# Patient Record
Sex: Male | Born: 1940 | Race: Black or African American | Hispanic: No | Marital: Married | Smoking: Former smoker
Health system: Southern US, Community
[De-identification: ages and names within clinical notes are randomized; demographics above are authoritative.]

## PROBLEM LIST (undated history)

## (undated) DIAGNOSIS — K219 Gastro-esophageal reflux disease without esophagitis: Secondary | ICD-10-CM

## (undated) DIAGNOSIS — I2510 Atherosclerotic heart disease of native coronary artery without angina pectoris: Secondary | ICD-10-CM

## (undated) DIAGNOSIS — I493 Ventricular premature depolarization: Secondary | ICD-10-CM

## (undated) DIAGNOSIS — M1990 Unspecified osteoarthritis, unspecified site: Secondary | ICD-10-CM

## (undated) DIAGNOSIS — I10 Essential (primary) hypertension: Secondary | ICD-10-CM

## (undated) DIAGNOSIS — C8300 Small cell B-cell lymphoma, unspecified site: Secondary | ICD-10-CM

## (undated) DIAGNOSIS — I779 Disorder of arteries and arterioles, unspecified: Secondary | ICD-10-CM

## (undated) DIAGNOSIS — J449 Chronic obstructive pulmonary disease, unspecified: Secondary | ICD-10-CM

## (undated) DIAGNOSIS — T7840XA Allergy, unspecified, initial encounter: Secondary | ICD-10-CM

## (undated) DIAGNOSIS — E785 Hyperlipidemia, unspecified: Secondary | ICD-10-CM

## (undated) HISTORY — DX: Chronic obstructive pulmonary disease, unspecified: J44.9

## (undated) HISTORY — DX: Essential (primary) hypertension: I10

## (undated) HISTORY — DX: Small cell B-cell lymphoma, unspecified site: C83.00

## (undated) HISTORY — DX: Allergy, unspecified, initial encounter: T78.40XA

## (undated) HISTORY — DX: Atherosclerotic heart disease of native coronary artery without angina pectoris: I25.10

## (undated) HISTORY — DX: Disorder of arteries and arterioles, unspecified: I77.9

## (undated) HISTORY — PX: CIRCUMCISION: SUR203

## (undated) HISTORY — DX: Hyperlipidemia, unspecified: E78.5

## (undated) HISTORY — DX: Unspecified osteoarthritis, unspecified site: M19.90

## (undated) HISTORY — DX: Ventricular premature depolarization: I49.3

## (undated) HISTORY — PX: HEMORRHOID SURGERY: SHX153

---

## 2000-08-27 ENCOUNTER — Ambulatory Visit (HOSPITAL_COMMUNITY): Admission: RE | Admit: 2000-08-27 | Discharge: 2000-08-27 | Payer: Self-pay | Admitting: Cardiology

## 2000-08-27 ENCOUNTER — Encounter: Payer: Self-pay | Admitting: Cardiology

## 2000-10-02 ENCOUNTER — Ambulatory Visit (HOSPITAL_COMMUNITY): Admission: RE | Admit: 2000-10-02 | Discharge: 2000-10-02 | Payer: Self-pay | Admitting: Endocrinology

## 2000-10-02 ENCOUNTER — Encounter: Payer: Self-pay | Admitting: Endocrinology

## 2003-04-21 ENCOUNTER — Ambulatory Visit (HOSPITAL_COMMUNITY): Admission: RE | Admit: 2003-04-21 | Discharge: 2003-04-21 | Payer: Self-pay | Admitting: Cardiology

## 2003-09-08 ENCOUNTER — Ambulatory Visit (HOSPITAL_COMMUNITY): Admission: RE | Admit: 2003-09-08 | Discharge: 2003-09-08 | Payer: Self-pay

## 2005-07-04 ENCOUNTER — Ambulatory Visit (HOSPITAL_COMMUNITY): Admission: RE | Admit: 2005-07-04 | Discharge: 2005-07-04 | Payer: Self-pay | Admitting: *Deleted

## 2010-04-13 ENCOUNTER — Ambulatory Visit (HOSPITAL_BASED_OUTPATIENT_CLINIC_OR_DEPARTMENT_OTHER)
Admission: RE | Admit: 2010-04-13 | Discharge: 2010-04-13 | Disposition: A | Discharge status: Home / Self Care | Payer: Medicare Other | Source: Ambulatory Visit | Attending: Orthopedic Surgery | Admitting: Orthopedic Surgery

## 2010-04-13 DIAGNOSIS — J45909 Unspecified asthma, uncomplicated: Secondary | ICD-10-CM | POA: Insufficient documentation

## 2010-04-13 DIAGNOSIS — Z01810 Encounter for preprocedural cardiovascular examination: Secondary | ICD-10-CM | POA: Insufficient documentation

## 2010-04-13 DIAGNOSIS — IMO0002 Reserved for concepts with insufficient information to code with codable children: Secondary | ICD-10-CM | POA: Insufficient documentation

## 2010-04-13 DIAGNOSIS — Z01812 Encounter for preprocedural laboratory examination: Secondary | ICD-10-CM | POA: Insufficient documentation

## 2010-04-13 DIAGNOSIS — X58XXXA Exposure to other specified factors, initial encounter: Secondary | ICD-10-CM | POA: Insufficient documentation

## 2010-04-13 LAB — POCT I-STAT 4, (NA,K, GLUC, HGB,HCT)
Glucose, Bld: 114 mg/dL — ABNORMAL HIGH (ref 70–99)
HCT: 42 % (ref 39.0–52.0)
Hemoglobin: 14.3 g/dL (ref 13.0–17.0)
Potassium: 3.9 mEq/L (ref 3.5–5.1)
Sodium: 139 mEq/L (ref 135–145)

## 2010-04-20 NOTE — Op Note (Signed)
  NAMECARNIE, BRUEMMER             ACCOUNT NO.:  [id number]  MEDICAL RECORD NO.:  [id number]          PATIENT TYPE:  LOCATION:                                 FACILITY:  PHYSICIAN:  Ollen Gross, M.D.         DATE OF BIRTH:  DATE OF PROCEDURE:  04/13/2010 DATE OF DISCHARGE:                              OPERATIVE REPORT   PREOPERATIVE DIAGNOSIS:  Left knee medial meniscal tear.  POSTOPERATIVE DIAGNOSIS:  Left knee medial meniscal tear.  PROCEDURE:  Left knee arthroscopy with meniscal debridement.  SURGEON:  Ollen Gross, MD  ASSISTANT:  None.  ANESTHESIA:  General.  ESTIMATED BLOOD LOSS:  Minimal.  DRAINS:  None.  COMPLICATIONS:  None.  CONDITION:  Stable to recovery.  BRIEF CLINICAL NOTE:  Mr. Joseph Moyer is a 70 year old male with several- month history of significant left knee pain and mechanical symptoms. Exam and history suggested medial meniscal tear, confirmed by MRI.  He presents now for arthroscopy and debridement.  PROCEDURE IN DETAIL:  After successful administration of general anesthetic, a tourniquet was placed high on the left thigh and left lower extremity was prepped and draped in usual sterile fashion. Standard superomedial inferolateral incisions were made, inflow cannula passed, superomedial camera passed inferolateral.  Arthroscopic visualization proceeds.  The undersurface of the patella and trochlea showed some grade 1 to 2 changes, but no full-thickness cartilage defects and no evidence of any unstable cartilage.  Mediolateral gutters were visualized and no loose bodies.  Flexion valgus force was applied to knee and the medial compartment __________.  There was evidence of a significant tear in the body and posterior horn of the medial meniscus. There was some low-grade chondromalacia in the medial femoral condyle and no full-thickness defects and no unstable cartilage.  Spinal needle was used to localize the inferomedial portal.  Small  incision was made and dilator placed.  The medial meniscal tear was then debrided back to stable base with baskets and 4.2 mm shaver.  It was then sealed off with the ArthroCare device.  It was found to be stable.  Intercondylar notch was then visualized.  The ACL was intact.  Lateral compartment was entered.  Minimal chondromalacia and no tears.  The arthroscopic equipment was then used to inspect the rest of the joint.  No other tears, defects or loose bodies were noted.  He comes in the room for removal of the inferior portals, which were closed with interrupted 4-0 nylon.  A 20 cc of 0.25% Marcaine with epi was injected through the inflow cannula and then that was removed and that portal closed with nylon.  Incisions were cleaned and dried and a bulky sterile dressing applied.  He was then awakened and transported to Recovery in stable condition.     Ollen Gross, M.D.     FA/MEDQ  D:  04/13/2010  T:  04/13/2010  Job:  [id number]  Electronically Signed by Ollen Gross M.D. on 04/20/2010 08:18:13 AM

## 2011-01-04 ENCOUNTER — Ambulatory Visit (INDEPENDENT_AMBULATORY_CARE_PROVIDER_SITE_OTHER): Payer: Medicare Other

## 2011-01-04 DIAGNOSIS — N529 Male erectile dysfunction, unspecified: Secondary | ICD-10-CM

## 2011-01-04 DIAGNOSIS — D1739 Benign lipomatous neoplasm of skin and subcutaneous tissue of other sites: Secondary | ICD-10-CM

## 2011-01-04 DIAGNOSIS — R5381 Other malaise: Secondary | ICD-10-CM

## 2013-01-17 ENCOUNTER — Encounter (INDEPENDENT_AMBULATORY_CARE_PROVIDER_SITE_OTHER): Payer: Medicare Other | Admitting: Ophthalmology

## 2013-01-17 DIAGNOSIS — H43819 Vitreous degeneration, unspecified eye: Secondary | ICD-10-CM

## 2013-01-17 DIAGNOSIS — H2510 Age-related nuclear cataract, unspecified eye: Secondary | ICD-10-CM

## 2013-01-17 DIAGNOSIS — H35039 Hypertensive retinopathy, unspecified eye: Secondary | ICD-10-CM

## 2013-01-17 DIAGNOSIS — I10 Essential (primary) hypertension: Secondary | ICD-10-CM

## 2013-01-17 DIAGNOSIS — H35379 Puckering of macula, unspecified eye: Secondary | ICD-10-CM

## 2013-02-28 ENCOUNTER — Encounter (INDEPENDENT_AMBULATORY_CARE_PROVIDER_SITE_OTHER): Payer: Commercial Managed Care - HMO | Admitting: Ophthalmology

## 2013-03-06 ENCOUNTER — Encounter (INDEPENDENT_AMBULATORY_CARE_PROVIDER_SITE_OTHER): Payer: Medicare HMO | Admitting: Ophthalmology

## 2013-03-06 DIAGNOSIS — H43819 Vitreous degeneration, unspecified eye: Secondary | ICD-10-CM

## 2013-03-06 DIAGNOSIS — H4310 Vitreous hemorrhage, unspecified eye: Secondary | ICD-10-CM

## 2013-03-06 DIAGNOSIS — H2510 Age-related nuclear cataract, unspecified eye: Secondary | ICD-10-CM

## 2013-03-06 DIAGNOSIS — I10 Essential (primary) hypertension: Secondary | ICD-10-CM

## 2013-03-06 DIAGNOSIS — H35039 Hypertensive retinopathy, unspecified eye: Secondary | ICD-10-CM

## 2013-03-06 DIAGNOSIS — H35379 Puckering of macula, unspecified eye: Secondary | ICD-10-CM

## 2013-04-17 ENCOUNTER — Encounter (INDEPENDENT_AMBULATORY_CARE_PROVIDER_SITE_OTHER): Payer: Medicare HMO | Admitting: Ophthalmology

## 2013-04-17 DIAGNOSIS — I10 Essential (primary) hypertension: Secondary | ICD-10-CM

## 2013-04-17 DIAGNOSIS — H2510 Age-related nuclear cataract, unspecified eye: Secondary | ICD-10-CM

## 2013-04-17 DIAGNOSIS — H43819 Vitreous degeneration, unspecified eye: Secondary | ICD-10-CM

## 2013-04-17 DIAGNOSIS — H35039 Hypertensive retinopathy, unspecified eye: Secondary | ICD-10-CM

## 2013-04-17 DIAGNOSIS — H35379 Puckering of macula, unspecified eye: Secondary | ICD-10-CM

## 2014-01-07 ENCOUNTER — Other Ambulatory Visit: Payer: Self-pay | Admitting: Nurse Practitioner

## 2014-01-07 ENCOUNTER — Ambulatory Visit
Admission: RE | Admit: 2014-01-07 | Discharge: 2014-01-07 | Disposition: A | Discharge status: Home / Self Care | Payer: Commercial Managed Care - HMO | Source: Ambulatory Visit | Attending: Nurse Practitioner | Admitting: Nurse Practitioner

## 2014-01-07 DIAGNOSIS — M79605 Pain in left leg: Secondary | ICD-10-CM

## 2014-01-07 DIAGNOSIS — M79604 Pain in right leg: Secondary | ICD-10-CM

## 2014-01-07 DIAGNOSIS — M545 Low back pain, unspecified: Secondary | ICD-10-CM

## 2014-04-22 ENCOUNTER — Ambulatory Visit (INDEPENDENT_AMBULATORY_CARE_PROVIDER_SITE_OTHER): Payer: Commercial Managed Care - HMO | Admitting: Ophthalmology

## 2014-05-08 DIAGNOSIS — E291 Testicular hypofunction: Secondary | ICD-10-CM | POA: Diagnosis not present

## 2014-05-08 DIAGNOSIS — Z1389 Encounter for screening for other disorder: Secondary | ICD-10-CM | POA: Diagnosis not present

## 2014-05-08 DIAGNOSIS — Z0000 Encounter for general adult medical examination without abnormal findings: Secondary | ICD-10-CM | POA: Diagnosis not present

## 2014-05-08 DIAGNOSIS — E780 Pure hypercholesterolemia: Secondary | ICD-10-CM | POA: Diagnosis not present

## 2014-05-08 DIAGNOSIS — I10 Essential (primary) hypertension: Secondary | ICD-10-CM | POA: Diagnosis not present

## 2014-05-08 DIAGNOSIS — Z125 Encounter for screening for malignant neoplasm of prostate: Secondary | ICD-10-CM | POA: Diagnosis not present

## 2014-06-15 ENCOUNTER — Ambulatory Visit (INDEPENDENT_AMBULATORY_CARE_PROVIDER_SITE_OTHER): Payer: Medicare HMO

## 2014-06-15 ENCOUNTER — Ambulatory Visit (INDEPENDENT_AMBULATORY_CARE_PROVIDER_SITE_OTHER): Payer: Commercial Managed Care - HMO | Admitting: Emergency Medicine

## 2014-06-15 VITALS — BP 142/88 | HR 81 | Temp 97.7°F | Resp 15 | Ht 71.25 in | Wt 207.0 lb

## 2014-06-15 DIAGNOSIS — M25561 Pain in right knee: Secondary | ICD-10-CM | POA: Diagnosis not present

## 2014-06-15 DIAGNOSIS — M25511 Pain in right shoulder: Secondary | ICD-10-CM | POA: Diagnosis not present

## 2014-06-15 DIAGNOSIS — M25512 Pain in left shoulder: Secondary | ICD-10-CM

## 2014-06-15 DIAGNOSIS — M25562 Pain in left knee: Secondary | ICD-10-CM

## 2014-06-15 MED ORDER — TRAMADOL HCL 50 MG PO TABS: 50.0000 mg | ORAL_TABLET | Freq: Three times a day (TID) | ORAL | Status: DC | PRN

## 2014-06-15 NOTE — Progress Notes (Addendum)
   Subjective:  This chart was scribed for Joseph Jordan, MD by [facility], medical scribe at [facility].The patient was seen in exam room 09 and the patient's care was started at 11:12 AM.   Patient ID: Joseph Moyer, male    DOB: 09-Nov-1940, 74 y.o.   MRN: [id number] Chief Complaint  Patient presents with  . Knee Pain    Bilateral. Pt states he has fluid in his knees and wants it drained   HPI HPI Comments: FERAS GARDELLA is a 74 y.o. male who presents to [facility] complaining of bilateral persistent knee pain. Pt states he is unable to stand for long periods of time due to the pain. He would like his knee drained since this was helpful in the past about 5-6 years ago. He has back pain that radiates down to his legs. Seeing Dr. Jimmye Norman a Chiropractor. On 3/14.2012 pt had a left knee operation for a torn meniscus. He also complains of right shoulder pain, this has been ongoing for "years".  Review of Systems  Musculoskeletal: Positive for back pain, joint swelling, arthralgias and gait problem.      Objective:  BP 142/88 mmHg  Pulse 81  Temp(Src) 97.7 F (36.5 C) (Oral)  Resp 15  Ht 5' 11.25" (1.81 m)  Wt 207 lb (93.895 kg)  BMI 28.66 kg/m2  SpO2 98% Physical Exam  Nursing note and vitals reviewed. CONSTITUTIONAL: Well developed/well nourished HEAD: Normocephalic/atraumatic EYES: EOMI/PERRL ENMT: Mucous membranes moist NECK: supple no meningeal signs SPINE/BACK:entire spine nontender CV: S1/S2 noted, no murmurs/rubs/gallops noted LUNGS: Lungs are clear to auscultation bilaterally, no apparent distress ABDOMEN: soft, nontender, no rebound or guarding, bowel sounds noted throughout abdomen GU:no cva tenderness NEURO: Pt is awake/alert/appropriate, moves all extremitiesx4.  No facial droop.   EXTREMITIES: pulses normal/equal, full ROM. Degenerative changes of both knees. Right shoulder full ROM no AC joint tenderness , weakness  to rotator cuff testing.  SKIN: warm, color normal PSYCH: no abnormalities of mood noted, alert and oriented to situation  [facility] reading (PRIMARY) by  Dr. Everlene Farrier right knee mild arthritic change left knee loss of the medial cartilage right shoulder normal left shoulder normal      Assessment & Plan:   We'll treat with glucosamine/chondroitin. This will be safer. He will continue with chiropractic care.I personally performed the services described in this documentation, which was scribed in my presence. The recorded information has been reviewed and is accurate.  Joseph Jordan, MD

## 2014-06-15 NOTE — Patient Instructions (Signed)

## 2014-07-06 DIAGNOSIS — M549 Dorsalgia, unspecified: Secondary | ICD-10-CM | POA: Diagnosis not present

## 2014-07-13 ENCOUNTER — Other Ambulatory Visit: Payer: Self-pay | Admitting: Internal Medicine

## 2014-07-13 DIAGNOSIS — M549 Dorsalgia, unspecified: Secondary | ICD-10-CM

## 2014-07-24 ENCOUNTER — Ambulatory Visit
Admission: RE | Admit: 2014-07-24 | Discharge: 2014-07-24 | Disposition: A | Discharge status: Home / Self Care | Payer: Commercial Managed Care - HMO | Source: Ambulatory Visit | Attending: Internal Medicine | Admitting: Internal Medicine

## 2014-07-24 DIAGNOSIS — M4806 Spinal stenosis, lumbar region: Secondary | ICD-10-CM | POA: Diagnosis not present

## 2014-07-24 DIAGNOSIS — M5126 Other intervertebral disc displacement, lumbar region: Secondary | ICD-10-CM | POA: Diagnosis not present

## 2014-07-24 DIAGNOSIS — M47817 Spondylosis without myelopathy or radiculopathy, lumbosacral region: Secondary | ICD-10-CM | POA: Diagnosis not present

## 2014-07-24 DIAGNOSIS — M549 Dorsalgia, unspecified: Secondary | ICD-10-CM

## 2014-07-24 DIAGNOSIS — M5137 Other intervertebral disc degeneration, lumbosacral region: Secondary | ICD-10-CM | POA: Diagnosis not present

## 2014-07-30 ENCOUNTER — Ambulatory Visit (INDEPENDENT_AMBULATORY_CARE_PROVIDER_SITE_OTHER): Payer: Commercial Managed Care - HMO

## 2014-07-30 ENCOUNTER — Inpatient Hospital Stay (HOSPITAL_COMMUNITY)
Admission: EM | Admit: 2014-07-30 | Discharge: 2014-08-03 | DRG: 392 | Disposition: A | Discharge status: Home / Self Care | Payer: Commercial Managed Care - HMO | Attending: Internal Medicine | Admitting: Internal Medicine

## 2014-07-30 ENCOUNTER — Encounter: Payer: Self-pay | Admitting: Emergency Medicine

## 2014-07-30 ENCOUNTER — Ambulatory Visit (INDEPENDENT_AMBULATORY_CARE_PROVIDER_SITE_OTHER): Payer: Commercial Managed Care - HMO | Admitting: Emergency Medicine

## 2014-07-30 ENCOUNTER — Emergency Department (HOSPITAL_COMMUNITY): Payer: Commercial Managed Care - HMO

## 2014-07-30 ENCOUNTER — Encounter (HOSPITAL_COMMUNITY): Payer: Self-pay | Admitting: Emergency Medicine

## 2014-07-30 VITALS — BP 144/88 | HR 68 | Temp 98.4°F | Resp 16 | Wt 199.8 lb

## 2014-07-30 DIAGNOSIS — Z6827 Body mass index (BMI) 27.0-27.9, adult: Secondary | ICD-10-CM | POA: Diagnosis not present

## 2014-07-30 DIAGNOSIS — IMO0001 Reserved for inherently not codable concepts without codable children: Secondary | ICD-10-CM | POA: Insufficient documentation

## 2014-07-30 DIAGNOSIS — K5720 Diverticulitis of large intestine with perforation and abscess without bleeding: Principal | ICD-10-CM | POA: Diagnosis present

## 2014-07-30 DIAGNOSIS — Z79899 Other long term (current) drug therapy: Secondary | ICD-10-CM | POA: Diagnosis not present

## 2014-07-30 DIAGNOSIS — K5900 Constipation, unspecified: Secondary | ICD-10-CM | POA: Diagnosis not present

## 2014-07-30 DIAGNOSIS — K5780 Diverticulitis of intestine, part unspecified, with perforation and abscess without bleeding: Secondary | ICD-10-CM | POA: Diagnosis not present

## 2014-07-30 DIAGNOSIS — R140 Abdominal distension (gaseous): Secondary | ICD-10-CM | POA: Diagnosis not present

## 2014-07-30 DIAGNOSIS — R030 Elevated blood-pressure reading, without diagnosis of hypertension: Secondary | ICD-10-CM

## 2014-07-30 DIAGNOSIS — E663 Overweight: Secondary | ICD-10-CM | POA: Diagnosis present

## 2014-07-30 DIAGNOSIS — G479 Sleep disorder, unspecified: Secondary | ICD-10-CM | POA: Diagnosis present

## 2014-07-30 DIAGNOSIS — E860 Dehydration: Secondary | ICD-10-CM | POA: Diagnosis present

## 2014-07-30 DIAGNOSIS — Z833 Family history of diabetes mellitus: Secondary | ICD-10-CM

## 2014-07-30 DIAGNOSIS — K921 Melena: Secondary | ICD-10-CM | POA: Diagnosis not present

## 2014-07-30 DIAGNOSIS — E871 Hypo-osmolality and hyponatremia: Secondary | ICD-10-CM | POA: Diagnosis not present

## 2014-07-30 DIAGNOSIS — R1012 Left upper quadrant pain: Secondary | ICD-10-CM

## 2014-07-30 DIAGNOSIS — E785 Hyperlipidemia, unspecified: Secondary | ICD-10-CM | POA: Diagnosis not present

## 2014-07-30 DIAGNOSIS — K5721 Diverticulitis of large intestine with perforation and abscess with bleeding: Secondary | ICD-10-CM | POA: Diagnosis not present

## 2014-07-30 DIAGNOSIS — Z7982 Long term (current) use of aspirin: Secondary | ICD-10-CM | POA: Diagnosis not present

## 2014-07-30 DIAGNOSIS — I10 Essential (primary) hypertension: Secondary | ICD-10-CM | POA: Diagnosis present

## 2014-07-30 DIAGNOSIS — K5792 Diverticulitis of intestine, part unspecified, without perforation or abscess without bleeding: Secondary | ICD-10-CM | POA: Diagnosis present

## 2014-07-30 DIAGNOSIS — K8020 Calculus of gallbladder without cholecystitis without obstruction: Secondary | ICD-10-CM | POA: Diagnosis not present

## 2014-07-30 DIAGNOSIS — R109 Unspecified abdominal pain: Secondary | ICD-10-CM | POA: Diagnosis present

## 2014-07-30 LAB — BASIC METABOLIC PANEL
Anion gap: 12 (ref 5–15)
BUN: 19 mg/dL (ref 6–20)
CO2: 27 mmol/L (ref 22–32)
CREATININE: 0.88 mg/dL (ref 0.61–1.24)
Calcium: 8.9 mg/dL (ref 8.9–10.3)
Chloride: 95 mmol/L — ABNORMAL LOW (ref 101–111)
GFR calc Af Amer: >60 mL/min (ref >60)
GFR calc non Af Amer: >60 mL/min (ref >60)
Glucose, Bld: 94 mg/dL (ref 65–99)
POTASSIUM: 3.9 mmol/L (ref 3.5–5.1)
Sodium: 134 mmol/L — ABNORMAL LOW (ref 135–145)

## 2014-07-30 LAB — CBC
HCT: 34.5 % — ABNORMAL LOW (ref 39.0–52.0)
Hemoglobin: 11.4 g/dL — ABNORMAL LOW (ref 13.0–17.0)
MCH: 28.1 pg (ref 26.0–34.0)
MCHC: 33 g/dL (ref 30.0–36.0)
MCV: 85.2 fL (ref 78.0–100.0)
PLATELETS: 196 10*3/uL (ref 150–400)
RBC: 4.05 MIL/uL — AB (ref 4.22–5.81)
RDW: 15.6 % — ABNORMAL HIGH (ref 11.5–15.5)
WBC: 8 10*3/uL (ref 4.0–10.5)

## 2014-07-30 LAB — HEPATIC FUNCTION PANEL
ALBUMIN: 3.6 g/dL (ref 3.5–5.0)
ALT: 95 U/L — AB (ref 17–63)
AST: 46 U/L — ABNORMAL HIGH (ref 15–41)
Alkaline Phosphatase: 128 U/L — ABNORMAL HIGH (ref 38–126)
BILIRUBIN INDIRECT: 0.5 mg/dL (ref 0.3–0.9)
Bilirubin, Direct: 0.1 mg/dL (ref 0.1–0.5)
Total Bilirubin: 0.6 mg/dL (ref 0.3–1.2)
Total Protein: 8.9 g/dL — ABNORMAL HIGH (ref 6.5–8.1)

## 2014-07-30 LAB — URINALYSIS, ROUTINE W REFLEX MICROSCOPIC
Glucose, UA: NEGATIVE mg/dL
Hgb urine dipstick: NEGATIVE
Ketones, ur: NEGATIVE mg/dL
Leukocytes, UA: NEGATIVE
NITRITE: NEGATIVE
Protein, ur: 30 mg/dL — AB
Specific Gravity, Urine: 1.028 (ref 1.005–1.030)
Urobilinogen, UA: 1 mg/dL (ref 0.0–1.0)
pH: 6 (ref 5.0–8.0)

## 2014-07-30 LAB — LIPASE, BLOOD: LIPASE: 15 U/L — AB (ref 22–51)

## 2014-07-30 LAB — POCT CBC
Granulocyte percent: 75.6 %G (ref 37–80)
HCT, POC: 38.9 % — AB (ref 43.5–53.7)
Hemoglobin: 12.5 g/dL — AB (ref 14.1–18.1)
LYMPH, POC: 2 (ref 0.6–3.4)
MCH: 27.3 pg (ref 27–31.2)
MCHC: 32.1 g/dL (ref 31.8–35.4)
MCV: 85.2 fL (ref 80–97)
MID (cbc): 0.3 (ref 0–0.9)
MPV: 7.3 fL (ref 0–99.8)
PLATELET COUNT, POC: 242 10*3/uL (ref 142–424)
POC Granulocyte: 7.1 — AB (ref 2–6.9)
POC LYMPH %: 21.5 % (ref 10–50)
POC MID %: 2.9 %M (ref 0–12)
RBC: 4.56 M/uL — AB (ref 4.69–6.13)
RDW, POC: 16.5 %
WBC: 9.4 10*3/uL (ref 4.6–10.2)

## 2014-07-30 LAB — CREATININE, SERUM
CREATININE: 0.83 mg/dL (ref 0.61–1.24)
GFR calc Af Amer: >60 mL/min (ref >60)
GFR calc non Af Amer: >60 mL/min (ref >60)

## 2014-07-30 LAB — POC HEMOCCULT BLD/STL (OFFICE/1-CARD/DIAGNOSTIC)
FECAL OCCULT BLD: NEGATIVE
OCCULT BLOOD DATE: 6302016

## 2014-07-30 LAB — URINE MICROSCOPIC-ADD ON

## 2014-07-30 MED ORDER — MORPHINE SULFATE 2 MG/ML IJ SOLN
1.0000 mg | INTRAMUSCULAR | Status: DC | PRN
  Administered 2014-08-01 – 2014-08-02 (×2): 1 mg via INTRAVENOUS
  Filled 2014-07-30 (×2): qty 1

## 2014-07-30 MED ORDER — ACETAMINOPHEN 325 MG PO TABS
650.0000 mg | ORAL_TABLET | Freq: Four times a day (QID) | ORAL | Status: DC | PRN
Start: 2014-07-30 — End: 2014-08-03

## 2014-07-30 MED ORDER — ACETAMINOPHEN 650 MG RE SUPP
650.0000 mg | Freq: Four times a day (QID) | RECTAL | Status: DC | PRN
Start: 2014-07-30 — End: 2014-08-03

## 2014-07-30 MED ORDER — SIMVASTATIN 20 MG PO TABS
20.0000 mg | ORAL_TABLET | Freq: Every day | ORAL | Status: DC
  Administered 2014-07-31 – 2014-08-03 (×4): 20 mg via ORAL
  Filled 2014-07-30 (×4): qty 1

## 2014-07-30 MED ORDER — ONDANSETRON HCL 4 MG/2ML IJ SOLN: 4.0000 mg | Freq: Four times a day (QID) | INTRAMUSCULAR | Status: DC | PRN

## 2014-07-30 MED ORDER — MORPHINE SULFATE 4 MG/ML IJ SOLN
4.0000 mg | Freq: Once | INTRAMUSCULAR | Status: AC
  Administered 2014-07-30: 4 mg via INTRAVENOUS
  Filled 2014-07-30: qty 1

## 2014-07-30 MED ORDER — ONDANSETRON HCL 4 MG PO TABS: 4.0000 mg | ORAL_TABLET | Freq: Four times a day (QID) | ORAL | Status: DC | PRN

## 2014-07-30 MED ORDER — IOHEXOL 300 MG/ML  SOLN
25.0000 mL | Freq: Once | INTRAMUSCULAR | Status: AC | PRN
  Administered 2014-07-30: 25 mL via ORAL

## 2014-07-30 MED ORDER — ONDANSETRON HCL 4 MG/2ML IJ SOLN
4.0000 mg | Freq: Once | INTRAMUSCULAR | Status: AC
  Administered 2014-07-30: 4 mg via INTRAVENOUS
  Filled 2014-07-30: qty 2

## 2014-07-30 MED ORDER — ASPIRIN EC 81 MG PO TBEC
81.0000 mg | DELAYED_RELEASE_TABLET | Freq: Every day | ORAL | Status: DC
  Administered 2014-07-31 – 2014-08-03 (×4): 81 mg via ORAL
  Filled 2014-07-30 (×4): qty 1

## 2014-07-30 MED ORDER — CIPROFLOXACIN IN D5W 400 MG/200ML IV SOLN
400.0000 mg | Freq: Two times a day (BID) | INTRAVENOUS | Status: DC
  Administered 2014-07-31 – 2014-08-01 (×4): 400 mg via INTRAVENOUS
  Filled 2014-07-30 (×4): qty 200

## 2014-07-30 MED ORDER — SODIUM CHLORIDE 0.9 % IV SOLN: INTRAVENOUS | Status: DC

## 2014-07-30 MED ORDER — LOSARTAN POTASSIUM 25 MG PO TABS
25.0000 mg | ORAL_TABLET | Freq: Every day | ORAL | Status: DC
  Administered 2014-07-31 – 2014-08-03 (×4): 25 mg via ORAL
  Filled 2014-07-30 (×4): qty 1

## 2014-07-30 MED ORDER — IOHEXOL 300 MG/ML  SOLN
100.0000 mL | Freq: Once | INTRAMUSCULAR | Status: AC | PRN
  Administered 2014-07-30: 100 mL via INTRAVENOUS

## 2014-07-30 MED ORDER — DEXTROSE 5 % IV SOLN
1.0000 g | Freq: Once | INTRAVENOUS | Status: AC
  Administered 2014-07-30: 1 g via INTRAVENOUS
  Filled 2014-07-30: qty 10

## 2014-07-30 MED ORDER — METRONIDAZOLE IN NACL 5-0.79 MG/ML-% IV SOLN
500.0000 mg | Freq: Once | INTRAVENOUS | Status: DC
  Administered 2014-07-30: 500 mg via INTRAVENOUS
  Filled 2014-07-30: qty 100

## 2014-07-30 MED ORDER — METRONIDAZOLE IN NACL 5-0.79 MG/ML-% IV SOLN
500.0000 mg | Freq: Three times a day (TID) | INTRAVENOUS | Status: DC
Start: 2014-07-31 — End: 2014-08-01
  Administered 2014-07-31 – 2014-08-01 (×4): 500 mg via INTRAVENOUS
  Filled 2014-07-30 (×5): qty 100

## 2014-07-30 MED ORDER — HEPARIN SODIUM (PORCINE) 5000 UNIT/ML IJ SOLN
5000.0000 [IU] | Freq: Three times a day (TID) | INTRAMUSCULAR | Status: DC
  Administered 2014-07-31 – 2014-08-03 (×10): 5000 [IU] via SUBCUTANEOUS
  Filled 2014-07-30 (×13): qty 1

## 2014-07-30 NOTE — ED Provider Notes (Signed)
CSN: [id number]     Arrival date & time 07/30/14  1444 History   First MD Initiated Contact with Patient 07/30/14 1610     Chief Complaint  Patient presents with  . Abdominal Pain     Patient is a 74 y.o. male presenting with abdominal pain. The history is provided by the patient.  Abdominal Pain Pain location:  LLQ Pain quality: aching   Pain severity:  Moderate Onset quality:  Gradual Duration:  5 days Timing:  Intermittent Progression:  Worsening Chronicity:  New Relieved by:  Nothing Worsened by:  Palpation Associated symptoms: no dysuria, no fever and no vomiting   Risk factors: has not had multiple surgeries   pt sent by PCP for evaluation and possible CT imaging   Past Medical History  Diagnosis Date  . Allergy   . Arthritis    History reviewed. No pertinent past surgical history. Family History  Problem Relation Age of Onset  . Diabetes Mother   . Diabetes Sister   . Diabetes Brother    History  Substance Use Topics  . Smoking status: Never Smoker   . Smokeless tobacco: Not on file  . Alcohol Use: Not on file    Review of Systems  Constitutional: Negative for fever.  Gastrointestinal: Positive for abdominal pain. Negative for vomiting.  Genitourinary: Negative for dysuria.  All other systems reviewed and are negative.     Allergies  Review of patient's allergies indicates no known allergies.  Home Medications   Prior to Admission medications   Medication Sig Start Date End Date Taking? Authorizing Provider  aspirin 81 MG tablet Take 81 mg by mouth daily.   Yes Historical Provider, MD  losartan (COZAAR) 25 MG tablet Take 25 mg by mouth daily. 06/18/14  Yes Historical Provider, MD  simvastatin (ZOCOR) 20 MG tablet Take 20 mg by mouth daily.   Yes Historical Provider, MD  traMADol (ULTRAM) 50 MG tablet Take 1 tablet (50 mg total) by mouth every 8 (eight) hours as needed. Patient not taking: Reported on 07/30/2014 06/15/14   Darlyne Russian, MD   BP  164/88 mmHg  Pulse 103  Temp(Src) 97.9 F (36.6 C) (Oral)  Resp 19  SpO2 99% Physical Exam CONSTITUTIONAL: Well developed/well nourished HEAD: Normocephalic/atraumatic EYES: EOMI/PERRL ENMT: Mucous membranes moist NECK: supple no meningeal signs SPINE/BACK:entire spine nontender CV: S1/S2 noted, no murmurs/rubs/gallops noted LUNGS: Lungs are clear to auscultation bilaterally, no apparent distress ABDOMEN: soft, mild LLQ tenderness, no rebound or guarding, bowel sounds noted throughout abdomen, no RUQ tenderness GU:no cva tenderness NEURO: Pt is awake/alert/appropriate, moves all extremitiesx4.  No facial droop.   EXTREMITIES: pulses normal/equal, full ROM SKIN: warm, color normal PSYCH: no abnormalities of mood noted, alert and oriented to situation  ED Course  Procedures  4:41 PM Pt sent by PCP For evaluation of abd pain Xray today showed possible gallstone/kidney stone on right.  However pain on left side He already had negative stool hemoccult and CBC Will add on urine/labs 8:31 PM Discussed CT findings with dr Lucia Gaskins Admit to medicine Rocephin/flagyl ordered per previous surgery recs D/w dr Humphrey Rolls will admit to medicine BP 149/78 mmHg  Pulse 97  Temp(Src) 97.9 F (36.6 C) (Oral)  Resp 18  SpO2 97%   Labs Review Labs Reviewed  BASIC METABOLIC PANEL - Abnormal; Notable for the following:    Sodium 134 (*)    Chloride 95 (*)    All other components within normal limits  URINALYSIS, ROUTINE W REFLEX  MICROSCOPIC (NOT AT [facility]) - Abnormal; Notable for the following:    Color, Urine AMBER (*)    Bilirubin Urine SMALL (*)    Protein, ur 30 (*)    All other components within normal limits  HEPATIC FUNCTION PANEL - Abnormal; Notable for the following:    Total Protein 8.9 (*)    AST 46 (*)    ALT 95 (*)    Alkaline Phosphatase 128 (*)    All other components within normal limits  LIPASE, BLOOD - Abnormal; Notable for the following:    Lipase 15 (*)    All other  components within normal limits  URINE MICROSCOPIC-ADD ON    Imaging Review Ct Abdomen Pelvis W Contrast  07/30/2014   CLINICAL DATA:  Abdominal pain for 5 days with bloating  EXAM: CT ABDOMEN AND PELVIS WITH CONTRAST  TECHNIQUE: Multidetector CT imaging of the abdomen and pelvis was performed using the standard protocol following bolus administration of intravenous contrast.  CONTRAST:  1mL OMNIPAQUE IOHEXOL 300 MG/ML SOLN, 173mL OMNIPAQUE IOHEXOL 300 MG/ML SOLN  COMPARISON:  07/30/14  FINDINGS: Lung bases are well aerated with evidence of mild right basilar infiltrate. No sizable effusion is seen.  The liver, spleen, adrenal glands and pancreas are within normal limits. The gallbladder is well distended and demonstrates a single dependent gallstone without complicating factors. Kidneys are well visualized bilaterally without renal calculi or obstructive changes. Ureters are within normal limits to the urinary bladder which is partially distended.  There are changes of diverticulosis throughout the colon. In the mid descending colon there is evidence of diverticulitis with inflammation of adjacent small bowel loops. There are at least 2 air-fluid collections identified in the left lateral abdomen. One is best seen on image number 55 of series 2 measuring 2.6 x 3.3 cm consistent with a focal abscess. On coronal images it has a somewhat bilobed appearance draped over the adjacent colon. The craniocaudad measurement is approximately 8 cm. A second is noted along the mesenteric border of the: Measuring 3.1 by 2.6 cm. This is best seen on image number 64 of series  The appendix is within normal limits. No other focal abnormality is seen. 2. No significant free air is seen. No other definitive abscess is noted.  IMPRESSION: Diverticulitis with 2 pericolonic abscesses as described. Although not mentioned in the body of the report there are some areas of fluid attenuation within the wall of the colon which may  represent some intramural abscess formation.  Cholelithiasis without complicating factors.  Right basilar infiltrate.   Electronically Signed   By: Inez Catalina M.D.   On: 07/30/2014 19:46   Dg Abd Acute W/chest  07/30/2014   CLINICAL DATA:  Abdominal pain  EXAM: DG ABDOMEN ACUTE W/ 1V CHEST  COMPARISON:  Lumbar spine radiographs 01/07/2014 at [place] at [place]: There is no evidence of dilated bowel loops or free intraperitoneal air. No radiopaque calculi or other significant radiographic abnormality is seen. Heart size and mediastinal contours are within normal limits. Both lungs are clear. Probable gallstone reidentified over the right upper quadrant. Renal calculus could appear similar.  IMPRESSION: Negative abdominal radiographs.  No acute cardiopulmonary disease.   Electronically Signed   By: Conchita Paris M.D.   On: 07/30/2014 14:11     EKG Interpretation None     Medications  cefTRIAXone (ROCEPHIN) 1 g in dextrose 5 % 50 mL IVPB (not administered)  metroNIDAZOLE (FLAGYL) IVPB 500 mg (not administered)  morphine 4 MG/ML injection 4 mg (4 mg Intravenous Given 07/30/14 1703)  ondansetron (ZOFRAN) injection 4 mg (4 mg Intravenous Given 07/30/14 1703)  iohexol (OMNIPAQUE) 300 MG/ML solution 25 mL (25 mLs Oral Contrast Given 07/30/14 1721)  iohexol (OMNIPAQUE) 300 MG/ML solution 100 mL (100 mLs Intravenous Contrast Given 07/30/14 1925)    MDM   Final diagnoses:  Acute diverticulitis    Nursing notes including past medical history and social history reviewed and considered in documentation Labs/vital reviewed myself and considered during evaluation     Ripley Fraise, MD 07/30/14 2032

## 2014-07-30 NOTE — ED Notes (Signed)
Pt referred here for CT scan or ultrasound of abdomen for LLQ pain. Has had decreased appetite and constipation since Friday. Some alleviation of constipation Saturday with Magnesium Citrate. Referring MD thinks there could be gallstones. Did X-ray in office and saw what could be possible stones. Denies N/V/D.

## 2014-07-30 NOTE — H&P (Signed)
[facility] History and Physical  ROCHELL MABIE HAL:[id number] DOB: 06/13/1940 DOA: 07/30/2014  Referring physician: Ripley Fraise, MD PCP: Kandice Hams, MD   Chief Complaint: Abdominal Pain  HPI: URIYAH Moyer is a 74 y.o. male with history of HTN Hyperlipidemia presents with abdominal pain. Patient states that the pain has been going on for about 5-6 days. Patient has been noted to be constipated. He has no nausea and no vomiting. He denies diarrhea. Patient states that he has no blood in his stools. Patient denies fevers or chills at this time. Patient states that he has no chest pain. The abdominal pain is in the lower quadrants and does not radiate any where else. He has noted the pain to be intermittent.   Review of Systems:  Systems reviewed and unremarkable other than noted in HPI   Past Medical History  Diagnosis Date  . Allergy   . Arthritis    History reviewed. No pertinent past surgical history. Social History:  reports that he has never smoked. He does not have any smokeless tobacco history on file. His alcohol and drug histories are not on file.  No Known Allergies  Family History  Problem Relation Age of Onset  . Diabetes Mother   . Diabetes Sister   . Diabetes Brother      Prior to Admission medications   Medication Sig Start Date End Date Taking? Authorizing Provider  aspirin 81 MG tablet Take 81 mg by mouth daily.   Yes Historical Provider, MD  losartan (COZAAR) 25 MG tablet Take 25 mg by mouth daily. 06/18/14  Yes Historical Provider, MD  simvastatin (ZOCOR) 20 MG tablet Take 20 mg by mouth daily.   Yes Historical Provider, MD  traMADol (ULTRAM) 50 MG tablet Take 1 tablet (50 mg total) by mouth every 8 (eight) hours as needed. Patient not taking: Reported on 07/30/2014 06/15/14   Darlyne Russian, MD   Physical Exam: Filed Vitals:   07/30/14 1458 07/30/14 1844  BP: 164/88 149/78  Pulse: 103 97  Temp: 97.9 F (36.6 C)   TempSrc: Oral     Resp: 19 18  SpO2: 99% 97%    Wt Readings from Last 3 Encounters:  07/30/14 90.629 kg (199 lb 12.8 oz)  06/15/14 93.895 kg (207 lb)    General:  Appears calm and comfortable Eyes: PERRL, normal lids, irises & conjunctiva ENT: grossly normal hearing, lips & tongue Neck: no LAD, masses or thyromegaly Cardiovascular: RRR, no m/r/g. No LE edema. Respiratory: CTA bilaterally, no w/r/r. Normal respiratory effort. Abdomen: soft, +tenderness in the lower quadrants. No rebound noted Skin: no rash or induration seen on limited exam Musculoskeletal: grossly normal tone BUE/BLE Psychiatric: grossly normal mood and affect Neurologic: grossly non-focal.          Labs on Admission:  Basic Metabolic Panel:  Recent Labs Lab 07/30/14 1640  NA 134*  K 3.9  CL 95*  CO2 27  GLUCOSE 94  BUN 19  CREATININE 0.88  CALCIUM 8.9   Liver Function Tests:  Recent Labs Lab 07/30/14 1640  AST 46*  ALT 95*  ALKPHOS 128*  BILITOT 0.6  PROT 8.9*  ALBUMIN 3.6    Recent Labs Lab 07/30/14 1640  LIPASE 15*   No results for input(s): AMMONIA in the last 168 hours. CBC:  Recent Labs Lab 07/30/14 1403  WBC 9.4  HGB 12.5*  HCT 38.9*  MCV 85.2   Cardiac Enzymes: No results for input(s): CKTOTAL, CKMB, CKMBINDEX, TROPONINI in  the last 168 hours.  BNP (last 3 results) No results for input(s): BNP in the last 8760 hours.  ProBNP (last 3 results) No results for input(s): PROBNP in the last 8760 hours.  CBG: No results for input(s): GLUCAP in the last 168 hours.  Radiological Exams on Admission: Ct Abdomen Pelvis W Contrast  07/30/2014   CLINICAL DATA:  Abdominal pain for 5 days with bloating  EXAM: CT ABDOMEN AND PELVIS WITH CONTRAST  TECHNIQUE: Multidetector CT imaging of the abdomen and pelvis was performed using the standard protocol following bolus administration of intravenous contrast.  CONTRAST:  28mL OMNIPAQUE IOHEXOL 300 MG/ML SOLN, 160mL OMNIPAQUE IOHEXOL 300 MG/ML SOLN   COMPARISON:  07/30/14  FINDINGS: Lung bases are well aerated with evidence of mild right basilar infiltrate. No sizable effusion is seen.  The liver, spleen, adrenal glands and pancreas are within normal limits. The gallbladder is well distended and demonstrates a single dependent gallstone without complicating factors. Kidneys are well visualized bilaterally without renal calculi or obstructive changes. Ureters are within normal limits to the urinary bladder which is partially distended.  There are changes of diverticulosis throughout the colon. In the mid descending colon there is evidence of diverticulitis with inflammation of adjacent small bowel loops. There are at least 2 air-fluid collections identified in the left lateral abdomen. One is best seen on image number 55 of series 2 measuring 2.6 x 3.3 cm consistent with a focal abscess. On coronal images it has a somewhat bilobed appearance draped over the adjacent colon. The craniocaudad measurement is approximately 8 cm. A second is noted along the mesenteric border of the: Measuring 3.1 by 2.6 cm. This is best seen on image number 64 of series  The appendix is within normal limits. No other focal abnormality is seen. 2. No significant free air is seen. No other definitive abscess is noted.  IMPRESSION: Diverticulitis with 2 pericolonic abscesses as described. Although not mentioned in the body of the report there are some areas of fluid attenuation within the wall of the colon which may represent some intramural abscess formation.  Cholelithiasis without complicating factors.  Right basilar infiltrate.   Electronically Signed   By: Inez Catalina M.D.   On: 07/30/2014 19:46   Dg Abd Acute W/chest  07/30/2014   CLINICAL DATA:  Abdominal pain  EXAM: DG ABDOMEN ACUTE W/ 1V CHEST  COMPARISON:  Lumbar spine radiographs 01/07/2014 at [place] at [place]: There is no evidence of dilated bowel loops or free intraperitoneal air. No  radiopaque calculi or other significant radiographic abnormality is seen. Heart size and mediastinal contours are within normal limits. Both lungs are clear. Probable gallstone reidentified over the right upper quadrant. Renal calculus could appear similar.  IMPRESSION: Negative abdominal radiographs.  No acute cardiopulmonary disease.   Electronically Signed   By: Conchita Paris M.D.   On: 07/30/2014 14:11      Assessment/Plan Active Problems:   Acute diverticulitis   Hyponatremia   Hyperlipidemia   HTN (hypertension)   1. Acute diverticulitis -will be admitted for observation -will start on IV cipro and Flagyl -surgery will see patient in am  2. Cholelithiasis -will monitor follow up as outpatient  3. Hyperlipidemia -on statins  4. Hypertension -will continue with losartan  5. Hyponatremia -repeat labs in am -started on NS in ER    Code Status: Full Code (must indicate code status--if unknown or must be presumed, indicate so) DVT Prophylaxis:Heparin Family Communication: None (indicate  person spoken with, if applicable, with phone number if by telephone) Disposition Plan: Home (indicate anticipated LOS)  Time spent: 56min  KHAN,SAADAT A [facility] Pager [phone]

## 2014-07-30 NOTE — ED Notes (Signed)
Pt being sent over by Dr Osvaldo Angst. C/o abd pain x5 days, bloating, afebrile. Xray shows calcification in RUQ

## 2014-07-30 NOTE — Progress Notes (Addendum)
Subjective:  This chart was scribed for Arlyss Queen, MD by Leandra Kern, Medical Scribe. This patient was seen in Room 21 and the patient's care was started at 1:17 PM.   Patient ID: Joseph Moyer, male    DOB: February 08, 1940, 74 y.o.   MRN: [id number]  HPI HPI Comments: Joseph Moyer is a 74 y.o. male who presents to [facility] complaining of abdominal pain, gradual onset 6 days ago.  He notes that he had a very big meal to eat, in which he was presented with abdominal pain right after. Pt has associated symptoms of constipation, and decrease in appetite as a result of the pain. He states that he took pepto bismol, which he did not find relief from. He then tried another over the counter medication magcitrate, and after he took this medication he had a bowel movement the next day, his stool very dark in color. Pt denies vomiting. Pt has not had any abdominal surgeries previously. Today, he notes that he had a small bowel movement.    Pt's next colonoscopy appointment is in about a year.    Review of Systems  Constitutional: Positive for appetite change (decrease in appetite).  Gastrointestinal: Positive for abdominal pain and constipation. Negative for vomiting.       Objective:   Physical Exam  Constitutional: He is oriented to person, place, and time. He appears well-developed and well-nourished. No distress.  HENT:  Head: Normocephalic and atraumatic.  Eyes: EOM are normal. Pupils are equal, round, and reactive to light.  Neck: Neck supple.  Cardiovascular: Normal rate.   Pulmonary/Chest: Effort normal.  Abdominal: He exhibits distension.  Decreased bowel sounds. Tenderness in the left upper and left mid abdomen.  Rectal exam was normal Brown stool from acids.   Neurological: He is alert and oriented to person, place, and time. No cranial nerve deficit.  Skin: Skin is warm and dry.  Psychiatric: He has a normal mood and affect. His behavior is  normal.  Nursing note and vitals reviewed. [facility] reading (PRIMARY) by  Dr. Everlene Farrier there is a 1 cm calcification in the right upper abdomen and air-fluid level in the right lower abdomen there is no free air lung fields appear clear with possible increased markings right lower lobe Results for orders placed or performed in visit on 07/30/14  POCT CBC  Result Value Ref Range   WBC 9.4 4.6 - 10.2 K/uL   Lymph, poc 2.0 0.6 - 3.4   POC LYMPH PERCENT 21.5 10 - 50 %L   MID (cbc) 0.3 0 - 0.9   POC MID % 2.9 0 - 12 %M   POC Granulocyte 7.1 (A) 2 - 6.9   Granulocyte percent 75.6 37 - 80 %G   RBC 4.56 (A) 4.69 - 6.13 M/uL   Hemoglobin 12.5 (A) 14.1 - 18.1 g/dL   HCT, POC 38.9 (A) 43.5 - 53.7 %   MCV 85.2 80 - 97 fL   MCH, POC 27.3 27 - 31.2 pg   MCHC 32.1 31.8 - 35.4 g/dL   RDW, POC 16.5 %   Platelet Count, POC 242 142 - 424 K/uL   MPV 7.3 0 - 99.8 fL       Assessment & Plan:  Patient presents with upper abdominal discomfort bloating nausea inability to eat. He does have a calcification in the right upper abdomen which could be either kidney or gallbladder related. He is sent to the emergency room for evaluation  for further imaging studies prefers her evaluation of his abdominal pain. White count was normal he was anemic with a hemoglobin of 12.5. He was unable to get a urine specimen in our office.I personally performed the services described in this documentation, which was scribed in my presence. The recorded information has been reviewed and is accurate.  Nena Jordan, MD

## 2014-07-30 NOTE — Consult Note (Signed)
Re:   Joseph Moyer DOB:   06-14-1940 MRN:   [id number]  ASSESSMENT AND PLAN: 1.  Diverticulitis with 2 pericolonic abscesses in distal left colon.  These are proximal abscess, more in the distal left colon than sigmoid colon.  Plan:  IVF, antibiotics, NPO and follow up exams and labs.  Hopefully this will resolve without urgent surgery.  This is his first attack of documented diverticulitis.  2.  Cholelithiasis, asymptomatic 3.  Right basilar infiltrate - by CT 4.  HTN x 10 years.  Chief Complaint  Patient presents with  . Abdominal Pain   REFERRING PHYSICIAN: Kandice Hams, MD  HISTORY OF PRESENT ILLNESS: Joseph Moyer is a 74 y.o. (DOB: 03-Sep-1940)  AA  male whose primary care physician is [name],[name], MD/     He came to the [facility] with abdominal pain.  He originally went to [facility] and saw Dr. Chauncey Cruel. Everlene Farrier.  He described pain that occurred over 6 days.  The pain started Friday, 6/24.  He took some Pepto Bismol.  He took some Gas X for bloating.  On 6/26, he took Mg Citrate for his bowels and had a BM on Monday.  He had a black BM, possibly due to the [place].  Because of his worsening pain, he saw Dr. Everlene Farrier today at [facility].  He has not GI history.  He has had no abdominal surgery.  He thinks that Dr. Lajoyce Corners did a colonoscopy on him in 2007.  His next colonoscopy is 2017.   Abdominal CT scan - 07/30/2014 - Diverticulitis with 2 pericolonic abscesses. Although not mentioned in the body of the report there are some areas of fluid attenuation within the wall of the colon which may represent some intramural abscess formation.  Cholelithiasis without complicating factors.  Right basilar infiltrate.   Past Medical History  Diagnosis Date  . Allergy   . Arthritis      History reviewed. No pertinent past surgical history.    Current Facility-Administered Medications  Medication Dose Route Frequency Provider Last Rate Last Dose  . 0.9 %  sodium  chloride infusion   Intravenous Continuous Allyne Gee, MD      . acetaminophen (TYLENOL) tablet 650 mg  650 mg Oral Q6H PRN Allyne Gee, MD       Or  . acetaminophen (TYLENOL) suppository 650 mg  650 mg Rectal Q6H PRN Allyne Gee, MD      . Derrill Memo ON 07/31/2014] aspirin EC tablet 81 mg  81 mg Oral Daily Allyne Gee, MD      . ciprofloxacin (CIPRO) IVPB 400 mg  400 mg Intravenous Q12H Dorrene German, [facility]      . [START ON 07/31/2014] heparin injection 5,000 Units  5,000 Units Subcutaneous 3 times per day Allyne Gee, MD      . Derrill Memo ON 07/31/2014] losartan (COZAAR) tablet 25 mg  25 mg Oral Daily Allyne Gee, MD      . Derrill Memo ON 07/31/2014] metroNIDAZOLE (FLAGYL) IVPB 500 mg  500 mg Intravenous 3 times per day Allyne Gee, MD      . morphine 2 MG/ML injection 1 mg  1 mg Intravenous Q3H PRN Allyne Gee, MD      . ondansetron (ZOFRAN) tablet 4 mg  4 mg Oral Q6H PRN Allyne Gee, MD       Or  . ondansetron (ZOFRAN) injection 4 mg  4 mg Intravenous Q6H  PRN Allyne Gee, MD      . Derrill Memo ON 07/31/2014] simvastatin (ZOCOR) tablet 20 mg  20 mg Oral Daily Allyne Gee, MD         No Known Allergies  REVIEW OF SYSTEMS: Skin:  No history of rash.  No history of abnormal moles. Infection:  No history of hepatitis or HIV.  No history of MRSA. Neurologic:  No history of stroke.  No history of seizure.  No history of headaches. Cardiac:  HTN x 10 years. Pulmonary:  Does not smoke cigarettes.  No asthma or bronchitis.  No OSA/CPAP.  Endocrine:  No diabetes. No thyroid disease. Gastrointestinal:  No history of stomach disease.  No history of liver disease.  No history of gall bladder disease.  No history of pancreas disease.  No history of colon disease. Urologic:  No history of kidney stones.  No history of bladder infections. Musculoskeletal:  Left Knee arthroscopy by Dr. Wynelle Link in 04/13/2010.  He sees Dr. Jimmye Norman, a chiropractor, for knee pain. Hematologic:  No bleeding disorder.  No  history of anemia.  Not anticoagulated. Psycho-social:  The patient is oriented.   The patient has no obvious psychologic or social impairment to understanding our conversation and plan.  SOCIAL and FAMILY HISTORY: Married. Has 2 daughters - ages 37 and 33.  One lives in [place] and one in [place], [place]. He works part time at [employer] and making [employer].  PHYSICAL EXAM: BP 158/79 mmHg  Pulse 96  Temp(Src) 98.3 F (36.8 C) (Oral)  Resp 20  Ht 5\' 11"  (1.803 m)  Wt 90.946 kg (200 lb 8 oz)  BMI 27.98 kg/m2  SpO2 97%  General: AA M who is alert and generally healthy appearing.  HEENT: Normal. Pupils equal. Neck: Supple. No mass.  No thyroid mass. Lymph Nodes:  No supraclavicular or cervical nodes. Lungs: Clear to auscultation and symmetric breath sounds. Heart:  RRR. No murmur or rub.  Abdomen:  Distended. Normal bowel sounds.  No abdominal scars.  Has localized tenderness left flank/abdomen.  No peritoneal sxes. Rectal: Not done. Extremities:  Good strength and ROM  in upper and lower extremities. Neurologic:  Grossly intact to motor and sensory function. Psychiatric: Has normal mood and affect. Behavior is normal.   DATA REVIEWED: Epic notes.  Alphonsa Overall, MD,  [facility], [address]  [place], [place] Phone:  [phone] FAX:  [phone]

## 2014-07-30 NOTE — Progress Notes (Signed)
ANTIBIOTIC CONSULT NOTE - INITIAL  Pharmacy Consult for Cipro Indication: Diverticulitis  No Known Allergies  Patient Measurements: Height: 5\' 11"  (180.3 cm) Weight: 200 lb 8 oz (90.946 kg) IBW/kg (Calculated) : 75.3   Vital Signs: Temp: 98.3 F (36.8 C) (06/30 2150) Temp Source: Oral (06/30 2150) BP: 158/79 mmHg (06/30 2150) Pulse Rate: 96 (06/30 2150) Intake/Output from previous day:   Intake/Output from this shift:    Labs:  Recent Labs  07/30/14 1403 07/30/14 1640  WBC 9.4  --   HGB 12.5*  --   CREATININE  --  0.88   Estimated Creatinine Clearance: 88.8 mL/min (by C-G formula based on Cr of 0.88). No results for input(s): VANCOTROUGH, VANCOPEAK, VANCORANDOM, GENTTROUGH, GENTPEAK, GENTRANDOM, TOBRATROUGH, TOBRAPEAK, TOBRARND, AMIKACINPEAK, AMIKACINTROU, AMIKACIN in the last 72 hours.   Microbiology: No results found for this or any previous visit (from the past 720 hour(s)).  Medical History: Past Medical History  Diagnosis Date  . Allergy   . Arthritis     Medications:  Prescriptions prior to admission  Medication Sig Dispense Refill Last Dose  . aspirin 81 MG tablet Take 81 mg by mouth daily.   07/29/2014 at Unknown time  . losartan (COZAAR) 25 MG tablet Take 25 mg by mouth daily.  3 07/29/2014 at Unknown time  . simvastatin (ZOCOR) 20 MG tablet Take 20 mg by mouth daily.   07/29/2014 at Unknown time  . traMADol (ULTRAM) 50 MG tablet Take 1 tablet (50 mg total) by mouth every 8 (eight) hours as needed. (Patient not taking: Reported on 07/30/2014) 30 tablet 0 Not Taking at Unknown time   Scheduled:  . [START ON 07/31/2014] aspirin EC  81 mg Oral Daily  . [START ON 07/31/2014] heparin  5,000 Units Subcutaneous 3 times per day  . [START ON 07/31/2014] losartan  25 mg Oral Daily  . [START ON 07/31/2014] metronidazole  500 mg Intravenous 3 times per day  . [START ON 07/31/2014] simvastatin  20 mg Oral Daily   Infusions:  . sodium chloride     Assessment: 17 yoM c/o  of abdominal pain.   Cipro per Rx and Flagyl per MD for diverticultis.  6/30>>rocephin >> 6/30 6/30>> flagyl >> 7/1 >> cipro >>   Goal of Therapy:  Treat infection  Plan:   Cipro 400mg  IV q12h  F/u scr/cultures as needed  Lawana Pai R 07/30/2014,10:15 PM

## 2014-07-30 NOTE — Progress Notes (Deleted)
   Subjective:    Patient ID: Joseph Moyer, male    DOB: February 23, 1940, 74 y.o.   MRN: [id number]  HPI    Review of Systems     Objective:   Physical Exam        Assessment & Plan:

## 2014-07-31 DIAGNOSIS — E663 Overweight: Secondary | ICD-10-CM | POA: Diagnosis present

## 2014-07-31 DIAGNOSIS — Z79899 Other long term (current) drug therapy: Secondary | ICD-10-CM | POA: Diagnosis not present

## 2014-07-31 DIAGNOSIS — E785 Hyperlipidemia, unspecified: Secondary | ICD-10-CM | POA: Diagnosis present

## 2014-07-31 DIAGNOSIS — I10 Essential (primary) hypertension: Secondary | ICD-10-CM | POA: Diagnosis present

## 2014-07-31 DIAGNOSIS — E871 Hypo-osmolality and hyponatremia: Secondary | ICD-10-CM | POA: Diagnosis present

## 2014-07-31 DIAGNOSIS — Z833 Family history of diabetes mellitus: Secondary | ICD-10-CM | POA: Diagnosis not present

## 2014-07-31 DIAGNOSIS — K5900 Constipation, unspecified: Secondary | ICD-10-CM | POA: Diagnosis present

## 2014-07-31 DIAGNOSIS — G479 Sleep disorder, unspecified: Secondary | ICD-10-CM | POA: Diagnosis present

## 2014-07-31 DIAGNOSIS — E860 Dehydration: Secondary | ICD-10-CM | POA: Diagnosis present

## 2014-07-31 DIAGNOSIS — R109 Unspecified abdominal pain: Secondary | ICD-10-CM | POA: Diagnosis present

## 2014-07-31 DIAGNOSIS — Z6827 Body mass index (BMI) 27.0-27.9, adult: Secondary | ICD-10-CM | POA: Diagnosis not present

## 2014-07-31 DIAGNOSIS — K5792 Diverticulitis of intestine, part unspecified, without perforation or abscess without bleeding: Secondary | ICD-10-CM

## 2014-07-31 DIAGNOSIS — Z7982 Long term (current) use of aspirin: Secondary | ICD-10-CM | POA: Diagnosis not present

## 2014-07-31 DIAGNOSIS — K5720 Diverticulitis of large intestine with perforation and abscess without bleeding: Secondary | ICD-10-CM | POA: Diagnosis present

## 2014-07-31 LAB — COMPREHENSIVE METABOLIC PANEL
ALT: 69 U/L — ABNORMAL HIGH (ref 17–63)
ANION GAP: 9 (ref 5–15)
AST: 28 U/L (ref 15–41)
Albumin: 2.9 g/dL — ABNORMAL LOW (ref 3.5–5.0)
Alkaline Phosphatase: 97 U/L (ref 38–126)
BUN: 13 mg/dL (ref 6–20)
CALCIUM: 8.2 mg/dL — AB (ref 8.9–10.3)
CHLORIDE: 97 mmol/L — AB (ref 101–111)
CO2: 25 mmol/L (ref 22–32)
Creatinine, Ser: 0.84 mg/dL (ref 0.61–1.24)
Glucose, Bld: 112 mg/dL — ABNORMAL HIGH (ref 65–99)
Potassium: 3.7 mmol/L (ref 3.5–5.1)
Sodium: 131 mmol/L — ABNORMAL LOW (ref 135–145)
Total Bilirubin: 0.7 mg/dL (ref 0.3–1.2)
Total Protein: 7 g/dL (ref 6.5–8.1)

## 2014-07-31 LAB — CBC
HCT: 32.7 % — ABNORMAL LOW (ref 39.0–52.0)
Hemoglobin: 10.9 g/dL — ABNORMAL LOW (ref 13.0–17.0)
MCH: 28.4 pg (ref 26.0–34.0)
MCHC: 33.3 g/dL (ref 30.0–36.0)
MCV: 85.2 fL (ref 78.0–100.0)
Platelets: 198 10*3/uL (ref 150–400)
RBC: 3.84 MIL/uL — ABNORMAL LOW (ref 4.22–5.81)
RDW: 15.6 % — ABNORMAL HIGH (ref 11.5–15.5)
WBC: 7.3 10*3/uL (ref 4.0–10.5)

## 2014-07-31 LAB — GLUCOSE, CAPILLARY: GLUCOSE-CAPILLARY: 92 mg/dL (ref 65–99)

## 2014-07-31 MED ORDER — KCL IN DEXTROSE-NACL 20-5-0.45 MEQ/L-%-% IV SOLN
INTRAVENOUS | Status: DC
  Administered 2014-07-31: 125 mL/h via INTRAVENOUS
  Administered 2014-07-31 – 2014-08-02 (×4): via INTRAVENOUS
  Filled 2014-07-31 (×10): qty 1000

## 2014-07-31 MED ORDER — SALINE SPRAY 0.65 % NA SOLN
1.0000 | NASAL | Status: DC | PRN
  Administered 2014-07-31: 1 via NASAL
  Filled 2014-07-31: qty 44

## 2014-07-31 NOTE — Progress Notes (Signed)
PROGRESS NOTE  Joseph Moyer XTG:[id number] DOB: 1940-05-28 DOA: 07/30/2014 PCP: Kandice Hams, MD  HPI/Recap of past 21 hours: 74 year old male with past history of hypertension admitted on 6/30 for left lower quadrant pain and found to have new onset diverticulitis with several contained abdominal abscesses. Made nothing by mouth and started on IV Cipro and Flagyl. Seen by general surgery who at this time recommending conservative management.  Assessment/Plan: Principal Problem:   Acute diverticulitis:   stable. Belly pain continues to improve. If exam benign tomorrow and patient remains afebrile, we'll start to advance diet.  Active Problems:   Hyponatremia: Stable, secondary dehydration    Hyperlipidemia: Stable    HTN (hypertension): Blood pressure stable   Overweight (BMI 25.0-29.9): Patient meets criteria with BMI greater than 25   Code Status: full code  Family Communication: wife at bedside  Disposition Plan: home this weekend once tolerating solid by mouth   Consultants:  Gen. surgery  Procedures:  None  Antibiotics:  IV Cipro 6/30-present  IV Flagyl 6/30-present   Objective: BP 142/71 mmHg  Pulse 95  Temp(Src) 98.3 F (36.8 C) (Oral)  Resp 18  Ht 5\' 11"  (1.803 m)  Wt 90.946 kg (200 lb 8 oz)  BMI 27.98 kg/m2  SpO2 99%  Intake/Output Summary (Last 24 hours) at 07/31/14 1530 Last data filed at 07/31/14 0446  Gross per 24 hour  Intake 439.58 ml  Output      0 ml  Net 439.58 ml   Filed Weights   07/30/14 2150  Weight: 90.946 kg (200 lb 8 oz)    Exam:   General:  Alert and oriented 3, no acute distress  Cardiovascular: regular rate and rhythm, S1-S2  Respiratory: clear to auscultation bilaterally  Abdomen: soft, distended, some mild tenderness in left lower quadrant, normoactive bowel sounds  Musculoskeletal: no clubbing or cyanosis or edema   Data Reviewed: Basic Metabolic Panel:  Recent Labs Lab 07/30/14 1640  07/30/14 2246 07/31/14 0511  NA 134*  --  131*  K 3.9  --  3.7  CL 95*  --  97*  CO2 27  --  25  GLUCOSE 94  --  112*  BUN 19  --  13  CREATININE 0.88 0.83 0.84  CALCIUM 8.9  --  8.2*   Liver Function Tests:  Recent Labs Lab 07/30/14 1640 07/31/14 0511  AST 46* 28  ALT 95* 69*  ALKPHOS 128* 97  BILITOT 0.6 0.7  PROT 8.9* 7.0  ALBUMIN 3.6 2.9*    Recent Labs Lab 07/30/14 1640  LIPASE 15*   No results for input(s): AMMONIA in the last 168 hours. CBC:  Recent Labs Lab 07/30/14 1403 07/30/14 2246 07/31/14 0511  WBC 9.4 8.0 7.3  HGB 12.5* 11.4* 10.9*  HCT 38.9* 34.5* 32.7*  MCV 85.2 85.2 85.2  PLT  --  196 198   Cardiac Enzymes:   No results for input(s): CKTOTAL, CKMB, CKMBINDEX, TROPONINI in the last 168 hours. BNP (last 3 results) No results for input(s): BNP in the last 8760 hours.  ProBNP (last 3 results) No results for input(s): PROBNP in the last 8760 hours.  CBG:  Recent Labs Lab 07/31/14 0751  GLUCAP 92    No results found for this or any previous visit (from the past 240 hour(s)).   Studies: Ct Abdomen Pelvis W Contrast  07/30/2014   CLINICAL DATA:  Abdominal pain for 5 days with bloating  EXAM: CT ABDOMEN AND PELVIS WITH CONTRAST  TECHNIQUE: Multidetector  CT imaging of the abdomen and pelvis was performed using the standard protocol following bolus administration of intravenous contrast.  CONTRAST:  11mL OMNIPAQUE IOHEXOL 300 MG/ML SOLN, 157mL OMNIPAQUE IOHEXOL 300 MG/ML SOLN  COMPARISON:  07/30/14  FINDINGS: Lung bases are well aerated with evidence of mild right basilar infiltrate. No sizable effusion is seen.  The liver, spleen, adrenal glands and pancreas are within normal limits. The gallbladder is well distended and demonstrates a single dependent gallstone without complicating factors. Kidneys are well visualized bilaterally without renal calculi or obstructive changes. Ureters are within normal limits to the urinary bladder which is  partially distended.  There are changes of diverticulosis throughout the colon. In the mid descending colon there is evidence of diverticulitis with inflammation of adjacent small bowel loops. There are at least 2 air-fluid collections identified in the left lateral abdomen. One is best seen on image number 55 of series 2 measuring 2.6 x 3.3 cm consistent with a focal abscess. On coronal images it has a somewhat bilobed appearance draped over the adjacent colon. The craniocaudad measurement is approximately 8 cm. A second is noted along the mesenteric border of the: Measuring 3.1 by 2.6 cm. This is best seen on image number 64 of series  The appendix is within normal limits. No other focal abnormality is seen. 2. No significant free air is seen. No other definitive abscess is noted.  IMPRESSION: Diverticulitis with 2 pericolonic abscesses as described. Although not mentioned in the body of the report there are some areas of fluid attenuation within the wall of the colon which may represent some intramural abscess formation.  Cholelithiasis without complicating factors.  Right basilar infiltrate.   Electronically Signed   By: Inez Catalina M.D.   On: 07/30/2014 19:46    Scheduled Meds: . aspirin EC  81 mg Oral Daily  . ciprofloxacin  400 mg Intravenous Q12H  . heparin  5,000 Units Subcutaneous 3 times per day  . losartan  25 mg Oral Daily  . metronidazole  500 mg Intravenous 3 times per day  . simvastatin  20 mg Oral Daily    Continuous Infusions: . dextrose 5 % and 0.45 % NaCl with KCl 20 mEq/L 125 mL/hr (07/31/14 0115)     Time spent: 15 minutes  [facility] Pager [phone]. If 7PM-7AM, please contact night-coverage at [email or web address], password [facility] 07/31/2014, 3:30 PM  LOS: 0 days

## 2014-07-31 NOTE — Progress Notes (Signed)
Patient ID: Joseph Moyer, male   DOB: [birth year], 74 y.o.   MRN: [id number]    Subjective: Pt feels a little better today.  Still with some pain.  Objective: Vital signs in last 24 hours: Temp:  [97.9 F (36.6 C)-98.9 F (37.2 C)] 98.9 F (37.2 C) (07/01 0440) Pulse Rate:  [68-103] 93 (07/01 0440) Resp:  [16-20] 20 (07/01 0440) BP: (132-164)/(68-88) 132/73 mmHg (07/01 0440) SpO2:  [96 %-99 %] 98 % (07/01 0440) Weight:  [90.629 kg (199 lb 12.8 oz)-90.946 kg (200 lb 8 oz)] 90.946 kg (200 lb 8 oz) (06/30 2150) Last BM Date: 07/30/14  Intake/Output from previous day: 06/30 0701 - 07/01 0700 In: 439.6 [I.V.:439.6] Out: -  Intake/Output this shift:    PE: Abd: soft, minimal distention, pain in left midquadrant, +BS Heart: regular Lungs: CTAB  Lab Results:   Recent Labs  07/30/14 2246 07/31/14 0511  WBC 8.0 7.3  HGB 11.4* 10.9*  HCT 34.5* 32.7*  PLT 196 198   BMET  Recent Labs  07/30/14 1640 07/30/14 2246 07/31/14 0511  NA 134*  --  131*  K 3.9  --  3.7  CL 95*  --  97*  CO2 27  --  25  GLUCOSE 94  --  112*  BUN 19  --  13  CREATININE 0.88 0.83 0.84  CALCIUM 8.9  --  8.2*   PT/INR No results for input(s): LABPROT, INR in the last 72 hours. CMP     Component Value Date/Time   NA 131* 07/31/2014 0511   K 3.7 07/31/2014 0511   CL 97* 07/31/2014 0511   CO2 25 07/31/2014 0511   GLUCOSE 112* 07/31/2014 0511   BUN 13 07/31/2014 0511   CREATININE 0.84 07/31/2014 0511   CALCIUM 8.2* 07/31/2014 0511   PROT 7.0 07/31/2014 0511   ALBUMIN 2.9* 07/31/2014 0511   AST 28 07/31/2014 0511   ALT 69* 07/31/2014 0511   ALKPHOS 97 07/31/2014 0511   BILITOT 0.7 07/31/2014 0511   GFRNONAA >60 07/31/2014 0511   GFRAA >60 07/31/2014 0511   Lipase     Component Value Date/Time   LIPASE 15* 07/30/2014 1640       Studies/Results: Ct Abdomen Pelvis W Contrast  07/30/2014   CLINICAL DATA:  Abdominal pain for 5 days with bloating  EXAM: CT ABDOMEN AND PELVIS  WITH CONTRAST  TECHNIQUE: Multidetector CT imaging of the abdomen and pelvis was performed using the standard protocol following bolus administration of intravenous contrast.  CONTRAST:  65mL OMNIPAQUE IOHEXOL 300 MG/ML SOLN, 13mL OMNIPAQUE IOHEXOL 300 MG/ML SOLN  COMPARISON:  07/30/14  FINDINGS: Lung bases are well aerated with evidence of mild right basilar infiltrate. No sizable effusion is seen.  The liver, spleen, adrenal glands and pancreas are within normal limits. The gallbladder is well distended and demonstrates a single dependent gallstone without complicating factors. Kidneys are well visualized bilaterally without renal calculi or obstructive changes. Ureters are within normal limits to the urinary bladder which is partially distended.  There are changes of diverticulosis throughout the colon. In the mid descending colon there is evidence of diverticulitis with inflammation of adjacent small bowel loops. There are at least 2 air-fluid collections identified in the left lateral abdomen. One is best seen on image number 55 of series 2 measuring 2.6 x 3.3 cm consistent with a focal abscess. On coronal images it has a somewhat bilobed appearance draped over the adjacent colon. The craniocaudad measurement is approximately 8 cm. A second  is noted along the mesenteric border of the: Measuring 3.1 by 2.6 cm. This is best seen on image number 64 of series  The appendix is within normal limits. No other focal abnormality is seen. 2. No significant free air is seen. No other definitive abscess is noted.  IMPRESSION: Diverticulitis with 2 pericolonic abscesses as described. Although not mentioned in the body of the report there are some areas of fluid attenuation within the wall of the colon which may represent some intramural abscess formation.  Cholelithiasis without complicating factors.  Right basilar infiltrate.   Electronically Signed   By: Inez Catalina M.D.   On: 07/30/2014 19:46   Dg Abd Acute  W/chest  07/30/2014   CLINICAL DATA:  Abdominal pain  EXAM: DG ABDOMEN ACUTE W/ 1V CHEST  COMPARISON:  Lumbar spine radiographs 01/07/2014 at [place] at [place]: There is no evidence of dilated bowel loops or free intraperitoneal air. No radiopaque calculi or other significant radiographic abnormality is seen. Heart size and mediastinal contours are within normal limits. Both lungs are clear. Probable gallstone reidentified over the right upper quadrant. Renal calculus could appear similar.  IMPRESSION: Negative abdominal radiographs.  No acute cardiopulmonary disease.   Electronically Signed   By: Conchita Paris M.D.   On: 07/30/2014 14:11    Anti-infectives: Anti-infectives    Start     Dose/Rate Route Frequency Ordered Stop   07/31/14 0600  metroNIDAZOLE (FLAGYL) IVPB 500 mg     500 mg 100 mL/hr over 60 Minutes Intravenous 3 times per day 07/30/14 2205     07/30/14 2359  ciprofloxacin (CIPRO) IVPB 400 mg     400 mg 200 mL/hr over 60 Minutes Intravenous Every 12 hours 07/30/14 2231     07/30/14 2015  cefTRIAXone (ROCEPHIN) 1 g in dextrose 5 % 50 mL IVPB     1 g 100 mL/hr over 30 Minutes Intravenous  Once 07/30/14 2000 07/30/14 2123   07/30/14 2015  metroNIDAZOLE (FLAGYL) IVPB 500 mg  Status:  Discontinued     500 mg 100 mL/hr over 60 Minutes Intravenous  Once 07/30/14 2000 07/30/14 2205       Assessment/Plan  1. Diverticulitis with 2 pericolonic abscesses and possible intra-mural abscess -patient needs to be NPO.  Will back diet from fulls to NPO -cont Cipro/Flagyl -cont conservative management.  If patient fails to improve, he may require repeat CT scan, perc drains, or even an operation if all other measures fail.  Will continue to follow.  Discussed all of this with the patient and his wife      Rochele Lueck E 07/31/2014, 10:15 AM Pager: [phone]

## 2014-08-01 LAB — HEMOGLOBIN A1C
Hgb A1c MFr Bld: 6.8 % — ABNORMAL HIGH (ref 4.8–5.6)
MEAN PLASMA GLUCOSE: 148 mg/dL

## 2014-08-01 LAB — GLUCOSE, CAPILLARY: Glucose-Capillary: 126 mg/dL — ABNORMAL HIGH (ref 65–99)

## 2014-08-01 MED ORDER — METRONIDAZOLE 500 MG PO TABS
500.0000 mg | ORAL_TABLET | Freq: Three times a day (TID) | ORAL | Status: DC
  Administered 2014-08-01 – 2014-08-03 (×6): 500 mg via ORAL
  Filled 2014-08-01 (×9): qty 1

## 2014-08-01 MED ORDER — CIPROFLOXACIN HCL 500 MG PO TABS
500.0000 mg | ORAL_TABLET | Freq: Two times a day (BID) | ORAL | Status: DC
  Administered 2014-08-01 – 2014-08-03 (×4): 500 mg via ORAL
  Filled 2014-08-01 (×6): qty 1

## 2014-08-01 NOTE — Progress Notes (Signed)
PROGRESS NOTE  Joseph Moyer:[id number] DOB: 11-13-1940 DOA: 07/30/2014 PCP: Kandice Hams, MD  HPI/Recap of past 47 hours: 74 year old male with past history of hypertension admitted on 6/30 for left lower quadrant pain and found to have new onset diverticulitis with several contained abdominal abscesses. Made nothing by mouth and started on IV Cipro and Flagyl. Seen by general surgery who at this time recommending conservative management.  By following day, patient doing better. Pain is 3/10 without any pain medication. Started on clear liquids and tolerating with only some mild left lower quadrant soreness.  Assessment/Plan: Principal Problem:   Acute diverticulitis:   stable. Belly pain continues to improve. Advance to full liquids for dinner. If tolerating, solid food on 7/3 and potential discharge home. Will change antibiotics to by mouth.  Active Problems:   Hyponatremia: Stable, secondary dehydration    Hyperlipidemia: Stable    HTN (hypertension): Blood pressure stable   Overweight (BMI 25.0-29.9): Patient meets criteria with BMI greater than 25   Code Status: full code  Family Communication: Spoke with wife yesterday  Disposition Plan: If continues to progress, likely home tomorrow   Consultants:  Gen. surgery  Procedures:  None  Antibiotics:  Cipro 6/30-present  Flagyl 6/30-present   Objective: BP 130/61 mmHg  Pulse 74  Temp(Src) 98.5 F (36.9 C) (Oral)  Resp 18  Ht 5\' 11"  (1.803 m)  Wt 90.946 kg (200 lb 8 oz)  BMI 27.98 kg/m2  SpO2 99%  Intake/Output Summary (Last 24 hours) at 08/01/14 1258 Last data filed at 08/01/14 1017  Gross per 24 hour  Intake   2595 ml  Output      0 ml  Net   2595 ml   Filed Weights   07/30/14 2150  Weight: 90.946 kg (200 lb 8 oz)    Exam:   General:  Alert and oriented 3, no acute distress  Cardiovascular: regular rate and rhythm, S1-S2  Respiratory: clear to auscultation  bilaterally  Abdomen: soft, distended, nontender in left lower quadrant, normoactive bowel sounds  Musculoskeletal: no clubbing or cyanosis or edema   Data Reviewed: Basic Metabolic Panel:  Recent Labs Lab 07/30/14 1640 07/30/14 2246 07/31/14 0511  NA 134*  --  131*  K 3.9  --  3.7  CL 95*  --  97*  CO2 27  --  25  GLUCOSE 94  --  112*  BUN 19  --  13  CREATININE 0.88 0.83 0.84  CALCIUM 8.9  --  8.2*   Liver Function Tests:  Recent Labs Lab 07/30/14 1640 07/31/14 0511  AST 46* 28  ALT 95* 69*  ALKPHOS 128* 97  BILITOT 0.6 0.7  PROT 8.9* 7.0  ALBUMIN 3.6 2.9*    Recent Labs Lab 07/30/14 1640  LIPASE 15*   No results for input(s): AMMONIA in the last 168 hours. CBC:  Recent Labs Lab 07/30/14 1403 07/30/14 2246 07/31/14 0511  WBC 9.4 8.0 7.3  HGB 12.5* 11.4* 10.9*  HCT 38.9* 34.5* 32.7*  MCV 85.2 85.2 85.2  PLT  --  196 198   Cardiac Enzymes:   No results for input(s): CKTOTAL, CKMB, CKMBINDEX, TROPONINI in the last 168 hours. BNP (last 3 results) No results for input(s): BNP in the last 8760 hours.  ProBNP (last 3 results) No results for input(s): PROBNP in the last 8760 hours.  CBG:  Recent Labs Lab 07/31/14 0751 08/01/14 0756  GLUCAP 92 126*    No results found for this or any  previous visit (from the past 240 hour(s)).   Studies: No results found.  Scheduled Meds: . aspirin EC  81 mg Oral Daily  . ciprofloxacin  500 mg Oral BID  . heparin  5,000 Units Subcutaneous 3 times per day  . losartan  25 mg Oral Daily  . metroNIDAZOLE  500 mg Oral 3 times per day  . simvastatin  20 mg Oral Daily    Continuous Infusions: . dextrose 5 % and 0.45 % NaCl with KCl 20 mEq/L 125 mL/hr at 08/01/14 1110     Time spent: 15 minutes  [facility] Pager [phone]. If 7PM-7AM, please contact night-coverage at [email or web address], password [facility] 08/01/2014, 12:58 PM  LOS: 1 day

## 2014-08-01 NOTE — Progress Notes (Signed)
Patient ID: Joseph Moyer, male   DOB: [birth year], 74 y.o.   MRN: [id number] [facility] Surgery Progress Note:   * No surgery found *  Subjective: Mental status is clear.  Feeling better.  Pain much improved.   Objective: Vital signs in last 24 hours: Temp:  [98.3 F (36.8 C)-98.5 F (36.9 C)] 98.5 F (36.9 C) (07/01 2120) Pulse Rate:  [74-95] 74 (07/01 2120) Resp:  [18] 18 (07/01 2120) BP: (130-142)/(61-71) 130/61 mmHg (07/01 2120) SpO2:  [99 %] 99 % (07/01 2120)  Intake/Output from previous day: 07/01 0701 - 07/02 0700 In: 1975 [P.O.:1500; I.V.:375; IV Piggyback:100] Out: -  Intake/Output this shift:    Physical Exam: Work of breathing is not labored.  Pain resolving  Lab Results:  Results for orders placed or performed during the hospital encounter of 07/30/14 (from the past 48 hour(s))  Urinalysis, Routine w reflex microscopic (not at [facility])     Status: Abnormal   Collection Time: 07/30/14  2:44 PM  Result Value Ref Range   Color, Urine AMBER (A) YELLOW    Comment: BIOCHEMICALS MAY BE AFFECTED BY COLOR   APPearance CLEAR CLEAR   Specific Gravity, Urine 1.028 1.005 - 1.030   pH 6.0 5.0 - 8.0   Glucose, UA NEGATIVE NEGATIVE mg/dL   Hgb urine dipstick NEGATIVE NEGATIVE   Bilirubin Urine SMALL (A) NEGATIVE   Ketones, ur NEGATIVE NEGATIVE mg/dL   Protein, ur 30 (A) NEGATIVE mg/dL   Urobilinogen, UA 1.0 0.0 - 1.0 mg/dL   Nitrite NEGATIVE NEGATIVE   Leukocytes, UA NEGATIVE NEGATIVE  Urine microscopic-add on     Status: None   Collection Time: 07/30/14  2:44 PM  Result Value Ref Range   Urine-Other MUCOUS PRESENT   Basic metabolic panel     Status: Abnormal   Collection Time: 07/30/14  4:40 PM  Result Value Ref Range   Sodium 134 (L) 135 - 145 mmol/L   Potassium 3.9 3.5 - 5.1 mmol/L   Chloride 95 (L) 101 - 111 mmol/L   CO2 27 22 - 32 mmol/L   Glucose, Bld 94 65 - 99 mg/dL   BUN 19 6 - 20 mg/dL   Creatinine, Ser 0.88 0.61 - 1.24 mg/dL   Calcium 8.9 8.9 -  10.3 mg/dL   GFR calc non Af Amer >60 >60 mL/min   GFR calc Af Amer >60 >60 mL/min    Comment: (NOTE) The eGFR has been calculated using the CKD EPI equation. This calculation has not been validated in all clinical situations. eGFR's persistently <60 mL/min signify possible Chronic Kidney Disease.    Anion gap 12 5 - 15  Hepatic function panel     Status: Abnormal   Collection Time: 07/30/14  4:40 PM  Result Value Ref Range   Total Protein 8.9 (H) 6.5 - 8.1 g/dL   Albumin 3.6 3.5 - 5.0 g/dL   AST 46 (H) 15 - 41 U/L   ALT 95 (H) 17 - 63 U/L   Alkaline Phosphatase 128 (H) 38 - 126 U/L   Total Bilirubin 0.6 0.3 - 1.2 mg/dL   Bilirubin, Direct 0.1 0.1 - 0.5 mg/dL   Indirect Bilirubin 0.5 0.3 - 0.9 mg/dL  Lipase, blood     Status: Abnormal   Collection Time: 07/30/14  4:40 PM  Result Value Ref Range   Lipase 15 (L) 22 - 51 U/L  CBC     Status: Abnormal   Collection Time: 07/30/14 10:46 PM  Result Value Ref  Range   WBC 8.0 4.0 - 10.5 K/uL   RBC 4.05 (L) 4.22 - 5.81 MIL/uL   Hemoglobin 11.4 (L) 13.0 - 17.0 g/dL   HCT 34.5 (L) 39.0 - 52.0 %   MCV 85.2 78.0 - 100.0 fL   MCH 28.1 26.0 - 34.0 pg   MCHC 33.0 30.0 - 36.0 g/dL   RDW 15.6 (H) 11.5 - 15.5 %   Platelets 196 150 - 400 K/uL  Creatinine, serum     Status: None   Collection Time: 07/30/14 10:46 PM  Result Value Ref Range   Creatinine, Ser 0.83 0.61 - 1.24 mg/dL   GFR calc non Af Amer >60 >60 mL/min   GFR calc Af Amer >60 >60 mL/min    Comment: (NOTE) The eGFR has been calculated using the CKD EPI equation. This calculation has not been validated in all clinical situations. eGFR's persistently <60 mL/min signify possible Chronic Kidney Disease.   Hemoglobin A1c     Status: Abnormal   Collection Time: 07/30/14 10:46 PM  Result Value Ref Range   Hgb A1c MFr Bld 6.8 (H) 4.8 - 5.6 %    Comment: (NOTE)         Pre-diabetes: 5.7 - 6.4         Diabetes: >6.4         Glycemic control for adults with diabetes: <7.0    Mean  Plasma Glucose 148 mg/dL    Comment: (NOTE) Performed At: [facility] [place], [place] [id number] Lindon Romp MD QA:[id number]   CBC     Status: Abnormal   Collection Time: 07/31/14  5:11 AM  Result Value Ref Range   WBC 7.3 4.0 - 10.5 K/uL   RBC 3.84 (L) 4.22 - 5.81 MIL/uL   Hemoglobin 10.9 (L) 13.0 - 17.0 g/dL   HCT 32.7 (L) 39.0 - 52.0 %   MCV 85.2 78.0 - 100.0 fL   MCH 28.4 26.0 - 34.0 pg   MCHC 33.3 30.0 - 36.0 g/dL   RDW 15.6 (H) 11.5 - 15.5 %   Platelets 198 150 - 400 K/uL  Comprehensive metabolic panel     Status: Abnormal   Collection Time: 07/31/14  5:11 AM  Result Value Ref Range   Sodium 131 (L) 135 - 145 mmol/L   Potassium 3.7 3.5 - 5.1 mmol/L   Chloride 97 (L) 101 - 111 mmol/L   CO2 25 22 - 32 mmol/L   Glucose, Bld 112 (H) 65 - 99 mg/dL   BUN 13 6 - 20 mg/dL   Creatinine, Ser 0.84 0.61 - 1.24 mg/dL   Calcium 8.2 (L) 8.9 - 10.3 mg/dL   Total Protein 7.0 6.5 - 8.1 g/dL   Albumin 2.9 (L) 3.5 - 5.0 g/dL   AST 28 15 - 41 U/L   ALT 69 (H) 17 - 63 U/L   Alkaline Phosphatase 97 38 - 126 U/L   Total Bilirubin 0.7 0.3 - 1.2 mg/dL   GFR calc non Af Amer >60 >60 mL/min   GFR calc Af Amer >60 >60 mL/min    Comment: (NOTE) The eGFR has been calculated using the CKD EPI equation. This calculation has not been validated in all clinical situations. eGFR's persistently <60 mL/min signify possible Chronic Kidney Disease.    Anion gap 9 5 - 15  Glucose, capillary     Status: None   Collection Time: 07/31/14  7:51 AM  Result Value Ref Range   Glucose-Capillary 92 65 -  99 mg/dL  Glucose, capillary     Status: Abnormal   Collection Time: 08/01/14  7:56 AM  Result Value Ref Range   Glucose-Capillary 126 (H) 65 - 99 mg/dL   Comment 1 Notify RN     Radiology/Results: Ct Abdomen Pelvis W Contrast  07/30/2014   CLINICAL DATA:  Abdominal pain for 5 days with bloating  EXAM: CT ABDOMEN AND PELVIS WITH CONTRAST  TECHNIQUE: Multidetector CT imaging  of the abdomen and pelvis was performed using the standard protocol following bolus administration of intravenous contrast.  CONTRAST:  24m OMNIPAQUE IOHEXOL 300 MG/ML SOLN, 1072mOMNIPAQUE IOHEXOL 300 MG/ML SOLN  COMPARISON:  07/30/14  FINDINGS: Lung bases are well aerated with evidence of mild right basilar infiltrate. No sizable effusion is seen.  The liver, spleen, adrenal glands and pancreas are within normal limits. The gallbladder is well distended and demonstrates a single dependent gallstone without complicating factors. Kidneys are well visualized bilaterally without renal calculi or obstructive changes. Ureters are within normal limits to the urinary bladder which is partially distended.  There are changes of diverticulosis throughout the colon. In the mid descending colon there is evidence of diverticulitis with inflammation of adjacent small bowel loops. There are at least 2 air-fluid collections identified in the left lateral abdomen. One is best seen on image number 55 of series 2 measuring 2.6 x 3.3 cm consistent with a focal abscess. On coronal images it has a somewhat bilobed appearance draped over the adjacent colon. The craniocaudad measurement is approximately 8 cm. A second is noted along the mesenteric border of the: Measuring 3.1 by 2.6 cm. This is best seen on image number 64 of series  The appendix is within normal limits. No other focal abnormality is seen. 2. No significant free air is seen. No other definitive abscess is noted.  IMPRESSION: Diverticulitis with 2 pericolonic abscesses as described. Although not mentioned in the body of the report there are some areas of fluid attenuation within the wall of the colon which may represent some intramural abscess formation.  Cholelithiasis without complicating factors.  Right basilar infiltrate.   Electronically Signed   By: MaInez Catalina.D.   On: 07/30/2014 19:46   Dg Abd Acute W/chest  07/30/2014   CLINICAL DATA:  Abdominal pain  EXAM:  DG ABDOMEN ACUTE W/ 1V CHEST  COMPARISON:  [facility] no evidence of dilated bowel loops or free intraperitoneal air. No radiopaque calculi or other significant radiographic abnormality is seen. Heart size and mediastinal contours are within normal limits. Both lungs are clear. Probable gallstone reidentified over the right upper quadrant. Renal calculus could appear similar.  IMPRESSION: Negative abdominal radiographs.  No acute cardiopulmonary disease.   Electronically Signed   By: GrConchita Paris.D.   On: 07/30/2014 14:11    Anti-infectives: Anti-infectives    Start     Dose/Rate Route Frequency Ordered Stop   07/31/14 0600  metroNIDAZOLE (FLAGYL) IVPB 500 mg     500 mg 100 mL/hr over 60 Minutes Intravenous 3 times per day 07/30/14 2205     07/30/14 2359  ciprofloxacin (CIPRO) IVPB 400 mg     400 mg 200 mL/hr over 60 Minutes Intravenous Every 12 hours 07/30/14 2231     07/30/14 2015  cefTRIAXone (ROCEPHIN) 1 g in dextrose 5 % 50 mL IVPB     1 g 100 mL/hr over 30 Minutes Intravenous  Once 07/30/14 2000  07/30/14 2123   07/30/14 2015  metroNIDAZOLE (FLAGYL) IVPB 500 mg  Status:  Discontinued     500 mg 100 mL/hr over 60 Minutes Intravenous  Once 07/30/14 2000 07/30/14 2205      Assessment/Plan: Problem List: Patient Active Problem List   Diagnosis Date Noted  . Overweight (BMI 25.0-29.9) 07/31/2014  . Acute diverticulitis 07/30/2014  . Elevated BP 07/30/2014  . Hyponatremia 07/30/2014  . Hyperlipidemia 07/30/2014  . HTN (hypertension) 07/30/2014    WBC is normal.  Would be ok to start clear liquids and switch to PO Cipro and Flagyl.   * No surgery found *    LOS: 1 day   Matt B. Hassell Done, MD, [facility] [phone] beeper [phone]  08/01/2014 8:46 AM

## 2014-08-02 LAB — GLUCOSE, CAPILLARY: Glucose-Capillary: 125 mg/dL — ABNORMAL HIGH (ref 65–99)

## 2014-08-02 MED ORDER — ZOLPIDEM TARTRATE 5 MG PO TABS
5.0000 mg | ORAL_TABLET | Freq: Every day | ORAL | Status: DC
  Administered 2014-08-02: 5 mg via ORAL
  Filled 2014-08-02: qty 1

## 2014-08-02 NOTE — Progress Notes (Signed)
PROGRESS NOTE  Joseph Moyer OQH:[id number] DOB: 01/01/1941 DOA: 07/30/2014 PCP: Kandice Hams, MD  HPI/Recap of past 38 hours: 74 year old male with past history of hypertension admitted on 6/30 for left lower quadrant pain and found to have new onset diverticulitis with several contained abdominal abscesses. Made nothing by mouth and started on IV Cipro and Flagyl. Seen by general surgery who at this time recommending conservative management.  Diet advance to clear liquids on 7/2 which patient tolerated. Now tolerating full liquids. Pain about 4/10 without pain medication. No nausea.  Assessment/Plan: Principal Problem:   Acute diverticulitis:   stable. Belly pain minimal. We'll advanced to solid food and monitor overnight. Likely discharge tomorrow. Antibiotics changed to by mouth  Active Problems:   Hyponatremia: Stable, secondary dehydration    Hyperlipidemia: Stable    HTN (hypertension): Blood pressure stable   Overweight (BMI 25.0-29.9): Patient meets criteria with BMI greater than 25   Code Status: full code  Family Communication: Left message with wife  Disposition Plan: If tolerate solid food, likely home tomorrow   Consultants:  Gen. surgery  Procedures:  None  Antibiotics:  Cipro 6/30-present  Flagyl 6/30-present   Objective: BP 125/78 mmHg  Pulse 85  Temp(Src) 98.7 F (37.1 C) (Oral)  Resp 18  Ht 5\' 11"  (1.803 m)  Wt 90.946 kg (200 lb 8 oz)  BMI 27.98 kg/m2  SpO2 97%  Intake/Output Summary (Last 24 hours) at 08/02/14 1300 Last data filed at 08/01/14 1909  Gross per 24 hour  Intake    120 ml  Output      0 ml  Net    120 ml   Filed Weights   07/30/14 2150  Weight: 90.946 kg (200 lb 8 oz)    Exam: Unchanged from previous day  General:  Alert and oriented 3, no acute distress  Cardiovascular: regular rate and rhythm, S1-S2  Respiratory: clear to auscultation bilaterally  Abdomen: soft, distended, nontender in left lower  quadrant, normoactive bowel sounds  Musculoskeletal: no clubbing or cyanosis or edema   Data Reviewed: Basic Metabolic Panel:  Recent Labs Lab 07/30/14 1640 07/30/14 2246 07/31/14 0511  NA 134*  --  131*  K 3.9  --  3.7  CL 95*  --  97*  CO2 27  --  25  GLUCOSE 94  --  112*  BUN 19  --  13  CREATININE 0.88 0.83 0.84  CALCIUM 8.9  --  8.2*   Liver Function Tests:  Recent Labs Lab 07/30/14 1640 07/31/14 0511  AST 46* 28  ALT 95* 69*  ALKPHOS 128* 97  BILITOT 0.6 0.7  PROT 8.9* 7.0  ALBUMIN 3.6 2.9*    Recent Labs Lab 07/30/14 1640  LIPASE 15*   No results for input(s): AMMONIA in the last 168 hours. CBC:  Recent Labs Lab 07/30/14 1403 07/30/14 2246 07/31/14 0511  WBC 9.4 8.0 7.3  HGB 12.5* 11.4* 10.9*  HCT 38.9* 34.5* 32.7*  MCV 85.2 85.2 85.2  PLT  --  196 198   Cardiac Enzymes:   No results for input(s): CKTOTAL, CKMB, CKMBINDEX, TROPONINI in the last 168 hours. BNP (last 3 results) No results for input(s): BNP in the last 8760 hours.  ProBNP (last 3 results) No results for input(s): PROBNP in the last 8760 hours.  CBG:  Recent Labs Lab 07/31/14 0751 08/01/14 0756 08/02/14 0730  GLUCAP 92 126* 125*    No results found for this or any previous visit (from the past  240 hour(s)).   Studies: No results found.  Scheduled Meds: . aspirin EC  81 mg Oral Daily  . ciprofloxacin  500 mg Oral BID  . heparin  5,000 Units Subcutaneous 3 times per day  . losartan  25 mg Oral Daily  . metroNIDAZOLE  500 mg Oral 3 times per day  . simvastatin  20 mg Oral Daily  . zolpidem  5 mg Oral QHS    Continuous Infusions:     Time spent: 15 minutes  [facility] Pager [phone]. If 7PM-7AM, please contact night-coverage at [email or web address], password [facility] 08/02/2014, 1:00 PM  LOS: 2 days

## 2014-08-02 NOTE — Progress Notes (Signed)
  Subjective: Not much pain. No n/v. +flatus/bm  Objective: Vital signs in last 24 hours: Temp:  [98.3 F (36.8 C)-98.7 F (37.1 C)] 98.7 F (37.1 C) (07/03 0543) Pulse Rate:  [85-90] 85 (07/03 0543) Resp:  [18] 18 (07/03 0543) BP: (125-143)/(75-78) 125/78 mmHg (07/03 0543) SpO2:  [97 %-99 %] 97 % (07/03 0543) Last BM Date: 08/02/14 (per pt)  Intake/Output from previous day: 07/02 0701 - 07/03 0700 In: 740 [P.O.:740] Out: -  Intake/Output this shift:    Asleep, awakens easily Soft, nd, very mild lateral LLQ TTP  Lab Results:   Recent Labs  07/30/14 2246 07/31/14 0511  WBC 8.0 7.3  HGB 11.4* 10.9*  HCT 34.5* 32.7*  PLT 196 198   BMET  Recent Labs  07/30/14 1640 07/30/14 2246 07/31/14 0511  NA 134*  --  131*  K 3.9  --  3.7  CL 95*  --  97*  CO2 27  --  25  GLUCOSE 94  --  112*  BUN 19  --  13  CREATININE 0.88 0.83 0.84  CALCIUM 8.9  --  8.2*   PT/INR No results for input(s): LABPROT, INR in the last 72 hours. ABG No results for input(s): PHART, HCO3 in the last 72 hours.  Invalid input(s): PCO2, PO2  Studies/Results: No results found.  Anti-infectives: Anti-infectives    Start     Dose/Rate Route Frequency Ordered Stop   08/01/14 2200  ciprofloxacin (CIPRO) tablet 500 mg     500 mg Oral 2 times daily 08/01/14 1258     08/01/14 1400  metroNIDAZOLE (FLAGYL) tablet 500 mg     500 mg Oral 3 times per day 08/01/14 1258     07/31/14 0600  metroNIDAZOLE (FLAGYL) IVPB 500 mg  Status:  Discontinued     500 mg 100 mL/hr over 60 Minutes Intravenous 3 times per day 07/30/14 2205 08/01/14 1258   07/30/14 2359  ciprofloxacin (CIPRO) IVPB 400 mg  Status:  Discontinued     400 mg 200 mL/hr over 60 Minutes Intravenous Every 12 hours 07/30/14 2231 08/01/14 1258   07/30/14 2015  cefTRIAXone (ROCEPHIN) 1 g in dextrose 5 % 50 mL IVPB     1 g 100 mL/hr over 30 Minutes Intravenous  Once 07/30/14 2000 07/30/14 2123   07/30/14 2015  metroNIDAZOLE (FLAGYL) IVPB  500 mg  Status:  Discontinued     500 mg 100 mL/hr over 60 Minutes Intravenous  Once 07/30/14 2000 07/30/14 2205      Assessment/Plan: Principal Problem:   Acute diverticulitis Active Problems:   Hyponatremia   Hyperlipidemia   HTN (hypertension)   Overweight (BMI 25.0-29.9)  Ok with diet advancement Cont oral abx Will need oupt colonoscopy in 6-8 weeks & outpt f/u with Dr Lucia Gaskins in several weeks Nutrition consult for low fiber diet teaching (low fiber diet for several weeks then transition to high fiber)  Joseph Ruff. Redmond Pulling, MD, FACS General, Bariatric, & Minimally Invasive Surgery [facility], [place]    LOS: 2 days    Joseph Moyer 08/02/2014

## 2014-08-02 NOTE — Progress Notes (Signed)
Protocol: Cipro per pharmacy  Indication: Diverticulitis  Assessment:  Pt was on Cipro 400mg  IV Q12h and MD changed to Cipro 500mg  po Q12h yesterday. Clcr=93 ml/min. Will sign off.  Thanks, Garnet Sierras, PharmD. 08/02/2014

## 2014-08-03 LAB — CBC
HCT: 36.2 % — ABNORMAL LOW (ref 39.0–52.0)
Hemoglobin: 11.8 g/dL — ABNORMAL LOW (ref 13.0–17.0)
MCH: 28 pg (ref 26.0–34.0)
MCHC: 32.6 g/dL (ref 30.0–36.0)
MCV: 85.8 fL (ref 78.0–100.0)
PLATELETS: 338 10*3/uL (ref 150–400)
RBC: 4.22 MIL/uL (ref 4.22–5.81)
RDW: 15.3 % (ref 11.5–15.5)
WBC: 7.2 10*3/uL (ref 4.0–10.5)

## 2014-08-03 LAB — BASIC METABOLIC PANEL
Anion gap: 9 (ref 5–15)
BUN: 10 mg/dL (ref 6–20)
CALCIUM: 9 mg/dL (ref 8.9–10.3)
CO2: 26 mmol/L (ref 22–32)
Chloride: 97 mmol/L — ABNORMAL LOW (ref 101–111)
Creatinine, Ser: 0.91 mg/dL (ref 0.61–1.24)
GFR calc Af Amer: >60 mL/min (ref >60)
Glucose, Bld: 108 mg/dL — ABNORMAL HIGH (ref 65–99)
Potassium: 4.6 mmol/L (ref 3.5–5.1)
SODIUM: 132 mmol/L — AB (ref 135–145)

## 2014-08-03 LAB — GLUCOSE, CAPILLARY: GLUCOSE-CAPILLARY: 103 mg/dL — AB (ref 65–99)

## 2014-08-03 MED ORDER — CIPROFLOXACIN HCL 500 MG PO TABS: 500.0000 mg | ORAL_TABLET | Freq: Two times a day (BID) | ORAL | Status: DC

## 2014-08-03 MED ORDER — METRONIDAZOLE 500 MG PO TABS: 500.0000 mg | ORAL_TABLET | Freq: Three times a day (TID) | ORAL | Status: DC

## 2014-08-03 MED ORDER — ZOLPIDEM TARTRATE 5 MG PO TABS: 5.0000 mg | ORAL_TABLET | Freq: Every day | ORAL | Status: DC

## 2014-08-03 NOTE — Discharge Instructions (Addendum)
Diverticulitis Diverticulitis is inflammation or infection of small pouches in your colon that form when you have a condition called diverticulosis. The pouches in your colon are called diverticula. Your colon, or large intestine, is where water is absorbed and stool is formed. Complications of diverticulitis can include:  Bleeding.  Severe infection.  Severe pain.  Perforation of your colon.  Obstruction of your colon. CAUSES  Diverticulitis is caused by bacteria. Diverticulitis happens when stool becomes trapped in diverticula. This allows bacteria to grow in the diverticula, which can lead to inflammation and infection. RISK FACTORS People with diverticulosis are at risk for diverticulitis. Eating a diet that does not include enough fiber from fruits and vegetables may make diverticulitis more likely to develop. SYMPTOMS  Symptoms of diverticulitis may include:  Abdominal pain and tenderness. The pain is normally located on the left side of the abdomen, but may occur in other areas.  Fever and chills.  Bloating.  Cramping.  Nausea.  Vomiting.  Constipation.  Diarrhea.  Blood in your stool. DIAGNOSIS  Your health care provider will ask you about your medical history and do a physical exam. You may need to have tests done because many medical conditions can cause the same symptoms as diverticulitis. Tests may include:  Blood tests.  Urine tests.  Imaging tests of the abdomen, including X-rays and CT scans. When your condition is under control, your health care provider may recommend that you have a colonoscopy. A colonoscopy can show how severe your diverticula are and whether something else is causing your symptoms. TREATMENT  Most cases of diverticulitis are mild and can be treated at home. Treatment may include:  Taking over-the-counter pain medicines.  Following a clear liquid diet.  Taking antibiotic medicines by mouth for 7-10 days. More severe cases may  be treated at a hospital. Treatment may include:  Not eating or drinking.  Taking prescription pain medicine.  Receiving antibiotic medicines through an IV tube.  Receiving fluids and nutrition through an IV tube.  Surgery. HOME CARE INSTRUCTIONS   Follow your health care provider's instructions carefully.  Follow a full liquid diet or other diet as directed by your health care provider. After your symptoms improve, your health care provider may tell you to change your diet. He or she may recommend you eat a high-fiber diet. Fruits and vegetables are good sources of fiber. Fiber makes it easier to pass stool.  Take fiber supplements or probiotics as directed by your health care provider.  Only take medicines as directed by your health care provider.  Keep all your follow-up appointments. SEEK MEDICAL CARE IF:   Your pain does not improve.  You have a hard time eating food.  Your bowel movements do not return to normal. SEEK IMMEDIATE MEDICAL CARE IF:   Your pain becomes worse.  Your symptoms do not get better.  Your symptoms suddenly get worse.  You have a fever.  You have repeated vomiting.  You have bloody or black, tarry stools. MAKE SURE YOU:   Understand these instructions.  Will watch your condition.  Will get help right away if you are not doing well or get worse. Document Released: 10/26/2004 Document Revised: 01/21/2013 Document Reviewed: 12/11/2012 [facility] Patient Information 2015 [place], [place]. This information is not intended to replace advice given to you by your health care provider. Make sure you discuss any questions you have with your health care provider.   Low-Fiber Diet Fiber is found in fruits, vegetables, and whole grains. A  low-fiber diet restricts fibrous foods that are not digested in the small intestine. A diet containing about 10-15 grams of fiber per day is considered low fiber. Low-fiber diets may be used to:  Promote healing and  rest the bowel during intestinal flare-ups.  Prevent blockage of a partially obstructed or narrowed gastrointestinal tract.  Reduce fecal weight and volume.  Slow the movement of feces. You may be on a low-fiber diet as a transitional diet following surgery, after an injury (trauma), or because of a short (acute) or lifelong (chronic) illness. Your health care provider will determine the length of time you need to stay on this diet.  WHAT DO I NEED TO KNOW ABOUT A LOW-FIBER DIET? Always check the fiber content on the packaging's Nutrition Facts label, especially on foods from the grains list. Ask your dietitian if you have questions about specific foods that are related to your condition, especially if the food is not listed below. In general, a low-fiber food will have less than 2 g of fiber. WHAT FOODS CAN I EAT? Grains All breads and crackers made with white flour. Sweet rolls, doughnuts, waffles, pancakes, [place] toast, bagels. Pretzels, Melba toast, zwieback. Well-cooked cereals, such as cornmeal, farina, or cream cereals. Dry cereals that do not contain whole grains, fruit, or nuts, such as refined corn, wheat, rice, and oat cereals. Potatoes prepared any way without skins, plain pastas and noodles, refined white rice. Use white flour for baking and making sauces. Use allowed list of grains for casseroles, dumplings, and puddings.  Vegetables Strained tomato and vegetable juices. Fresh lettuce, cucumber, spinach. Well-cooked (no skin or pulp) or canned vegetables, such as asparagus, bean sprouts, beets, carrots, green beans, mushrooms, potatoes, pumpkin, spinach, yellow squash, tomato sauce/puree, turnips, yams, and zucchini. Keep servings limited to  cup.  Fruits All fruit juices except prune juice. Cooked or canned fruits without skin and seeds, such as applesauce, apricots, cherries, fruit cocktail, grapefruit, grapes, mandarin oranges, melons, peaches, pears, pineapple, and plums. Fresh  fruits without skin, such as apricots, avocados, bananas, melons, pineapple, nectarines, and peaches. Keep servings limited to  cup or 1 piece.  Meat and Other Protein Sources Ground or well-cooked tender beef, ham, veal, lamb, pork, or poultry. Eggs, plain cheese. Fish, oysters, shrimp, lobster, and other seafood. Liver, organ meats. Smooth nut butters. Dairy All milk products and alternative dairy substitutes, such as soy, rice, almond, and coconut, not containing added whole nuts, seeds, or added fruit. Beverages Decaf coffee, fruit, and vegetable juices or smoothies (small amounts, with no pulp or skins, and with fruits from allowed list), sports drinks, herbal tea. Condiments Ketchup, mustard, vinegar, cream sauce, cheese sauce, cocoa powder. Spices in moderation, such as allspice, basil, bay leaves, celery powder or leaves, cinnamon, cumin powder, curry powder, ginger, mace, marjoram, onion or garlic powder, oregano, paprika, parsley flakes, ground pepper, rosemary, sage, savory, tarragon, thyme, and turmeric. Sweets and Desserts Plain cakes and cookies, pie made with allowed fruit, pudding, custard, cream pie. Gelatin, fruit, ice, sherbet, frozen ice pops. Ice cream, ice milk without nuts. Plain hard candy, honey, jelly, molasses, syrup, sugar, chocolate syrup, gumdrops, marshmallows. Limit overall sugar intake.  Fats and Oil Margarine, butter, cream, mayonnaise, salad oils, plain salad dressings made from allowed foods. Choose healthy fats such as olive oil, canola oil, and omega-3 fatty acids (such as found in salmon or tuna) when possible.  Other Bouillon, broth, or cream soups made from allowed foods. Any strained soup. Casseroles or mixed dishes made  with allowed foods. The items listed above may not be a complete list of recommended foods or beverages. Contact your dietitian for more options.  WHAT FOODS ARE NOT RECOMMENDED? Grains All whole wheat and whole grain breads and crackers.  Multigrains, rye, bran seeds, nuts, or coconut. Cereals containing whole grains, multigrains, bran, coconut, nuts, raisins. Cooked or dry oatmeal, steel-cut oats. Coarse wheat cereals, granola. Cereals advertised as high fiber. Potato skins. Whole grain pasta, wild or brown rice. Popcorn. Coconut flour. Bran, buckwheat, corn bread, multigrains, rye, wheat germ.  Vegetables Fresh, cooked or canned vegetables, such as artichokes, asparagus, beet greens, broccoli, Brussels sprouts, cabbage, celery, cauliflower, corn, eggplant, kale, legumes or beans, okra, peas, and tomatoes. Avoid large servings of any vegetables, especially raw vegetables.  Fruits Fresh fruits, such as apples with or without skin, berries, cherries, figs, grapes, grapefruit, guavas, kiwis, mangoes, oranges, papayas, pears, persimmons, pineapple, and pomegranate. Prune juice and juices with pulp, stewed or dried prunes. Dried fruits, dates, raisins. Fruit seeds or skins. Avoid large servings of all fresh fruits. Meats and Other Protein Sources Tough, fibrous meats with gristle. Chunky nut butter. Cheese made with seeds, nuts, or other foods not recommended. Nuts, seeds, legumes (beans, including baked beans), dried peas, beans, lentils.  Dairy Yogurt or cheese that contains nuts, seeds, or added fruit.  Beverages Fruit juices with high pulp, prune juice. Caffeinated coffee and teas.  Condiments Coconut, maple syrup, pickles, olives. Sweets and Desserts Desserts, cookies, or candies that contain nuts or coconut, chunky peanut butter, dried fruits. Jams, preserves with seeds, marmalade. Large amounts of sugar and sweets. Any other dessert made with fruits from the not recommended list.  Other Soups made from vegetables that are not recommended or that contain other foods not recommended.  The items listed above may not be a complete list of foods and beverages to avoid. Contact your dietitian for more information. Document Released:  07/08/2001 Document Revised: 01/21/2013 Document Reviewed: 12/09/2012 [facility] Patient Information 2015 [place], [place]. This information is not intended to replace advice given to you by your health care provider. Make sure you discuss any questions you have with your health care provider.

## 2014-08-03 NOTE — Progress Notes (Signed)
  Subjective: Feels better. No n/v. +bm. Had a little pain after breakfast this am but resolve.d   Objective: Vital signs in last 24 hours: Temp:  [98.4 F (36.9 C)-98.6 F (37 C)] 98.6 F (37 C) (07/04 0537) Pulse Rate:  [85-94] 85 (07/04 0537) Resp:  [18] 18 (07/04 0537) BP: (128-140)/(58-81) 128/70 mmHg (07/04 0537) SpO2:  [98 %-100 %] 98 % (07/04 0537) Last BM Date: 08/02/14  Intake/Output from previous day: 07/03 0701 - 07/04 0700 In: 480 [P.O.:480] Out: -  Intake/Output this shift: Total I/O In: 120 [P.O.:120] Out: -   Alert, nad, nontoxic Soft, nd, nt  Lab Results:   Recent Labs  08/03/14 0520  WBC 7.2  HGB 11.8*  HCT 36.2*  PLT 338   BMET  Recent Labs  08/03/14 0520  NA 132*  K 4.6  CL 97*  CO2 26  GLUCOSE 108*  BUN 10  CREATININE 0.91  CALCIUM 9.0   PT/INR No results for input(s): LABPROT, INR in the last 72 hours. ABG No results for input(s): PHART, HCO3 in the last 72 hours.  Invalid input(s): PCO2, PO2  Studies/Results: No results found.  Anti-infectives: Anti-infectives    Start     Dose/Rate Route Frequency Ordered Stop   08/03/14 0000  ciprofloxacin (CIPRO) 500 MG tablet     500 mg Oral 2 times daily 08/03/14 0940     08/03/14 0000  metroNIDAZOLE (FLAGYL) 500 MG tablet     500 mg Oral Every 8 hours 08/03/14 0940     08/01/14 2200  ciprofloxacin (CIPRO) tablet 500 mg     500 mg Oral 2 times daily 08/01/14 1258     08/01/14 1400  metroNIDAZOLE (FLAGYL) tablet 500 mg     500 mg Oral 3 times per day 08/01/14 1258     07/31/14 0600  metroNIDAZOLE (FLAGYL) IVPB 500 mg  Status:  Discontinued     500 mg 100 mL/hr over 60 Minutes Intravenous 3 times per day 07/30/14 2205 08/01/14 1258   07/30/14 2359  ciprofloxacin (CIPRO) IVPB 400 mg  Status:  Discontinued     400 mg 200 mL/hr over 60 Minutes Intravenous Every 12 hours 07/30/14 2231 08/01/14 1258   07/30/14 2015  cefTRIAXone (ROCEPHIN) 1 g in dextrose 5 % 50 mL IVPB     1 g 100  mL/hr over 30 Minutes Intravenous  Once 07/30/14 2000 07/30/14 2123   07/30/14 2015  metroNIDAZOLE (FLAGYL) IVPB 500 mg  Status:  Discontinued     500 mg 100 mL/hr over 60 Minutes Intravenous  Once 07/30/14 2000 07/30/14 2205      Assessment/Plan: Sigmoid diverticulitis with 2 small abscesses  Tolerated solids. No fever. No tachycardia. No wbc. Min pain.  Ok with discharge Discussed importance of proper food choices Will need at least 1 week of oral abx F/u with Dr Marcello Moores or myself at [place] Will need outpt colonoscopy in 6-8 weeks  Leighton Ruff. Redmond Pulling, MD, FACS General, Bariatric, & Minimally Invasive Surgery [facility], [place]    LOS: 3 days    Gayland Curry 08/03/2014

## 2014-08-03 NOTE — Discharge Summary (Signed)
Discharge Summary  Joseph Moyer:[id number] DOB: 08-Nov-1940  PCP: Joseph Hams, MD  Admit date: 07/30/2014 Discharge date: 08/03/2014  Time spent: 25 minutes  Recommendations for Outpatient Follow-up:  1. New medication: Ambien 5 mg by mouth daily at bedtime 2. New medication: Cipro 500 mg by mouth twice a day 4 days 3. New medication: Flagyl 500 mg every 8 hours 4 days  Discharge Diagnoses:  Active Hospital Problems   Diagnosis Date Noted  . Acute diverticulitis 07/30/2014  . Overweight (BMI 25.0-29.9) 07/31/2014  . Hyponatremia 07/30/2014  . Hyperlipidemia 07/30/2014  . HTN (hypertension) 07/30/2014    Resolved Hospital Problems   Diagnosis Date Noted Date Resolved  No resolved problems to display.    Discharge Condition:  Improved, being discharged home Diet recommendation: low sodium low residue diet  Filed Weights   07/30/14 2150  Weight: 90.946 kg (200 lb 8 oz)    History of present illness:  74 year old male with past history of hypertension admitted on 6/30 for left lower quadrant pain and found to have new onset diverticulitis with several contained abdominal abscesses. Made nothing by mouth and started on IV Cipro and Flagyl.   Hospital Course:  Principal Problem:   Acute diverticulitis: Asian seen by general surgery who recommended conservative management. Patient was able to advance diet to clear liquids on 7/2 over the next 2 days, advanced to solid food without incident. Plan will be to discharge patient on by mouth Cipro and Flagyl for total of 7 days therapy. He will follow-up with his PCP in the next month and at that time, an outpatient follow-up colonoscopy can be scheduled. Patient has a routine one scheduled for next February although it would be preferred if he have one sooner. Information given to patient for low residue diet plus diverticulitis  Active Problems:   Hyponatremia: Secondary dehydration, resolved with IV fluids   Hyperlipidemia    HTN (hypertension): Initially meds held secondary dehydration. Resumed on discharge   Overweight (BMI 25.0-29.9): Patient is criteria BMI greater than 25  Sleep disturbance: Patient has complained of not being on the Cipro for a long time. Tried Ambien on night of 7/3 with good success. Requested prescription which has been given.   Procedures:  None  Consultations:  General surgery  Discharge Exam: BP 128/70 mmHg  Pulse 85  Temp(Src) 98.6 F (37 C) (Oral)  Resp 18  Ht 5\' 11"  (1.803 m)  Wt 90.946 kg (200 lb 8 oz)  BMI 27.98 kg/m2  SpO2 98%  General: Alert and oriented 3, no acute distress Cardiovascular: Regular rate and rhythm, S1-S2 Respiratory: Clear to auscultation bilaterally Abdomen: Soft, nontender on left side, nondistended, normoactive bowel sounds  Discharge Instructions You were cared for by a hospitalist during your hospital stay. If you have any questions about your discharge medications or the care you received while you were in the hospital after you are discharged, you can call the unit and asked to speak with the hospitalist on call if the hospitalist that took care of you is not available. Once you are discharged, your primary care physician will handle any further medical issues. Please note that NO REFILLS for any discharge medications will be authorized once you are discharged, as it is imperative that you return to your primary care physician (or establish a relationship with a primary care physician if you do not have one) for your aftercare needs so that they can reassess your need for medications and monitor your lab values.  Discharge Instructions    Diet - low sodium heart healthy    Complete by:  As directed      Increase activity slowly    Complete by:  As directed             Medication List    STOP taking these medications        traMADol 50 MG tablet  Commonly known as:  ULTRAM      TAKE these medications         aspirin 81 MG tablet  Take 81 mg by mouth daily.     ciprofloxacin 500 MG tablet  Commonly known as:  CIPRO  Take 1 tablet (500 mg total) by mouth 2 (two) times daily.     losartan 25 MG tablet  Commonly known as:  COZAAR  Take 25 mg by mouth daily.     metroNIDAZOLE 500 MG tablet  Commonly known as:  FLAGYL  Take 1 tablet (500 mg total) by mouth every 8 (eight) hours.     simvastatin 20 MG tablet  Commonly known as:  ZOCOR  Take 20 mg by mouth daily.     zolpidem 5 MG tablet  Commonly known as:  AMBIEN  Take 1 tablet (5 mg total) by mouth at bedtime.       No Known Allergies     Follow-up Information    Follow up with Joseph D, MD In 10 days.   Specialty:  Internal Medicine   Why:  As scheduled on 7/15   Contact information:   [address] [place] [address] [phone]        The results of significant diagnostics from this hospitalization (including imaging, microbiology, ancillary and laboratory) are listed below for reference.    Significant Diagnostic Studies: [name]  07/24/2014   CLINICAL DATA:  Low back pain  EXAM: MRI LUMBAR SPINE WITHOUT CONTRAST  TECHNIQUE: Multiplanar, multisequence MR imaging of the lumbar spine was performed. No intravenous contrast was administered.  COMPARISON:  Lumbar radiographs 01/07/2014. No prior MRI for comparison.  FINDINGS: Normal lumbar alignment. Negative for fracture or mass. Conus medullaris normal and terminates at L1-2.  L1-2: Mild facet hypertrophy bilaterally with mild narrowing of the canal.  L2-3: Mild to moderate central disc protrusion with upgoing disc material. Moderate facet and ligamentum flavum hypertrophy with moderate spinal stenosis. Subarticular and foraminal stenosis bilaterally  L3-4: Moderate disc degeneration and spondylosis with diffuse endplate osteophyte formation. Moderate facet hypertrophy bilaterally. Mild to moderate spinal stenosis. Mild foraminal  stenosis bilaterally  L4-5: Disc degeneration with fatty changes in the endplates. Prominent posterior osteophyte formation left greater than right. Moderate foraminal narrowing left greater than right due to spurring. Mild spinal stenosis. Subarticular stenosis on the left could cause impingement of the left L5 nerve root.  L5-S1: Disc degeneration with moderate spondylosis causing moderate foraminal encroachment bilaterally.  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 Subarticular stenosis on the left could cause impingement of the left L5 nerve root  Spondylosis L5-S1 causing moderate foraminal encroachment bilaterally.   Electronically Signed   By: Franchot Gallo M.Moyer.   On: 07/24/2014 14:18   Ct Abdomen Pelvis W Contrast  07/30/2014   CLINICAL DATA:  Abdominal pain for 5 days with bloating  EXAM:  CT ABDOMEN AND PELVIS WITH CONTRAST  TECHNIQUE: Multidetector CT imaging of the abdomen and pelvis was performed using the standard protocol following bolus administration of intravenous contrast.  CONTRAST:  32mL OMNIPAQUE IOHEXOL 300 MG/ML SOLN, 166mL OMNIPAQUE IOHEXOL 300 MG/ML SOLN  COMPARISON:  07/30/14  FINDINGS: Lung bases are well aerated with evidence of mild right basilar infiltrate. No sizable effusion is seen.  The liver, spleen, adrenal glands and pancreas are within normal limits. The gallbladder is well distended and demonstrates a single dependent gallstone without complicating factors. Kidneys are well visualized bilaterally without renal calculi or obstructive changes. Ureters are within normal limits to the urinary bladder which is partially distended.  There are changes of diverticulosis throughout the colon. In the mid descending colon there is evidence of  diverticulitis with inflammation of adjacent small bowel loops. There are at least 2 air-fluid collections identified in the left lateral abdomen. One is best seen on image number 55 of series 2 measuring 2.6 x 3.3 cm consistent with a focal abscess. On coronal images it has a somewhat bilobed appearance draped over the adjacent colon. The craniocaudad measurement is approximately 8 cm. A second is noted along the mesenteric border of the: Measuring 3.1 by 2.6 cm. This is best seen on image number 64 of series  The appendix is within normal limits. No other focal abnormality is seen. 2. No significant free air is seen. No other definitive abscess is noted.  IMPRESSION: Diverticulitis with 2 pericolonic abscesses as described. Although not mentioned in the body of the report there are some areas of fluid attenuation within the wall of the colon which may represent some intramural abscess formation.  Cholelithiasis without complicating factors.  Right basilar infiltrate.   Electronically Signed   By: Inez Catalina M.Moyer.   On: 07/30/2014 19:46   Dg Abd Acute W/chest  07/30/2014   CLINICAL DATA:  Abdominal pain  EXAM: DG ABDOMEN ACUTE W/ 1V CHEST  COMPARISON:  Lumbar spine radiographs 01/07/2014 at [place] at [place]: There is no evidence of dilated bowel loops or free intraperitoneal air. No radiopaque calculi or other significant radiographic abnormality is seen. Heart size and mediastinal contours are within normal limits. Both lungs are clear. Probable gallstone reidentified over the right upper quadrant. Renal calculus could appear similar.  IMPRESSION: Negative abdominal radiographs.  No acute cardiopulmonary disease.   Electronically Signed   By: Conchita Paris M.Moyer.   On: 07/30/2014 14:11    Microbiology: No results found for this or any previous visit (from the past 240 hour(s)).   Labs: Basic Metabolic Panel:  Recent Labs Lab 07/30/14 1640 07/30/14 2246  07/31/14 0511 08/03/14 0520  NA 134*  --  131* 132*  K 3.9  --  3.7 4.6  CL 95*  --  97* 97*  CO2 27  --  25 26  GLUCOSE 94  --  112* 108*  BUN 19  --  13 10  CREATININE 0.88 0.83 0.84 0.91  CALCIUM 8.9  --  8.2* 9.0   Liver Function Tests:  Recent Labs Lab 07/30/14 1640 07/31/14 0511  AST 46* 28  ALT 95* 69*  ALKPHOS 128* 97  BILITOT 0.6 0.7  PROT 8.9* 7.0  ALBUMIN 3.6 2.9*    Recent Labs Lab 07/30/14 1640  LIPASE 15*   No results for input(s): AMMONIA in the last 168 hours. CBC:  Recent Labs Lab 07/30/14 1403 07/30/14 2246 07/31/14 0511 08/03/14 0520  WBC 9.4  8.0 7.3 7.2  HGB 12.5* 11.4* 10.9* 11.8*  HCT 38.9* 34.5* 32.7* 36.2*  MCV 85.2 85.2 85.2 85.8  PLT  --  196 198 338   Cardiac Enzymes: No results for input(s): CKTOTAL, CKMB, CKMBINDEX, TROPONINI in the last 168 hours. BNP: BNP (last 3 results) No results for input(s): BNP in the last 8760 hours.  ProBNP (last 3 results) No results for input(s): PROBNP in the last 8760 hours.  CBG:  Recent Labs Lab 07/31/14 0751 08/01/14 0756 08/02/14 0730 08/03/14 0737  GLUCAP 92 126* 125* 103*       Signed:  Neely Cecena K  [facility] 08/03/2014, 1:34 PM

## 2014-08-03 NOTE — Progress Notes (Signed)
Nutrition Education Note  RD consulted for nutrition education. Pt with hx of HTN; talked with him about Low-Sodium diet  RD provided "Low Sodium Nutrition Therapy" and "Sodium-Free Seasoning Ideas" handouts from the Academy of [employer] and Dietetics. Reviewed patient's dietary recall. Provided examples on ways to decrease sodium intake in diet. Discouraged intake of processed foods and use of salt shaker. Encouraged fresh fruits and vegetables as well as whole grain sources of carbohydrates to maximize fiber intake.   RD discussed why it is important for patient to adhere to diet recommendations, and emphasized the role of fluids, foods to avoid, and importance of weighing self daily. Teach back method used.  Pt reports he knew he was eating high sodium-containing foods PTA and has been wanting to make a change. He has not had education on this topic in the past. Pt is appreciative of handouts and information. He asks how he will know how much sodium he is consuming/day and RD talked with him about using food labels and restaurant web sites.  Expect good to fair compliance.  Body mass index is 27.98 kg/(m^2). Pt meets criteria for overweight status based on current BMI.  Current diet order is 2 gm Na, patient is consuming approximately 50-100% of meals at this time. Labs and medications reviewed. No further nutrition interventions warranted at this time. RD contact information provided. If additional nutrition issues arise, please re-consult RD.     Jarome Matin, RD, LDN Inpatient Clinical Dietitian Pager # [phone] After hours/weekend pager # [phone]

## 2014-08-14 DIAGNOSIS — K5792 Diverticulitis of intestine, part unspecified, without perforation or abscess without bleeding: Secondary | ICD-10-CM | POA: Diagnosis not present

## 2014-08-31 ENCOUNTER — Other Ambulatory Visit: Payer: Self-pay | Admitting: Internal Medicine

## 2014-08-31 DIAGNOSIS — K5780 Diverticulitis of intestine, part unspecified, with perforation and abscess without bleeding: Secondary | ICD-10-CM | POA: Diagnosis not present

## 2014-09-02 ENCOUNTER — Other Ambulatory Visit: Payer: Commercial Managed Care - HMO

## 2014-09-02 ENCOUNTER — Ambulatory Visit
Admission: RE | Admit: 2014-09-02 | Discharge: 2014-09-02 | Disposition: A | Discharge status: Home / Self Care | Payer: Commercial Managed Care - HMO | Source: Ambulatory Visit | Attending: Internal Medicine | Admitting: Internal Medicine

## 2014-09-02 DIAGNOSIS — N400 Enlarged prostate without lower urinary tract symptoms: Secondary | ICD-10-CM | POA: Diagnosis not present

## 2014-09-02 DIAGNOSIS — K5780 Diverticulitis of intestine, part unspecified, with perforation and abscess without bleeding: Secondary | ICD-10-CM

## 2014-09-02 DIAGNOSIS — K8020 Calculus of gallbladder without cholecystitis without obstruction: Secondary | ICD-10-CM | POA: Diagnosis not present

## 2014-09-02 MED ORDER — IOPAMIDOL (ISOVUE-300) INJECTION 61%
125.0000 mL | Freq: Once | INTRAVENOUS | Status: AC | PRN
  Administered 2014-09-02: 125 mL via INTRAVENOUS

## 2014-11-06 DIAGNOSIS — E7800 Pure hypercholesterolemia, unspecified: Secondary | ICD-10-CM | POA: Diagnosis not present

## 2014-11-06 DIAGNOSIS — G4700 Insomnia, unspecified: Secondary | ICD-10-CM | POA: Diagnosis not present

## 2014-11-06 DIAGNOSIS — I10 Essential (primary) hypertension: Secondary | ICD-10-CM | POA: Diagnosis not present

## 2014-11-06 DIAGNOSIS — Z23 Encounter for immunization: Secondary | ICD-10-CM | POA: Diagnosis not present

## 2014-12-29 ENCOUNTER — Ambulatory Visit (INDEPENDENT_AMBULATORY_CARE_PROVIDER_SITE_OTHER): Payer: Commercial Managed Care - HMO | Admitting: Family Medicine

## 2014-12-29 VITALS — BP 138/84 | HR 93 | Temp 98.4°F | Resp 16 | Ht 71.25 in | Wt 210.6 lb

## 2014-12-29 DIAGNOSIS — L03221 Cellulitis of neck: Secondary | ICD-10-CM | POA: Diagnosis not present

## 2014-12-29 DIAGNOSIS — R21 Rash and other nonspecific skin eruption: Secondary | ICD-10-CM

## 2014-12-29 MED ORDER — CEPHALEXIN 500 MG PO CAPS: 500.0000 mg | ORAL_CAPSULE | Freq: Three times a day (TID) | ORAL | Status: DC

## 2014-12-29 MED ORDER — VALACYCLOVIR HCL 1 G PO TABS: 1000.0000 mg | ORAL_TABLET | Freq: Three times a day (TID) | ORAL | Status: DC

## 2014-12-29 NOTE — Patient Instructions (Signed)
Cellulitis Cellulitis is an infection of the skin and the tissue beneath it. The infected area is usually red and tender. Cellulitis occurs most often in the arms and lower legs.  CAUSES  Cellulitis is caused by bacteria that enter the skin through cracks or cuts in the skin. The most common types of bacteria that cause cellulitis are staphylococci and streptococci. SIGNS AND SYMPTOMS   Redness and warmth.  Swelling.  Tenderness or pain.  Fever. DIAGNOSIS  Your health care provider can usually determine what is wrong based on a physical exam. Blood tests may also be done. TREATMENT  Treatment usually involves taking an antibiotic medicine. HOME CARE INSTRUCTIONS   Take your antibiotic medicine as directed by your health care provider. Finish the antibiotic even if you start to feel better.  Keep the infected arm or leg elevated to reduce swelling.  Apply a warm cloth to the affected area up to 4 times per day to relieve pain.  Take medicines only as directed by your health care provider.  Keep all follow-up visits as directed by your health care provider. SEEK MEDICAL CARE IF:   You notice red streaks coming from the infected area.  Your red area gets larger or turns dark in color.  Your bone or joint underneath the infected area becomes painful after the skin has healed.  Your infection returns in the same area or another area.  You notice a swollen bump in the infected area.  You develop new symptoms.  You have a fever. SEEK IMMEDIATE MEDICAL CARE IF:   You feel very sleepy.  You develop vomiting or diarrhea.  You have a general ill feeling (malaise) with muscle aches and pains.   This information is not intended to replace advice given to you by your health care provider. Make sure you discuss any questions you have with your health care provider.   Document Released: 10/26/2004 Document Revised: 10/07/2014 Document Reviewed: 04/03/2011 Elsevier Interactive  Patient Education 2016 [place] is an infection that causes a painful skin rash and fluid-filled blisters. Shingles is caused by the same virus that causes chickenpox. Shingles only develops in people who:  Have had chickenpox.  Have gotten the chickenpox vaccine. (This is rare.) The first symptoms of shingles may be itching, tingling, or pain in an area on your skin. A rash will follow in a few days or weeks. The rash is usually on one side of the body in a bandlike or beltlike pattern. Over time, the rash turns into fluid-filled blisters that break open, scab over, and dry up. Medicines may:  Help you manage pain.  Help you recover more quickly.  Help to prevent long-term problems. HOME CARE Medicines  Take medicines only as told by your doctor.  Apply an anti-itch or numbing cream to the affected area as told by your doctor. Blister and Rash Care  Take a cool bath or put cool compresses on the area of the rash or blisters as told by your doctor. This may help with pain and itching.  Keep your rash covered with a loose bandage (dressing). Wear loose-fitting clothing.  Keep your rash and blisters clean with mild soap and cool water or as told by your doctor.  Check your rash every day for signs of infection. These include redness, swelling, and pain that lasts or gets worse.  Do not pick your blisters.  Do not scratch your rash. General Instructions  Rest as told by your doctor.  Keep all follow-up visits as told by your doctor. This is important.  Until your blisters scab over, your infection can cause chickenpox in people who have never had it or been vaccinated against it. To prevent this from happening, avoid touching other people or being around other people, especially:  Babies.  Pregnant women.  Children who have eczema.  Elderly people who have transplants.  People who have chronic illnesses, such as leukemia or AIDS. GET HELP  IF:  Your pain does not get better with medicine.  Your pain does not get better after the rash heals.  Your rash looks infected. Signs of infection include:  Redness.  Swelling.  Pain that lasts or gets worse. GET HELP RIGHT AWAY IF:  The rash is on your face or nose.  You have pain in your face, pain around your eye area, or loss of feeling on one side of your face.  You have ear pain or you have ringing in your ear.  You have loss of taste.  Your condition gets worse.   This information is not intended to replace advice given to you by your health care provider. Make sure you discuss any questions you have with your health care provider.   Document Released: 07/05/2007 Document Revised: 02/06/2014 Document Reviewed: 10/28/2013 Elsevier Interactive Patient Education [employer].

## 2014-12-29 NOTE — Progress Notes (Signed)
Chief Complaint:  Chief Complaint  Patient presents with  . Rash    neck he took robaxin yesterday stopped  . Arm Pain    L ARM X2 WEEKS    HPI: Joseph Moyer is a 74 y.o. male who reports to [facility] today complaining of 2-3 day hx of neck rash and has some tingling in back of neck radiating down arm for 2 weeks.  It is near his hairline but he has no haur, he has been scratching it but he denies any pimples or cuts that may have triggered this. He has no fevers or chills. He has no immune compromise, no DM. No HA vision changes, slurred speech , rash anywhere else, no CVA sxs He has had shingles vaccine before  NKI to arm He has a hx of arthritis, THN and hyperlipidemia, on statinbu tno prior msk aches with statin He has full ROM, he denies weakness  Past Medical History  Diagnosis Date  . Allergy   . Arthritis   . Hypertension   . Hyperlipidemia    History reviewed. No pertinent past surgical history. Social History   Social History  . Marital Status: Married    Spouse Name: N/A  . Number of Children: N/A  . Years of Education: N/A   Social History Main Topics  . Smoking status: Never Smoker   . Smokeless tobacco: None  . Alcohol Use: None  . Drug Use: None  . Sexual Activity: Not Asked   Other Topics Concern  . None   Social History Narrative   Family History  Problem Relation Age of Onset  . Diabetes Mother   . Diabetes Sister   . Diabetes Brother    No Known Allergies Prior to Admission medications   Medication Sig Start Date End Date Taking? Authorizing Provider  aspirin 81 MG tablet Take 81 mg by mouth daily.   Yes Historical Provider, MD  losartan (COZAAR) 25 MG tablet Take 25 mg by mouth daily. 06/18/14  Yes Historical Provider, MD  methocarbamol (ROBAXIN) 500 MG tablet Take 500 mg by mouth 4 (four) times daily.   Yes Historical Provider, MD  simvastatin (ZOCOR) 20 MG tablet Take 20 mg by mouth daily.   Yes Historical Provider, MD  zolpidem  (AMBIEN) 5 MG tablet Take 1 tablet (5 mg total) by mouth at bedtime. 08/03/14  Yes Annita Brod, MD     ROS: The patient denies fevers, chills, night sweats, unintentional weight loss, chest pain, palpitations, wheezing, dyspnea on exertion, nausea, vomiting, abdominal pain, dysuria, hematuria, melena,  weakness.   All other systems have been reviewed and were otherwise negative with the exception of those mentioned in the HPI and as above.    PHYSICAL EXAM: Filed Vitals:   12/29/14 1711  BP: 138/84  Pulse: 93  Temp: 98.4 F (36.9 C)  Resp: 16   Body mass index is 29.16 kg/(m^2).   General: Alert, no acute distress HEENT:  Normocephalic, atraumatic, oropharynx patent. EOMI, PERRLA Fundo exam normal, no lesions on nose or face Cardiovascular:  Regular rate and rhythm, no rubs murmurs or gallops.  No Carotid bruits, radial pulse intact. No pedal edema.  Respiratory: Clear to auscultation bilaterally.  No wheezes, rales, or rhonchi.  No cyanosis, no use of accessory musculature Abdominal: No organomegaly, abdomen is soft and non-tender, positive bowel sounds. No masses. Skin: + erythematous vesicles, minimal harness, midline c spine. Full ROM of neck.  Neurologic: Facial musculature symmetric. Psychiatric:  Patient acts appropriately throughout our interaction. Lymphatic: No cervical or submandibular lymphadenopathy Musculoskeletal: Gait intact. No edema, tenderness Neck exam normal-neg spurling Shoulder No deformity, no hypertrophy/atrophy, no erythema, no fluid, no wounds Full ROM Nontender at [facility] j[facility] Neg Empty Can test, neg Lift off test, neg Speeds, Neg Hawkins/Neers 5/5 strength, 2/2 triceps and biceps DTRs  LABS:  EKG/XRAY:   Primary read interpreted by Dr. Marin Comment at [facility].   ASSESSMENT/PLAN: Encounter Diagnoses  Name Primary?  . Rash and nonspecific skin eruption Yes  . Cellulitis of neck    I am not sure if this is an early shingles rash or cellulitis. Will treat  for both Rx valtrex and Keflex The rash is mdiline on his neck, he has pain and burning and tingling pain going down his arm. The area has vesicile but there is some erythema and hardness without fluctuance which may be early abscess formation and inflammationon c spine Will cont to monitor, fu prn      Gross sideeffects, risk and benefits, and alternatives of medications d/w patient. Patient is aware that all medications have potential sideeffects and we are unable to predict every sideeffect or drug-drug interaction that may occur.  [name] DO  01/03/2015 4:56 AM

## 2015-01-03 ENCOUNTER — Encounter: Payer: Self-pay | Admitting: Family Medicine

## 2015-02-08 ENCOUNTER — Other Ambulatory Visit: Payer: Self-pay | Admitting: Family Medicine

## 2015-02-08 NOTE — Telephone Encounter (Signed)
Pt called to check the status of Rx refills for valACYclovir (VALTREX) 1000 MG tablet [id number] and cephALEXin (KEFLEX) 500 MG capsule [id number] please contact when ready for pick up//   Pharmacy:  [id number] - [place], [place] [phone]

## 2015-02-09 ENCOUNTER — Telehealth: Payer: Self-pay | Admitting: *Deleted

## 2015-02-09 NOTE — Telephone Encounter (Signed)
How are symptoms? A message will need to be sent to doctor to see if we can refill or he will need to RTC

## 2015-02-10 ENCOUNTER — Telehealth: Payer: Self-pay | Admitting: *Deleted

## 2015-02-10 NOTE — Telephone Encounter (Signed)
Patient is calling looking for refills on Keflex and Valtrex?

## 2015-02-10 NOTE — Telephone Encounter (Signed)
Valtrex and keflex were prescribed for an acute issue. If he feels he is not better, he needs to return to clinic for further evaluation.

## 2015-02-11 NOTE — Telephone Encounter (Signed)
Pt advised.

## 2015-03-10 ENCOUNTER — Ambulatory Visit (INDEPENDENT_AMBULATORY_CARE_PROVIDER_SITE_OTHER): Payer: Commercial Managed Care - HMO | Admitting: Emergency Medicine

## 2015-03-10 ENCOUNTER — Ambulatory Visit (INDEPENDENT_AMBULATORY_CARE_PROVIDER_SITE_OTHER): Payer: Commercial Managed Care - HMO

## 2015-03-10 VITALS — BP 124/78 | HR 88 | Temp 98.4°F | Resp 17 | Ht 70.5 in | Wt 210.0 lb

## 2015-03-10 DIAGNOSIS — L919 Hypertrophic disorder of the skin, unspecified: Secondary | ICD-10-CM | POA: Diagnosis not present

## 2015-03-10 DIAGNOSIS — R21 Rash and other nonspecific skin eruption: Secondary | ICD-10-CM

## 2015-03-10 DIAGNOSIS — L089 Local infection of the skin and subcutaneous tissue, unspecified: Secondary | ICD-10-CM

## 2015-03-10 DIAGNOSIS — L918 Other hypertrophic disorders of the skin: Secondary | ICD-10-CM | POA: Diagnosis not present

## 2015-03-10 DIAGNOSIS — M50322 Other cervical disc degeneration at C5-C6 level: Secondary | ICD-10-CM | POA: Diagnosis not present

## 2015-03-10 DIAGNOSIS — M25512 Pain in left shoulder: Secondary | ICD-10-CM

## 2015-03-10 DIAGNOSIS — M79602 Pain in left arm: Secondary | ICD-10-CM

## 2015-03-10 DIAGNOSIS — M79632 Pain in left forearm: Secondary | ICD-10-CM | POA: Diagnosis not present

## 2015-03-10 MED ORDER — BETAMETHASONE DIPROPIONATE AUG 0.05 % EX CREA: TOPICAL_CREAM | Freq: Two times a day (BID) | CUTANEOUS | Status: DC

## 2015-03-10 MED ORDER — MELOXICAM 7.5 MG PO TABS: 7.5000 mg | ORAL_TABLET | Freq: Every day | ORAL | Status: DC

## 2015-03-10 MED ORDER — GABAPENTIN 100 MG PO CAPS: ORAL_CAPSULE | ORAL | Status: DC

## 2015-03-10 NOTE — Patient Instructions (Addendum)
Because you received an x-ray today, you will receive an invoice from [facility]. Please contact [facility] at [phone] with questions or concerns regarding your invoice. Our billing staff will not be able to assist you with those questions.  Please keep the area where you had your skin tag removed clean with soap and water. Apply a sterile Band-Aid every day.    Cervical Radiculopathy Cervical radiculopathy happens when a nerve in the neck (cervical nerve) is pinched or bruised. This condition can develop because of an injury or as part of the normal aging process. Pressure on the cervical nerves can cause pain or numbness that runs from the neck all the way down into the arm and fingers. Usually, this condition gets better with rest. Treatment may be needed if the condition does not improve.  CAUSES This condition may be caused by:  Injury.  Slipped (herniated) disk.  Muscle tightness in the neck because of overuse.  Arthritis.  Breakdown or degeneration in the bones and joints of the spine (spondylosis) due to aging.  Bone spurs that may develop near the cervical nerves. SYMPTOMS Symptoms of this condition include:  Pain that runs from the neck to the arm and hand. The pain can be severe or irritating. It may be worse when the neck is moved.  Numbness or weakness in the affected arm and hand. DIAGNOSIS This condition may be diagnosed based on symptoms, medical history, and a physical exam. You may also have tests, including:  X-rays.  CT scan.  MRI.  Electromyogram (EMG).  Nerve conduction tests. TREATMENT In many cases, treatment is not needed for this condition. With rest, the condition usually gets better over time. If treatment is needed, options may include:  Wearing a soft neck collar for short periods of time.  Physical therapy to strengthen your neck muscles.  Medicines, such as NSAIDs, oral corticosteroids, or spinal  injections.  Surgery. This may be needed if other treatments do not help. Various types of surgery may be done depending on the cause of your problems. HOME CARE INSTRUCTIONS Managing Pain  Take over-the-counter and prescription medicines only as told by your health care provider.  If directed, apply ice to the affected area.  Put ice in a plastic bag.  Place a towel between your skin and the bag.  Leave the ice on for 20 minutes, 2-3 times per day.  If ice does not help, you can try using heat. Take a warm shower or warm bath, or use a heat pack as told by your health care provider.  Try a gentle neck and shoulder massage to help relieve symptoms. Activity  Rest as needed. Follow instructions from your health care provider about any restrictions on activities.  Do stretching and strengthening exercises as told by your health care provider or physical therapist. General Instructions  If you were given a soft collar, wear it as told by your health care provider.  Use a flat pillow when you sleep.  Keep all follow-up visits as told by your health care provider. This is important. SEEK MEDICAL CARE IF:  Your condition does not improve with treatment. SEEK IMMEDIATE MEDICAL CARE IF:  Your pain gets much worse and cannot be controlled with medicines.  You have weakness or numbness in your hand, arm, face, or leg.  You have a high fever.  You have a stiff, rigid neck.  You lose control of your bowels or your bladder (have incontinence).  You have trouble with walking,  balance, or speaking.   This information is not intended to replace advice given to you by your health care provider. Make sure you discuss any questions you have with your health care provider.   Document Released: 10/11/2000 Document Revised: 10/07/2014 Document Reviewed: 03/12/2014 Elsevier Interactive Patient Education [employer].

## 2015-03-10 NOTE — Progress Notes (Addendum)
Patient ID: Joseph Moyer, male   DOB: 1940-08-21, 75 y.o.   MRN: [id number]    By signing my name below, I, Essence Howell, attest that this documentation has been prepared under the direction and in the presence of Darlyne Russian, MD Electronically Signed: Ladene Artist, ED Scribe 03/10/2015 at 10:09 AM.  Chief Complaint:  Chief Complaint  Patient presents with  . Arm Pain    left arm    HPI: Joseph Moyer is a 75 y.o. male, with a h/o arthritis, who reports to [facility] today complaining of persistent left arm pain for several months. Pt reports intermittent "deep", aching pain in his left upper arm into his lower arm. Pain is temporarily improved with squeezing his upper arm. He was seen in the office in May 2016 for similar pain in his right arm which has resolved.   Skin Tags Pt also presents with multiple skin tags to his back. He has tried tying a string around the area without significant relief.   Rash Pt also presents with recurrent pruritic rash to bilateral arms. He was seen in the office for the same on 12/29/14 and started on Keflex. Pt states that rash resolved but occasionally returns.   Past Medical History  Diagnosis Date  . Allergy   . Arthritis   . Hypertension   . Hyperlipidemia    No past surgical history on file. Social History   Social History  . Marital Status: Married    Spouse Name: N/A  . Number of Children: N/A  . Years of Education: N/A   Social History Main Topics  . Smoking status: Never Smoker   . Smokeless tobacco: None  . Alcohol Use: None  . Drug Use: None  . Sexual Activity: Not Asked   Other Topics Concern  . None   Social History Narrative   Family History  Problem Relation Age of Onset  . Diabetes Mother   . Diabetes Sister   . Diabetes Brother    No Known Allergies Prior to Admission medications   Medication Sig Start Date End Date Taking? Authorizing Provider  aspirin 81 MG tablet Take 81 mg by mouth daily.   Yes  Historical Provider, MD  losartan (COZAAR) 25 MG tablet Take 25 mg by mouth daily. 06/18/14  Yes Historical Provider, MD  simvastatin (ZOCOR) 20 MG tablet Take 20 mg by mouth daily.   Yes Historical Provider, MD   ROS: The patient denies fevers, chills, night sweats, unintentional weight loss, chest pain, palpitations, wheezing, dyspnea on exertion, nausea, vomiting, abdominal pain, dysuria, hematuria, melena, numbness, weakness, or tingling. +myalgias, +rash  All other systems have been reviewed and were otherwise negative with the exception of those mentioned in the HPI and as above.    PHYSICAL EXAM: Filed Vitals:   03/10/15 0940  BP: 124/78  Pulse: 88  Temp: 98.4 F (36.9 C)  Resp: 17   Body mass index is 29.7 kg/(m^2).  General: Alert, no acute distress HEENT:  Normocephalic, atraumatic, oropharynx patent. Eye: EOMI, [facility] Neck: Full ROM of neck. Cardiovascular:  Regular rate and rhythm, no rubs murmurs or gallops. No Carotid bruits, radial pulse intact. No pedal edema.  Respiratory: Clear to auscultation bilaterally. No wheezes, rales, or rhonchi. No cyanosis, no use of accessory musculature Abdominal: No organomegaly, abdomen is soft and non-tender, positive bowel sounds. No masses. Musculoskeletal: Gait intact. No edema. L arm strength 5/5.  Skin: No rashes. 1 cm skin tag R flank area with a  string around it.  Neurologic: Facial musculature symmetric. 2+ biceps reflexes, absent triceps and brachioradialis.  Psychiatric: Patient acts appropriately throughout our interaction. Lymphatic: No cervical or submandibular lymphadenopathy   PROCEDURE NOTE   The skin tag was prepped with Betadine 2. The area was swabbed with alcohol swab. The area beneath the skin tag was numb with 1 mL of 2% plain. 15 blade was used and the lesion was removed and placed in formalin to be sent for pathology. Base cauterized with silver nitrate. Patient tolerated procedure well.  LABS:  EKG/XRAY:     Primary read interpreted by Dr. Everlene Farrier at [facility]. 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 I suspect he has a cervical radiculopathy down the left arm. Will treat with Mobic one a day and low-dose Neurontin recheck 2 weeks. He may need an MRI.Dg Cervical Spine Complete  03/10/2015  CLINICAL DATA:  Neck pain, no injury EXAM: CERVICAL SPINE - COMPLETE 4+ VIEW COMPARISON:  None. FINDINGS: The cervical vertebrae are straightened in alignment. There is degenerative disc disease at C3-4 and C5-6 levels, where there is loss of disc space and sclerosis with spurring. No prevertebral soft tissue swelling is seen. No fracture is noted. On oblique views, which are suboptimal, no significant foraminal narrowing is evident. The odontoid process is intact. The lung apices are clear. IMPRESSION: Straightened alignment with degenerative disc disease at C3-4 and C5-6. The oblique views are suboptimal. Electronically Signed   By: Ivar Drape M.D.   On: 03/10/2015 11:09   Dg Forearm Left  03/10/2015  CLINICAL DATA:  75 year old male with left forearm pain for several months, intermittent deep, aching pain throughout the left upper extremity. Initial encounter. EXAM: LEFT FOREARM - 2 VIEW COMPARISON:  Left shoulder series today reported separately. FINDINGS: Bone mineralization is within normal limits for age. Mildly prominent anterior fat pad at the elbow raising the possibility of joint effusion. Joint spaces and alignment at the left elbow appear normal for age. Mild chronic appearing changes at the medial epicondyles. No forearm fracture identified. Grossly normal alignment at the left wrist. IMPRESSION: No acute osseous abnormality identified. Possible left elbow joint effusion. Electronically Signed   By: Genevie Ann M.D.   On: 03/10/2015 11:07   Dg Shoulder Left  03/10/2015  CLINICAL DATA:  Pain.  No known injury. EXAM: LEFT SHOULDER - 2+ VIEW COMPARISON:  None. FINDINGS: Frontal, Y  scapular, and axillary images were obtained. There is no demonstrable fracture or dislocation. Joint spaces appear intact. No erosive change or intra-articular calcification. IMPRESSION: No fracture or dislocation.  No apparent arthropathy. Electronically Signed   By: Lowella Grip III M.D.   On: 03/10/2015 11:12   ASSESSMENT/PLAN: Patient has radiculopathy down the left arm. Will treat with Modic and Neurontin the lesion removed will be sent for pathology. We'll recheck in 2 weeks regarding his radiculopathy left arm.I personally performed the services described in this documentation, which was scribed in my presence. The recorded information has been reviewed and is accurate.Johney Maine sideeffects, risk and benefits, and alternatives of medications d/w patient. Patient is aware that all medications have potential sideeffects and we are unable to predict every sideeffect or drug-drug interaction that may occur.  Arlyss Queen MD 03/10/2015 9:58 AM

## 2015-03-16 ENCOUNTER — Telehealth: Payer: Self-pay | Admitting: Physician Assistant

## 2015-03-16 NOTE — Telephone Encounter (Signed)
Patient made aware via phone.  Philis Fendt, MS, PA-C 4:31 PM, 03/16/2015

## 2015-03-16 NOTE — Telephone Encounter (Signed)
-----   Message from Darlyne Russian, MD sent at 03/12/2015  5:46 PM EST ----- Call lesion benign

## 2015-03-24 ENCOUNTER — Other Ambulatory Visit: Payer: Self-pay | Admitting: Gastroenterology

## 2015-03-26 ENCOUNTER — Ambulatory Visit (INDEPENDENT_AMBULATORY_CARE_PROVIDER_SITE_OTHER): Payer: Commercial Managed Care - HMO | Admitting: Emergency Medicine

## 2015-03-26 VITALS — BP 124/82 | HR 90 | Temp 98.5°F | Resp 18 | Wt 219.0 lb

## 2015-03-26 DIAGNOSIS — M79602 Pain in left arm: Secondary | ICD-10-CM | POA: Diagnosis not present

## 2015-03-26 DIAGNOSIS — M5010 Cervical disc disorder with radiculopathy, unspecified cervical region: Secondary | ICD-10-CM | POA: Diagnosis not present

## 2015-03-26 MED ORDER — GABAPENTIN 100 MG PO CAPS: ORAL_CAPSULE | ORAL | Status: DC

## 2015-03-26 MED ORDER — MELOXICAM 7.5 MG PO TABS: ORAL_TABLET | ORAL | Status: DC

## 2015-03-26 NOTE — Progress Notes (Signed)
By signing my name below, I, Moises Blood, attest that this documentation has been prepared under the direction and in the presence of Arlyss Queen, MD. Electronically Signed: Moises Blood, [place]. 03/26/2015 , 4:16 PM .  Patient was seen in room 10 .  Chief Complaint:  Chief Complaint  Patient presents with  . Follow-up    for pain in left arm    HPI: Joseph Moyer is a 75 y.o. male who reports to [facility] today for follow up of left arm pain. He was initially here about 2 weeks ago (03/10/15) for arm pain. He was prescribed mobic, neurontin and diprolene cream. He states that the pain has improved 50% today. He denies any complications with the medications.   Past Medical History  Diagnosis Date  . Allergy   . Arthritis   . Hypertension   . Hyperlipidemia    History reviewed. No pertinent past surgical history. Social History   Social History  . Marital Status: Married    Spouse Name: N/A  . Number of Children: N/A  . Years of Education: N/A   Social History Main Topics  . Smoking status: Never Smoker   . Smokeless tobacco: None  . Alcohol Use: None  . Drug Use: None  . Sexual Activity: Not Asked   Other Topics Concern  . None   Social History Narrative   Family History  Problem Relation Age of Onset  . Diabetes Mother   . Diabetes Sister   . Diabetes Brother    No Known Allergies Prior to Admission medications   Medication Sig Start Date End Date Taking? Authorizing Provider  aspirin 81 MG tablet Take 81 mg by mouth daily.   Yes Historical Provider, MD  augmented betamethasone dipropionate (DIPROLENE AF) 0.05 % cream Apply topically 2 (two) times daily. 03/10/15  Yes Darlyne Russian, MD  gabapentin (NEURONTIN) 100 MG capsule Take 1-2 capsules at night. If tolerated you can take 1-2 capsules in the morning and 1-2 capsules in the early afternoon. Please caution for drowsiness. 03/10/15  Yes Darlyne Russian, MD  losartan (COZAAR) 25 MG tablet Take 25 mg by mouth  daily. 06/18/14  Yes Historical Provider, MD  meloxicam (MOBIC) 7.5 MG tablet Take 1 tablet (7.5 mg total) by mouth daily. 03/10/15  Yes Darlyne Russian, MD  simvastatin (ZOCOR) 20 MG tablet Take 20 mg by mouth daily.   Yes Historical Provider, MD     ROS:  Constitutional: negative for fever, chills, night sweats, weight changes, or fatigue  HEENT: negative for vision changes, hearing loss, congestion, rhinorrhea, ST, epistaxis, or sinus pressure Cardiovascular: negative for chest pain or palpitations Respiratory: negative for hemoptysis, wheezing, shortness of breath, or cough Abdominal: negative for abdominal pain, nausea, vomiting, diarrhea, or constipation Dermatological: negative for rash Musc: positive for myalgia (left arm) Neurologic: negative for headache, dizziness, or syncope All other systems reviewed and are otherwise negative with the exception to those above and in the HPI.  PHYSICAL EXAM: Filed Vitals:   03/26/15 1556  BP: 124/82  Pulse: 90  Temp: 98.5 F (36.9 C)  Resp: 18   Body mass index is 30.97 kg/(m^2).   General: Alert, no acute distress HEENT:  Normocephalic, atraumatic, oropharynx patent. Eye: [facility] Cardiovascular:  Regular rate and rhythm, no rubs murmurs or gallops.  No Carotid bruits, radial pulse intact. No pedal edema.  Respiratory: Clear to auscultation bilaterally.  No wheezes, rales, or rhonchi.  No cyanosis, no use of accessory  musculature Abdominal: No organomegaly, abdomen is soft and non-tender, positive bowel sounds. No masses. Musculoskeletal: Discomfort over proximal left forearm, no weakness, reflexes 1+ Skin: No rashes. Neurologic: Facial musculature symmetric. Psychiatric: Patient acts appropriately throughout our interaction.  Lymphatic: No cervical or submandibular lymphadenopathy Genitourinary/Anorectal: No acute findings   LABS:   EKG/XRAY:   Primary read interpreted by Dr. Everlene Farrier at [facility].   ASSESSMENT/PLAN:   patient  is 50% bette. I increased his Neurontin 100 mg capsules 2 to-3 capsules 3 times a day. He can take his Mobitz 7.5 one to 2 a day. He will call if symptoms persist after 10 days and will proceed with MRI of cervical spine.I personally performed the services described in this documentation, which was scribed in my presence. The recorded information has been reviewed and is accurate.  Gross sideeffects, risk and benefits, and alternatives of medications d/w patient. Patient is aware that all medications have potential sideeffects and we are unable to predict every sideeffect or drug-drug interaction that may occur.  Arlyss Queen MD 03/26/2015 4:16 PM

## 2015-03-26 NOTE — Patient Instructions (Signed)
You can increase your Neurontin to 2-3 capsules during the day and 2-3 capsules at night. If necessary you can take an extra mobic for a total daily dose of 15 mg.  Do not take any Advil or Aleve while you are on these medications.

## 2015-04-26 ENCOUNTER — Telehealth: Payer: Self-pay

## 2015-04-26 DIAGNOSIS — M5010 Cervical disc disorder with radiculopathy, unspecified cervical region: Secondary | ICD-10-CM

## 2015-04-26 NOTE — Telephone Encounter (Signed)
Pt states Dr Everlene Farrier told him if no better to call him back and he didn't have to come in and pt is still having the same problems as before. Please call [phone]     [facility] [address]

## 2015-04-27 ENCOUNTER — Encounter (HOSPITAL_COMMUNITY): Payer: Self-pay | Admitting: *Deleted

## 2015-04-27 NOTE — Telephone Encounter (Signed)
ASSESSMENT/PLAN:  patient is 50% bette. I increased his Neurontin 100 mg capsules 2 to-3 capsules 3 times a day. He can take his Mobitz 7.5 one to 2 a day. He will call if symptoms persist after 10 days and will proceed with MRI of cervical spine.I personally performed the services described in this documentation, which was scribed in my presence. The recorded information has been reviewed and is accurate.  Gross sideeffects, risk and benefits, and alternatives of medications d/w patient. Patient is aware that all medications have potential sideeffects and we are unable to predict every sideeffect or drug-drug interaction that may occur.     MRI pended.

## 2015-04-27 NOTE — Telephone Encounter (Signed)
Thank you Tamara!

## 2015-04-28 NOTE — Telephone Encounter (Signed)
Pt.notified

## 2015-05-10 ENCOUNTER — Ambulatory Visit (HOSPITAL_COMMUNITY): Payer: Commercial Managed Care - HMO | Admitting: Certified Registered Nurse Anesthetist

## 2015-05-10 ENCOUNTER — Encounter (HOSPITAL_COMMUNITY): Payer: Self-pay | Admitting: *Deleted

## 2015-05-10 ENCOUNTER — Ambulatory Visit (HOSPITAL_COMMUNITY)
Admission: RE | Admit: 2015-05-10 | Discharge: 2015-05-10 | Disposition: A | Discharge status: Home / Self Care | Payer: Commercial Managed Care - HMO | Source: Ambulatory Visit | Attending: Gastroenterology | Admitting: Gastroenterology

## 2015-05-10 ENCOUNTER — Encounter (HOSPITAL_COMMUNITY)
Admission: RE | Disposition: A | Discharge status: Home / Self Care | Payer: Self-pay | Source: Ambulatory Visit | Attending: Gastroenterology

## 2015-05-10 DIAGNOSIS — Z79899 Other long term (current) drug therapy: Secondary | ICD-10-CM | POA: Diagnosis not present

## 2015-05-10 DIAGNOSIS — Z1211 Encounter for screening for malignant neoplasm of colon: Secondary | ICD-10-CM | POA: Insufficient documentation

## 2015-05-10 DIAGNOSIS — I10 Essential (primary) hypertension: Secondary | ICD-10-CM | POA: Diagnosis not present

## 2015-05-10 DIAGNOSIS — D123 Benign neoplasm of transverse colon: Secondary | ICD-10-CM | POA: Insufficient documentation

## 2015-05-10 DIAGNOSIS — K635 Polyp of colon: Secondary | ICD-10-CM | POA: Diagnosis not present

## 2015-05-10 DIAGNOSIS — K5790 Diverticulosis of intestine, part unspecified, without perforation or abscess without bleeding: Secondary | ICD-10-CM | POA: Diagnosis not present

## 2015-05-10 HISTORY — PX: COLONOSCOPY WITH PROPOFOL: SHX5780

## 2015-05-10 SURGERY — COLONOSCOPY WITH PROPOFOL
Anesthesia: Monitor Anesthesia Care

## 2015-05-10 MED ORDER — ONDANSETRON HCL 4 MG/2ML IJ SOLN
INTRAMUSCULAR | Status: DC | PRN
  Administered 2015-05-10: 4 mg via INTRAVENOUS

## 2015-05-10 MED ORDER — ONDANSETRON HCL 4 MG/2ML IJ SOLN
INTRAMUSCULAR | Status: AC
Start: 2015-05-10 — End: 2015-05-10
  Filled 2015-05-10: qty 2

## 2015-05-10 MED ORDER — PROPOFOL 500 MG/50ML IV EMUL
INTRAVENOUS | Status: DC | PRN
  Administered 2015-05-10: 75 ug/kg/min via INTRAVENOUS

## 2015-05-10 MED ORDER — SODIUM CHLORIDE 0.9 % IV SOLN: INTRAVENOUS | Status: DC

## 2015-05-10 MED ORDER — LIDOCAINE HCL (CARDIAC) 20 MG/ML IV SOLN
INTRAVENOUS | Status: AC
  Filled 2015-05-10: qty 5

## 2015-05-10 MED ORDER — LIDOCAINE HCL (CARDIAC) 20 MG/ML IV SOLN
INTRAVENOUS | Status: DC | PRN
  Administered 2015-05-10: 100 mg via INTRAVENOUS

## 2015-05-10 MED ORDER — PROPOFOL 10 MG/ML IV BOLUS
INTRAVENOUS | Status: AC
  Filled 2015-05-10: qty 60

## 2015-05-10 MED ORDER — PROPOFOL 10 MG/ML IV BOLUS
INTRAVENOUS | Status: DC | PRN
  Administered 2015-05-10: 30 mg via INTRAVENOUS
  Administered 2015-05-10: 20 mg via INTRAVENOUS
  Administered 2015-05-10: 10 mg via INTRAVENOUS
  Administered 2015-05-10: 20 mg via INTRAVENOUS

## 2015-05-10 MED ORDER — LACTATED RINGERS IV SOLN
INTRAVENOUS | Status: DC
  Administered 2015-05-10: 1000 mL via INTRAVENOUS

## 2015-05-10 SURGICAL SUPPLY — 21 items

## 2015-05-10 NOTE — H&P (Signed)
  Procedure: Screening colonoscopy. 09/02/2014 CT scan of the abdomen and pelvis showed resolving left colonic diverticulitis. 03/15/2010 normal screening colonoscopy was performed. 09/08/2003 normal screening colonoscopy was performed.  History: The patient is a 75 year old male born August 12, 1940. He is scheduled to undergo a repeat screening colonoscopy today. He received treatment for left colonic diverticulitis in August 2016. He reports no recurrent abdominal pain.  Past medical history: Hemorrhoidectomy. Hypercholesterolemia. Hypertension. Cataracts. Spinal stenosis.  Medication allergies: Ramipril  Exam: The patient is alert and lying comfortably on the endoscopy stretcher. Abdomen is soft and nontender to palpation. Lungs are clear to auscultation. Cardiac exam reveals a regular rhythm.  Plan: Proceed with screening colonoscopy

## 2015-05-10 NOTE — Transfer of Care (Signed)
Immediate Anesthesia Transfer of Care Note  Patient: Joseph Moyer  Procedure(s) Performed: Procedure(s): COLONOSCOPY WITH PROPOFOL (N/A)  Patient Location: ENDO  Anesthesia Type:MAC  Level of Consciousness:  sedated, patient cooperative and responds to stimulation  Airway & Oxygen Therapy:Patient Spontanous Breathing and Patient connected to face mask oxgen  Post-op Assessment:  Report given to ENDO RN and Post -op Vital signs reviewed and stable  Post vital signs:  Reviewed and stable  Last Vitals:  Filed Vitals:   05/10/15 0735  BP: 163/94  Pulse: 79  Temp: 36.7 C  Resp: 19    Complications: No apparent anesthesia complications

## 2015-05-10 NOTE — Op Note (Signed)
[facility] Patient Name: Joseph Moyer Procedure Date: 05/10/2015 MRN: [id number] Attending MD: Garlan Fair , MD Date of Birth: 19-Dec-1940 CSN:  Age: 75 Admit Type: Outpatient Procedure:                Colonoscopy Indications:              Screening for colorectal malignant neoplasm Providers:                Garlan Fair, MD, Laverta Baltimore, RN, Corliss Parish, Technician, Adair Laundry, CRNA Referring MD:              Medicines:                Propofol per Anesthesia Complications:            No immediate complications. Estimated Blood Loss:     Estimated blood loss: none. Procedure:                Pre-Anesthesia Assessment:                           - Prior to the procedure, a History and Physical                            was performed, and patient medications and                            allergies were reviewed. The patient's tolerance of                            previous anesthesia was also reviewed. The risks                            and benefits of the procedure and the sedation                            options and risks were discussed with the patient.                            All questions were answered, and informed consent                            was obtained. Prior Anticoagulants: The patient has                            taken aspirin, last dose was day of procedure. ASA                            Grade Assessment: III - A patient with severe                            systemic disease. After reviewing the risks and  benefits, the patient was deemed in satisfactory                            condition to undergo the procedure.                           After obtaining informed consent, the colonoscope                            was passed under direct vision. Throughout the                            procedure, the patient's blood pressure, pulse, and                             oxygen saturations were monitored continuously. The                            [id number]) scope was introduced through                            the anus and advanced to the the cecum, identified                            by appendiceal orifice and ileocecal valve. The                            colonoscopy was performed without difficulty. The                            patient tolerated the procedure well. The quality                            of the bowel preparation was good. The ileocecal                            valve, the appendiceal orifice and the rectum were                            photographed. Scope In: 9:05:50 AM Scope Out: 9:22:51 AM Scope Withdrawal Time: 0 hours 10 minutes 0 seconds  Total Procedure Duration: 0 hours 17 minutes 1 second  Findings:      The perianal and digital rectal examinations were normal.      A 3 mm polyp was found in the hepatic flexure. The polyp was sessile.       The polyp was removed with a cold biopsy forceps. Resection and       retrieval were complete.      The entire examined colon otherwise appeared normal. Diverticula were       present in the sigmoid colon, descending colon, ascending colon, cecum. Impression:               - One 3 mm polyp at the hepatic flexure, removed  with a cold biopsy forceps. Resected and retrieved.                           - The entire examined colon is normal. Moderate Sedation:      N/A- Per Anesthesia Care Recommendation:           - Patient has a contact number available for                            emergencies. The signs and symptoms of potential                            delayed complications were discussed with the                            patient. Return to normal activities tomorrow.                            Written discharge instructions were provided to the                            patient.                           - Await pathology  results.                           - Repeat colonoscopy date to be determined after                            pending pathology results are reviewed for                            surveillance based on pathology results.                           - Resume previous diet.                           - Continue present medications. Procedure Code(s):        --- Professional ---                           [phone], Colonoscopy, flexible; with biopsy, single                            or multiple Diagnosis Code(s):        --- Professional ---                           Z12.11, Encounter for screening for malignant                            neoplasm of colon                           D12.3, Benign neoplasm of  transverse colon (hepatic                            flexure or splenic flexure) CPT copyright 2016 American Medical Association. All rights reserved. The codes documented in this report are preliminary and upon coder review may  be revised to meet current compliance requirements. Earle Gell, MD Garlan Fair, MD 05/10/2015 9:30:41 AM This report has been signed electronically. Number of Addenda: 0

## 2015-05-10 NOTE — Anesthesia Postprocedure Evaluation (Signed)
Anesthesia Post Note  Patient: Joseph Moyer  Procedure(s) Performed: Procedure(s) (LRB): COLONOSCOPY WITH PROPOFOL (N/A)  Patient location during evaluation: Endoscopy Anesthesia Type: MAC Level of consciousness: awake and alert Pain management: pain level controlled Vital Signs Assessment: post-procedure vital signs reviewed and stable Respiratory status: spontaneous breathing, nonlabored ventilation, respiratory function stable and patient connected to nasal cannula oxygen Cardiovascular status: stable and blood pressure returned to baseline Anesthetic complications: no    Last Vitals:  Filed Vitals:   05/10/15 0940 05/10/15 0950  BP: 161/107 157/108  Pulse: 86 73  Temp:    Resp: 23 18    Last Pain: There were no vitals filed for this visit.               Montez Hageman

## 2015-05-10 NOTE — Discharge Instructions (Signed)

## 2015-05-10 NOTE — Anesthesia Preprocedure Evaluation (Signed)
Anesthesia Evaluation  Patient identified by MRN, date of birth, ID band Patient awake    Reviewed: Allergy & Precautions, NPO status , Patient's Chart, lab work & pertinent test results  Airway Mallampati: II  TM Distance: >3 FB Neck ROM: Full    Dental no notable dental hx.    Pulmonary neg pulmonary ROS,    Pulmonary exam normal breath sounds clear to auscultation       Cardiovascular hypertension, Pt. on medications Normal cardiovascular exam Rhythm:Regular Rate:Normal     Neuro/Psych negative neurological ROS  negative psych ROS   GI/Hepatic negative GI ROS, Neg liver ROS,   Endo/Other  negative endocrine ROS  Renal/GU negative Renal ROS  negative genitourinary   Musculoskeletal negative musculoskeletal ROS (+)   Abdominal   Peds negative pediatric ROS (+)  Hematology negative hematology ROS (+)   Anesthesia Other Findings   Reproductive/Obstetrics negative OB ROS                             Anesthesia Physical Anesthesia Plan  ASA: II  Anesthesia Plan: MAC   Post-op Pain Management:    Induction: Intravenous  Airway Management Planned: Simple Face Mask  Additional Equipment:   Intra-op Plan:   Post-operative Plan:   Informed Consent: I have reviewed the patients History and Physical, chart, labs and discussed the procedure including the risks, benefits and alternatives for the proposed anesthesia with the patient or authorized representative who has indicated his/her understanding and acceptance.   Dental advisory given  Plan Discussed with: CRNA  Anesthesia Plan Comments:         Anesthesia Quick Evaluation  

## 2015-05-11 DIAGNOSIS — E7800 Pure hypercholesterolemia, unspecified: Secondary | ICD-10-CM | POA: Diagnosis not present

## 2015-05-11 DIAGNOSIS — Z0000 Encounter for general adult medical examination without abnormal findings: Secondary | ICD-10-CM | POA: Diagnosis not present

## 2015-05-11 DIAGNOSIS — I10 Essential (primary) hypertension: Secondary | ICD-10-CM | POA: Diagnosis not present

## 2015-05-11 DIAGNOSIS — Z125 Encounter for screening for malignant neoplasm of prostate: Secondary | ICD-10-CM | POA: Diagnosis not present

## 2015-05-12 ENCOUNTER — Encounter (HOSPITAL_COMMUNITY): Payer: Self-pay | Admitting: Gastroenterology

## 2015-05-19 ENCOUNTER — Ambulatory Visit
Admission: RE | Admit: 2015-05-19 | Discharge: 2015-05-19 | Disposition: A | Discharge status: Home / Self Care | Payer: Commercial Managed Care - HMO | Source: Ambulatory Visit | Attending: Emergency Medicine | Admitting: Emergency Medicine

## 2015-05-19 DIAGNOSIS — M50221 Other cervical disc displacement at C4-C5 level: Secondary | ICD-10-CM | POA: Diagnosis not present

## 2015-05-19 DIAGNOSIS — M5010 Cervical disc disorder with radiculopathy, unspecified cervical region: Secondary | ICD-10-CM

## 2015-05-19 DIAGNOSIS — M50222 Other cervical disc displacement at C5-C6 level: Secondary | ICD-10-CM | POA: Diagnosis not present

## 2015-05-19 DIAGNOSIS — M50223 Other cervical disc displacement at C6-C7 level: Secondary | ICD-10-CM | POA: Diagnosis not present

## 2015-05-20 ENCOUNTER — Telehealth: Payer: Self-pay

## 2015-05-20 ENCOUNTER — Other Ambulatory Visit: Payer: Self-pay

## 2015-05-20 DIAGNOSIS — M5010 Cervical disc disorder with radiculopathy, unspecified cervical region: Secondary | ICD-10-CM

## 2015-05-20 NOTE — Telephone Encounter (Signed)
Spoke with patient @ cell [phone]. Gave message per Dr. Everlene Farrier re: results of MRI.   Pt requested results be sent to his PCP Dr. Delfina Redwood.   Pt also agreed to have neurosurgeon referral to Dr. Newman Pies or Dr. Leeroy Cha.  Advised pt both will be done and he should hear from the neurosurgeons within the next few days. ------

## 2015-05-20 NOTE — Telephone Encounter (Signed)
Patient spoke to [facility] and is calling back because he didn't understand everything she said. Please call! [phone]

## 2015-05-24 NOTE — Telephone Encounter (Signed)
Called pt and advised message from Dr. Everlene Farrier. He will await neurosurgery appt.

## 2015-06-24 DIAGNOSIS — M542 Cervicalgia: Secondary | ICD-10-CM | POA: Diagnosis not present

## 2015-06-24 DIAGNOSIS — M4802 Spinal stenosis, cervical region: Secondary | ICD-10-CM | POA: Diagnosis not present

## 2015-06-24 DIAGNOSIS — Z6830 Body mass index (BMI) 30.0-30.9, adult: Secondary | ICD-10-CM | POA: Diagnosis not present

## 2015-08-19 DIAGNOSIS — M65352 Trigger finger, left little finger: Secondary | ICD-10-CM | POA: Diagnosis not present

## 2015-08-19 DIAGNOSIS — M65331 Trigger finger, right middle finger: Secondary | ICD-10-CM | POA: Diagnosis not present

## 2015-09-21 DIAGNOSIS — M65331 Trigger finger, right middle finger: Secondary | ICD-10-CM | POA: Diagnosis not present

## 2015-09-21 DIAGNOSIS — M65352 Trigger finger, left little finger: Secondary | ICD-10-CM | POA: Diagnosis not present

## 2015-11-11 DIAGNOSIS — E7800 Pure hypercholesterolemia, unspecified: Secondary | ICD-10-CM | POA: Diagnosis not present

## 2015-11-11 DIAGNOSIS — I10 Essential (primary) hypertension: Secondary | ICD-10-CM | POA: Diagnosis not present

## 2015-11-11 DIAGNOSIS — Z23 Encounter for immunization: Secondary | ICD-10-CM | POA: Diagnosis not present

## 2015-11-11 DIAGNOSIS — M25561 Pain in right knee: Secondary | ICD-10-CM | POA: Diagnosis not present

## 2016-04-14 ENCOUNTER — Other Ambulatory Visit: Payer: Self-pay | Admitting: Emergency Medicine

## 2016-04-19 DIAGNOSIS — H5213 Myopia, bilateral: Secondary | ICD-10-CM | POA: Diagnosis not present

## 2016-04-19 DIAGNOSIS — H52209 Unspecified astigmatism, unspecified eye: Secondary | ICD-10-CM | POA: Diagnosis not present

## 2016-04-19 DIAGNOSIS — H524 Presbyopia: Secondary | ICD-10-CM | POA: Diagnosis not present

## 2016-05-12 DIAGNOSIS — I10 Essential (primary) hypertension: Secondary | ICD-10-CM | POA: Diagnosis not present

## 2016-05-12 DIAGNOSIS — Z0000 Encounter for general adult medical examination without abnormal findings: Secondary | ICD-10-CM | POA: Diagnosis not present

## 2016-05-12 DIAGNOSIS — Z1389 Encounter for screening for other disorder: Secondary | ICD-10-CM | POA: Diagnosis not present

## 2016-05-12 DIAGNOSIS — Z125 Encounter for screening for malignant neoplasm of prostate: Secondary | ICD-10-CM | POA: Diagnosis not present

## 2016-05-12 DIAGNOSIS — E291 Testicular hypofunction: Secondary | ICD-10-CM | POA: Diagnosis not present

## 2016-05-12 DIAGNOSIS — E7800 Pure hypercholesterolemia, unspecified: Secondary | ICD-10-CM | POA: Diagnosis not present

## 2016-10-27 ENCOUNTER — Encounter: Payer: Self-pay | Admitting: Urgent Care

## 2016-10-27 ENCOUNTER — Ambulatory Visit (INDEPENDENT_AMBULATORY_CARE_PROVIDER_SITE_OTHER): Payer: Commercial Managed Care - HMO

## 2016-10-27 ENCOUNTER — Ambulatory Visit (INDEPENDENT_AMBULATORY_CARE_PROVIDER_SITE_OTHER): Payer: Commercial Managed Care - HMO | Admitting: Urgent Care

## 2016-10-27 VITALS — BP 130/80 | HR 88 | Temp 98.0°F | Resp 16 | Ht 70.0 in | Wt 212.6 lb

## 2016-10-27 DIAGNOSIS — M5136 Other intervertebral disc degeneration, lumbar region: Secondary | ICD-10-CM

## 2016-10-27 DIAGNOSIS — M25562 Pain in left knee: Secondary | ICD-10-CM

## 2016-10-27 DIAGNOSIS — M25551 Pain in right hip: Secondary | ICD-10-CM

## 2016-10-27 DIAGNOSIS — M25552 Pain in left hip: Secondary | ICD-10-CM | POA: Diagnosis not present

## 2016-10-27 DIAGNOSIS — M47816 Spondylosis without myelopathy or radiculopathy, lumbar region: Secondary | ICD-10-CM | POA: Diagnosis not present

## 2016-10-27 MED ORDER — PREDNISONE 20 MG PO TABS: ORAL_TABLET | ORAL | 0 refills | Status: DC

## 2016-10-27 NOTE — Patient Instructions (Addendum)
Degenerative Disk Disease Degenerative disk disease is a condition caused by the changes that occur in spinal disks as you grow older. Spinal disks are soft and compressible disks located between the bones of your spine (vertebrae). These disks act like shock absorbers. Degenerative disk disease can affect the whole spine. However, the neck and lower back are most commonly affected. Many changes can occur in the spinal disks with aging, such as:  The spinal disks may dry and shrink.  Small tears may occur in the tough, outer covering of the disk (annulus).  The disk space may become smaller due to loss of water.  Abnormal growths in the bone (spurs) may occur. This can put pressure on the nerve roots exiting the spinal canal, causing pain.  The spinal canal may become narrowed.  What increases the risk?  Being overweight.  Having a family history of degenerative disk disease.  Smoking.  There is increased risk if you are doing heavy lifting or have a sudden injury. What are the signs or symptoms? Symptoms vary from person to person and may include:  Pain that varies in intensity. Some people have no pain, while others have severe pain. The location of the pain depends on the part of your backbone that is affected. ? You will have neck or arm pain if a disk in the neck area is affected. ? You will have pain in your back, buttocks, or legs if a disk in the lower back is affected.  Pain that becomes worse while bending, reaching up, or with twisting movements.  Pain that may start gradually and then get worse as time passes. It may also start after a major or minor injury.  Numbness or tingling in the arms or legs.  How is this diagnosed? Your health care provider will ask you about your symptoms and about activities or habits that may cause the pain. He or she may also ask about any injuries, diseases, or treatments you have had. Your health care provider will examine you to check  for the range of movement that is possible in the affected area, to check for strength in your extremities, and to check for sensation in the areas of the arms and legs supplied by different nerve roots. You may also have:  An X-ray of the spine.  Other imaging tests, such as MRI.  How is this treated? Your health care provider will advise you on the best plan for treatment. Treatment may include:  Medicines.  Rehabilitation exercises.  Follow these instructions at home:  Follow proper lifting and walking techniques as advised by your health care provider.  Maintain good posture.  Exercise regularly as advised by your health care provider.  Perform relaxation exercises.  Change your sitting, standing, and sleeping habits as advised by your health care provider.  Change positions frequently.  Lose weight or maintain a healthy weight as advised by your health care provider.  Do not use any tobacco products, including cigarettes, chewing tobacco, or electronic cigarettes. If you need help quitting, ask your health care provider.  Wear supportive footwear.  Take medicines only as directed by your health care provider. Contact a health care provider if:  Your pain does not go away within 1-4 weeks.  You have significant appetite or weight loss. Get help right away if:  Your pain is severe.  You notice weakness in your arms, hands, or legs.  You begin to lose control of your bladder or bowel movements.  You have   fevers or night sweats. This information is not intended to replace advice given to you by your health care provider. Make sure you discuss any questions you have with your health care provider. Document Released: 11/13/2006 Document Revised: 06/24/2015 Document Reviewed: 05/20/2013 Elsevier Interactive Patient Education  2018 [employer].     IF you received an x-ray today, you will receive an invoice from [facility]. Please contact [facility]  Radiology at [phone] with questions or concerns regarding your invoice.   IF you received labwork today, you will receive an invoice from [facility]. Please contact [facility] at [phone] with questions or concerns regarding your invoice.   Our billing staff will not be able to assist you with questions regarding bills from these companies.  You will be contacted with the lab results as soon as they are available. The fastest way to get your results is to activate your My Chart account. Instructions are located on the last page of this paperwork. If you have not heard from [place] regarding the results in 2 weeks, please contact this office.

## 2016-10-27 NOTE — Progress Notes (Signed)
  MRN: [id number] DOB: 05-14-1940  Subjective:   Joseph Moyer is a 76 y.o. male presenting for chief complaint of Hip Pain (off/on for a long time; mostly left but in right as well) and Knee Pain (left knee)  Reports >1 year history of intermittent bilateral hip pain. This current episode has lasted 2 weeks, left worse than right, had sharp, constant pain last night radiating into his left thigh and knee. Denies falls, trauma, redness, swelling, low back pain, weakness, warmth. He has tried meloxicam 15mg  with minimal relief. Has a history of lumbar spondylosis and DDD.  Joseph Moyer has a current medication list which includes the following prescription(s): aspirin, losartan, and simvastatin. Also has No Known Allergies.  Joseph Moyer  has a past medical history of Allergy; Arthritis; Hyperlipidemia; and Hypertension. Also  has a past surgical history that includes Circumcision; Hemorrhoid surgery; and Colonoscopy with propofol (N/A, 05/10/2015).  Objective:   Vitals: BP 130/80   Pulse 88   Temp 98 F (36.7 C) (Oral)   Resp 16   Ht 5\' 10"  (1.778 m)   Wt 212 lb 9.6 oz (96.4 kg)   SpO2 99%   BMI 30.50 kg/m   Physical Exam  Constitutional: He is oriented to person, place, and time. He appears well-developed and well-nourished.  Cardiovascular: Normal rate.   Pulmonary/Chest: Effort normal.  Musculoskeletal:       Right hip: He exhibits tenderness. He exhibits normal range of motion, normal strength, no bony tenderness, no swelling, no crepitus, no deformity and no laceration.       Left hip: He exhibits tenderness. He exhibits normal range of motion, normal strength, no bony tenderness, no swelling, no crepitus, no deformity and no laceration.       Left knee: He exhibits normal range of motion, no swelling, no effusion, no ecchymosis, no deformity, no erythema, normal alignment, normal patellar mobility and no bony tenderness. No tenderness found.       Legs: Neurological: He is alert and  oriented to person, place, and time.   Dg Hips Bilat W Or W/o Pelvis 2v  Result Date: 10/27/2016 CLINICAL DATA:  Bilateral hip pain left greater than right EXAM: DG HIP (WITH OR WITHOUT PELVIS) 2V BILAT COMPARISON:  CT 09/02/2014 FINDINGS: SI joints are patent bilaterally. Pubic symphysis appears intact. No fracture or malalignment. Joint spaces are preserved. Calcified pelvic phleboliths. IMPRESSION: Negative. Electronically Signed   By: Donavan Foil M.D.   On: 10/27/2016 15:31   Assessment and Plan :   1. Bilateral hip pain 2. Lumbar spondylosis 3. Lumbar degenerative disc disease 4. Acute pain of left knee - I suspect his hip pain is more radicular in nature especially to the left given that it goes down his thigh to his knee. He may also be compensating with his left leg more. He has failed meloxicam 15mg , will start short steroid course. Counseled patient on potential for adverse effects with medications prescribed today, patient verbalized understanding. Patient is to set up f/u with his ortho physician at [facility]. Otherwise, f/u in 2 weeks if symptoms persist.  Jaynee Eagles, PA-C [facility] [place] [phone] 10/27/2016  3:12 PM

## 2016-11-14 DIAGNOSIS — I10 Essential (primary) hypertension: Secondary | ICD-10-CM | POA: Diagnosis not present

## 2016-11-14 DIAGNOSIS — I358 Other nonrheumatic aortic valve disorders: Secondary | ICD-10-CM | POA: Diagnosis not present

## 2016-11-14 DIAGNOSIS — M48061 Spinal stenosis, lumbar region without neurogenic claudication: Secondary | ICD-10-CM | POA: Diagnosis not present

## 2016-11-14 DIAGNOSIS — E7800 Pure hypercholesterolemia, unspecified: Secondary | ICD-10-CM | POA: Diagnosis not present

## 2016-11-15 ENCOUNTER — Other Ambulatory Visit: Payer: Self-pay | Admitting: Internal Medicine

## 2016-11-15 DIAGNOSIS — I358 Other nonrheumatic aortic valve disorders: Secondary | ICD-10-CM

## 2016-11-20 ENCOUNTER — Other Ambulatory Visit: Payer: Self-pay

## 2016-11-20 ENCOUNTER — Ambulatory Visit (HOSPITAL_COMMUNITY): Payer: Medicare HMO | Attending: Cardiology

## 2016-11-20 DIAGNOSIS — E291 Testicular hypofunction: Secondary | ICD-10-CM | POA: Diagnosis not present

## 2016-11-20 DIAGNOSIS — R011 Cardiac murmur, unspecified: Secondary | ICD-10-CM | POA: Diagnosis present

## 2016-11-20 DIAGNOSIS — K219 Gastro-esophageal reflux disease without esophagitis: Secondary | ICD-10-CM | POA: Diagnosis not present

## 2016-11-20 DIAGNOSIS — E7800 Pure hypercholesterolemia, unspecified: Secondary | ICD-10-CM | POA: Diagnosis not present

## 2016-11-20 DIAGNOSIS — Z7982 Long term (current) use of aspirin: Secondary | ICD-10-CM | POA: Diagnosis not present

## 2016-11-20 DIAGNOSIS — I071 Rheumatic tricuspid insufficiency: Secondary | ICD-10-CM | POA: Insufficient documentation

## 2016-11-20 DIAGNOSIS — Z888 Allergy status to other drugs, medicaments and biological substances status: Secondary | ICD-10-CM | POA: Insufficient documentation

## 2016-11-20 DIAGNOSIS — Z79899 Other long term (current) drug therapy: Secondary | ICD-10-CM | POA: Insufficient documentation

## 2016-11-20 DIAGNOSIS — M48061 Spinal stenosis, lumbar region without neurogenic claudication: Secondary | ICD-10-CM | POA: Insufficient documentation

## 2016-11-20 DIAGNOSIS — I10 Essential (primary) hypertension: Secondary | ICD-10-CM | POA: Insufficient documentation

## 2016-11-20 DIAGNOSIS — Z87891 Personal history of nicotine dependence: Secondary | ICD-10-CM | POA: Insufficient documentation

## 2016-11-20 DIAGNOSIS — I358 Other nonrheumatic aortic valve disorders: Secondary | ICD-10-CM

## 2016-11-20 DIAGNOSIS — M5136 Other intervertebral disc degeneration, lumbar region: Secondary | ICD-10-CM | POA: Insufficient documentation

## 2017-05-15 DIAGNOSIS — Z0000 Encounter for general adult medical examination without abnormal findings: Secondary | ICD-10-CM | POA: Diagnosis not present

## 2017-05-15 DIAGNOSIS — R350 Frequency of micturition: Secondary | ICD-10-CM | POA: Diagnosis not present

## 2017-05-15 DIAGNOSIS — E7800 Pure hypercholesterolemia, unspecified: Secondary | ICD-10-CM | POA: Diagnosis not present

## 2017-05-15 DIAGNOSIS — Z23 Encounter for immunization: Secondary | ICD-10-CM | POA: Diagnosis not present

## 2017-05-15 DIAGNOSIS — I10 Essential (primary) hypertension: Secondary | ICD-10-CM | POA: Diagnosis not present

## 2017-05-15 DIAGNOSIS — Z1389 Encounter for screening for other disorder: Secondary | ICD-10-CM | POA: Diagnosis not present

## 2017-05-15 DIAGNOSIS — Z125 Encounter for screening for malignant neoplasm of prostate: Secondary | ICD-10-CM | POA: Diagnosis not present

## 2017-08-20 DIAGNOSIS — Z791 Long term (current) use of non-steroidal anti-inflammatories (NSAID): Secondary | ICD-10-CM | POA: Diagnosis not present

## 2017-08-20 DIAGNOSIS — Z7722 Contact with and (suspected) exposure to environmental tobacco smoke (acute) (chronic): Secondary | ICD-10-CM | POA: Diagnosis not present

## 2017-08-20 DIAGNOSIS — G8929 Other chronic pain: Secondary | ICD-10-CM | POA: Diagnosis not present

## 2017-08-20 DIAGNOSIS — N400 Enlarged prostate without lower urinary tract symptoms: Secondary | ICD-10-CM | POA: Diagnosis not present

## 2017-08-20 DIAGNOSIS — Z823 Family history of stroke: Secondary | ICD-10-CM | POA: Diagnosis not present

## 2017-08-20 DIAGNOSIS — I10 Essential (primary) hypertension: Secondary | ICD-10-CM | POA: Diagnosis not present

## 2017-08-20 DIAGNOSIS — Z820 Family history of epilepsy and other diseases of the nervous system: Secondary | ICD-10-CM | POA: Diagnosis not present

## 2017-08-20 DIAGNOSIS — N529 Male erectile dysfunction, unspecified: Secondary | ICD-10-CM | POA: Diagnosis not present

## 2017-08-20 DIAGNOSIS — E785 Hyperlipidemia, unspecified: Secondary | ICD-10-CM | POA: Diagnosis not present

## 2017-08-20 DIAGNOSIS — Z7982 Long term (current) use of aspirin: Secondary | ICD-10-CM | POA: Diagnosis not present

## 2017-11-13 DIAGNOSIS — E7800 Pure hypercholesterolemia, unspecified: Secondary | ICD-10-CM | POA: Diagnosis not present

## 2017-11-13 DIAGNOSIS — I10 Essential (primary) hypertension: Secondary | ICD-10-CM | POA: Diagnosis not present

## 2017-11-13 DIAGNOSIS — Z23 Encounter for immunization: Secondary | ICD-10-CM | POA: Diagnosis not present

## 2017-11-13 DIAGNOSIS — L84 Corns and callosities: Secondary | ICD-10-CM | POA: Diagnosis not present

## 2018-06-05 DIAGNOSIS — M47817 Spondylosis without myelopathy or radiculopathy, lumbosacral region: Secondary | ICD-10-CM | POA: Diagnosis not present

## 2018-06-05 DIAGNOSIS — M545 Low back pain: Secondary | ICD-10-CM | POA: Diagnosis not present

## 2018-06-10 DIAGNOSIS — M47817 Spondylosis without myelopathy or radiculopathy, lumbosacral region: Secondary | ICD-10-CM | POA: Diagnosis not present

## 2018-06-10 DIAGNOSIS — M545 Low back pain: Secondary | ICD-10-CM | POA: Diagnosis not present

## 2018-06-11 DIAGNOSIS — M5116 Intervertebral disc disorders with radiculopathy, lumbar region: Secondary | ICD-10-CM | POA: Diagnosis not present

## 2018-06-11 DIAGNOSIS — M48061 Spinal stenosis, lumbar region without neurogenic claudication: Secondary | ICD-10-CM | POA: Diagnosis not present

## 2018-06-11 DIAGNOSIS — M47817 Spondylosis without myelopathy or radiculopathy, lumbosacral region: Secondary | ICD-10-CM | POA: Diagnosis not present

## 2018-06-13 DIAGNOSIS — G4700 Insomnia, unspecified: Secondary | ICD-10-CM | POA: Diagnosis not present

## 2018-06-13 DIAGNOSIS — I10 Essential (primary) hypertension: Secondary | ICD-10-CM | POA: Diagnosis not present

## 2018-06-13 DIAGNOSIS — Z0001 Encounter for general adult medical examination with abnormal findings: Secondary | ICD-10-CM | POA: Diagnosis not present

## 2018-06-13 DIAGNOSIS — M5127 Other intervertebral disc displacement, lumbosacral region: Secondary | ICD-10-CM | POA: Diagnosis not present

## 2018-06-13 DIAGNOSIS — E7800 Pure hypercholesterolemia, unspecified: Secondary | ICD-10-CM | POA: Diagnosis not present

## 2018-06-14 ENCOUNTER — Inpatient Hospital Stay (HOSPITAL_COMMUNITY): Payer: Medicare HMO

## 2018-06-14 ENCOUNTER — Encounter (HOSPITAL_COMMUNITY): Payer: Self-pay

## 2018-06-14 ENCOUNTER — Inpatient Hospital Stay (HOSPITAL_COMMUNITY)
Admission: AD | Admit: 2018-06-14 | Discharge: 2018-06-17 | DRG: 460 | Disposition: A | Discharge status: Rehabilitation Facility | Payer: Medicare HMO | Attending: Orthopedic Surgery | Admitting: Orthopedic Surgery

## 2018-06-14 DIAGNOSIS — M5126 Other intervertebral disc displacement, lumbar region: Secondary | ICD-10-CM | POA: Diagnosis present

## 2018-06-14 DIAGNOSIS — M5116 Intervertebral disc disorders with radiculopathy, lumbar region: Secondary | ICD-10-CM | POA: Diagnosis not present

## 2018-06-14 DIAGNOSIS — M4326 Fusion of spine, lumbar region: Secondary | ICD-10-CM | POA: Diagnosis not present

## 2018-06-14 DIAGNOSIS — Z1159 Encounter for screening for other viral diseases: Secondary | ICD-10-CM | POA: Diagnosis not present

## 2018-06-14 DIAGNOSIS — Z833 Family history of diabetes mellitus: Secondary | ICD-10-CM | POA: Diagnosis not present

## 2018-06-14 DIAGNOSIS — Z01818 Encounter for other preprocedural examination: Secondary | ICD-10-CM

## 2018-06-14 DIAGNOSIS — Z993 Dependence on wheelchair: Secondary | ICD-10-CM

## 2018-06-14 DIAGNOSIS — N400 Enlarged prostate without lower urinary tract symptoms: Secondary | ICD-10-CM | POA: Diagnosis not present

## 2018-06-14 DIAGNOSIS — M545 Low back pain: Secondary | ICD-10-CM | POA: Diagnosis not present

## 2018-06-14 DIAGNOSIS — E785 Hyperlipidemia, unspecified: Secondary | ICD-10-CM | POA: Diagnosis present

## 2018-06-14 DIAGNOSIS — Z743 Need for continuous supervision: Secondary | ICD-10-CM | POA: Diagnosis not present

## 2018-06-14 DIAGNOSIS — Z79891 Long term (current) use of opiate analgesic: Secondary | ICD-10-CM | POA: Diagnosis not present

## 2018-06-14 DIAGNOSIS — M48061 Spinal stenosis, lumbar region without neurogenic claudication: Secondary | ICD-10-CM | POA: Diagnosis not present

## 2018-06-14 DIAGNOSIS — I10 Essential (primary) hypertension: Secondary | ICD-10-CM | POA: Diagnosis not present

## 2018-06-14 DIAGNOSIS — M5489 Other dorsalgia: Secondary | ICD-10-CM | POA: Diagnosis not present

## 2018-06-14 DIAGNOSIS — K5900 Constipation, unspecified: Secondary | ICD-10-CM | POA: Diagnosis not present

## 2018-06-14 DIAGNOSIS — Z981 Arthrodesis status: Secondary | ICD-10-CM

## 2018-06-14 DIAGNOSIS — R279 Unspecified lack of coordination: Secondary | ICD-10-CM | POA: Diagnosis not present

## 2018-06-14 DIAGNOSIS — M2578 Osteophyte, vertebrae: Secondary | ICD-10-CM | POA: Diagnosis not present

## 2018-06-14 DIAGNOSIS — D62 Acute posthemorrhagic anemia: Secondary | ICD-10-CM | POA: Diagnosis not present

## 2018-06-14 DIAGNOSIS — Z87891 Personal history of nicotine dependence: Secondary | ICD-10-CM

## 2018-06-14 DIAGNOSIS — R52 Pain, unspecified: Secondary | ICD-10-CM | POA: Diagnosis not present

## 2018-06-14 DIAGNOSIS — M5136 Other intervertebral disc degeneration, lumbar region: Secondary | ICD-10-CM | POA: Diagnosis not present

## 2018-06-14 DIAGNOSIS — Z79899 Other long term (current) drug therapy: Secondary | ICD-10-CM

## 2018-06-14 DIAGNOSIS — E119 Type 2 diabetes mellitus without complications: Secondary | ICD-10-CM | POA: Diagnosis present

## 2018-06-14 DIAGNOSIS — M5137 Other intervertebral disc degeneration, lumbosacral region: Secondary | ICD-10-CM

## 2018-06-14 LAB — BASIC METABOLIC PANEL
Anion gap: 10 (ref 5–15)
BUN: 14 mg/dL (ref 8–23)
CO2: 23 mmol/L (ref 22–32)
Calcium: 9.3 mg/dL (ref 8.9–10.3)
Chloride: 103 mmol/L (ref 98–111)
Creatinine, Ser: 0.86 mg/dL (ref 0.61–1.24)
GFR calc Af Amer: >60 mL/min (ref >60)
GFR calc non Af Amer: >60 mL/min (ref >60)
Glucose, Bld: 113 mg/dL — ABNORMAL HIGH (ref 70–99)
Potassium: 4.5 mmol/L (ref 3.5–5.1)
Sodium: 136 mmol/L (ref 135–145)

## 2018-06-14 LAB — CBC
HCT: 42.2 % (ref 39.0–52.0)
Hemoglobin: 14.1 g/dL (ref 13.0–17.0)
MCH: 28.8 pg (ref 26.0–34.0)
MCHC: 33.4 g/dL (ref 30.0–36.0)
MCV: 86.1 fL (ref 80.0–100.0)
Platelets: 255 10*3/uL (ref 150–400)
RBC: 4.9 MIL/uL (ref 4.22–5.81)
RDW: 14.1 % (ref 11.5–15.5)
WBC: 5.7 10*3/uL (ref 4.0–10.5)
nRBC: 0 % (ref 0.0–0.2)

## 2018-06-14 LAB — SARS CORONAVIRUS 2 BY RT PCR (HOSPITAL ORDER, PERFORMED IN ~~LOC~~ HOSPITAL LAB): SARS Coronavirus 2: NEGATIVE

## 2018-06-14 LAB — URINALYSIS, ROUTINE W REFLEX MICROSCOPIC
Bilirubin Urine: NEGATIVE
Glucose, UA: NEGATIVE mg/dL
Hgb urine dipstick: NEGATIVE
Ketones, ur: NEGATIVE mg/dL
Leukocytes,Ua: NEGATIVE
Nitrite: NEGATIVE
Protein, ur: NEGATIVE mg/dL
Specific Gravity, Urine: 1.013 (ref 1.005–1.030)
pH: 7 (ref 5.0–8.0)

## 2018-06-14 LAB — SURGICAL PCR SCREEN
MRSA, PCR: NEGATIVE
Staphylococcus aureus: NEGATIVE

## 2018-06-14 LAB — APTT: aPTT: 30 seconds (ref 24–36)

## 2018-06-14 LAB — PROTIME-INR
INR: 1.1 (ref 0.8–1.2)
Prothrombin Time: 14.1 seconds (ref 11.4–15.2)

## 2018-06-14 MED ORDER — ACETAMINOPHEN 325 MG PO TABS: 650.0000 mg | ORAL_TABLET | Freq: Four times a day (QID) | ORAL | Status: DC | PRN

## 2018-06-14 MED ORDER — ACETAMINOPHEN 650 MG RE SUPP: 650.0000 mg | Freq: Four times a day (QID) | RECTAL | Status: DC | PRN

## 2018-06-14 MED ORDER — MUPIROCIN 2 % EX OINT
1.0000 "application " | TOPICAL_OINTMENT | Freq: Two times a day (BID) | CUTANEOUS | Status: DC
  Administered 2018-06-14: 1 via NASAL
  Filled 2018-06-14: qty 22

## 2018-06-14 MED ORDER — MAGNESIUM HYDROXIDE 400 MG/5ML PO SUSP: 30.0000 mL | Freq: Every day | ORAL | Status: DC | PRN

## 2018-06-14 MED ORDER — HYDROCODONE-ACETAMINOPHEN 5-325 MG PO TABS
1.0000 | ORAL_TABLET | ORAL | Status: DC | PRN
  Administered 2018-06-14 – 2018-06-15 (×4): 2 via ORAL
  Filled 2018-06-14 (×4): qty 2

## 2018-06-14 MED ORDER — GADOBUTROL 1 MMOL/ML IV SOLN
9.0000 mL | Freq: Once | INTRAVENOUS | Status: AC | PRN
  Administered 2018-06-14: 9 mL via INTRAVENOUS

## 2018-06-14 NOTE — Consult Note (Signed)
[facility] Medical Consultation  Joseph Moyer KPT:[id number] DOB: May 27, 1940 DOA: 06/14/2018 PCP: [name], MD   Requesting physician: Dr. Rolena Infante Date of consultation: 06/14/2018 Reason for consultation: pre-op evaluation, medical problems  HPI:  This is a pleasant 78 year old male with history of hypertension, hyperlipidemia, otherwise healthy presents to the hospital as an elective admission on the orthopedic surgery service.  Patient started having severe back pain several weeks ago while lifting something heavy, this is progressed and developed right leg numbness and weakness.  He was eventually seen by Dr. Rolena Infante as an outpatient and initially patient did not want surgery, but his symptoms progressed and eventually patient now agreeing to surgery.  He currently is feeling well other than back pain and lower extremity weakness.  He denies any chest pains, denies any shortness of breath.  Prior to this happening he was relatively active walking 1 to 2 miles several times a week without any exertional dyspnea or chest discomfort.  He has a history of tobacco use but quit more than 10 years ago.  He rarely drinks alcohol.  He denies any recent fever or chills, no abdominal pain, nausea vomiting or diarrhea.  With a coronavirus he has been staying mostly indoors and has wore a mask at all times when he goes out.  He has family history of diabetes mellitus in his relatives but no known family history of heart disease.  Patient was never diagnosed with any heart issues himself.  His blood pressure medications were recently changed from losartan to what looks like amlodipine.  He denies any prior history of lower extremity swelling or fluid overload.  Review of Systems:  As per HPI, otherwise 10 point review of systems negative   Impression/Recommendations Active Problems:   Herniated lumbar intervertebral disc    1. Herniated lumbar intervertebral disc-patient's Lyndel Safe  perioperative risk is low at 0.2%.  Prior to his back issues he was fairly active without any dyspnea or symptoms concerning for angina.  Okay to proceed with surgery without any additional testing required at this point.  EKG shows sinus rhythm without significant findings. 2. Hypertension -hold blood pressure medications perioperative, resume postop as indicated 3. ?  Diabetes mellitus-patient has family history of diabetes mellitus and he had an A1c of 6.8 back in 2016 which actually puts him into the diabetes range.  Patient unaware.  We will repeat an A1c 4. He will also get coronavirus screening as per protocol for hospitalized patients   We will followup again tomorrow. Please contact me if I can be of assistance in the meanwhile. Thank you for this consultation.  Scheduled Meds:  mupirocin ointment  1 application Nasal BID   Continuous Infusions: PRN Meds:.acetaminophen **OR** acetaminophen, HYDROcodone-acetaminophen, magnesium hydroxide   Past Medical History:  Diagnosis Date   Allergy    Arthritis    Hyperlipidemia    Hypertension    Past Surgical History:  Procedure Laterality Date   CIRCUMCISION     COLONOSCOPY WITH PROPOFOL N/A 05/10/2015   Procedure: COLONOSCOPY WITH PROPOFOL;  Surgeon: Garlan Fair, MD;  Location: [facility];  Service: Endoscopy;  Laterality: N/A;   HEMORRHOID SURGERY     Social History:  reports that he quit smoking about 26 years ago. He has never used smokeless tobacco. He reports current alcohol use. He reports that he does not use drugs.  No Known Allergies Family History  Problem Relation Age of Onset   Diabetes Mother    Diabetes Sister  Diabetes Brother     Prior to Admission medications   Medication Sig Start Date End Date Taking? Authorizing Provider  aspirin 81 MG tablet Take 81 mg by mouth every morning.    Yes [provider]  predniSONE (DELTASONE) 20 MG tablet Take 2 tablets daily with breakfast.  10/27/16  Yes Jaynee Eagles, PA-C  simvastatin (ZOCOR) 20 MG tablet Take 20 mg by mouth every evening.    Yes [provider]  losartan (COZAAR) 25 MG tablet Take 25 mg by mouth every morning.  06/18/14   [provider]   Physical Exam: Blood pressure (!) 158/90, pulse 95, temperature 98.1 F (36.7 C), temperature source Oral, resp. rate 18, SpO2 100 %. Vitals:   06/14/18 1300  BP: (!) 158/90  Pulse: 95  Resp: 18  Temp: 98.1 F (36.7 C)  SpO2: 100%     General: No distress, laying in bed  Eyes: No scleral icterus, lids and conjunctive are normal  ENT: NCAT  Neck: Supple, no JVD, no masses  Cardiovascular: Regular rate and rhythm, no murmurs appreciated.  No peripheral edema.  Respiratory: Lungs are clear to auscultation without crackles or wheezing.  Moves air well.  Normal respiratory effort.  Abdomen: Soft, nontender, nondistended, bowel sounds positive  Skin: No rashes seen  Musculoskeletal: Normal muscle mass  Psychiatric: Normal mood and affect  Neurologic: Cranial nerves II through XII grossly intact.  Strength equal 5/5 in upper extremities, antalgic exam due to severe pain on the lower extremity but appears to be slightly weaker on the right.  There is decreased sensation in the right lower extremity as well.  Labs on Admission:  Basic Metabolic Panel: Recent Labs  Lab 06/14/18 1326  NA 136  K 4.5  CL 103  CO2 23  GLUCOSE 113*  BUN 14  CREATININE 0.86  CALCIUM 9.3   Liver Function Tests: No results for input(s): AST, ALT, ALKPHOS, BILITOT, PROT, ALBUMIN in the last 168 hours. No results for input(s): LIPASE, AMYLASE in the last 168 hours. No results for input(s): AMMONIA in the last 168 hours. CBC: Recent Labs  Lab 06/14/18 1326  WBC 5.7  HGB 14.1  HCT 42.2  MCV 86.1  PLT 255   Cardiac Enzymes: No results for input(s): CKTOTAL, CKMB, CKMBINDEX, TROPONINI in the last 168 hours. BNP: Invalid input(s): POCBNP CBG: No results  for input(s): GLUCAP in the last 168 hours.  Radiological Exams on Admission: X-ray Chest Pa And Lateral  Result Date: 06/14/2018 CLINICAL DATA:  Preoperative examination. EXAM: CHEST - 2 VIEW COMPARISON:  Chest x-ray dated July 30, 2014. FINDINGS: The heart size and mediastinal contours are within normal limits. Both lungs are clear. The visualized skeletal structures are unremarkable. IMPRESSION: No active cardiopulmonary disease. Electronically Signed   By: Titus Dubin M.D.   On: 06/14/2018 14:41   Mr Lumbar Spine W Wo Contrast  Result Date: 06/14/2018 CLINICAL DATA:  Acute low back pain with bilateral leg pain and weakness since lifting a heavy bag in the yard 2 weeks ago. EXAM: MRI LUMBAR SPINE WITHOUT AND WITH CONTRAST TECHNIQUE: Multiplanar and multiecho pulse sequences of the lumbar spine were obtained without and with intravenous contrast. CONTRAST:  9 mL Gadavist intravenous contrast. COMPARISON:  MR lumbar spine dated July 24, 2014. FINDINGS: Segmentation:  Standard. Alignment:  Unchanged trace retrolisthesis at L5-S1. Vertebrae: No fracture, evidence of discitis, or bone lesion. Unchanged chronic degenerative fatty marrow endplate changes at U2-P5 and L5-S1. Conus medullaris and cauda equina: Conus  extends to the L1-L2 level. No intradural enhancement. Paraspinal and other soft tissues: Negative. Disc levels: T11-T12: Negative. T12-L1: Negative disc. Mild bilateral facet arthropathy. No stenosis. L1-L2: New large central and left-sided disc extrusion migrating both superiorly and inferiorly, measuring 2.6 cm in craniocaudal dimension. This results in new severe spinal canal stenosis with posterior displacement of the conus. A small amount of disc material extends into the left neural foramen (series 6, image 9). Unchanged mild bilateral facet arthropathy. L2-L3: Central and left paracentral disc protrusion. Moderate to severe bilateral facet arthropathy. Progressive moderate to severe  central spinal canal and lateral recess stenosis. Unchanged mild bilateral neuroforaminal stenosis. L3-L4: Unchanged small circumferential disc osteophyte complex. Unchanged moderate bilateral facet arthropathy. Unchanged borderline mild spinal canal stenosis. Unchanged mild bilateral neuroforaminal stenosis. L4-L5: Unchanged small circumferential disc osteophyte complex and mild bilateral facet arthropathy. Unchanged mild left lateral recess stenosis. Unchanged moderate bilateral neuroforaminal stenosis. No spinal canal stenosis. L5-S1: Unchanged circumferential disc osteophyte complex and mild bilateral facet arthropathy. Unchanged moderate to severe left and moderate right neuroforaminal stenosis. No spinal canal stenosis. IMPRESSION: 1. 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 2. Progressive moderate to severe spinal canal and lateral recess stenosis at L2-L3. 3. Remaining multilevel lumbar spondylosis as described above is similar to prior study, including moderate to severe left neuroforaminal stenosis at L5-S1. Electronically Signed   By: Titus Dubin M.D.   On: 06/14/2018 16:54    EKG: Independently reviewed. Sinus rhythm   Time spent: 50 minutes  [facility] Pager [phone]  If 7PM-7AM, please contact night-coverage [email or web address] Password [email or web address] 06/14/2018, 6:01 PM

## 2018-06-14 NOTE — Progress Notes (Signed)
Pt arrived to 3W18. Alert and oriented x4. Complains of 10/10 pain in his lower back which he states started 2 weeks ago when he was working outside in his yard and lifted a heavy bag of fertilizer. Pt states he had breakfast around 8am, but has not had anything to eat or drink since.

## 2018-06-14 NOTE — H&P (Signed)
History: Joseph Moyer is a very pleasant 78 year old gentleman who started having severe back pain several weeks ago after lifting fertilizer.  The patient then developed severe radicular right leg pain.  He was ultimately referred to me by my partner because of progressive neurological deficits.  At the time of his evaluation earlier this week the patient was nonambulatory x1 week because of severe pain as well as global weakness in the right lower extremity.  He was able to move the left lower extremity but he could not stand and support his own weight.  At the time of his evaluation his imaging studies demonstrated a very large disc herniation at L1-2 causing severe canal stenosis.  At that time I had recommended surgery but the patient did not want to proceed.  The patient contacted me earlier this morning educating that his symptoms of gotten progressively worse and wanted to move forward with surgery.  As a result I admitted the patient to the hospital and we will move forward with surgical intervention tomorrow morning.  Allergies NKDA  Medications acyclovir 400 mg tablet amLODIPine 5 mg tablet gabapentin 300 mg capsule HYDROcodone 5 mg-acetaminophen 325 mg tablet meloxicam 15 mg tablet methocarbamoL 500 mg tablet simvastatin 20 mg tablet tamsulosin 0.4 mg capsule           Problems Spinal stenosis of lumbar region - Onset: 06/11/2018 Lumbar disc prolapse with radiculopathy - Onset: 06/11/2018 Low back pain - Onset: 06/05/2018 Lumbosacral spondylosis without myelopathy - Onset: 06/05/2018  Reviewed Surgical History None  Past Medical History Joint Pain: Y  Physical Exam Clinical exam: Joseph Moyer is a pleasant individual, who appears younger than their stated age. He Is alert and orientated 3. No shortness of breath, chest pain. Abdomen is soft and non-tender, negative loss of bowel and bladder control, no rebound tenderness. Negative: skin lesions abrasions contusions  Lungs: Clear to  auscultation bilaterally  Cardiac: Regular rate and rhythm no rubs gallops or murmurs.  Peripheral pulses: 1+ dorsalis pedis/posterior tibialis pulses bilaterally. Compartment soft and nontender.  Gait pattern: Patient is unable to ambulate due to significant pain and global weakness in the right lower extremity  Assistive devices: Currently in a wheelchair  Neuro: 1 out of 5 right flexor, quad, hamstring, EHL, tibialis anterior, gastrocnemius strength. 4+ out of 5 left lower extremity strength. Positive loss in sensation to light touch diffusely in the right lower extremity. Patient denies any saddle dysesthesias, or loss in bowel and bladder control. Negative Babinski test. Absent reflexes at the knee and Achilles bilaterally. No clonus.  Musculoskeletal: Significant back pain with attempted range of motion. No significant hip, knee, ankle pain with passive range of motion.  X-rays of the lumbar spine demonstrate loss of lumbar lordosis. Significant degenerative disc disease L4-5 and L5-S1.  MRI of the lumbar spine dated 06/10/18: Large anterior epidural mass consistent with disc herniation extending from the midportion of the L1 vertebral body down to the midportion of the L2 vertebral body. Significant loss in central canal volume. Questionable intrathecal involvement of the disc herniation. L2-3: Severe left lateral recess stenosis and moderate to severe by foraminal stenosis. Moderate to severe by foraminal stenosis L3-4 but there is no significant displacement or compression of the nerve. Significant degenerative disc disease L4-5 and L5-S1 with mild to moderate stenosis.  MRI of the lumbar spine dated 06/14/2018: Similar large anterior epidural mass consistent with disc herniation at L1-2.  Producing severe canal stenosis.  Patient also has significant left lateral recess gnosis at L2-3  with compression of the traversing L3 nerve root.  Moderate to significant degenerative changes L3-S1  including significant degenerative disc disease, and moderate degrees of lateral recess and foraminal stenosis.   Assessment & Plan Diagnosis: Joseph Moyer is a very pleasant 78 year old gentleman who first noted significant back pain several weeks ago and then last week noted significant weakness in his right lower extremity and is essentially been wheelchair bound unable to ambulate because of severe right leg weakness. Imaging studies demonstrate a very large disc herniation at L1 to causing severe canal stenosis. Fortunately, he does not demonstrate signs of a cauda equina syndrome or conus injury. He is having progressive neurological deficits.  At this point time I had a long conversation with the patient and his wife about surgical decompression. Given the location more than likely I would need to do a wide central decompression in order to completely decompress the thecal sac and adequately remove this very large disc fragment. Given the extent of the bony decompression that would be required I would recommend a  posterior pedicle screw fusion to prevent iatrogenic instability.  At the time of his initial evaluation earlier this week the patient indicated he did not want to proceed with surgery.  He was quite concerned and preferred to proceed with an injection.  At that time  I informed both the patient and his wife that his neurological deficits could be permanent or even progress without surgery. He stated that he still wanted to at least try an injection before moving forward with surgery.  The patient contacted my office early this morning indicating that the pain and loss in function was too severe and he would like to proceed with surgery.  According to the patient and his wife he also felt as though his left leg was now becoming increasingly weak.  Although was difficult to know whether or not the pain caused him to lose function in the left side or if he was having progressive neurological  deficits.  As a result of the increased pain and loss in function I elected to admit the patient and move forward with surgical intervention.  Because of the described progressive symptoms I did want to get an updated MRI.  The new MRI done this evening continues to demonstrate multilevel degenerative pathology in the lower lumbar spine as well as the large significant L1-2 disc herniation with severe canal stenosis.  He also has significant left lateral recess stenosis at L2-3.  These findings are similar to his previous MRI which I reviewed earlier this week.  At this point while he does have degenerative issues in the lower L4-5 and L5-S1 levels I do not think this is his primary problem.  He has global lower extremity weakness right side worse than the left.  Although it is difficult to determine whether or not the left leg weakness that he now demonstrates represents true neurological deficit versus pain inhibition I am concerned of progression.  At this point we will plan on moving forward with an L1/2 to decompression and excision of the disc herniation.  Again given the large amount of disc material I believe moving forward with a wide decompression versus Gill decompression at that level is warranted.  This will keep her create adequate room to safely excise the large disc fragment with minimal additional mobilization of the thecal sac.  In addition since he is having progressive left-sided symptoms I would also do a left L2 hemilaminotomy to address the lateral recess stenosis  at that level.  Because I would be sacrificing both facet joints I would then supplement this with a posterior lateral arthrodesis and instrumented fusion.  I have gone over the surgical procedure with the patient and I have spoken with his wife on the phone earlier this morning.  The risks of surgery include but are not limited to: Infection, bleeding, death, stroke, paralysis, ongoing or worse pain, ongoing or worse  neurological deficits, permanent loss in the ability to ambulate, permanent or temporary loss in bowel and bladder control (incontinence), nonunion, hardware failure, nonunion, need for additional surgery, CSF leak.  The patient is expressed an understanding of both the risks as well as the procedure.  He is also aware of the goal which is to reduce his severe radicular leg pain, improve his motor strength, and hopefully allow him to restore his ability to walk.  At this point we will plan on moving forward with surgery tomorrow morning.  Patient will remain n.p.o. after midnight tonight.  I have spoken with Dr. Delfina Redwood his primary care physician who indicated that the patient was medically cleared for surgery.  I will request a formal hospitalist evaluation just to confirm that there are no medical indications for delaying his surgery.

## 2018-06-15 ENCOUNTER — Inpatient Hospital Stay (HOSPITAL_COMMUNITY): Payer: Medicare HMO | Admitting: Certified Registered Nurse Anesthetist

## 2018-06-15 ENCOUNTER — Inpatient Hospital Stay (HOSPITAL_COMMUNITY)
Admission: AD | Disposition: A | Discharge status: Rehabilitation Facility | Payer: Self-pay | Source: Home / Self Care | Attending: Orthopedic Surgery

## 2018-06-15 ENCOUNTER — Inpatient Hospital Stay (HOSPITAL_COMMUNITY): Payer: Medicare HMO

## 2018-06-15 DIAGNOSIS — Z981 Arthrodesis status: Secondary | ICD-10-CM

## 2018-06-15 HISTORY — PX: DECOMPRESSIVE LUMBAR LAMINECTOMY LEVEL 1: SHX5791

## 2018-06-15 LAB — TYPE AND SCREEN
ABO/RH(D): A NEG
Antibody Screen: NEGATIVE

## 2018-06-15 LAB — ABO/RH: ABO/RH(D): A NEG

## 2018-06-15 LAB — HEMOGLOBIN A1C
Hgb A1c MFr Bld: 6.5 % — ABNORMAL HIGH (ref 4.8–5.6)
Mean Plasma Glucose: 139.85 mg/dL

## 2018-06-15 SURGERY — POSTERIOR LUMBAR FUSION 1 LEVEL
Anesthesia: General | Site: Back

## 2018-06-15 MED ORDER — FENTANYL CITRATE (PF) 250 MCG/5ML IJ SOLN
INTRAMUSCULAR | Status: AC
  Filled 2018-06-15: qty 5

## 2018-06-15 MED ORDER — ALBUMIN HUMAN 5 % IV SOLN
INTRAVENOUS | Status: DC | PRN
  Administered 2018-06-15: 10:00:00 via INTRAVENOUS

## 2018-06-15 MED ORDER — METHOCARBAMOL 500 MG PO TABS
ORAL_TABLET | ORAL | Status: AC
  Filled 2018-06-15: qty 1

## 2018-06-15 MED ORDER — PROPOFOL 10 MG/ML IV BOLUS
INTRAVENOUS | Status: DC | PRN
  Administered 2018-06-15: 200 mg via INTRAVENOUS

## 2018-06-15 MED ORDER — TRANEXAMIC ACID-NACL 1000-0.7 MG/100ML-% IV SOLN
INTRAVENOUS | Status: AC
  Filled 2018-06-15: qty 100

## 2018-06-15 MED ORDER — BUPIVACAINE-EPINEPHRINE 0.25% -1:200000 IJ SOLN
INTRAMUSCULAR | Status: DC | PRN
  Administered 2018-06-15: 10 mL

## 2018-06-15 MED ORDER — ROCURONIUM BROMIDE 10 MG/ML (PF) SYRINGE
PREFILLED_SYRINGE | INTRAVENOUS | Status: AC
  Filled 2018-06-15: qty 10

## 2018-06-15 MED ORDER — PHENOL 1.4 % MT LIQD: 1.0000 | OROMUCOSAL | Status: DC | PRN

## 2018-06-15 MED ORDER — LACTATED RINGERS IV SOLN
INTRAVENOUS | Status: DC | PRN
  Administered 2018-06-15 (×2): via INTRAVENOUS

## 2018-06-15 MED ORDER — FLEET ENEMA 7-19 GM/118ML RE ENEM: 1.0000 | ENEMA | Freq: Once | RECTAL | Status: DC | PRN

## 2018-06-15 MED ORDER — CEFAZOLIN SODIUM 1 G IJ SOLR
INTRAMUSCULAR | Status: AC
  Filled 2018-06-15: qty 20

## 2018-06-15 MED ORDER — PHENYLEPHRINE HCL (PRESSORS) 10 MG/ML IV SOLN
INTRAVENOUS | Status: DC | PRN
  Administered 2018-06-15: 80 ug via INTRAVENOUS

## 2018-06-15 MED ORDER — MORPHINE SULFATE (PF) 4 MG/ML IV SOLN
INTRAVENOUS | Status: AC
  Filled 2018-06-15: qty 2

## 2018-06-15 MED ORDER — OXYCODONE HCL 5 MG PO TABS
ORAL_TABLET | ORAL | Status: AC
  Filled 2018-06-15: qty 1

## 2018-06-15 MED ORDER — ONDANSETRON HCL 4 MG/2ML IJ SOLN: 4.0000 mg | Freq: Once | INTRAMUSCULAR | Status: DC | PRN

## 2018-06-15 MED ORDER — DEXAMETHASONE SODIUM PHOSPHATE 10 MG/ML IJ SOLN
INTRAMUSCULAR | Status: AC
  Filled 2018-06-15: qty 1

## 2018-06-15 MED ORDER — SUCCINYLCHOLINE CHLORIDE 200 MG/10ML IV SOSY
PREFILLED_SYRINGE | INTRAVENOUS | Status: AC
  Filled 2018-06-15: qty 10

## 2018-06-15 MED ORDER — LABETALOL HCL 5 MG/ML IV SOLN
10.0000 mg | Freq: Once | INTRAVENOUS | Status: AC
  Administered 2018-06-15: 10 mg via INTRAVENOUS

## 2018-06-15 MED ORDER — ONDANSETRON HCL 4 MG PO TABS: 4.0000 mg | ORAL_TABLET | Freq: Four times a day (QID) | ORAL | Status: DC | PRN

## 2018-06-15 MED ORDER — ONDANSETRON HCL 4 MG/2ML IJ SOLN
INTRAMUSCULAR | Status: AC
  Filled 2018-06-15: qty 2

## 2018-06-15 MED ORDER — SODIUM CHLORIDE 0.9 % IV SOLN: 250.0000 mL | INTRAVENOUS | Status: DC

## 2018-06-15 MED ORDER — THROMBIN (RECOMBINANT) 20000 UNITS EX SOLR
CUTANEOUS | Status: AC
  Filled 2018-06-15: qty 20000

## 2018-06-15 MED ORDER — DEXAMETHASONE SODIUM PHOSPHATE 10 MG/ML IJ SOLN
INTRAMUSCULAR | Status: DC | PRN
  Administered 2018-06-15: 5 mg via INTRAVENOUS

## 2018-06-15 MED ORDER — GABAPENTIN 300 MG PO CAPS
300.0000 mg | ORAL_CAPSULE | Freq: Three times a day (TID) | ORAL | Status: DC
  Administered 2018-06-15 – 2018-06-17 (×7): 300 mg via ORAL
  Filled 2018-06-15 (×7): qty 1

## 2018-06-15 MED ORDER — SODIUM CHLORIDE 0.9% FLUSH
3.0000 mL | INTRAVENOUS | Status: DC | PRN
  Administered 2018-06-15: 3 mL via INTRAVENOUS
  Filled 2018-06-15: qty 3

## 2018-06-15 MED ORDER — SODIUM CHLORIDE (PF) 0.9 % IJ SOLN
INTRAMUSCULAR | Status: AC
  Filled 2018-06-15: qty 10

## 2018-06-15 MED ORDER — LIDOCAINE 2% (20 MG/ML) 5 ML SYRINGE
INTRAMUSCULAR | Status: DC | PRN
  Administered 2018-06-15: 80 mg via INTRAVENOUS

## 2018-06-15 MED ORDER — MENTHOL 3 MG MT LOZG: 1.0000 | LOZENGE | OROMUCOSAL | Status: DC | PRN

## 2018-06-15 MED ORDER — ACETAMINOPHEN 10 MG/ML IV SOLN
INTRAVENOUS | Status: DC | PRN
  Administered 2018-06-15: 1000 mg via INTRAVENOUS

## 2018-06-15 MED ORDER — SURGIFOAM 100 EX MISC
CUTANEOUS | Status: DC | PRN
  Administered 2018-06-15: 1 via TOPICAL

## 2018-06-15 MED ORDER — LABETALOL HCL 5 MG/ML IV SOLN
INTRAVENOUS | Status: AC
  Filled 2018-06-15: qty 4

## 2018-06-15 MED ORDER — LACTATED RINGERS IV SOLN
INTRAVENOUS | Status: DC
  Administered 2018-06-15 (×2): via INTRAVENOUS

## 2018-06-15 MED ORDER — MORPHINE SULFATE 10 MG/ML IJ SOLN
INTRAMUSCULAR | Status: DC | PRN
  Administered 2018-06-15 (×2): 4 mg via INTRAVENOUS

## 2018-06-15 MED ORDER — ACETAMINOPHEN 650 MG RE SUPP: 650.0000 mg | RECTAL | Status: DC | PRN

## 2018-06-15 MED ORDER — LIDOCAINE 2% (20 MG/ML) 5 ML SYRINGE
INTRAMUSCULAR | Status: AC
  Filled 2018-06-15: qty 5

## 2018-06-15 MED ORDER — SUGAMMADEX SODIUM 200 MG/2ML IV SOLN
INTRAVENOUS | Status: DC | PRN
  Administered 2018-06-15: 200 mg via INTRAVENOUS

## 2018-06-15 MED ORDER — ONDANSETRON HCL 4 MG/2ML IJ SOLN: 4.0000 mg | Freq: Four times a day (QID) | INTRAMUSCULAR | Status: DC | PRN

## 2018-06-15 MED ORDER — SODIUM CHLORIDE 0.9 % IV SOLN
INTRAVENOUS | Status: DC | PRN
  Administered 2018-06-15: 08:00:00 50 ug/min via INTRAVENOUS

## 2018-06-15 MED ORDER — ROCURONIUM BROMIDE 10 MG/ML (PF) SYRINGE
PREFILLED_SYRINGE | INTRAVENOUS | Status: DC | PRN
  Administered 2018-06-15: 30 mg via INTRAVENOUS
  Administered 2018-06-15: 50 mg via INTRAVENOUS
  Administered 2018-06-15 (×3): 20 mg via INTRAVENOUS
  Administered 2018-06-15: 50 mg via INTRAVENOUS

## 2018-06-15 MED ORDER — ONDANSETRON HCL 4 MG/2ML IJ SOLN
INTRAMUSCULAR | Status: DC | PRN
  Administered 2018-06-15: 4 mg via INTRAVENOUS

## 2018-06-15 MED ORDER — METHOCARBAMOL 500 MG PO TABS
500.0000 mg | ORAL_TABLET | Freq: Four times a day (QID) | ORAL | Status: DC | PRN
  Administered 2018-06-15 – 2018-06-17 (×4): 500 mg via ORAL
  Filled 2018-06-15 (×5): qty 1

## 2018-06-15 MED ORDER — CEFAZOLIN SODIUM-DEXTROSE 1-4 GM/50ML-% IV SOLN
1.0000 g | Freq: Three times a day (TID) | INTRAVENOUS | Status: DC
  Filled 2018-06-15 (×2): qty 50

## 2018-06-15 MED ORDER — OXYCODONE HCL 5 MG PO TABS
5.0000 mg | ORAL_TABLET | Freq: Once | ORAL | Status: AC | PRN
  Administered 2018-06-15: 5 mg via ORAL

## 2018-06-15 MED ORDER — HEMOSTATIC AGENTS (NO CHARGE) OPTIME
TOPICAL | Status: DC | PRN
  Administered 2018-06-15: 1 via TOPICAL
  Administered 2018-06-15: 5 via TOPICAL

## 2018-06-15 MED ORDER — SODIUM CHLORIDE 0.9 % IV SOLN
0.1000 ug/kg/min | INTRAVENOUS | Status: DC
  Filled 2018-06-15 (×6): qty 5000

## 2018-06-15 MED ORDER — ARTIFICIAL TEARS OPHTHALMIC OINT
TOPICAL_OINTMENT | OPHTHALMIC | Status: AC
  Filled 2018-06-15: qty 3.5

## 2018-06-15 MED ORDER — GLYCOPYRROLATE PF 0.2 MG/ML IJ SOSY
PREFILLED_SYRINGE | INTRAMUSCULAR | Status: AC
  Filled 2018-06-15: qty 1

## 2018-06-15 MED ORDER — CEFAZOLIN SODIUM-DEXTROSE 2-3 GM-%(50ML) IV SOLR
INTRAVENOUS | Status: DC | PRN
  Administered 2018-06-15 (×2): 2 g via INTRAVENOUS

## 2018-06-15 MED ORDER — CEFAZOLIN SODIUM-DEXTROSE 1-4 GM/50ML-% IV SOLN
1.0000 g | Freq: Three times a day (TID) | INTRAVENOUS | Status: AC
  Administered 2018-06-15 – 2018-06-16 (×2): 1 g via INTRAVENOUS
  Filled 2018-06-15 (×2): qty 50

## 2018-06-15 MED ORDER — ACETAMINOPHEN 325 MG PO TABS: 650.0000 mg | ORAL_TABLET | ORAL | Status: DC | PRN

## 2018-06-15 MED ORDER — PROPOFOL 10 MG/ML IV BOLUS
INTRAVENOUS | Status: AC
  Filled 2018-06-15: qty 20

## 2018-06-15 MED ORDER — OXYCODONE HCL 5 MG PO TABS
10.0000 mg | ORAL_TABLET | ORAL | Status: DC | PRN
  Administered 2018-06-15 – 2018-06-17 (×11): 10 mg via ORAL
  Filled 2018-06-15 (×11): qty 2

## 2018-06-15 MED ORDER — METHOCARBAMOL 1000 MG/10ML IJ SOLN
500.0000 mg | Freq: Four times a day (QID) | INTRAVENOUS | Status: DC | PRN
  Filled 2018-06-15: qty 5

## 2018-06-15 MED ORDER — OXYCODONE HCL 5 MG PO TABS: 5.0000 mg | ORAL_TABLET | ORAL | Status: DC | PRN

## 2018-06-15 MED ORDER — GLYCOPYRROLATE PF 0.2 MG/ML IJ SOSY
PREFILLED_SYRINGE | INTRAMUSCULAR | Status: DC | PRN
  Administered 2018-06-15: .2 mg via INTRAVENOUS

## 2018-06-15 MED ORDER — FENTANYL CITRATE (PF) 100 MCG/2ML IJ SOLN
INTRAMUSCULAR | Status: DC | PRN
  Administered 2018-06-15 (×2): 100 ug via INTRAVENOUS
  Administered 2018-06-15: 75 ug via INTRAVENOUS
  Administered 2018-06-15 (×2): 50 ug via INTRAVENOUS
  Administered 2018-06-15: 100 ug via INTRAVENOUS
  Administered 2018-06-15: 25 ug via INTRAVENOUS

## 2018-06-15 MED ORDER — THROMBIN 20000 UNITS EX SOLR
CUTANEOUS | Status: DC | PRN
  Administered 2018-06-15: 09:00:00 via TOPICAL

## 2018-06-15 MED ORDER — FENTANYL CITRATE (PF) 100 MCG/2ML IJ SOLN
INTRAMUSCULAR | Status: AC
  Filled 2018-06-15: qty 2

## 2018-06-15 MED ORDER — OXYCODONE HCL 5 MG/5ML PO SOLN: 5.0000 mg | Freq: Once | ORAL | Status: AC | PRN

## 2018-06-15 MED ORDER — TRANEXAMIC ACID-NACL 1000-0.7 MG/100ML-% IV SOLN
INTRAVENOUS | Status: DC | PRN
  Administered 2018-06-15: 1000 mg via INTRAVENOUS

## 2018-06-15 MED ORDER — SODIUM CHLORIDE 0.9% FLUSH
3.0000 mL | Freq: Two times a day (BID) | INTRAVENOUS | Status: DC
  Administered 2018-06-15 – 2018-06-17 (×4): 3 mL via INTRAVENOUS

## 2018-06-15 MED ORDER — 0.9 % SODIUM CHLORIDE (POUR BTL) OPTIME
TOPICAL | Status: DC | PRN
  Administered 2018-06-15 (×2): 1000 mL

## 2018-06-15 MED ORDER — LACTATED RINGERS IV SOLN
INTRAVENOUS | Status: DC | PRN
  Administered 2018-06-15: 08:00:00 via INTRAVENOUS

## 2018-06-15 MED ORDER — MAGNESIUM HYDROXIDE 400 MG/5ML PO SUSP: 30.0000 mL | Freq: Every day | ORAL | Status: DC | PRN

## 2018-06-15 MED ORDER — FENTANYL CITRATE (PF) 100 MCG/2ML IJ SOLN
25.0000 ug | INTRAMUSCULAR | Status: DC | PRN
  Administered 2018-06-15: 50 ug via INTRAVENOUS

## 2018-06-15 MED ORDER — ACETAMINOPHEN 10 MG/ML IV SOLN
INTRAVENOUS | Status: AC
  Filled 2018-06-15: qty 100

## 2018-06-15 MED ORDER — CEFAZOLIN SODIUM-DEXTROSE 2-4 GM/100ML-% IV SOLN
INTRAVENOUS | Status: AC
  Filled 2018-06-15: qty 100

## 2018-06-15 MED ORDER — BUPIVACAINE-EPINEPHRINE (PF) 0.25% -1:200000 IJ SOLN
INTRAMUSCULAR | Status: AC
  Filled 2018-06-15: qty 30

## 2018-06-15 SURGICAL SUPPLY — 68 items
BLADE 10 SAFETY STRL DISP (BLADE) ×2 IMPLANT
BLADE CLIPPER SURG (BLADE) IMPLANT
BUR EGG ELITE 4.0 (BURR) IMPLANT
CABLE BIPOLOR RESECTION CORD (MISCELLANEOUS) ×2 IMPLANT
CANISTER SUCT 3000ML PPV (MISCELLANEOUS) ×2 IMPLANT
COVER MAYO STAND STRL (DRAPES) ×2 IMPLANT
COVER SURGICAL LIGHT HANDLE (MISCELLANEOUS) ×2 IMPLANT
COVER WAND RF STERILE (DRAPES) ×2 IMPLANT
DRAPE C-ARM 42X72 X-RAY (DRAPES) ×2 IMPLANT
DRAPE C-ARMOR (DRAPES) ×2 IMPLANT
DRAPE POUCH INSTRU U-SHP 10X18 (DRAPES) ×2 IMPLANT
DRAPE SURG 17X23 STRL (DRAPES) ×2 IMPLANT
DRAPE U-SHAPE 47X51 STRL (DRAPES) ×2 IMPLANT
DRSG OPSITE POSTOP 4X8 (GAUZE/BANDAGES/DRESSINGS) ×2 IMPLANT
DURAPREP 26ML APPLICATOR (WOUND CARE) ×2 IMPLANT
ELECT BLADE 4.0 EZ CLEAN MEGAD (MISCELLANEOUS) ×2
ELECT BLADE 6.5 EXT (BLADE) IMPLANT
ELECT PENCIL ROCKER SW 15FT (MISCELLANEOUS) ×2 IMPLANT
ELECT REM PT RETURN 9FT ADLT (ELECTROSURGICAL) ×2
ELECTRODE BLDE 4.0 EZ CLN MEGD (MISCELLANEOUS) ×1 IMPLANT
ELECTRODE REM PT RTRN 9FT ADLT (ELECTROSURGICAL) ×1 IMPLANT
GLOVE BIOGEL PI IND STRL 7.0 (GLOVE) ×4 IMPLANT
GLOVE BIOGEL PI IND STRL 8.5 (GLOVE) ×4 IMPLANT
GLOVE BIOGEL PI INDICATOR 7.0 (GLOVE) ×4
GLOVE BIOGEL PI INDICATOR 8.5 (GLOVE) ×4
GLOVE ECLIPSE 8.0 STRL XLNG CF (GLOVE) ×2 IMPLANT
GLOVE SKINSENSE NS SZ7.0 (GLOVE) ×2
GLOVE SKINSENSE STRL SZ7.0 (GLOVE) ×2 IMPLANT
GLOVE SS BIOGEL STRL SZ 8.5 (GLOVE) ×3 IMPLANT
GLOVE SUPERSENSE BIOGEL SZ 8.5 (GLOVE) ×3
GOWN STRL REUS W/ TWL LRG LVL3 (GOWN DISPOSABLE) ×3 IMPLANT
GOWN STRL REUS W/TWL 2XL LVL3 (GOWN DISPOSABLE) ×4 IMPLANT
GOWN STRL REUS W/TWL LRG LVL3 (GOWN DISPOSABLE) ×6
KIT BASIN OR (CUSTOM PROCEDURE TRAY) ×2 IMPLANT
KIT POSITION SURG JACKSON T1 (MISCELLANEOUS) ×2 IMPLANT
KIT TURNOVER KIT B (KITS) ×2 IMPLANT
NEEDLE 22X1 1/2 (OR ONLY) (NEEDLE) ×2 IMPLANT
NEEDLE SPNL 18GX3.5 QUINCKE PK (NEEDLE) ×4 IMPLANT
NS IRRIG 1000ML POUR BTL (IV SOLUTION) ×2 IMPLANT
PACK LAMINECTOMY ORTHO (CUSTOM PROCEDURE TRAY) ×2 IMPLANT
PACK UNIVERSAL I (CUSTOM PROCEDURE TRAY) ×2 IMPLANT
PAD ARMBOARD 7.5X6 YLW CONV (MISCELLANEOUS) ×8 IMPLANT
PATTIES SURGICAL .5 X.5 (GAUZE/BANDAGES/DRESSINGS) ×4 IMPLANT
PATTIES SURGICAL .5 X1 (DISPOSABLE) ×2 IMPLANT
POSITIONER HEAD PRONE TRACH (MISCELLANEOUS) ×2 IMPLANT
ROD RELINE MAS LORD 5.5X50 (Rod) ×4 IMPLANT
SCREW LOCK RELINE 5.5 TULIP (Screw) ×8 IMPLANT
SCREW SHANK RELINE 6.5X45MM 2C (Screw) ×8 IMPLANT
SLEEVE SURGEON STRL (DRAPES) ×2 IMPLANT
SPINE TULIP RELINE MOD (Neuro Prosthesis/Implant) ×8 IMPLANT
SPONGE LAP 4X18 RFD (DISPOSABLE) ×12 IMPLANT
SPONGE SURGIFOAM ABS GEL 100 (HEMOSTASIS) ×2 IMPLANT
STRIP CLOSURE SKIN 1/2X4 (GAUZE/BANDAGES/DRESSINGS) ×4 IMPLANT
SURGIFLO W/THROMBIN 8M KIT (HEMOSTASIS) ×12 IMPLANT
SUT BONE WAX W31G (SUTURE) ×2 IMPLANT
SUT MON AB 3-0 SH 27 (SUTURE) ×2
SUT MON AB 3-0 SH27 (SUTURE) ×1 IMPLANT
SUT VIC AB 1 CT1 18XCR BRD 8 (SUTURE) ×4 IMPLANT
SUT VIC AB 1 CT1 8-18 (SUTURE) ×8
SUT VIC AB 2-0 CT1 18 (SUTURE) ×2 IMPLANT
SYR BULB IRRIGATION 50ML (SYRINGE) ×2 IMPLANT
SYR CONTROL 10ML LL (SYRINGE) ×2 IMPLANT
TOWEL GREEN STERILE (TOWEL DISPOSABLE) ×2 IMPLANT
TOWEL GREEN STERILE FF (TOWEL DISPOSABLE) ×2 IMPLANT
TRAY FOLEY MTR SLVR 16FR STAT (SET/KITS/TRAYS/PACK) ×2 IMPLANT
TULIP SPINE RELINE MOD (Neuro Prosthesis/Implant) ×4 IMPLANT
WATER STERILE IRR 1000ML POUR (IV SOLUTION) ×2 IMPLANT
YANKAUER SUCT BULB TIP NO VENT (SUCTIONS) ×2 IMPLANT

## 2018-06-15 NOTE — Transfer of Care (Signed)
Immediate Anesthesia Transfer of Care Note  Patient: OCIEL RETHERFORD  Procedure(s) Performed: POSTERIOR LUMBAR FUSION 1 LEVEL LUMBAR 1 - LUMBAR 2 (N/A Back) Gill decompression L1-L2/ complete inferior facetectomyof L1. Posterior lateral arthrodesiswith autograft bone L1-L2.  Patient Location: PACU  Anesthesia Type:General  Level of Consciousness: drowsy  Airway & Oxygen Therapy: Patient Spontanous Breathing and Patient connected to face mask oxygen  Post-op Assessment: Report given to RN and Post -op Vital signs reviewed and stable  Post vital signs: Reviewed and stable  Last Vitals:  Vitals Value Taken Time  BP 164/101 06/15/2018  1:13 PM  Temp    Pulse 132 06/15/2018  1:17 PM  Resp 18 06/15/2018  1:17 PM  SpO2 100 % 06/15/2018  1:17 PM  Vitals shown include unvalidated device data.  Last Pain:  Vitals:   06/15/18 0444  TempSrc: Oral  PainSc:       Patients Stated Pain Goal: 0 (62/03/55 [id number])  Complications: No apparent anesthesia complications

## 2018-06-15 NOTE — Progress Notes (Signed)
Belonging brought from 3W: pants, shirt, underwear, cell phone and charger, glasses, hat, and wallet.

## 2018-06-15 NOTE — Brief Op Note (Signed)
06/15/2018  1:29 PM  PATIENT:  [name]  78 y.o. male  PRE-OPERATIVE DIAGNOSIS:  Degenerative Disc Disease  POST-OPERATIVE DIAGNOSIS:  Degenerative Disc Disease  PROCEDURE:  Procedure(s): POSTERIOR LUMBAR FUSION 1 LEVEL LUMBAR 1 - LUMBAR 2 (N/A) Gill decompression L1-L2/ complete inferior facetectomyof L1. Posterior lateral arthrodesiswith autograft bone L1-L2. (N/A)  SURGEON:  Surgeon(s) and Role:    Melina Schools, MD - Primary    * Susa Day, MD - Assisting  PHYSICIAN ASSISTANT:   ASSISTANTS: Dr Erasmo Score   ANESTHESIA:   general  EBL:  1100 mL   BLOOD ADMINISTERED:none  DRAINS: none   LOCAL MEDICATIONS USED:  MARCAINE     SPECIMEN:  No Specimen  DISPOSITION OF SPECIMEN:  N/A  COUNTS:  YES  TOURNIQUET:  * No tourniquets in log *  DICTATION: .Dragon Dictation  PLAN OF CARE: Admit to inpatient   PATIENT DISPOSITION:  PACU - hemodynamically stable.

## 2018-06-15 NOTE — Anesthesia Preprocedure Evaluation (Addendum)
Anesthesia Evaluation  Patient identified by MRN, date of birth, ID band Patient awake    Reviewed: Allergy & Precautions, NPO status , Patient's Chart, lab work & pertinent test results  Airway Mallampati: II  TM Distance: >3 FB Neck ROM: Full    Dental  (+) Teeth Intact, Dental Advisory Given   Pulmonary former smoker,    breath sounds clear to auscultation       Cardiovascular hypertension,  Rhythm:Regular Rate:Normal     Neuro/Psych    GI/Hepatic   Endo/Other    Renal/GU      Musculoskeletal   Abdominal   Peds  Hematology   Anesthesia Other Findings   Reproductive/Obstetrics                             Anesthesia Physical Anesthesia Plan  ASA: II  Anesthesia Plan: General   Post-op Pain Management:    Induction: Intravenous  PONV Risk Score and Plan: Ondansetron and Dexamethasone  Airway Management Planned: Oral ETT  Additional Equipment:   Intra-op Plan:   Post-operative Plan: Extubation in OR  Informed Consent: I have reviewed the patients History and Physical, chart, labs and discussed the procedure including the risks, benefits and alternatives for the proposed anesthesia with the patient or authorized representative who has indicated his/her understanding and acceptance.     Dental advisory given  Plan Discussed with: CRNA and Anesthesiologist  Anesthesia Plan Comments:         Anesthesia Quick Evaluation  

## 2018-06-15 NOTE — Op Note (Signed)
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  Left lateral recess stenosis L2-3  Postoperative diagnosis: Same  Operative procedure:  1.  Gill decompression L1/2.  Complete inferior facetectomy of L1.    2.   Bilateral L1/2 discectomy     3.  Left hemilaminotomy and lateral recess decompression L2-3     4.  Posterior lateral arthrodesis with autograft bone L1-2    5.  Instrumented fusion L1-2 utilizing pedicle screw rod construct.  First assistant: Erasmo Score, MD  Complications: None  Implants: Nuvasive pedicle screws: 6.5 x 45.  50 mm rod.  Indications: Joseph Moyer is a very pleasant 78 year old gentleman who presented to my office earlier this week with loss of the ability to ambulate x1 week, severe radicular right leg pain and neurological deficits.  At that time he elected to proceed with injection therapy.  He contacted my office yesterday because of severe unrelenting pain.  He also noted increasing symptoms now on the left side.  Although the patient did not lose control of his bowel and bladder, he states that he is becoming progressively weak now on the left side.  He is still unable to ambulate.  Repeat imaging study confirmed a large central to the left disc herniation at L1-2.  This disc started at the disc space and proceeded in a cranial and caudal fashion to the midportion of the L1 vertebral body down to the midportion of the L2 vertebral body.  In addition there is been progressive lateral recess stenosis on the left side at L2-3.  As a result of the progressive deficits and the severe pain we elected to move forward with surgery today.  Operative report  Patient is brought the operating room placed upon the operating room table.  After successful induction of general anesthesia and endotracheal intubation teds SCDs and a Foley were inserted.  Patient was turned prone onto the Wilson frame and all bony prominences  were well-padded.  The back was prepped and draped in a standard fashion and timeout was taken to confirm patient procedure and all other important data.  2 needles were placed in the back to mark out the incision site.  The incision site was marked and infiltrated with quarter percent Marcaine with epinephrine.  Midline incision was made starting at the inferior aspect of the T12 spinous process and proceeding to the inferior aspect of the L3 spinous process.  Sharp dissection was carried out down to the deep fascia.  The deep fascia was then sharply incised and using a Cobb and Bovie I stripped the paraspinal muscles to expose the spinous process and lamina of L1 and L2 and the portion of that of L3.  I then continued out laterally over the facet complex to expose the L1 and L2 transverse processes.  At this point with the posterior approach complete we proceeded with the instrumentation.  Fluoroscopic guidance was used to not only confirm the level but also assist in pedicle screw placement.  Pedicle probe was placed on the lateral aspect of the facet complex and advanced into the pedicle.  Pedicle probe was advanced until it near the posterior wall of the vertebral body.  Once at this location I switched the fluoroscopy view to the AP view to confirm that I was still within the pedicle boundaries.  Once this was confirmed I advanced the probe into the vertebral body.  I removed the probe and sounded the canal with a ball-tipped  feeler.  Confirming solid bony canal I then obtained the 6.5 x 45 mm length pedicle screw in place and into the pedicle.  I had excellent purchase and the screw was well fixed.  Using this exact same technique I placed my pedicle screw at L1 and on the contralateral side at L1 and L2.  Once all 4 pedicle screws were placed I confirmed satisfactory position in both the AP and lateral planes.  At this point with the instrumentation complete we move forward with the decompression.  I  remove the posterior spinous process of L1 and used an osteotome to resect the inferior facet.  At this point I was able to use my 3 mm and 2 mm Kerrison Roger to perform a generous laminotomy of L1 which allowed me to remove the entire inferior L1 facet on the left side.  I was also able to take down the pars.  Once this was done I used a Penfield 4 to dissect through the central aspect of the ligamentum flavum and then I gently dissected along the undersurface of the ligamentum flavum creating my plane between the thecal sac and the ligamentum flavum.  I then remove the ligamentum flavum with Kerrison rongeurs.  Having remove the facet I was able to work in the lateral recess and I did not need to retract the thecal sac.  Identified the medial border of the L2 pedicle and I removed the medial aspect of the superior L2 facet so I had an adequate lateral recess decompression.  At this point I could now visualize the very large disc herniation in the central and left lateral recess.  This was consistent with his preoperative MRI.  Using a Penfield 4 I was able to gently dissect through the remaining annulus and begin to mobilize disc herniation.  Multiple large fragments of disc material gently removed.  The disc fragment spanned from the midportion of L1 down to the midportion of L2.  Using my fine nerve hook and Woodson elevator I was able to sweep circumferentially all the way over to the contralateral side.  I had removed multiple fragments of disc material and ultimately the amount removed was consistent with what I saw on the preoperative imaging.  At this point with the left lateral recess decompression complete and the bulk of the disc herniation removed I was able to gently start my dissection over on the right-hand side.  Based on the imaging studies the thecal sac and can significantly compressed up against the right lamina and facet.  Using my 2 mm Kerrison Roger and the Hamilton 4 I gently dissected and  removed bone.  I again remove the inferior L1 facet complex as well as the medial aspect of the superior L2 facet complex.  Upon my dissection I was able to visualize a very adherent cystic-like structure.  I gently began dissecting in both Joseph Moyer and myself ultimately felt as though it was consistent with a facet cyst.  We were able to dissect lateral to the cyst.  At this point I gently began to debulk the cyst.  Using my Penfield 4 I attempted to develop a plane between the thecal sac and the cyst but it was quite difficult.  Ultimately I felt as though it was significantly adherent to the thecal sac and that further dissection would increase the potential risk of a traumatic durotomy or possible nerve injury.  Since I completely decompressed the area the remaining portion of that cyst was essentially no  longer being compressed.  At this point I could freely pass a [facility] out the L2 and L1 neural foramen bilaterally I could sweep my [facility] circumferentially starting from the left side going over to the right at the disc space level.  I removed all the disc fragments from the mid body L1 down to the mid body of L2.  At this point I felt as though even though the cystlike structure was left behind and was adequately decompressed.  At this point identify the L2 lamina and using a 3 mm Kerrison Roger formed a hemilaminotomy on the left side at L2.  I then dissected through the ligamentum flavum my Penfield 4 and removed it with my Surveyor, quantity.  I then dissected into the lateral recess removing the overhanging osteophyte.  At this point I was able to pass a [facility] elevator superiorly and inferiorly underneath the remaining portion of the L2 lamina and there was no further lateral recess stenosis.  Was also able to pass the [facility] elevator out the L3 foramen confirming an adequate decompression.  At this point with the decompression portion complete I irrigated the wound copiously  normal saline.  High-speed bur was used to decorticate the transverse processes of L1 and L2 and the bone graft from the decompression was placed in the posterior lateral gutter at L1-2.  The polyaxial heads were attached to the screws and the rods were inserted and secured with top locking nuts.  The locking nuts were torqued off according manufacturer standards.  This point final x-rays were taken demonstrating satisfactory position of the pedicle screw rod construct as well as the decompression.  I copiously irrigated the wound with normal saline and using bipolar cautery as well as FloSeal I obtained hemostasis.  Thrombin-soaked Gelfoam patty was placed over the L2-3 left laminotomy site and over the central decompression at L1-2.  I then closed the deep fascia in a layered fashion with interrupted #1 Vicryl suture, 2-0 Vicryl suture, and 3-0 Monocryl for the skin.  Steri-Strips and a dry dressing were applied and the patient was ultimately extubated and transferred the PACU without incident.  At the end of the case all needle sponge counts were correct.  There were no significant adverse intraoperative events.

## 2018-06-15 NOTE — Anesthesia Procedure Notes (Signed)
Procedure Name: Intubation Date/Time: 06/15/2018 8:02 AM Performed by: Cleda Daub, CRNA Pre-anesthesia Checklist: Patient identified, Emergency Drugs available, Suction available and Patient being monitored Patient Re-evaluated:Patient Re-evaluated prior to induction Oxygen Delivery Method: Circle system utilized Preoxygenation: Pre-oxygenation with 100% oxygen Induction Type: IV induction Ventilation: Mask ventilation without difficulty, Mask ventilation throughout procedure and Oral airway inserted - appropriate to patient size Laryngoscope Size: Sabra Heck and 2 Grade View: Grade I Tube type: Oral Tube size: 8.0 mm Number of attempts: 1 Airway Equipment and Method: Stylet Placement Confirmation: ETT inserted through vocal cords under direct vision,  positive ETCO2 and breath sounds checked- equal and bilateral Secured at: 23 cm Tube secured with: Tape Dental Injury: Teeth and Oropharynx as per pre-operative assessment

## 2018-06-15 NOTE — Progress Notes (Signed)
PROGRESS NOTE  Joseph Moyer TFT:[id number] DOB: [birth year] DOA: 06/14/2018 PCP: [name], MD   LOS: 1 day   Brief Narrative / Interim history: 78 year old male with history of hypertension, hyperlipidemia, otherwise healthy presents to the hospital as an elective admission on the orthopedic surgery service. He is s/p  1.  Gill decompression L1/2.  Complete inferior facetectomy of L1.                                     2.   Bilateral L1/2 discectomy                                      3.  Left hemilaminotomy and lateral recess decompression L2-3                                      4.  Posterior lateral arthrodesis with autograft bone L1-2                                     5.  Instrumented fusion L1-2 utilizing pedicle screw rod construct.  Subjective: Seen post op, very drowsy. No complaints  Assessment & Plan: Active Problems:   Herniated lumbar intervertebral disc   S/P lumbar fusion   Principal Problem Herniated lumbar intervertebral disc- OR today  Active Problems HTN -resume home meds post op as indicated. Probably in am   DM -+ family history, A1C 6.8 4 years ago and 6.5 now. This is indicative of DM, will follow tomorrow and discuss with patient in detail. His diabetes seems diet controlled.  Scheduled Meds: . fentaNYL      . gabapentin  300 mg Oral TID  . labetalol      . methocarbamol      . morphine      . oxyCODONE      . sodium chloride flush  3 mL Intravenous Q12H   Continuous Infusions: . sodium chloride    . ceFAZolin    .  ceFAZolin (ANCEF) IV    . lactated ringers 85 mL/hr at 06/15/18 1453  . methocarbamol (ROBAXIN) IV     PRN Meds:.acetaminophen **OR** acetaminophen, magnesium hydroxide, menthol-cetylpyridinium **OR** phenol, methocarbamol **OR** methocarbamol (ROBAXIN) IV, ondansetron **OR** ondansetron (ZOFRAN) IV, oxyCODONE, oxyCODONE, sodium chloride flush, sodium phosphate  Antimicrobials:  None    Objective: Vitals:   06/15/18 1343 06/15/18 1346 06/15/18 1400 06/15/18 1435  BP: 130/76 (!) 125/97 (!) 141/86 (!) 145/88  Pulse: (!) 108 97 99 84  Resp: 13 11 11    Temp:    98.8 F (37.1 C)  TempSrc:    Oral  SpO2: 98% 99% 99% 99%  Weight:        Intake/Output Summary (Last 24 hours) at 06/15/2018 1507 Last data filed at 06/15/2018 1317 Gross per 24 hour  Intake 2900 ml  Output 2000 ml  Net 900 ml   Filed Weights   06/15/18 0704  Weight: 100 kg    Examination:  Constitutional: NAD Eyes: PERRL, lids and conjunctivae normal ENMT: Mucous membranes are moist.   Data Reviewed: I have independently reviewed following labs and imaging studies   CBC: Recent Labs  Lab 06/14/18  1326  WBC 5.7  HGB 14.1  HCT 42.2  MCV 86.1  PLT 914   Basic Metabolic Panel: Recent Labs  Lab 06/14/18 1326  NA 136  K 4.5  CL 103  CO2 23  GLUCOSE 113*  BUN 14  CREATININE 0.86  CALCIUM 9.3   GFR: CrCl cannot be calculated (Unknown ideal weight.). Liver Function Tests: No results for input(s): AST, ALT, ALKPHOS, BILITOT, PROT, ALBUMIN in the last 168 hours. No results for input(s): LIPASE, AMYLASE in the last 168 hours. No results for input(s): AMMONIA in the last 168 hours. Coagulation Profile: Recent Labs  Lab 06/14/18 1326  INR 1.1   Cardiac Enzymes: No results for input(s): CKTOTAL, CKMB, CKMBINDEX, TROPONINI in the last 168 hours. BNP (last 3 results) No results for input(s): PROBNP in the last 8760 hours. HbA1C: Recent Labs    06/15/18 0424  HGBA1C 6.5*   CBG: No results for input(s): GLUCAP in the last 168 hours. Lipid Profile: No results for input(s): CHOL, HDL, LDLCALC, TRIG, CHOLHDL, LDLDIRECT in the last 72 hours. Thyroid Function Tests: No results for input(s): TSH, T4TOTAL, FREET4, T3FREE, THYROIDAB in the last 72 hours. Anemia Panel: No results for input(s): VITAMINB12, FOLATE, FERRITIN, TIBC, IRON, RETICCTPCT in the last 72 hours. Urine analysis:    Component Value  Date/Time   COLORURINE YELLOW 06/14/2018 1329   APPEARANCEUR HAZY (A) 06/14/2018 1329   LABSPEC 1.013 06/14/2018 1329   PHURINE 7.0 06/14/2018 1329   GLUCOSEU NEGATIVE 06/14/2018 1329   HGBUR NEGATIVE 06/14/2018 1329   BILIRUBINUR NEGATIVE 06/14/2018 1329   KETONESUR NEGATIVE 06/14/2018 1329   PROTEINUR NEGATIVE 06/14/2018 1329   UROBILINOGEN 1.0 07/30/2014 1444   NITRITE NEGATIVE 06/14/2018 1329   LEUKOCYTESUR NEGATIVE 06/14/2018 1329   Sepsis Labs: Invalid input(s): PROCALCITONIN, LACTICIDVEN  Recent Results (from the past 240 hour(s))  SARS Coronavirus 2 (CEPHEID - Performed in [place] hospital lab), Hosp Order     Status: None   Collection Time: 06/14/18  6:13 PM  Result Value Ref Range Status   SARS Coronavirus 2 NEGATIVE NEGATIVE Final    Comment: (NOTE) If result is NEGATIVE SARS-CoV-2 target nucleic acids are NOT DETECTED. The SARS-CoV-2 RNA is generally detectable in upper and lower  respiratory specimens during the acute phase of infection. The lowest  concentration of SARS-CoV-2 viral copies this assay can detect is 250  copies / mL. A negative result does not preclude SARS-CoV-2 infection  and should not be used as the sole basis for treatment or other  patient management decisions.  A negative result may occur with  improper specimen collection / handling, submission of specimen other  than nasopharyngeal swab, presence of viral mutation(s) within the  areas targeted by this assay, and inadequate number of viral copies  (<250 copies / mL). A negative result must be combined with clinical  observations, patient history, and epidemiological information. If result is POSITIVE SARS-CoV-2 target nucleic acids are DETECTED. The SARS-CoV-2 RNA is generally detectable in upper and lower  respiratory specimens dur ing the acute phase of infection.  Positive  results are indicative of active infection with SARS-CoV-2.  Clinical  correlation with patient history and  other diagnostic information is  necessary to determine patient infection status.  Positive results do  not rule out bacterial infection or co-infection with other viruses. If result is PRESUMPTIVE POSTIVE SARS-CoV-2 nucleic acids MAY BE PRESENT.   A presumptive positive result was obtained on the submitted specimen  and confirmed on repeat testing.  While 2019 novel coronavirus  (SARS-CoV-2) nucleic acids may be present in the submitted sample  additional confirmatory testing may be necessary for epidemiological  and / or clinical management purposes  to differentiate between  SARS-CoV-2 and other Sarbecovirus currently known to infect humans.  If clinically indicated additional testing with an alternate test  methodology [phone]) is advised. The SARS-CoV-2 RNA is generally  detectable in upper and lower respiratory sp ecimens during the acute  phase of infection. The expected result is Negative. Fact Sheet for Patients:  StrictlyIdeas.no Fact Sheet for Healthcare Providers: BankingDealers.co.za This test is not yet approved or cleared by the [place] FDA and has been authorized for detection and/or diagnosis of SARS-CoV-2 by FDA under an Emergency Use Authorization (EUA).  This EUA will remain in effect (meaning this test can be used) for the duration of the COVID-19 declaration under Section 564(b)(1) of the Act, 21 U.S.C. section 360bbb-3(b)(1), unless the authorization is terminated or revoked sooner. Performed at [facility], [place] [address] [place], [place] [address]   Surgical PCR screen     Status: None   Collection Time: 06/14/18  6:13 PM  Result Value Ref Range Status   MRSA, PCR NEGATIVE NEGATIVE Final   Staphylococcus aureus NEGATIVE NEGATIVE Final    Comment: (NOTE) The Xpert SA Assay (FDA approved for NASAL specimens in patients 89 years of age and older), is one component of a comprehensive surveillance  program. It is not intended to diagnose infection nor to guide or monitor treatment. Performed at [facility], [place] [address] [place], [place] [address]       Radiology Studies: X-ray Chest Pa And Lateral  Result Date: 06/14/2018 CLINICAL DATA:  Preoperative examination. EXAM: CHEST - 2 VIEW COMPARISON:  Chest x-ray dated July 30, 2014. FINDINGS: The heart size and mediastinal contours are within normal limits. Both lungs are clear. The visualized skeletal structures are unremarkable. IMPRESSION: No active cardiopulmonary disease. Electronically Signed   By: Titus Dubin M.D.   On: 06/14/2018 14:41   Dg Lumbar Spine 2-3 Views  Result Date: 06/15/2018 CLINICAL DATA:  L1-L2 posterior decompression EXAM: DG C-ARM 61-120 MIN; LUMBAR SPINE - 2-3 VIEW FLUOROSCOPY TIME:  2 minutes, 13 seconds COMPARISON:  Lumbar spine MRI-06/14/2018 FINDINGS: Four spot cone fluoroscopic images of the lumbar spine are provided for review including dedicated lateral projection of the lumbosacral junction. Provided images demonstrate sequela of L1-L2 paraspinal fusion. Alignment appears anatomic. Expected subcutaneous emphysema about the operative site. No radiopaque foreign body. IMPRESSION: Post L1-L2 paraspinal fusion without evidence of complication. Electronically Signed   By: Sandi Mariscal M.D.   On: 06/15/2018 14:54   Mr Lumbar Spine W Wo Contrast  Result Date: 06/14/2018 CLINICAL DATA:  Acute low back pain with bilateral leg pain and weakness since lifting a heavy bag in the yard 2 weeks ago. EXAM: MRI LUMBAR SPINE WITHOUT AND WITH CONTRAST TECHNIQUE: Multiplanar and multiecho pulse sequences of the lumbar spine were obtained without and with intravenous contrast. CONTRAST:  9 mL Gadavist intravenous contrast. COMPARISON:  MR lumbar spine dated July 24, 2014. FINDINGS: Segmentation:  Standard. Alignment:  Unchanged trace retrolisthesis at L5-S1. Vertebrae: No fracture, evidence of discitis, or bone lesion.  Unchanged chronic degenerative fatty marrow endplate changes at L9-F7 and L5-S1. Conus medullaris and cauda equina: Conus extends to the L1-L2 level. No intradural enhancement. Paraspinal and other soft tissues: Negative. Disc levels: T11-T12: Negative. T12-L1: Negative disc. Mild bilateral facet arthropathy. No stenosis. L1-L2:  New large central and left-sided disc extrusion migrating both superiorly and inferiorly, measuring 2.6 cm in craniocaudal dimension. This results in new severe spinal canal stenosis with posterior displacement of the conus. A small amount of disc material extends into the left neural foramen (series 6, image 9). Unchanged mild bilateral facet arthropathy. L2-L3: Central and left paracentral disc protrusion. Moderate to severe bilateral facet arthropathy. Progressive moderate to severe central spinal canal and lateral recess stenosis. Unchanged mild bilateral neuroforaminal stenosis. L3-L4: Unchanged small circumferential disc osteophyte complex. Unchanged moderate bilateral facet arthropathy. Unchanged borderline mild spinal canal stenosis. Unchanged mild bilateral neuroforaminal stenosis. L4-L5: Unchanged small circumferential disc osteophyte complex and mild bilateral facet arthropathy. Unchanged mild left lateral recess stenosis. Unchanged moderate bilateral neuroforaminal stenosis. No spinal canal stenosis. L5-S1: Unchanged circumferential disc osteophyte complex and mild bilateral facet arthropathy. Unchanged moderate to severe left and moderate right neuroforaminal stenosis. No spinal canal stenosis. IMPRESSION: 1. 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 2. Progressive moderate to severe spinal canal and lateral recess stenosis at L2-L3. 3. Remaining multilevel lumbar spondylosis as described above is similar to prior study, including moderate  to severe left neuroforaminal stenosis at L5-S1. Electronically Signed   By: Titus Dubin M.D.   On: 06/14/2018 16:54   Dg C-arm 1-60 Min  Result Date: 06/15/2018 CLINICAL DATA:  L1-L2 posterior decompression EXAM: DG C-ARM 61-120 MIN; LUMBAR SPINE - 2-3 VIEW FLUOROSCOPY TIME:  2 minutes, 13 seconds COMPARISON:  Lumbar spine MRI-06/14/2018 FINDINGS: Four spot cone fluoroscopic images of the lumbar spine are provided for review including dedicated lateral projection of the lumbosacral junction. Provided images demonstrate sequela of L1-L2 paraspinal fusion. Alignment appears anatomic. Expected subcutaneous emphysema about the operative site. No radiopaque foreign body. IMPRESSION: Post L1-L2 paraspinal fusion without evidence of complication. Electronically Signed   By: Sandi Mariscal M.D.   On: 06/15/2018 14:54    Marzetta Board, MD, PhD [facility]  Contact via  [email or web address]  [place] P: [phone]  F: [phone]

## 2018-06-15 NOTE — Progress Notes (Signed)
Orthopedic Tech Progress Note Patient Details:  Joseph Moyer [birth year] [id number]  Patient ID: [name], male   DOB: Jan 03, 1941, 78 y.o.   MRN: [id number]   [name] 06/15/2018, 3:17 PMCalled [employer] for Lumbar brace.

## 2018-06-15 NOTE — Progress Notes (Signed)
No change in patient's clinical exam.  Patient is COVID virus negative  Plan on moving forward with instrumented fusion L1-2 with posterior decompression L1 to 3.  I have discussed the case again with the patient and all of his questions were addressed.

## 2018-06-15 NOTE — Anesthesia Postprocedure Evaluation (Signed)
Anesthesia Post Note  Patient: Joseph Moyer  Procedure(s) Performed: POSTERIOR LUMBAR FUSION 1 LEVEL LUMBAR 1 - LUMBAR 2 (N/A Back) Gill decompression L1-L2/ complete inferior facetectomyof L1. Posterior lateral arthrodesiswith autograft bone L1-L2. (N/A Back)     Patient location during evaluation: PACU Anesthesia Type: General Level of consciousness: awake and alert Pain management: pain level controlled Vital Signs Assessment: post-procedure vital signs reviewed and stable Respiratory status: spontaneous breathing, nonlabored ventilation, respiratory function stable and patient connected to nasal cannula oxygen Cardiovascular status: blood pressure returned to baseline and stable Postop Assessment: no apparent nausea or vomiting Anesthetic complications: no    Last Vitals:  Vitals:   06/15/18 1435 06/15/18 1638  BP: (!) 145/88 (!) 148/83  Pulse: 84 90  Resp:    Temp: 37.1 C 37.1 C  SpO2: 99% 99%    Last Pain:  Vitals:   06/15/18 1655  TempSrc:   PainSc: 0-No pain                 Chord Takahashi COKER

## 2018-06-16 LAB — BASIC METABOLIC PANEL
Anion gap: 8 (ref 5–15)
BUN: 14 mg/dL (ref 8–23)
CO2: 25 mmol/L (ref 22–32)
Calcium: 8.5 mg/dL — ABNORMAL LOW (ref 8.9–10.3)
Chloride: 99 mmol/L (ref 98–111)
Creatinine, Ser: 0.9 mg/dL (ref 0.61–1.24)
GFR calc Af Amer: >60 mL/min (ref >60)
GFR calc non Af Amer: >60 mL/min (ref >60)
Glucose, Bld: 115 mg/dL — ABNORMAL HIGH (ref 70–99)
Potassium: 4.3 mmol/L (ref 3.5–5.1)
Sodium: 132 mmol/L — ABNORMAL LOW (ref 135–145)

## 2018-06-16 LAB — CBC
HCT: 30.7 % — ABNORMAL LOW (ref 39.0–52.0)
Hemoglobin: 10.3 g/dL — ABNORMAL LOW (ref 13.0–17.0)
MCH: 29.2 pg (ref 26.0–34.0)
MCHC: 33.6 g/dL (ref 30.0–36.0)
MCV: 87 fL (ref 80.0–100.0)
Platelets: 202 10*3/uL (ref 150–400)
RBC: 3.53 MIL/uL — ABNORMAL LOW (ref 4.22–5.81)
RDW: 14.4 % (ref 11.5–15.5)
WBC: 6.8 10*3/uL (ref 4.0–10.5)
nRBC: 0 % (ref 0.0–0.2)

## 2018-06-16 MED ORDER — FERROUS SULFATE 325 (65 FE) MG PO TABS
325.0000 mg | ORAL_TABLET | Freq: Two times a day (BID) | ORAL | Status: DC
  Administered 2018-06-16 – 2018-06-17 (×3): 325 mg via ORAL
  Filled 2018-06-16 (×3): qty 1

## 2018-06-16 NOTE — Plan of Care (Signed)

## 2018-06-16 NOTE — Progress Notes (Addendum)
Rehab Admissions Coordinator Note:  Patient was screened by Michel Santee, PT, DPT  for appropriateness for an Inpatient Acute Rehab Consult.  At this time, we are recommending Inpatient Rehab consult.  Please place an order for IP Rehab MD consult.    Michel Santee 06/16/2018, 2:57 PM  I can be reached at [phone].

## 2018-06-16 NOTE — Evaluation (Signed)
Occupational Therapy Evaluation Patient Details Name: Joseph Moyer MRN: [id number] DOB: [birth year] Today's Date: 06/16/2018    History of Present Illness 78 year old male with history of hypertension, hyperlipidemia presents s/p posterior lumbar fusion L1-L2.   Clinical Impression   PTA patient independent and driving.  Admitted for above and limited by problem list below, including back pain, precautions, decreased functional use of R LE during mobility and ADLs, impaired balance.  Educated on precautions, brace mgmt/wear schedule and ADL compensatory techniques. He requires setup assist for UB ADLs, max assist for LB ADls and mod assist for sit to stand transfers. Believe he will best benefit from intensive CIR level rehab in order to maximize independence and return to PLOF independent level with ADLs and mobility.      Follow Up Recommendations  CIR    Equipment Recommendations  None recommended by OT    Recommendations for Other Services Rehab consult     Precautions / Restrictions Precautions Precautions: Fall;Back Precaution Booklet Issued: Yes (comment) Precaution Comments: verbally reviewed with patient Required Braces or Orthoses: Spinal Brace Spinal Brace: Lumbar corset Restrictions Weight Bearing Restrictions: No      Mobility Bed Mobility               General bed mobility comments: received up in chair  Transfers Overall transfer level: Needs assistance Equipment used: Rolling walker (2 wheeled) Transfers: Sit to/from Stand Sit to Stand: Mod assist         General transfer comment: mod assist to power up into standing and for stability, cueing for hand placement and safety     Balance Overall balance assessment: Needs assistance Sitting-balance support: Feet supported Sitting balance-Leahy Scale: Fair     Standing balance support: Bilateral upper extremity supported;During functional activity Standing balance-Leahy Scale: Poor Standing  balance comment: heavy reliance on external support and assist                           ADL either performed or assessed with clinical judgement   ADL Overall ADL's : Needs assistance/impaired     Grooming: Set up;Sitting   Upper Body Bathing: Set up;Sitting   Lower Body Bathing: Sit to/from stand;Maximal assistance Lower Body Bathing Details (indicate cue type and reason): unable to complete figure 4 technique, mod assist sit<>stand  Upper Body Dressing : Set up;Sitting   Lower Body Dressing: Maximal assistance;Sit to/from stand Lower Body Dressing Details (indicate cue type and reason): unable to complete figure 4 technique, mod assist sit<>stand  Toilet Transfer: Moderate assistance;RW Toilet Transfer Details (indicate cue type and reason): simulated sit<>stand from recliner  Toileting- Clothing Manipulation and Hygiene: Maximal assistance;Sit to/from stand       Functional mobility during ADLs: Moderate assistance;Rolling walker General ADL Comments: limited by pain, decreased functional use of R LE, impaired balance      Vision         Perception     Praxis      Pertinent Vitals/Pain Pain Assessment: 0-10 Pain Score: 5  Pain Location: low back and operative site Pain Descriptors / Indicators: Grimacing;Guarding;Operative site guarding;Sore Pain Intervention(s): Monitored during session;Repositioned     Hand Dominance Right   Extremity/Trunk Assessment Upper Extremity Assessment Upper Extremity Assessment: Overall WFL for tasks assessed   Lower Extremity Assessment Lower Extremity Assessment: Defer to PT evaluation RLE Deficits / Details: noted assymetrical weakness and sesnory deficits RLE, grossly 3-/5  RLE Sensation: decreased light touch RLE Coordination: decreased fine  motor   Cervical / Trunk Assessment Cervical / Trunk Assessment: Other exceptions Cervical / Trunk Exceptions: s/p spinal surgery   Communication  Communication Communication: No difficulties   Cognition Arousal/Alertness: Awake/alert Behavior During Therapy: WFL for tasks assessed/performed Overall Cognitive Status: Within Functional Limits for tasks assessed                                     General Comments       Exercises     Shoulder Instructions      Home Living Family/patient expects to be discharged to:: Private residence Living Arrangements: Spouse/significant other Available Help at Discharge: Family Type of Home: House Home Access: (threshold)     Home Layout: Multi-level     Bathroom Shower/Tub: Teacher, early years/pre: Standard     Home Equipment: Wheelchair - manual;Bedside commode          Prior Functioning/Environment Level of Independence: Independent                 OT Problem List: Decreased strength;Decreased activity tolerance;Impaired balance (sitting and/or standing);Decreased coordination;Decreased knowledge of use of DME or AE;Decreased knowledge of precautions;Pain      OT Treatment/Interventions: Self-care/ADL training;Energy conservation;DME and/or AE instruction;Therapeutic activities;Patient/family education;Balance training    OT Goals(Current goals can be found in the care plan section) Acute Rehab OT Goals Patient Stated Goal: to be able to walk OT Goal Formulation: With patient Time For Goal Achievement: 06/30/18 Potential to Achieve Goals: Good  OT Frequency: Min 3X/week   Barriers to D/C:            Co-evaluation              AM-PAC OT "6 Clicks" Daily Activity     Outcome Measure Help from another person eating meals?: None Help from another person taking care of personal grooming?: None(seated) Help from another person toileting, which includes using toliet, bedpan, or urinal?: A Lot Help from another person bathing (including washing, rinsing, drying)?: A Lot Help from another person to put on and taking off regular  upper body clothing?: None Help from another person to put on and taking off regular lower body clothing?: A Lot 6 Click Score: 18   End of Session Equipment Utilized During Treatment: Rolling walker;Back brace Nurse Communication: Mobility status  Activity Tolerance: Patient tolerated treatment well Patient left: in chair;with call bell/phone within reach;with chair alarm set  OT Visit Diagnosis: Other abnormalities of gait and mobility (R26.89);Pain Pain - part of body: (back)                Time: 6387-5643 OT Time Calculation (min): 19 min Charges:  OT General Charges $OT Visit: 1 Visit OT Evaluation $OT Eval Moderate Complexity: 1 Mod  Delight Stare, OT Acute Rehabilitation Services Pager [phone] Office [phone]   Delight Stare 06/16/2018, 1:47 PM

## 2018-06-16 NOTE — Evaluation (Signed)
Physical Therapy Evaluation Patient Details Name: Joseph Moyer MRN: [id number] DOB: Mar 13, 1940 Today's Date: 06/16/2018   History of Present Illness  78 year old male with history of hypertension, hyperlipidemia presents s/p lumbar fusion L1-L2, Left hemilaminotomy and lateral recess decompression L2-3.  Clinical Impression  Orders received for PT evaluation. Patient demonstrates deficits in functional mobility as indicated below. Will benefit from continued skilled PT to address deficits and maximize function. Will see as indicated and progress as tolerated.  At this time, patient with noted RLE weakness requiring increased physical assist for all aspects of mobility, prior to admission patient was fiercely independent, may benefit from CIR consult to assist with neurologic recovery of function prior to d/c home. Educated patient on precautions and provided handout.    Follow Up Recommendations CIR;Supervision/Assistance - 24 hour    Equipment Recommendations  3in1 (PT);Rolling walker with 5" wheels    Recommendations for Other Services Rehab consult     Precautions / Restrictions Precautions Precautions: Fall;Back Precaution Booklet Issued: Yes (comment) Precaution Comments: verbally reviewed with patient Required Braces or Orthoses: Spinal Brace Spinal Brace: Lumbar corset Restrictions Weight Bearing Restrictions: No      Mobility  Bed Mobility               General bed mobility comments: received up in chair  Transfers Overall transfer level: Needs assistance Equipment used: Rolling walker (2 wheeled) Transfers: Sit to/from Stand Sit to Stand: Mod assist         General transfer comment: moderate assist to power up to standing with VCs for hand placement and position during transitional movements  Ambulation/Gait Ambulation/Gait assistance: Min assist;Mod assist Gait Distance (Feet): 12 Feet Assistive device: Rolling walker (2 wheeled) Gait  Pattern/deviations: Step-to pattern;Decreased stride length;Decreased stance time - right;Decreased weight shift to left;Decreased dorsiflexion - right;Shuffle Gait velocity: decreased   General Gait Details: patient with significant RLE deficits requiring manual faciliation of RLE stride forward and tactile cues for weight shift during transition. Heavy reliance on RW for UE support to off load. VCs for sequencing and positoning  Stairs            Wheelchair Mobility    Modified Rankin (Stroke Patients Only)       Balance Overall balance assessment: Needs assistance Sitting-balance support: Feet supported Sitting balance-Leahy Scale: Fair     Standing balance support: Bilateral upper extremity supported;During functional activity Standing balance-Leahy Scale: Poor Standing balance comment: heavy reliance on external support and assist                             Pertinent Vitals/Pain Pain Assessment: 0-10 Pain Score: 5  Pain Location: low back and operative site Pain Descriptors / Indicators: Grimacing;Guarding;Operative site guarding;Sore Pain Intervention(s): Monitored during session    Home Living Family/patient expects to be discharged to:: Private residence Living Arrangements: Spouse/significant other Available Help at Discharge: Family Type of Home: House Home Access: (threshold)     Home Layout: Multi-level Home Equipment: Wheelchair - manual      Prior Function Level of Independence: Independent               Hand Dominance   Dominant Hand: Right    Extremity/Trunk Assessment   Upper Extremity Assessment Upper Extremity Assessment: Overall WFL for tasks assessed    Lower Extremity Assessment Lower Extremity Assessment: RLE deficits/detail RLE Deficits / Details: noted assymetrical weakness and sesnory deficits RLE, grossly 3-/5  RLE Sensation: decreased  light touch RLE Coordination: decreased fine motor    Cervical /  Trunk Assessment Cervical / Trunk Assessment: (s/p spinal surgery)  Communication   Communication: No difficulties  Cognition Arousal/Alertness: Awake/alert Behavior During Therapy: WFL for tasks assessed/performed Overall Cognitive Status: Within Functional Limits for tasks assessed                                        General Comments      Exercises     Assessment/Plan    PT Assessment Patient needs continued PT services  PT Problem List Decreased strength;Decreased activity tolerance;Decreased balance;Decreased mobility;Decreased coordination;Pain;Impaired sensation       PT Treatment Interventions DME instruction;Gait training;Stair training;Functional mobility training;Therapeutic activities;Therapeutic exercise;Balance training;Neuromuscular re-education;Patient/family education    PT Goals (Current goals can be found in the Care Plan section)  Acute Rehab PT Goals Patient Stated Goal: to be able to walk PT Goal Formulation: With patient Time For Goal Achievement: 06/30/18 Potential to Achieve Goals: Good    Frequency Min 5X/week   Barriers to discharge        Co-evaluation               AM-PAC PT "6 Clicks" Mobility  Outcome Measure Help needed turning from your back to your side while in a flat bed without using bedrails?: A Little Help needed moving from lying on your back to sitting on the side of a flat bed without using bedrails?: A Little Help needed moving to and from a bed to a chair (including a wheelchair)?: A Lot Help needed standing up from a chair using your arms (e.g., wheelchair or bedside chair)?: A Lot Help needed to walk in hospital room?: A Lot Help needed climbing 3-5 steps with a railing? : A Lot 6 Click Score: 14    End of Session Equipment Utilized During Treatment: Gait belt;Back brace Activity Tolerance: Patient tolerated treatment well Patient left: in chair;with call bell/phone within reach;with chair alarm  set Nurse Communication: Mobility status PT Visit Diagnosis: Muscle weakness (generalized) (M62.81);Difficulty in walking, not elsewhere classified (R26.2);Other symptoms and signs involving the nervous system (R29.898)    Time: 4827-0786 PT Time Calculation (min) (ACUTE ONLY): 21 min   Charges:   PT Evaluation $PT Eval Moderate Complexity: 1 Mod          Alben Deeds, PT DPT  Board Certified Neurologic Specialist Acute Rehabilitation Services Pager [phone] [place] 06/16/2018, 12:17 PM

## 2018-06-16 NOTE — Progress Notes (Signed)
CSW consulted for SNF placement. Patient is currently recommended SNF. CSW signing off at this time, please re-consult for future social work needs.  Lamonte Richer, LCSW, [place] Worker II [phone]

## 2018-06-16 NOTE — Progress Notes (Addendum)
PROGRESS NOTE  Joseph Moyer:[id number] DOB: 1941/01/11 DOA: 06/14/2018 PCP: [name], MD   LOS: 2 days   Brief Narrative / Interim history: 78 year old male with history of hypertension, hyperlipidemia, otherwise healthy presents to the hospital as an elective admission on the orthopedic surgery service. He is s/p  1.  Gill decompression L1/2.  Complete inferior facetectomy of L1.                                     2.   Bilateral L1/2 discectomy                                      3.  Left hemilaminotomy and lateral recess decompression L2-3                                      4.  Posterior lateral arthrodesis with autograft bone L1-2                                     5.  Instrumented fusion L1-2 utilizing pedicle screw rod construct.  Subjective: Overall feeling well this morning, no complaints.  Assessment & Plan: Active Problems:   Herniated lumbar intervertebral disc   S/P lumbar fusion   Principal Problem Herniated lumbar intervertebral disc - OR 5/16  Active Problems HTN -can resume home meds tomorrow  DM -+ family history, A1C 6.8 4 years ago and 6.5 now. This is indicative of DM -Discussed with patient at bedside this morning, advised a diet low in refined carbohydrates and refined sugars.  He expressed good understanding and grasp of diabetic diet as he has multiple family members with diabetes.  He was recently on prednisone which could have actually increased his A1c.  Discussed about metformin, he would like to discuss further with Dr. Delfina Redwood.  He will need a repeat A1c in several months  We will sign off, patient does not seem to have active medical problems, please call us back with questions.  Thank you for letting us to be part of his care  Scheduled Meds:  ferrous sulfate  325 mg Oral BID WC   gabapentin  300 mg Oral TID   sodium chloride flush  3 mL Intravenous Q12H   Continuous Infusions:  sodium chloride     lactated ringers  Stopped (06/16/18 0349)   PRN Meds:.acetaminophen **OR** acetaminophen, magnesium hydroxide, menthol-cetylpyridinium **OR** phenol, methocarbamol **OR** [DISCONTINUED] methocarbamol (ROBAXIN) IV, ondansetron **OR** [DISCONTINUED] ondansetron (ZOFRAN) IV, oxyCODONE, oxyCODONE, sodium chloride flush, sodium phosphate  Antimicrobials:  None    Objective: Vitals:   06/16/18 0329 06/16/18 0624 06/16/18 0809 06/16/18 1158  BP: 122/68  102/68 (!) 151/91  Pulse: 99  99 (!) 123  Resp: 20  20   Temp: 100.2 F (37.9 C) 99.3 F (37.4 C) 99.1 F (37.3 C) 98.4 F (36.9 C)  TempSrc: Oral Oral Oral Oral  SpO2: 95%  95% 94%  Weight:        Intake/Output Summary (Last 24 hours) at 06/16/2018 1159 Last data filed at 06/16/2018 1115 Gross per 24 hour  Intake 2960.8 ml  Output 2200 ml  Net 760.8 ml   [employer]  06/15/18 0704  Weight: 100 kg    Examination:  Constitutional: NAD Eyes: No icterus ENMT: Moist mucous membranes  Data Reviewed: I have independently reviewed following labs and imaging studies   CBC: Recent Labs  Lab 06/14/18 1326 06/15/18 1229 06/16/18 0439  WBC 5.7  --  6.8  HGB 14.1 11.9* 10.3*  HCT 42.2 35.0* 30.7*  MCV 86.1  --  87.0  PLT 255  --  413   Basic Metabolic Panel: Recent Labs  Lab 06/14/18 1326 06/15/18 1229 06/16/18 0439  NA 136 130* 132*  K 4.5 >8.5* 4.3  CL 103  --  99  CO2 23  --  25  GLUCOSE 113* 110* 115*  BUN 14  --  14  CREATININE 0.86  --  0.90  CALCIUM 9.3  --  8.5*   GFR: CrCl cannot be calculated (Unknown ideal weight.). Liver Function Tests: No results for input(s): AST, ALT, ALKPHOS, BILITOT, PROT, ALBUMIN in the last 168 hours. No results for input(s): LIPASE, AMYLASE in the last 168 hours. No results for input(s): AMMONIA in the last 168 hours. Coagulation Profile: Recent Labs  Lab 06/14/18 1326  INR 1.1   Cardiac Enzymes: No results for input(s): CKTOTAL, CKMB, CKMBINDEX, TROPONINI in the last 168 hours. BNP  (last 3 results) No results for input(s): PROBNP in the last 8760 hours. HbA1C: Recent Labs    06/15/18 0424  HGBA1C 6.5*   CBG: No results for input(s): GLUCAP in the last 168 hours. Lipid Profile: No results for input(s): CHOL, HDL, LDLCALC, TRIG, CHOLHDL, LDLDIRECT in the last 72 hours. Thyroid Function Tests: No results for input(s): TSH, T4TOTAL, FREET4, T3FREE, THYROIDAB in the last 72 hours. Anemia Panel: No results for input(s): VITAMINB12, FOLATE, FERRITIN, TIBC, IRON, RETICCTPCT in the last 72 hours. Urine analysis:    Component Value Date/Time   COLORURINE YELLOW 06/14/2018 1329   APPEARANCEUR HAZY (A) 06/14/2018 1329   LABSPEC 1.013 06/14/2018 1329   PHURINE 7.0 06/14/2018 1329   GLUCOSEU NEGATIVE 06/14/2018 1329   HGBUR NEGATIVE 06/14/2018 1329   BILIRUBINUR NEGATIVE 06/14/2018 1329   KETONESUR NEGATIVE 06/14/2018 1329   PROTEINUR NEGATIVE 06/14/2018 1329   UROBILINOGEN 1.0 07/30/2014 1444   NITRITE NEGATIVE 06/14/2018 1329   LEUKOCYTESUR NEGATIVE 06/14/2018 1329   Sepsis Labs: Invalid input(s): PROCALCITONIN, LACTICIDVEN  Recent Results (from the past 240 hour(s))  SARS Coronavirus 2 (CEPHEID - Performed in [place] hospital lab), Hosp Order     Status: None   Collection Time: 06/14/18  6:13 PM  Result Value Ref Range Status   SARS Coronavirus 2 NEGATIVE NEGATIVE Final    Comment: (NOTE) If result is NEGATIVE SARS-CoV-2 target nucleic acids are NOT DETECTED. The SARS-CoV-2 RNA is generally detectable in upper and lower  respiratory specimens during the acute phase of infection. The lowest  concentration of SARS-CoV-2 viral copies this assay can detect is 250  copies / mL. A negative result does not preclude SARS-CoV-2 infection  and should not be used as the sole basis for treatment or other  patient management decisions.  A negative result may occur with  improper specimen collection / handling, submission of specimen other  than nasopharyngeal  swab, presence of viral mutation(s) within the  areas targeted by this assay, and inadequate number of viral copies  (<250 copies / mL). A negative result must be combined with clinical  observations, patient history, and epidemiological information. If result is POSITIVE SARS-CoV-2 target nucleic acids are DETECTED. The SARS-CoV-2 RNA is  generally detectable in upper and lower  respiratory specimens dur ing the acute phase of infection.  Positive  results are indicative of active infection with SARS-CoV-2.  Clinical  correlation with patient history and other diagnostic information is  necessary to determine patient infection status.  Positive results do  not rule out bacterial infection or co-infection with other viruses. If result is PRESUMPTIVE POSTIVE SARS-CoV-2 nucleic acids MAY BE PRESENT.   A presumptive positive result was obtained on the submitted specimen  and confirmed on repeat testing.  While 2019 novel coronavirus  (SARS-CoV-2) nucleic acids may be present in the submitted sample  additional confirmatory testing may be necessary for epidemiological  and / or clinical management purposes  to differentiate between  SARS-CoV-2 and other Sarbecovirus currently known to infect humans.  If clinically indicated additional testing with an alternate test  methodology [phone]) is advised. The SARS-CoV-2 RNA is generally  detectable in upper and lower respiratory sp ecimens during the acute  phase of infection. The expected result is Negative. Fact Sheet for Patients:  StrictlyIdeas.no Fact Sheet for Healthcare Providers: BankingDealers.co.za This test is not yet approved or cleared by the [place] FDA and has been authorized for detection and/or diagnosis of SARS-CoV-2 by FDA under an Emergency Use Authorization (EUA).  This EUA will remain in effect (meaning this test can be used) for the duration of the COVID-19 declaration  under Section 564(b)(1) of the Act, 21 U.S.C. section 360bbb-3(b)(1), unless the authorization is terminated or revoked sooner. Performed at [facility], [place] [address], [place], [place] [address]   Surgical PCR screen     Status: None   Collection Time: 06/14/18  6:13 PM  Result Value Ref Range Status   MRSA, PCR NEGATIVE NEGATIVE Final   Staphylococcus aureus NEGATIVE NEGATIVE Final    Comment: (NOTE) The Xpert SA Assay (FDA approved for NASAL specimens in patients 71 years of age and older), is one component of a comprehensive surveillance program. It is not intended to diagnose infection nor to guide or monitor treatment. Performed at [facility], [place] [address], [place], [place] [address]       Radiology Studies: X-ray Chest Pa And Lateral  Result Date: 06/14/2018 CLINICAL DATA:  Preoperative examination. EXAM: CHEST - 2 VIEW COMPARISON:  Chest x-ray dated July 30, 2014. FINDINGS: The heart size and mediastinal contours are within normal limits. Both lungs are clear. The visualized skeletal structures are unremarkable. IMPRESSION: No active cardiopulmonary disease. Electronically Signed   By: Titus Dubin M.D.   On: 06/14/2018 14:41   Dg Lumbar Spine 2-3 Views  Result Date: 06/15/2018 CLINICAL DATA:  L1-L2 posterior decompression EXAM: DG C-ARM 61-120 MIN; LUMBAR SPINE - 2-3 VIEW FLUOROSCOPY TIME:  2 minutes, 13 seconds COMPARISON:  Lumbar spine MRI-06/14/2018 FINDINGS: Four spot cone fluoroscopic images of the lumbar spine are provided for review including dedicated lateral projection of the lumbosacral junction. Provided images demonstrate sequela of L1-L2 paraspinal fusion. Alignment appears anatomic. Expected subcutaneous emphysema about the operative site. No radiopaque foreign body. IMPRESSION: Post L1-L2 paraspinal fusion without evidence of complication. Electronically Signed   By: Sandi Mariscal M.D.   On: 06/15/2018 14:54   Mr Lumbar Spine W Wo  Contrast  Result Date: 06/14/2018 CLINICAL DATA:  Acute low back pain with bilateral leg pain and weakness since lifting a heavy bag in the yard 2 weeks ago. EXAM: MRI LUMBAR SPINE WITHOUT AND WITH CONTRAST TECHNIQUE: Multiplanar and multiecho pulse sequences of  the lumbar spine were obtained without and with intravenous contrast. CONTRAST:  9 mL Gadavist intravenous contrast. COMPARISON:  MR lumbar spine dated July 24, 2014. FINDINGS: Segmentation:  Standard. Alignment:  Unchanged trace retrolisthesis at L5-S1. Vertebrae: No fracture, evidence of discitis, or bone lesion. Unchanged chronic degenerative fatty marrow endplate changes at K3-K9 and L5-S1. Conus medullaris and cauda equina: Conus extends to the L1-L2 level. No intradural enhancement. Paraspinal and other soft tissues: Negative. Disc levels: T11-T12: Negative. T12-L1: Negative disc. Mild bilateral facet arthropathy. No stenosis. L1-L2: New large central and left-sided disc extrusion migrating both superiorly and inferiorly, measuring 2.6 cm in craniocaudal dimension. This results in new severe spinal canal stenosis with posterior displacement of the conus. A small amount of disc material extends into the left neural foramen (series 6, image 9). Unchanged mild bilateral facet arthropathy. L2-L3: Central and left paracentral disc protrusion. Moderate to severe bilateral facet arthropathy. Progressive moderate to severe central spinal canal and lateral recess stenosis. Unchanged mild bilateral neuroforaminal stenosis. L3-L4: Unchanged small circumferential disc osteophyte complex. Unchanged moderate bilateral facet arthropathy. Unchanged borderline mild spinal canal stenosis. Unchanged mild bilateral neuroforaminal stenosis. L4-L5: Unchanged small circumferential disc osteophyte complex and mild bilateral facet arthropathy. Unchanged mild left lateral recess stenosis. Unchanged moderate bilateral neuroforaminal stenosis. No spinal canal stenosis. L5-S1:  Unchanged circumferential disc osteophyte complex and mild bilateral facet arthropathy. Unchanged moderate to severe left and moderate right neuroforaminal stenosis. No spinal canal stenosis. IMPRESSION: 1. 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 2. Progressive moderate to severe spinal canal and lateral recess stenosis at L2-L3. 3. Remaining multilevel lumbar spondylosis as described above is similar to prior study, including moderate to severe left neuroforaminal stenosis at L5-S1. Electronically Signed   By: Titus Dubin M.D.   On: 06/14/2018 16:54   Dg C-arm 1-60 Min  Result Date: 06/15/2018 CLINICAL DATA:  L1-L2 posterior decompression EXAM: DG C-ARM 61-120 MIN; LUMBAR SPINE - 2-3 VIEW FLUOROSCOPY TIME:  2 minutes, 13 seconds COMPARISON:  Lumbar spine MRI-06/14/2018 FINDINGS: Four spot cone fluoroscopic images of the lumbar spine are provided for review including dedicated lateral projection of the lumbosacral junction. Provided images demonstrate sequela of L1-L2 paraspinal fusion. Alignment appears anatomic. Expected subcutaneous emphysema about the operative site. No radiopaque foreign body. IMPRESSION: Post L1-L2 paraspinal fusion without evidence of complication. Electronically Signed   By: Sandi Mariscal M.D.   On: 06/15/2018 14:54    Marzetta Board, MD, PhD [facility]  Contact via  [email or web address]  [place] P: [phone]  F: [phone]

## 2018-06-16 NOTE — Progress Notes (Signed)
    Subjective: Procedure(s) (LRB): POSTERIOR LUMBAR FUSION 1 LEVEL LUMBAR 1 - LUMBAR 2 (N/A) Gill decompression L1-L2/ complete inferior facetectomyof L1. Posterior lateral arthrodesiswith autograft bone L1-L2. (N/A) 1 Day Post-Op  Patient reports pain as 2 on 0-10 scale.  Reports decreased leg pain - no improvement in LE strength reports incisional back pain   Positive void Negative bowel movement Positive flatus Negative chest pain or shortness of breath  Objective: Vital signs in last 24 hours: Temp:  [98.3 F (36.8 C)-100.2 F (37.9 C)] 99.1 F (37.3 C) (05/17 0809) Pulse Rate:  [84-124] 99 (05/17 0809) Resp:  [11-20] 20 (05/17 0809) BP: (102-171)/(68-101) 102/68 (05/17 0809) SpO2:  [95 %-100 %] 95 % (05/17 0809)  Intake/Output from previous day: 05/16 0701 - 05/17 0700 In: 4220.8 [P.O.:480; I.V.:3190.7; IV Piggyback:550.1] Out: 3550 [Urine:2450; Blood:1100]  Labs: Recent Labs    06/14/18 1326 06/15/18 1229 06/16/18 0439  WBC 5.7  --  6.8  RBC 4.90  --  3.53*  HCT 42.2 35.0* 30.7*  PLT 255  --  202   Recent Labs    06/14/18 1326 06/15/18 1229 06/16/18 0439  NA 136 130* 132*  K 4.5 >8.5* 4.3  CL 103  --  99  CO2 23  --  25  BUN 14  --  14  CREATININE 0.86  --  0.90  GLUCOSE 113* 110* 115*  CALCIUM 9.3  --  8.5*   Recent Labs    06/14/18 1326  INR 1.1    Physical Exam: ABD soft Intact pulses distally Incision: dressing C/D/I and no drainage Compartment soft Body mass index is 31.63 kg/m.  R LE: remains globally weak 1/5 (no change from pre-op) L LE: Quad weakness persists.  4/5 EHL/TA/GA strength.  Similar to pre-op   Assessment/Plan: Patient stable  Continue mobilization with physical therapy Continue care  Advance diet  1. PT eval today 2. Iron supplementation due to post-operative anemia 3. Will recheck H/H in the AM 4. Continue to monitor neuro exam  Melina Schools, MD [facility] [phone]

## 2018-06-16 NOTE — Progress Notes (Signed)
Orthopedic Tech Progress Note Patient Details:  Joseph Moyer 04/23/1940 [id number]  Patient ID: [name], male   DOB: [birth year], 78 y.o.   MRN: [id number]   [name] 06/16/2018, 12:42 PMCalled [employer] for LSO brace.

## 2018-06-17 ENCOUNTER — Encounter (HOSPITAL_COMMUNITY): Payer: Self-pay | Admitting: Orthopedic Surgery

## 2018-06-17 ENCOUNTER — Inpatient Hospital Stay (HOSPITAL_COMMUNITY)
Admission: RE | Admit: 2018-06-17 | Discharge: 2018-06-26 | DRG: 560 | Disposition: A | Discharge status: Home Health | Payer: Medicare HMO | Source: Intra-hospital | Attending: Physical Medicine & Rehabilitation | Admitting: Physical Medicine & Rehabilitation

## 2018-06-17 ENCOUNTER — Other Ambulatory Visit: Payer: Self-pay

## 2018-06-17 DIAGNOSIS — I10 Essential (primary) hypertension: Secondary | ICD-10-CM | POA: Diagnosis present

## 2018-06-17 DIAGNOSIS — M5106 Intervertebral disc disorders with myelopathy, lumbar region: Secondary | ICD-10-CM | POA: Diagnosis present

## 2018-06-17 DIAGNOSIS — Y92239 Unspecified place in hospital as the place of occurrence of the external cause: Secondary | ICD-10-CM | POA: Diagnosis not present

## 2018-06-17 DIAGNOSIS — G834 Cauda equina syndrome: Secondary | ICD-10-CM | POA: Diagnosis not present

## 2018-06-17 DIAGNOSIS — Z4789 Encounter for other orthopedic aftercare: Principal | ICD-10-CM

## 2018-06-17 DIAGNOSIS — L299 Pruritus, unspecified: Secondary | ICD-10-CM | POA: Diagnosis not present

## 2018-06-17 DIAGNOSIS — Z833 Family history of diabetes mellitus: Secondary | ICD-10-CM | POA: Diagnosis not present

## 2018-06-17 DIAGNOSIS — K5900 Constipation, unspecified: Secondary | ICD-10-CM | POA: Diagnosis present

## 2018-06-17 DIAGNOSIS — G9581 Conus medullaris syndrome: Secondary | ICD-10-CM | POA: Diagnosis not present

## 2018-06-17 DIAGNOSIS — D62 Acute posthemorrhagic anemia: Secondary | ICD-10-CM | POA: Diagnosis present

## 2018-06-17 DIAGNOSIS — M5416 Radiculopathy, lumbar region: Secondary | ICD-10-CM | POA: Diagnosis present

## 2018-06-17 DIAGNOSIS — M48061 Spinal stenosis, lumbar region without neurogenic claudication: Secondary | ICD-10-CM | POA: Diagnosis present

## 2018-06-17 DIAGNOSIS — N400 Enlarged prostate without lower urinary tract symptoms: Secondary | ICD-10-CM | POA: Diagnosis present

## 2018-06-17 DIAGNOSIS — Z79899 Other long term (current) drug therapy: Secondary | ICD-10-CM

## 2018-06-17 DIAGNOSIS — T402X5A Adverse effect of other opioids, initial encounter: Secondary | ICD-10-CM | POA: Diagnosis not present

## 2018-06-17 DIAGNOSIS — G959 Disease of spinal cord, unspecified: Secondary | ICD-10-CM | POA: Diagnosis present

## 2018-06-17 DIAGNOSIS — E785 Hyperlipidemia, unspecified: Secondary | ICD-10-CM | POA: Diagnosis present

## 2018-06-17 DIAGNOSIS — Z791 Long term (current) use of non-steroidal anti-inflammatories (NSAID): Secondary | ICD-10-CM

## 2018-06-17 DIAGNOSIS — K592 Neurogenic bowel, not elsewhere classified: Secondary | ICD-10-CM | POA: Diagnosis present

## 2018-06-17 DIAGNOSIS — Z87891 Personal history of nicotine dependence: Secondary | ICD-10-CM

## 2018-06-17 DIAGNOSIS — Z7982 Long term (current) use of aspirin: Secondary | ICD-10-CM | POA: Diagnosis not present

## 2018-06-17 DIAGNOSIS — M7989 Other specified soft tissue disorders: Secondary | ICD-10-CM | POA: Diagnosis not present

## 2018-06-17 LAB — POCT I-STAT 4, (NA,K, GLUC, HGB,HCT)
Glucose, Bld: 110 mg/dL — ABNORMAL HIGH (ref 70–99)
HCT: 35 % — ABNORMAL LOW (ref 39.0–52.0)
Hemoglobin: 11.9 g/dL — ABNORMAL LOW (ref 13.0–17.0)
Potassium: >8.5 mmol/L (ref 3.5–5.1)
Sodium: 130 mmol/L — ABNORMAL LOW (ref 135–145)

## 2018-06-17 MED ORDER — SORBITOL 70 % SOLN
30.0000 mL | Freq: Every day | Status: DC | PRN
  Administered 2018-06-23: 30 mL via ORAL
  Filled 2018-06-17: qty 30

## 2018-06-17 MED ORDER — GABAPENTIN 300 MG PO CAPS
300.0000 mg | ORAL_CAPSULE | Freq: Three times a day (TID) | ORAL | Status: DC
  Administered 2018-06-17 – 2018-06-26 (×26): 300 mg via ORAL
  Filled 2018-06-17 (×26): qty 1

## 2018-06-17 MED ORDER — TAMSULOSIN HCL 0.4 MG PO CAPS
0.4000 mg | ORAL_CAPSULE | Freq: Every day | ORAL | Status: DC
  Administered 2018-06-17: 0.4 mg via ORAL
  Filled 2018-06-17: qty 1

## 2018-06-17 MED ORDER — ENOXAPARIN SODIUM 30 MG/0.3ML ~~LOC~~ SOLN
30.0000 mg | SUBCUTANEOUS | Status: DC
  Administered 2018-06-18 – 2018-06-25 (×8): 30 mg via SUBCUTANEOUS
  Filled 2018-06-17 (×8): qty 0.3

## 2018-06-17 MED ORDER — FERROUS SULFATE 325 (65 FE) MG PO TABS
325.0000 mg | ORAL_TABLET | Freq: Two times a day (BID) | ORAL | Status: DC
  Administered 2018-06-18 – 2018-06-26 (×17): 325 mg via ORAL
  Filled 2018-06-17 (×17): qty 1

## 2018-06-17 MED ORDER — SIMVASTATIN 20 MG PO TABS
20.0000 mg | ORAL_TABLET | Freq: Every day | ORAL | Status: DC
  Administered 2018-06-17: 20 mg via ORAL
  Filled 2018-06-17: qty 1

## 2018-06-17 MED ORDER — ACETAMINOPHEN 325 MG PO TABS
650.0000 mg | ORAL_TABLET | ORAL | Status: DC | PRN
  Administered 2018-06-22 – 2018-06-24 (×3): 650 mg via ORAL
  Filled 2018-06-17 (×3): qty 2

## 2018-06-17 MED ORDER — METHOCARBAMOL 500 MG PO TABS
500.0000 mg | ORAL_TABLET | Freq: Four times a day (QID) | ORAL | Status: DC | PRN
  Administered 2018-06-17 – 2018-06-26 (×12): 500 mg via ORAL
  Filled 2018-06-17 (×15): qty 1

## 2018-06-17 MED ORDER — ENOXAPARIN SODIUM 30 MG/0.3ML ~~LOC~~ SOLN
30.0000 mg | SUBCUTANEOUS | Status: DC
  Administered 2018-06-17: 30 mg via SUBCUTANEOUS
  Filled 2018-06-17: qty 0.3

## 2018-06-17 MED ORDER — MAGNESIUM CITRATE PO SOLN
0.5000 | Freq: Once | ORAL | Status: AC
  Administered 2018-06-17: 0.5 via ORAL
  Filled 2018-06-17: qty 296

## 2018-06-17 MED ORDER — ENOXAPARIN SODIUM 30 MG/0.3ML ~~LOC~~ SOLN: 30.0000 mg | SUBCUTANEOUS | Status: DC

## 2018-06-17 MED ORDER — AMLODIPINE BESYLATE 5 MG PO TABS
5.0000 mg | ORAL_TABLET | Freq: Every day | ORAL | Status: DC
  Administered 2018-06-17: 5 mg via ORAL
  Filled 2018-06-17: qty 1

## 2018-06-17 MED ORDER — OXYCODONE HCL 5 MG PO TABS
10.0000 mg | ORAL_TABLET | ORAL | Status: DC | PRN
  Administered 2018-06-18 – 2018-06-25 (×24): 10 mg via ORAL
  Filled 2018-06-17 (×24): qty 2

## 2018-06-17 MED ORDER — OXYCODONE HCL 5 MG PO TABS
5.0000 mg | ORAL_TABLET | ORAL | Status: DC | PRN
  Administered 2018-06-17 – 2018-06-24 (×5): 5 mg via ORAL
  Filled 2018-06-17 (×5): qty 1

## 2018-06-17 MED ORDER — ACETAMINOPHEN 650 MG RE SUPP: 650.0000 mg | RECTAL | Status: DC | PRN

## 2018-06-17 MED FILL — Thrombin (Recombinant) For Soln 20000 Unit: CUTANEOUS | Qty: 1 | Status: AC

## 2018-06-17 NOTE — Progress Notes (Signed)
Orthopedic Tech Progress Note Patient Details:  Joseph Moyer 24-Mar-1940 [id number]  Ortho Devices Type of Ortho Device: Knee Immobilizer Ortho Device/Splint Interventions: Adjustment, Application, Ordered   Post Interventions Patient Tolerated: Well Instructions Provided: Poper ambulation with device, Care of device, Adjustment of device   Melony Overly T 06/17/2018, 11:54 AM

## 2018-06-17 NOTE — Progress Notes (Deleted)
Physical Therapy Treatment Patient Details Name: Joseph Moyer MRN: [id number] DOB: [birth year] Today's Date: 06/17/2018    History of Present Illness 78 year old male with history of hypertension, hyperlipidemia presents s/p posterior lumbar fusion L1-L2.    PT Comments    Patient seen for activity progression. Patient extremely motivated to progress. Session focused on gait retraining with heavy reliance on RW. Multi modal facilitation techniques required for initiation of RLE stepstride and weight shifting. Some improved activation noted in RLE but remains significantly limited overall. Current recommendations remain appropriate.  Follow Up Recommendations  CIR;Supervision/Assistance - 24 hour     Equipment Recommendations  3in1 (PT);Rolling walker with 5" wheels    Recommendations for Other Services Rehab consult     Precautions / Restrictions Precautions Precautions: Fall;Back Precaution Booklet Issued: Yes (comment) Precaution Comments: verbally reviewed with patient Required Braces or Orthoses: Spinal Brace Spinal Brace: Lumbar corset Restrictions Weight Bearing Restrictions: No    Mobility  Bed Mobility               General bed mobility comments: received up in chair  Transfers Overall transfer level: Needs assistance Equipment used: Rolling walker (2 wheeled) Transfers: Sit to/from Stand Sit to Stand: Mod assist         General transfer comment: Continues to require increased assist to power up to standing with modified hand placement on RW.  Ambulation/Gait Ambulation/Gait assistance: Min assist Gait Distance (Feet): 36 Feet Assistive device: Rolling walker (2 wheeled) Gait Pattern/deviations: Step-to pattern;Decreased stride length;Decreased stance time - right;Decreased weight shift to left;Decreased dorsiflexion - right;Shuffle Gait velocity: decreased   General Gait Details: patient continues to require facilitation and manual assist for  RLE movement with progression to some activation and stride with increased distance. VCs for upright posture and sequencing throughout. heavy relaince on RW for UE support.   Stairs             Wheelchair Mobility    Modified Rankin (Stroke Patients Only)       Balance Overall balance assessment: Needs assistance Sitting-balance support: Feet supported Sitting balance-Leahy Scale: Fair     Standing balance support: Bilateral upper extremity supported;During functional activity Standing balance-Leahy Scale: Poor Standing balance comment: heavy reliance on external support and assist                            Cognition Arousal/Alertness: Awake/alert Behavior During Therapy: WFL for tasks assessed/performed Overall Cognitive Status: Within Functional Limits for tasks assessed                                        Exercises      General Comments        Pertinent Vitals/Pain Pain Assessment: Faces Faces Pain Scale: Hurts even more Pain Location: low back and operative site Pain Descriptors / Indicators: Grimacing;Guarding;Operative site guarding;Sore Pain Intervention(s): Monitored during session    Home Living                      Prior Function            PT Goals (current goals can now be found in the care plan section) Acute Rehab PT Goals Patient Stated Goal: to be able to walk PT Goal Formulation: With patient Time For Goal Achievement: 06/30/18 Potential to Achieve Goals: Good Progress towards PT goals:  Progressing toward goals    Frequency    Min 5X/week      PT Plan Current plan remains appropriate    Co-evaluation              AM-PAC PT "6 Clicks" Mobility   Outcome Measure  Help needed turning from your back to your side while in a flat bed without using bedrails?: A Little Help needed moving from lying on your back to sitting on the side of a flat bed without using bedrails?: A  Little Help needed moving to and from a bed to a chair (including a wheelchair)?: A Lot Help needed standing up from a chair using your arms (e.g., wheelchair or bedside chair)?: A Lot Help needed to walk in hospital room?: A Lot Help needed climbing 3-5 steps with a railing? : A Lot 6 Click Score: 14    End of Session Equipment Utilized During Treatment: Gait belt;Back brace Activity Tolerance: Patient tolerated treatment well Patient left: in chair;with call bell/phone within reach;with chair alarm set Nurse Communication: Mobility status PT Visit Diagnosis: Muscle weakness (generalized) (M62.81);Difficulty in walking, not elsewhere classified (R26.2);Other symptoms and signs involving the nervous system (V81.840)     Time: 3754-3606 PT Time Calculation (min) (ACUTE ONLY): 21 min  Charges:  $Gait Training: 8-22 mins                     Alben Deeds, PT DPT  Board Certified Neurologic Specialist [place] Pager [phone] Office [phone]    Cleatrice Burke 06/17/2018, 10:09 AM

## 2018-06-17 NOTE — H&P (Signed)
Physical Medicine and Rehabilitation Admission H&P     Chief complaint: Back pain HPI: Joseph Moyer is a 78 year old right-handed male with history of hypertension hyperlipidemia.  Per chart review patient lives with spouse.  Independent prior to admission.  Multilevel home.  Presented 06/14/2018 with increasing low back pain radiating to the lower extremities right greater than left times several weeks after lifting a bag of fertilizer.  He denied any bowel or bladder disturbances.  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  Left lateral recess stenosis L2-3.  Underwent bilateral L1-2 discectomy with left hemilaminotomy and lateral recess decompression L2-3 06/15/2018 per Dr. Rolena Infante.  Hospital course pain management.  Lumbar corset when out of bed.  Subcutaneous Lovenox for DVT prophylaxis.  Acute blood loss anemia 10.3.  Therapy evaluations completed and patient was admitted for a comprehensive rehab program   Review of Systems  Constitutional: Negative for chills and fever.  HENT: Negative for hearing loss.   Eyes: Negative for blurred vision and double vision.  Respiratory: Negative for cough.   Cardiovascular: Positive for leg swelling. Negative for chest pain and palpitations.  Gastrointestinal: Positive for constipation. Negative for heartburn, nausea and vomiting.  Genitourinary: Positive for urgency. Negative for dysuria, flank pain and hematuria.  Musculoskeletal: Positive for back pain and myalgias.  Skin: Negative for rash.  Neurological: Negative for seizures.  All other systems reviewed and are negative.       Past Medical History:  Diagnosis Date  . Allergy    . Arthritis    . Hyperlipidemia    . Hypertension           Past Surgical History:  Procedure Laterality Date  . CIRCUMCISION      . COLONOSCOPY WITH PROPOFOL N/A 05/10/2015    Procedure: COLONOSCOPY WITH PROPOFOL;  Surgeon: Garlan Fair, MD;  Location: [facility];  Service: Endoscopy;  Laterality: N/A;  . HEMORRHOID SURGERY             Family History  Problem Relation Age of Onset  . Diabetes Mother    . Diabetes Sister    . Diabetes Brother      Social History:  reports that he quit smoking about 26 years ago. He has never used smokeless tobacco. He reports current alcohol use. He reports that he does not use drugs. Allergies: No Known Allergies       Medications Prior to Admission  Medication Sig Dispense Refill  . amLODipine (NORVASC) 5 MG tablet Take 5 mg by mouth daily.      Marland Kitchen gabapentin (NEURONTIN) 300 MG capsule Take 300 mg by mouth 3 (three) times daily.      Marland Kitchen HYDROcodone-acetaminophen (NORCO/VICODIN) 5-325 MG tablet Take 1 tablet by mouth See admin instructions. Take 1 tablet by mouth every 4-6 hours as needed for pain      . Menthol, Topical Analgesic, (BIOFREEZE) 4 % GEL Apply 1 application topically as needed (to painful sites).      . methocarbamol (ROBAXIN) 500 MG tablet Take 500 mg by mouth 3 (three) times daily.      . simvastatin (ZOCOR) 20 MG tablet Take 20 mg by mouth every evening.       . tamsulosin (FLOMAX) 0.4 MG CAPS capsule Take 0.4 mg by mouth daily.      Marland Kitchen aspirin 81 MG tablet Take 81 mg by mouth every morning.       . meloxicam (  MOBIC) 15 MG tablet Take 15 mg by mouth daily.          Drug Regimen Review Drug regimen was reviewed and remains appropriate with no significant issues identified   Home: Home Living Family/patient expects to be discharged to:: Private residence Living Arrangements: Spouse/significant other Available Help at Discharge: Family Type of Home: House Home Access: (threshold) Home Layout: Multi-level Bathroom Shower/Tub: Chiropodist: Standard Home Equipment: Wheelchair - manual, Bedside commode   Functional History: Prior Function Level of Independence: Independent   Functional Status:  Mobility: Bed Mobility General bed  mobility comments: received up in chair Transfers Overall transfer level: Needs assistance Equipment used: Rolling walker (2 wheeled) Transfers: Sit to/from Stand Sit to Stand: Mod assist General transfer comment: Continues to require increased assist to power up to standing with modified hand placement on RW. Ambulation/Gait Ambulation/Gait assistance: Min assist Gait Distance (Feet): 36 Feet Assistive device: Rolling walker (2 wheeled) Gait Pattern/deviations: Step-to pattern, Decreased stride length, Decreased stance time - right, Decreased weight shift to left, Decreased dorsiflexion - right, Shuffle General Gait Details: patient continues to require facilitation and manual assist for RLE movement with progression to some activation and stride with increased distance. VCs for upright posture and sequencing throughout. heavy relaince on RW for UE support. Gait velocity: decreased   ADL: ADL Overall ADL's : Needs assistance/impaired Grooming: Set up, Sitting Upper Body Bathing: Set up, Sitting Lower Body Bathing: Sit to/from stand, Maximal assistance Lower Body Bathing Details (indicate cue type and reason): unable to complete figure 4 technique, mod assist sit<>stand  Upper Body Dressing : Set up, Sitting Lower Body Dressing: Maximal assistance, Sit to/from stand Lower Body Dressing Details (indicate cue type and reason): unable to complete figure 4 technique, mod assist sit<>stand  Toilet Transfer: Moderate assistance, RW Toilet Transfer Details (indicate cue type and reason): simulated sit<>stand from [place] and Hygiene: Maximal assistance, Sit to/from stand Functional mobility during ADLs: Moderate assistance, Rolling walker General ADL Comments: limited by pain, decreased functional use of R LE, impaired balance    Cognition: Cognition Overall Cognitive Status: Within Functional Limits for tasks assessed Orientation Level: Oriented X4  Cognition Arousal/Alertness: Awake/alert Behavior During Therapy: WFL for tasks assessed/performed Overall Cognitive Status: Within Functional Limits for tasks assessed   Physical Exam: Blood pressure (!) 149/78, pulse (!) 115, temperature 98.4 F (36.9 C), temperature source Oral, resp. rate 20, weight 100 kg, SpO2 95 %. Physical Exam  Constitutional: He is oriented to person, place, and time. He appears well-developed and well-nourished. No distress.  HENT:  Head: Normocephalic and atraumatic.  Eyes: Pupils are equal, round, and reactive to light. EOM are normal.  Neck: Normal range of motion. No thyromegaly present.  Cardiovascular: Normal rate and regular rhythm. Exam reveals no friction rub.  No murmur heard. Respiratory: Effort normal. No respiratory distress. He has no wheezes. He has no rales.  GI: Soft. Bowel sounds are normal. He exhibits no distension. There is no abdominal tenderness.  Musculoskeletal:        General: No edema.     Comments: LB TTP, in LSO.   Neurological: He is alert and oriented to person, place, and time.  Patient is alert in no acute distress.  Follows full commands. UE 5/5. RLE: HF 2-, KE 1+, ADF 3, APF 4/5, KF 4/5, HE 4/5. LLE: 4- to 4/5. Decreased sensation over primarily over right L2, L3 dermatomes. DTR's absent in LE's.   Skin: Skin is warm.  He is not diaphoretic.  Back incision clean and dry  Psychiatric: He has a normal mood and affect. His behavior is normal.      Lab Results Last 48 Hours        Results for orders placed or performed during the hospital encounter of 06/14/18 (from the past 48 hour(s))  Basic Metabolic Panel     Status: Abnormal    Collection Time: 06/16/18  4:39 AM  Result Value Ref Range    Sodium 132 (L) 135 - 145 mmol/L    Potassium 4.3 3.5 - 5.1 mmol/L      Comment: DELTA CHECK NOTED    Chloride 99 98 - 111 mmol/L    CO2 25 22 - 32 mmol/L    Glucose, Bld 115 (H) 70 - 99 mg/dL    BUN 14 8 - 23 mg/dL     Creatinine, Ser 0.90 0.61 - 1.24 mg/dL    Calcium 8.5 (L) 8.9 - 10.3 mg/dL    GFR calc non Af Amer >60 >60 mL/min    GFR calc Af Amer >60 >60 mL/min    Anion gap 8 5 - 15      Comment: Performed at [facility], [address] [place], [place] [address]  CBC     Status: Abnormal    Collection Time: 06/16/18  4:39 AM  Result Value Ref Range    WBC 6.8 4.0 - 10.5 K/uL    RBC 3.53 (L) 4.22 - 5.81 MIL/uL    Hemoglobin 10.3 (L) 13.0 - 17.0 g/dL      Comment: REPEATED TO VERIFY DELTA CHECK NOTED      HCT 30.7 (L) 39.0 - 52.0 %    MCV 87.0 80.0 - 100.0 fL    MCH 29.2 26.0 - 34.0 pg    MCHC 33.6 30.0 - 36.0 g/dL    RDW 14.4 11.5 - 15.5 %    Platelets 202 150 - 400 K/uL    nRBC 0.0 0.0 - 0.2 %      Comment: Performed at [facility], [place] [address], [place], [place] [address]      Imaging Results (Last 48 hours)  No results found.           Medical Problem List and Plan: 1.  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  Status post L1-2 bilateral discectomy with left hemilaminotomy 06/15/2018.  Lumbar corset when out of bed             -admit to inpatient rehab 2.  Antithrombotics: -DVT/anticoagulation: Subcutaneous Lovenox.  Check vascular study             -antiplatelet therapy: N/A 3. Pain Management: Neurontin 300 mg 3 times daily, Robaxin and oxycodone as needed 4. Mood: Provide emotional support             -antipsychotic agents: N/A 5. Neuropsych: This patient is capable of making decisions on his own behalf. 6. Skin/Wound Care: Routine skin checks 7. Fluids/Electrolytes/Nutrition: Routine in and outs with follow-up chemistries 8.  Acute blood loss anemia.  Continue iron supplement 9.  Hypertension.  Patient on Norvasc 5 mg daily prior to admission.  Resume as needed 10.  BPH.  Resume Flomax 0.4 mg daily.             -pt states he's been voiding  without significant difficulty 11.  Hyperlipidemia.  Resume Zocor 12.  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 Functional deficits secondary  to lumbar stenosis with cauda equina. 2. Patient is admitted to receive collaborative, interdisciplinary care between the physiatrist, rehab nursing staff, and therapy team. 3. Patient's level of medical complexity and substantial therapy needs in context of that medical necessity cannot be provided at a lesser intensity of care such as a SNF. 4. Patient has experienced substantial functional loss from his/her baseline which was documented above under the "Functional History" and "Functional Status" headings.  Judging by the patient's diagnosis, physical exam, and functional history, the patient has potential for functional progress which will result in measurable gains while on inpatient rehab.  These gains will be of substantial and practical use upon discharge  in facilitating mobility and self-care at the household level. 5. Physiatrist will provide 24 hour management of medical needs as well as oversight of the therapy plan/treatment and provide guidance as appropriate regarding the interaction of the two. 6. The Preadmission Screening has been reviewed and patient status is unchanged unless otherwise stated above. 7. 24 hour rehab nursing will assist with bladder management, bowel management, safety, skin/wound care, disease management, medication administration, pain management and patient education  and help integrate therapy concepts, techniques,education, etc. 8. PT will assess and treat for/with: Lower extremity strength, range of motion, stamina, balance, functional mobility, safety, adaptive techniques and equipment .   Goals are: mod I. 9. OT will assess and treat for/with: ADL's, functional mobility, safety, upper extremity strength, adaptive techniques and equipment .   Goals are: mod I.  Therapy may proceed with showering this patient. 10. SLP will assess and treat for/with: n/a.  Goals are: n/a. 11. Case Management and Social Worker will assess and treat for psychological issues and discharge planning. 12. Team conference will be held weekly to assess progress toward goals and to determine barriers to discharge. 13. Patient will receive at least 3 hours of therapy per day at least 5 days per week. 14. ELOS: 8-12 days       15. Prognosis:  excellent   I have personally performed a face to face diagnostic evaluation of this patient and formulated the key components of the plan.  Additionally, I have personally reviewed laboratory data, imaging studies, as well as relevant notes and concur with the physician assistant's documentation above.  Meredith Staggers, MD, FAAPMR       Lavon Paganini [place], PA-C 06/17/2018

## 2018-06-17 NOTE — Care Management Important Message (Signed)
Important Message  Patient Details  Name: Joseph Moyer MRN: [id number] Date of Birth: 05-Aug-1940   Medicare Important Message Given:  Yes    Orbie Pyo 06/17/2018, 4:08 PM

## 2018-06-17 NOTE — H&P (Signed)
Physical Medicine and Rehabilitation Admission H&P    Chief complaint: Back pain HPI: Joseph Moyer is a 78 year old right-handed male with history of hypertension hyperlipidemia.  Per chart review patient lives with spouse.  Independent prior to admission.  Multilevel home.  Presented 06/14/2018 with increasing low back pain radiating to the lower extremities right greater than left times several weeks after lifting a bag of fertilizer.  He denied any bowel or bladder disturbances.  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  Left lateral recess stenosis L2-3.  Underwent bilateral L1-2 discectomy with left hemilaminotomy and lateral recess decompression L2-3 06/15/2018 per Dr. Rolena Infante.  Hospital course pain management.  Lumbar corset when out of bed.  Subcutaneous Lovenox for DVT prophylaxis.  Acute blood loss anemia 10.3.  Therapy evaluations completed and patient was admitted for a comprehensive rehab program  Review of Systems  Constitutional: Negative for chills and fever.  HENT: Negative for hearing loss.   Eyes: Negative for blurred vision and double vision.  Respiratory: Negative for cough.   Cardiovascular: Positive for leg swelling. Negative for chest pain and palpitations.  Gastrointestinal: Positive for constipation. Negative for heartburn, nausea and vomiting.  Genitourinary: Positive for urgency. Negative for dysuria, flank pain and hematuria.  Musculoskeletal: Positive for back pain and myalgias.  Skin: Negative for rash.  Neurological: Negative for seizures.  All other systems reviewed and are negative.  Past Medical History:  Diagnosis Date   Allergy    Arthritis    Hyperlipidemia    Hypertension    Past Surgical History:  Procedure Laterality Date   CIRCUMCISION     COLONOSCOPY WITH PROPOFOL N/A 05/10/2015   Procedure: COLONOSCOPY WITH PROPOFOL;  Surgeon: Garlan Fair, MD;   Location: [facility];  Service: Endoscopy;  Laterality: N/A;   HEMORRHOID SURGERY     Family History  Problem Relation Age of Onset   Diabetes Mother    Diabetes Sister    Diabetes Brother    Social History:  reports that he quit smoking about 26 years ago. He has never used smokeless tobacco. He reports current alcohol use. He reports that he does not use drugs. Allergies: No Known Allergies Medications Prior to Admission  Medication Sig Dispense Refill   amLODipine (NORVASC) 5 MG tablet Take 5 mg by mouth daily.     gabapentin (NEURONTIN) 300 MG capsule Take 300 mg by mouth 3 (three) times daily.     HYDROcodone-acetaminophen (NORCO/VICODIN) 5-325 MG tablet Take 1 tablet by mouth See admin instructions. Take 1 tablet by mouth every 4-6 hours as needed for pain     Menthol, Topical Analgesic, (BIOFREEZE) 4 % GEL Apply 1 application topically as needed (to painful sites).     methocarbamol (ROBAXIN) 500 MG tablet Take 500 mg by mouth 3 (three) times daily.     simvastatin (ZOCOR) 20 MG tablet Take 20 mg by mouth every evening.      tamsulosin (FLOMAX) 0.4 MG CAPS capsule Take 0.4 mg by mouth daily.     aspirin 81 MG tablet Take 81 mg by mouth every morning.      meloxicam (MOBIC) 15 MG tablet Take 15 mg by mouth daily.      Drug Regimen Review Drug regimen was reviewed and remains appropriate with no significant issues identified  Home: Home Living Family/patient expects to be discharged to:: Private residence Living Arrangements: Spouse/significant other Available Help at Discharge: Family Type of Home: [place]  Access: (threshold) Home Layout: Multi-level Bathroom Shower/Tub: Chiropodist: Standard Home Equipment: Wheelchair - manual, Bedside commode   Functional History: Prior Function Level of Independence: Independent  Functional Status:  Mobility: Bed Mobility General bed mobility comments: received up in  chair Transfers Overall transfer level: Needs assistance Equipment used: Rolling walker (2 wheeled) Transfers: Sit to/from Stand Sit to Stand: Mod assist General transfer comment: Continues to require increased assist to power up to standing with modified hand placement on RW. Ambulation/Gait Ambulation/Gait assistance: Min assist Gait Distance (Feet): 36 Feet Assistive device: Rolling walker (2 wheeled) Gait Pattern/deviations: Step-to pattern, Decreased stride length, Decreased stance time - right, Decreased weight shift to left, Decreased dorsiflexion - right, Shuffle General Gait Details: patient continues to require facilitation and manual assist for RLE movement with progression to some activation and stride with increased distance. VCs for upright posture and sequencing throughout. heavy relaince on RW for UE support. Gait velocity: decreased    ADL: ADL Overall ADL's : Needs assistance/impaired Grooming: Set up, Sitting Upper Body Bathing: Set up, Sitting Lower Body Bathing: Sit to/from stand, Maximal assistance Lower Body Bathing Details (indicate cue type and reason): unable to complete figure 4 technique, mod assist sit<>stand  Upper Body Dressing : Set up, Sitting Lower Body Dressing: Maximal assistance, Sit to/from stand Lower Body Dressing Details (indicate cue type and reason): unable to complete figure 4 technique, mod assist sit<>stand  Toilet Transfer: Moderate assistance, RW Toilet Transfer Details (indicate cue type and reason): simulated sit<>stand from [place] and Hygiene: Maximal assistance, Sit to/from stand Functional mobility during ADLs: Moderate assistance, Rolling walker General ADL Comments: limited by pain, decreased functional use of R LE, impaired balance   Cognition: Cognition Overall Cognitive Status: Within Functional Limits for tasks assessed Orientation Level: Oriented X4 Cognition Arousal/Alertness:  Awake/alert Behavior During Therapy: WFL for tasks assessed/performed Overall Cognitive Status: Within Functional Limits for tasks assessed  Physical Exam: Blood pressure (!) 149/78, pulse (!) 115, temperature 98.4 F (36.9 C), temperature source Oral, resp. rate 20, weight 100 kg, SpO2 95 %. Physical Exam  Constitutional: He is oriented to person, place, and time. He appears well-developed and well-nourished. No distress.  HENT:  Head: Normocephalic and atraumatic.  Eyes: Pupils are equal, round, and reactive to light. EOM are normal.  Neck: Normal range of motion. No thyromegaly present.  Cardiovascular: Normal rate and regular rhythm. Exam reveals no friction rub.  No murmur heard. Respiratory: Effort normal. No respiratory distress. He has no wheezes. He has no rales.  GI: Soft. Bowel sounds are normal. He exhibits no distension. There is no abdominal tenderness.  Musculoskeletal:        General: No edema.     Comments: LB TTP, in LSO.   Neurological: He is alert and oriented to person, place, and time.  Patient is alert in no acute distress.  Follows full commands. UE 5/5. RLE: HF 2-, KE 1+, ADF 3, APF 4/5, KF 4/5, HE 4/5. LLE: 4- to 4/5. Decreased sensation over primarily over right L2, L3 dermatomes. DTR's absent in LE's.   Skin: Skin is warm. He is not diaphoretic.  Back incision clean and dry  Psychiatric: He has a normal mood and affect. His behavior is normal.    Results for orders placed or performed during the hospital encounter of 06/14/18 (from the past 48 hour(s))  Basic Metabolic Panel     Status: Abnormal   Collection Time: 06/16/18  4:39 AM  Result  Value Ref Range   Sodium 132 (L) 135 - 145 mmol/L   Potassium 4.3 3.5 - 5.1 mmol/L    Comment: DELTA CHECK NOTED   Chloride 99 98 - 111 mmol/L   CO2 25 22 - 32 mmol/L   Glucose, Bld 115 (H) 70 - 99 mg/dL   BUN 14 8 - 23 mg/dL   Creatinine, Ser 0.90 0.61 - 1.24 mg/dL   Calcium 8.5 (L) 8.9 - 10.3 mg/dL   GFR calc  non Af Amer >60 >60 mL/min   GFR calc Af Amer >60 >60 mL/min   Anion gap 8 5 - 15    Comment: Performed at [facility] [address] [place], [place] [address]  CBC     Status: Abnormal   Collection Time: 06/16/18  4:39 AM  Result Value Ref Range   WBC 6.8 4.0 - 10.5 K/uL   RBC 3.53 (L) 4.22 - 5.81 MIL/uL   Hemoglobin 10.3 (L) 13.0 - 17.0 g/dL    Comment: REPEATED TO VERIFY DELTA CHECK NOTED    HCT 30.7 (L) 39.0 - 52.0 %   MCV 87.0 80.0 - 100.0 fL   MCH 29.2 26.0 - 34.0 pg   MCHC 33.6 30.0 - 36.0 g/dL   RDW 14.4 11.5 - 15.5 %   Platelets 202 150 - 400 K/uL   nRBC 0.0 0.0 - 0.2 %    Comment: Performed at [facility], [place] [address] [place], [place] [address]   No results found.     Medical Problem List and Plan: 1.  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  Status post L1-2 bilateral discectomy with left hemilaminotomy 06/15/2018.  Lumbar corset when out of bed  -admit to inpatient rehab 2.  Antithrombotics: -DVT/anticoagulation: Subcutaneous Lovenox.  Check vascular study  -antiplatelet therapy: N/A 3. Pain Management: Neurontin 300 mg 3 times daily, Robaxin and oxycodone as needed 4. Mood: Provide emotional support  -antipsychotic agents: N/A 5. Neuropsych: This patient is capable of making decisions on his own behalf. 6. Skin/Wound Care: Routine skin checks 7. Fluids/Electrolytes/Nutrition: Routine in and outs with follow-up chemistries 8.  Acute blood loss anemia.  Continue iron supplement 9.  Hypertension.  Patient on Norvasc 5 mg daily prior to admission.  Resume as needed 10.  BPH.  Resume Flomax 0.4 mg daily.  -pt states he's been voiding without significant difficulty 11.  Hyperlipidemia.  Resume Zocor 12. 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

## 2018-06-17 NOTE — Progress Notes (Signed)
    Subjective: Procedure(s) (LRB): POSTERIOR LUMBAR FUSION 1 LEVEL LUMBAR 1 - LUMBAR 2 (N/A) Gill decompression L1-L2/ complete inferior facetectomyof L1. Posterior lateral arthrodesiswith autograft bone L1-L2. (N/A) 2 Days Post-Op  Patient reports pain as 2 on 0-10 scale.  Reports none leg pain reports incisional back pain   Positive void Negative bowel movement Positive flatus Negative chest pain or shortness of breath  Objective: Vital signs in last 24 hours: Temp:  [98.4 F (36.9 C)-100.2 F (37.9 C)] 99.3 F (37.4 C) (05/18 0823) Pulse Rate:  [106-123] 109 (05/18 0823) Resp:  [16-20] 20 (05/18 0823) BP: (134-151)/(69-91) 139/78 (05/18 0823) SpO2:  [94 %-96 %] 95 % (05/18 0823)  Intake/Output from previous day: 05/17 0701 - 05/18 0700 In: 843 [P.O.:840; I.V.:3] Out: 1750 [Urine:1750]  Labs: Recent Labs    06/14/18 1326 06/15/18 1229 06/16/18 0439  WBC 5.7  --  6.8  RBC 4.90  --  3.53*  HCT 42.2 35.0* 30.7*  PLT 255  --  202   Recent Labs    06/14/18 1326 06/15/18 1229 06/16/18 0439  NA 136 130* 132*  K 4.5 >8.5* 4.3  CL 103  --  99  CO2 23  --  25  BUN 14  --  14  CREATININE 0.86  --  0.90  GLUCOSE 113* 110* 115*  CALCIUM 9.3  --  8.5*   Recent Labs    06/14/18 1326  INR 1.1    Physical Exam: ABD soft Intact pulses distally Incision: dressing C/D/I and no drainage Compartment soft Body mass index is 31.63 kg/m. No significant change in neuro exam  Assessment/Plan: Patient stable  xrays n/a Continue mobilization with physical therapy Continue care  1. Continue iron supplements - will recheck Hct in AM 2. Currently teds/SCD for DVT prevention.  Given limited ambulation and recent surgery he is at increased risk for DVT.  Now that he is POD #2 I will start Lovenox.   3. CIR consult done - await insurance approval 4. Continue PT/OT and mobilization.  Will recommend knee brace when ambulating given quad weakness.   5. Mag citrate and  enema today for constipation  Melina Schools, MD [facility] [phone]

## 2018-06-17 NOTE — Progress Notes (Signed)
Inpatient Rehabilitation Admissions Coordinator  I have received approval from [facility] for an inpatient acute rehabilitation admission. Bed is available today. I met with patient at bedside and they are in agreement to admit. I have notified Dr. Rolena Infante by phone to arrange discharge to CIR today. I have notified RN CM/SW , Almyra Free and Michiel Cowboy of plans to admit. I will make the arrangements to admit today.  Danne Baxter, RN, MSN Rehab Admissions Coordinator [phone] 06/17/2018 1:37 PM

## 2018-06-17 NOTE — TOC Transition Note (Signed)
Transition of Care (TOC) - CM/SW Discharge Note Marvetta Gibbons RN, BSN Transitions of Care Cross Coverage for 4NP- RN Case Manager [phone]   Patient Details  Name: Joseph Moyer MRN: [id number] Date of Birth: 02-20-1940  Transition of Care [facility]) CM/SW Contact:  Dawayne Patricia, RN Phone Number: 06/17/2018, 2:04 PM   Clinical Narrative:    Pt stable for transition to IP rehab- per [facility] coordinator pt has insurance approval for IP rehab and bed available today- plan to transition to [facility] IP rehab later today.    Final next level of care: IP Rehab Facility Barriers to Discharge: No Barriers Identified   Patient Goals and CMS Choice Patient states their goals for this hospitalization and ongoing recovery are:: rehab CMS Medicare.gov Compare Post Acute Care list provided to:: Patient Choice offered to / list presented to : Patient  Discharge Placement  [facility] IP rehab                     Discharge Plan and Services In-house Referral: Clinical Social Work Discharge Planning Services: CM Consult Post Acute Care Choice: IP Rehab          DME Arranged: N/A DME Agency: NA       HH Arranged: NA HH Agency: NA        Social Determinants of Health (SDOH) Interventions     Readmission Risk Interventions No flowsheet data found.

## 2018-06-17 NOTE — PMR Pre-admission (Signed)
PMR Admission Coordinator Pre-Admission Assessment  Patient: Joseph Moyer is an 78 y.o., male MRN: [id number] DOB: 11/11/1940 Height: '5\' 10"'  (177.8 cm) Weight: 100 kg  Insurance Information HMO:     PPO: yes     PCP:      IPA:      80/20:      OTHER: medicare advantage PRIMARY: [employer] Medicare      Policy#: [id number]      Subscriber: pt CM Name: automated approval due to [place]      Phone#: [phone]     Fax#: [phone] Pre-Cert#: [id number] approved for 3 days    F/u with Monique at phone [phone] on 5/21 Employer:  Benefits:  Phone #: [phone]     Name: 5/19 Eff. Date: 01/30/2018     Deduct: none      Out of Pocket Max: $4200      Life Max: none CIR: $250 co pay per day days 1 until 6      SNF: no co pay per day days 1 until 20; $178 co pay per day day 21 until 100 Outpatient: #35 per visit     Co-Pay: visits per medical neccesity Home Health: 100%      Co-Pay: visits per medical neccesity DME: 80%     Co-Pay: 20% Providers: in network  SECONDARY: none       Medicaid Application Date:       Case Manager:  Disability Application Date:       Case Worker:   The "Data Collection Information Summary" for patients in Inpatient Rehabilitation Facilities with attached "Privacy Act [place] Records" was provided and verbally reviewed with: Patient  Emergency Contact Information Contact Information    Name Relation Home Work Mobile   Moyer,Joseph Spouse [phone]  [phone]      Current Medical History  Patient Admitting Diagnosis: myelopathy s/p lumbar fusion  History of Present Illness: 78 year old right-handed male with history of hypertension hyperlipidemia.  Presented 06/14/2018 with increasing low back pain radiating to the lower extremities right greater than left times several weeks after lifting a bag of fertilizer.  He denied any bowel or bladder disturbances.  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  Left lateral recess stenosis L2-3.  Underwent bilateral L1-2 discectomy with left hemilaminotomy and lateral recess decompression L2-3 06/15/2018 per Dr. Rolena Infante.  Hospital course pain management.  Lumbar corset when out of bed.  Subcutaneous Lovenox for DVT prophylaxis.  Acute blood loss anemia 10.3.     Patient's medical record from [facility] has been reviewed by the rehabilitation admission coordinator and physician.  Past Medical History  Past Medical History:  Diagnosis Date  . Allergy   . Arthritis   . Hyperlipidemia   . Hypertension     Family History   family history includes Diabetes in his brother, mother, and sister.  Prior Rehab/Hospitalizations Has the patient had prior rehab or hospitalizations prior to admission? Yes  Has the patient had major surgery during 100 days prior to admission? Yes   Current Medications  Current Facility-Administered Medications:  .  0.9 %  sodium chloride infusion, 250 mL, Intravenous, Continuous, Melina Schools, MD .  acetaminophen (TYLENOL) tablet 650 mg, 650 mg, Oral, Q4H PRN **OR** acetaminophen (TYLENOL) suppository 650 mg, 650 mg, Rectal, Q4H PRN, Melina Schools, MD .  amLODipine (NORVASC) tablet 5 mg, 5 mg, Oral, Daily, Angiulli, Daniel J, PA-C .  enoxaparin (LOVENOX) injection 30 mg, 30  mg, Subcutaneous, Q24H, Melina Schools, MD .  ferrous sulfate tablet 325 mg, 325 mg, Oral, BID WC, Melina Schools, MD, 325 mg at 06/17/18 0820 .  gabapentin (NEURONTIN) capsule 300 mg, 300 mg, Oral, TID, Melina Schools, MD, 300 mg at 06/17/18 0819 .  lactated ringers infusion, , Intravenous, Continuous, Melina Schools, MD, Stopped at 06/16/18 [phone] .  magnesium hydroxide (MILK OF MAGNESIA) suspension 30 mL, 30 mL, Oral, Daily PRN, Melina Schools, MD .  menthol-cetylpyridinium (CEPACOL) lozenge 3 mg, 1 lozenge, Oral, PRN **OR** phenol (CHLORASEPTIC) mouth spray 1 spray, 1 spray, Mouth/Throat, PRN, Melina Schools, MD .  methocarbamol (ROBAXIN) tablet 500 mg, 500 mg, Oral, Q6H PRN, 500 mg at 06/17/18 0400 **OR** [DISCONTINUED] methocarbamol (ROBAXIN) 500 mg in dextrose 5 % 50 mL IVPB, 500 mg, Intravenous, Q6H PRN, Melina Schools, MD .  ondansetron (ZOFRAN) tablet 4 mg, 4 mg, Oral, Q6H PRN **OR** [DISCONTINUED] ondansetron (ZOFRAN) injection 4 mg, 4 mg, Intravenous, Q6H PRN, Melina Schools, MD .  oxyCODONE (Oxy IR/ROXICODONE) immediate release tablet 10 mg, 10 mg, Oral, Q3H PRN, Melina Schools, MD, 10 mg at 06/17/18 0820 .  oxyCODONE (Oxy IR/ROXICODONE) immediate release tablet 5 mg, 5 mg, Oral, Q3H PRN, Melina Schools, MD .  simvastatin (ZOCOR) tablet 20 mg, 20 mg, Oral, q1800, Angiulli, Daniel J, PA-C .  sodium chloride flush (NS) 0.9 % injection 3 mL, 3 mL, Intravenous, Q12H, Melina Schools, MD, 3 mL at 06/17/18 0820 .  sodium chloride flush (NS) 0.9 % injection 3 mL, 3 mL, Intravenous, PRN, Melina Schools, MD, 3 mL at 06/15/18 2138 .  sodium phosphate (FLEET) 7-19 GM/118ML enema 1 enema, 1 enema, Rectal, Once PRN, Melina Schools, MD .  tamsulosin (FLOMAX) capsule 0.4 mg, 0.4 mg, Oral, QPC supper, Angiulli, Lavon Paganini, PA-C  Patients Current Diet:  Diet Order            Diet regular Room service appropriate? Yes; Fluid consistency: Thin  Diet effective now              Precautions / Restrictions Precautions Precautions: Fall, Back Precaution Booklet Issued: Yes (comment) Precaution Comments: verbally reviewed with patient Spinal Brace: Lumbar corset Restrictions Weight Bearing Restrictions: No   Has the patient had 2 or more falls or a fall with injury in the past year? No  Prior Activity Level Community (5-7x/wk): Independent without ad ; drives. 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 Independent  Indoor Mobility: Did  the patient need assistance with walking from room to room (with or without device)? Independent  Stairs: Did the patient need assistance with internal or external stairs (with or without device)? Independent  Functional Cognition: Did the patient need help planning regular tasks such as shopping or remembering to take medications? Independent  Home Assistive Devices / Equipment Home Equipment: Wheelchair - manual, Bedside commode  Prior Device Use: Indicate devices/aids used by the patient prior to current illness, exacerbation or injury? None of the above  Current Functional Level Cognition  Overall Cognitive Status: Within Functional Limits for tasks assessed Orientation Level: Oriented X4    Extremity Assessment (includes Sensation/Coordination)  Upper Extremity Assessment: Overall WFL for tasks assessed  Lower Extremity Assessment: Defer to PT evaluation RLE Deficits / Details: noted assymetrical weakness and sesnory deficits RLE, grossly 3-/5  RLE Sensation: decreased light touch RLE Coordination: decreased fine motor  ADLs  Overall ADL's : Needs assistance/impaired Grooming: Set up, Sitting Upper Body Bathing: Set up, Sitting Lower Body Bathing: Sit to/from stand, Maximal assistance Lower Body Bathing Details (indicate cue type and reason): unable to complete figure 4 technique, mod assist sit<>stand  Upper Body Dressing : Set up, Sitting Lower Body Dressing: Maximal assistance, Sit to/from stand Lower Body Dressing Details (indicate cue type and reason): unable to complete figure 4 technique, mod assist sit<>stand  Toilet Transfer: Moderate assistance, RW Toilet Transfer Details (indicate cue type and reason): simulated sit<>stand from [place] [facility] Hy[facility]: Maximal assistance, Sit to/from stand Functional mobility during ADLs: Moderate assistance, Rolling walker General ADL Comments: limited by pain, decreased functional use of R LE,  impaired balance     Mobility  General bed mobility comments: received up in chair    Transfers  Overall transfer level: Needs assistance Equipment used: Rolling walker (2 wheeled) Transfers: Sit to/from Stand Sit to Stand: Mod assist General transfer comment: Continues to require increased assist to power up to standing with modified hand placement on RW.    Ambulation / Gait / Stairs / Wheelchair Mobility  Ambulation/Gait Ambulation/Gait assistance: Herbalist (Feet): 36 Feet Assistive device: Rolling walker (2 wheeled) Gait Pattern/deviations: Step-to pattern, Decreased stride length, Decreased stance time - right, Decreased weight shift to left, Decreased dorsiflexion - right, Shuffle General Gait Details: patient continues to require facilitation and manual assist for RLE movement with progression to some activation and stride with increased distance. VCs for upright posture and sequencing throughout. heavy relaince on RW for UE support. Gait velocity: decreased    Posture / Balance Balance Overall balance assessment: Needs assistance Sitting-balance support: Feet supported Sitting balance-Leahy Scale: Fair Standing balance support: Bilateral upper extremity supported, During functional activity Standing balance-Leahy Scale: Poor Standing balance comment: heavy reliance on external support and assist    Special needs/care consideration BiPAP/CPAP  N/a CPM  N/a Continuous Drip IV  N/a Dialysis  N/a Life Vest  N/a Oxygen  N/a Special Bed  N/a Trach Size  N/a Wound Vac n/a Skin surgical incision Bowel mgmt: complaints constipation today Bladder mgmt: continent Diabetic mgmt:  N/a Behavioral consideration  N/a Chemo/radiation  N/a   Previous Home Environment  Living Arrangements: Spouse/significant other  Lives With: Spouse Available Help at Discharge: Family, Available 24 hours/day Type of Home: House Home Layout: Multi-level, Able to live on main  level with bedroom/bathroom(split level; 1/2 bath downstairs. full bed and bath upstairs) Home Access: Level entry(level entry into lower level area. he has set up inflatable ) Bathroom Shower/Tub: (upstairs. not at lower level. 1/2 bath) Bathroom Toilet: Standard Bathroom Accessibility: Yes How Accessible: Accessible via walker Home Care Services: No  Discharge Living Setting Plans for Discharge Living Setting: Patient's home, Lives with (comment)(wife) Type of Home at Discharge: House Discharge Home Layout: Multi-level(split level; to stay at lower level; level entry 1/2 bath) Discharge Home Access: Level entry Discharge Bathroom Shower/Tub: (1/2 bath only at lower level) Discharge Bathroom Toilet: Standard Discharge Bathroom Accessibility: Yes How Accessible: Accessible via walker Does the patient have any problems obtaining your medications?: No  Social/Family/Support Systems Patient Roles: Spouse Contact Information: Narda Rutherford, wife Anticipated Caregiver: wife Anticipated Caregiver's Contact Information: see above Ability/Limitations of Caregiver: none Caregiver Availability: 24/7 Discharge Plan Discussed with Primary Caregiver: Yes Is Caregiver In Agreement with Plan?: Yes Does Caregiver/Family have Issues with Lodging/Transportation while Pt is in Rehab?: No  Goals/Additional Needs Patient/Family Goal for Rehab: Mod I  to supervision PT, supervision to min OT Expected length of stay: ELOS 7 to 10 days Pt/Family Agrees to Admission and willing to participate: Yes Program Orientation Provided & Reviewed with Pt/Caregiver Including Roles  & Responsibilities: Yes  Decrease burden of Care through IP rehab admission: n/a  Possible need for SNF placement upon discharge:  N/a  Patient Condition: I have reviewed medical records from [facility] ,and  spoken with  patient. I met with patient at the bedside for inpatient rehabilitation assessment.  Patient will benefit from  ongoing PT and OT, can actively participate in 3 hours of therapy a day 5 days of the week, and can make measurable gains during the admission.  Patient will also benefit from the coordinated team approach during an Inpatient Acute Rehabilitation admission.  The patient will receive intensive therapy as well as Rehabilitation physician, nursing, social worker, and care management interventions.  Due to bladder management, bowel management, safety, skin/wound care, disease management, medication administration, pain management and patient education the patient requires 24 hour a day rehabilitation nursing.  The patient is currently  min to mod assist with mobility and basic ADLs.  Discharge setting and therapy post discharge at home with home health is anticipated.  Patient has agreed to participate in the Acute Inpatient Rehabilitation Program and will admit today.  Preadmission Screen Completed By:  Annamary Rummage MSN 06/17/2018 1:51 PM ______________________________________________________________________   Discussed status with Dr. Naaman Plummer  on  06/17/2018 at 1429 and received approval for admission today.  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 Does the need for close, 24 hr/day Medical supervision in concert with the patient's rehab needs make it unreasonable for this patient to be served in a less intensive setting? Yes 2. Co-Morbidities requiring supervision/potential complications: HTN, ABLA, neurogenic bowel 3. Due to bladder management, bowel management, safety, skin/wound care, disease management, medication administration, pain management and patient education, does the patient require 24 hr/day rehab nursing? Yes 4. Does the patient require coordinated care of a physician, rehab nurse, PT (1-2 hrs/day, 5 days/week) and OT (1-2  hrs/day, 5 days/week) to address physical and functional deficits in the context of the above medical diagnosis(es)? Yes Addressing deficits in the following areas: balance, endurance, locomotion, strength, transferring, bowel/bladder control, bathing, dressing, feeding, grooming, toileting and psychosocial support 5. Can the patient actively participate in an intensive therapy program of at least 3 hrs of therapy 5 days a week? Yes 6. The potential for patient to make measurable gains while on inpatient rehab is excellent 7. Anticipated functional outcomes upon discharge from inpatients are: modified independent PT, modified independent OT, n/a SLP 8. Estimated rehab length of stay to reach the above functional goals is: 7-12 days 9. Anticipated D/C setting: Home 10. Anticipated post D/C treatments: [place] therapy 11. Overall Rehab/Functional Prognosis: excellent  MD Signature:   Meredith Staggers, MD, [facility]

## 2018-06-17 NOTE — Progress Notes (Signed)
Physical Therapy Treatment Patient Details Name: Joseph Moyer MRN: [id number] DOB: [birth year] Today's Date: 06/17/2018    History of Present Illness 78 year old male with history of hypertension, hyperlipidemia presents s/p posterior lumbar fusion L1-L2.    PT Comments    Patient seen for activity progression. Patient extremely motivated to progress. Session focused on gait retraining with heavy reliance on RW. Multi modal facilitation techniques required for initiation of RLE stepstride and weight shifting. Some improved activation noted in RLE but remains significantly limited overall. Current recommendations remain appropriate.  Follow Up Recommendations  CIR;Supervision/Assistance - 24 hour     Equipment Recommendations  3in1 (PT);Rolling walker with 5" wheels    Recommendations for Other Services Rehab consult     Precautions / Restrictions Precautions Precautions: Fall;Back Precaution Booklet Issued: Yes (comment) Precaution Comments: verbally reviewed with patient Required Braces or Orthoses: Spinal Brace Spinal Brace: Lumbar corset Restrictions Weight Bearing Restrictions: No    Mobility  Bed Mobility               General bed mobility comments: received up in chair  Transfers Overall transfer level: Needs assistance Equipment used: Rolling walker (2 wheeled) Transfers: Sit to/from Stand Sit to Stand: Mod assist         General transfer comment: Continues to require increased assist to power up to standing with modified hand placement on RW.  Ambulation/Gait Ambulation/Gait assistance: Min assist Gait Distance (Feet): 36 Feet Assistive device: Rolling walker (2 wheeled) Gait Pattern/deviations: Step-to pattern;Decreased stride length;Decreased stance time - right;Decreased weight shift to left;Decreased dorsiflexion - right;Shuffle Gait velocity: decreased   General Gait Details: patient continues to require facilitation and manual assist for  RLE movement with progression to some activation and stride with increased distance. VCs for upright posture and sequencing throughout. heavy relaince on RW for UE support.   Stairs             Wheelchair Mobility    Modified Rankin (Stroke Patients Only)       Balance Overall balance assessment: Needs assistance Sitting-balance support: Feet supported Sitting balance-Leahy Scale: Fair     Standing balance support: Bilateral upper extremity supported;During functional activity Standing balance-Leahy Scale: Poor Standing balance comment: heavy reliance on external support and assist                            Cognition Arousal/Alertness: Awake/alert Behavior During Therapy: WFL for tasks assessed/performed Overall Cognitive Status: Within Functional Limits for tasks assessed                                        Exercises      General Comments        Pertinent Vitals/Pain Pain Assessment: Faces Faces Pain Scale: Hurts even more Pain Location: low back and operative site Pain Descriptors / Indicators: Grimacing;Guarding;Operative site guarding;Sore Pain Intervention(s): Monitored during session    Home Living                      Prior Function            PT Goals (current goals can now be found in the care plan section) Acute Rehab PT Goals Patient Stated Goal: to be able to walk PT Goal Formulation: With patient Time For Goal Achievement: 06/30/18 Potential to Achieve Goals: Good Progress towards PT goals:  Progressing toward goals    Frequency    Min 5X/week      PT Plan Current plan remains appropriate    Co-evaluation              AM-PAC PT "6 Clicks" Mobility   Outcome Measure  Help needed turning from your back to your side while in a flat bed without using bedrails?: A Little Help needed moving from lying on your back to sitting on the side of a flat bed without using bedrails?: A  Little Help needed moving to and from a bed to a chair (including a wheelchair)?: A Lot Help needed standing up from a chair using your arms (e.g., wheelchair or bedside chair)?: A Lot Help needed to walk in hospital room?: A Lot Help needed climbing 3-5 steps with a railing? : A Lot 6 Click Score: 14    End of Session Equipment Utilized During Treatment: Gait belt;Back brace Activity Tolerance: Patient tolerated treatment well Patient left: in chair;with call bell/phone within reach;with chair alarm set Nurse Communication: Mobility status PT Visit Diagnosis: Muscle weakness (generalized) (M62.81);Difficulty in walking, not elsewhere classified (R26.2);Other symptoms and signs involving the nervous system (V37.106)     Time: 2694-8546 PT Time Calculation (min) (ACUTE ONLY): 21 min  Charges:  $Gait Training: 8-22 mins                     Alben Deeds, PT DPT  Board Certified Neurologic Specialist [place] Pager [phone] Office [place] 06/17/2018, 9:32 AM

## 2018-06-17 NOTE — Progress Notes (Signed)
Inpatient Rehabilitation Admissions Coordinator  Inpatient Rehab Consult received. I met with patient at the bedside for rehabilitation assessment. We discussed goals and expectations of an inpatient rehab admission.  He would like to admit to CIR. I will begin insurance approval with [facility] for a possible admission today. I will f/u after receiving authorization and benefits to review with patient.  Danne Baxter, RN, MSN Rehab Admissions Coordinator [phone] 06/17/2018 10:02 AM

## 2018-06-17 NOTE — Progress Notes (Signed)
Meredith Staggers, MD  Physician  Physical Medicine and Rehabilitation  PMR Pre-admission  Signed  Date of Service:  06/17/2018 1:42 PM       Related encounter: Admission (Discharged) from 06/14/2018 in [place]         Show:Clear all '[x]' Manual'[x]' Template'[x]' Copied  Added by: '[x]' Cristina Gong, RN'[x]' Meredith Staggers, MD  '[]' Hover for details PMR Admission Coordinator Pre-Admission Assessment  Patient: Joseph Moyer is an 78 y.o., male MRN: [id number] DOB: 02/10/1940 Height: '5\' 10"'  (177.8 cm) Weight: 100 kg  Insurance Information HMO:     PPO: yes     PCP:      IPA:      80/20:      OTHER: medicare advantage PRIMARY: [employer] Medicare      Policy#: [id number]      Subscriber: pt CM Name: automated approval due to [place]      Phone#: [phone]     Fax#: [phone] Pre-Cert#: [id number] approved for 3 days    F/u with Monique at phone [phone] on 5/21 Employer:  Benefits:  Phone #: [phone]     Name: 5/19 Eff. Date: 01/30/2018     Deduct: none      Out of Pocket Max: $4200      Life Max: none CIR: $250 co pay per day days 1 until 6      SNF: no co pay per day days 1 until 20; $178 co pay per day day 21 until 100 Outpatient: #35 per visit     Co-Pay: visits per medical neccesity Home Health: 100%      Co-Pay: visits per medical neccesity DME: 80%     Co-Pay: 20% Providers: in network  SECONDARY: none       Medicaid Application Date:       Case Manager:  Disability Application Date:       Case Worker:   The "Data Collection Information Summary" for patients in Inpatient Rehabilitation Facilities with attached "Privacy Act [place] Records" was provided and verbally reviewed with: Patient  Emergency Contact Information         Contact Information    Name Relation Home Work Mobile   Fyock,[name] Spouse [phone]  [phone]      Current Medical History  Patient Admitting  Diagnosis: myelopathy s/p lumbar fusion  History of Present Illness: 78 year old right-handed male with history of hypertension hyperlipidemia. Presented 06/14/2018 with increasing low back pain radiating to the lower extremities right greater than left times several weeks after lifting a bag of fertilizer. He denied any bowel or bladder disturbances. 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 Left lateral recess stenosis L2-3. Underwent bilateral L1-2 discectomy with left hemilaminotomy and lateral recess decompression L2-3 06/15/2018 per Dr. Rolena Infante. Hospital course pain management. Lumbar corset when out of bed. Subcutaneous Lovenox for DVT prophylaxis. Acute blood loss anemia 10.3.    Patient's medical record from [facility] has been reviewed by the rehabilitation admission coordinator and physician.  Past Medical History      Past Medical History:  Diagnosis Date  . Allergy   . Arthritis   . Hyperlipidemia   . Hypertension     Family History   family history includes Diabetes in his brother, mother, and sister.  Prior Rehab/Hospitalizations Has the patient had prior rehab or hospitalizations prior to admission? Yes  Has the patient had major surgery during 100 days prior  to admission? Yes             Current Medications  Current Facility-Administered Medications:  .  0.9 %  sodium chloride infusion, 250 mL, Intravenous, Continuous, Melina Schools, MD .  acetaminophen (TYLENOL) tablet 650 mg, 650 mg, Oral, Q4H PRN **OR** acetaminophen (TYLENOL) suppository 650 mg, 650 mg, Rectal, Q4H PRN, Melina Schools, MD .  amLODipine (NORVASC) tablet 5 mg, 5 mg, Oral, Joseph, Angiulli, Daniel J, PA-C .  enoxaparin (LOVENOX) injection 30 mg, 30 mg, Subcutaneous, Q24H, Brooks, Dahari, MD .  ferrous sulfate tablet 325 mg, 325 mg, Oral, BID WC, Melina Schools, MD, 325 mg at 06/17/18 0820 .   gabapentin (NEURONTIN) capsule 300 mg, 300 mg, Oral, TID, Melina Schools, MD, 300 mg at 06/17/18 0819 .  lactated ringers infusion, , Intravenous, Continuous, Melina Schools, MD, Stopped at 06/16/18 [id number] .  magnesium hydroxide (MILK OF MAGNESIA) suspension 30 mL, 30 mL, Oral, Joseph PRN, Melina Schools, MD .  menthol-cetylpyridinium (CEPACOL) lozenge 3 mg, 1 lozenge, Oral, PRN **OR** phenol (CHLORASEPTIC) mouth spray 1 spray, 1 spray, Mouth/Throat, PRN, Melina Schools, MD .  methocarbamol (ROBAXIN) tablet 500 mg, 500 mg, Oral, Q6H PRN, 500 mg at 06/17/18 0400 **OR** [DISCONTINUED] methocarbamol (ROBAXIN) 500 mg in dextrose 5 % 50 mL IVPB, 500 mg, Intravenous, Q6H PRN, Melina Schools, MD .  ondansetron (ZOFRAN) tablet 4 mg, 4 mg, Oral, Q6H PRN **OR** [DISCONTINUED] ondansetron (ZOFRAN) injection 4 mg, 4 mg, Intravenous, Q6H PRN, Melina Schools, MD .  oxyCODONE (Oxy IR/ROXICODONE) immediate release tablet 10 mg, 10 mg, Oral, Q3H PRN, Melina Schools, MD, 10 mg at 06/17/18 0820 .  oxyCODONE (Oxy IR/ROXICODONE) immediate release tablet 5 mg, 5 mg, Oral, Q3H PRN, Melina Schools, MD .  simvastatin (ZOCOR) tablet 20 mg, 20 mg, Oral, q1800, Angiulli, Daniel J, PA-C .  sodium chloride flush (NS) 0.9 % injection 3 mL, 3 mL, Intravenous, Q12H, Melina Schools, MD, 3 mL at 06/17/18 0820 .  sodium chloride flush (NS) 0.9 % injection 3 mL, 3 mL, Intravenous, PRN, Melina Schools, MD, 3 mL at 06/15/18 2138 .  sodium phosphate (FLEET) 7-19 GM/118ML enema 1 enema, 1 enema, Rectal, Once PRN, Melina Schools, MD .  tamsulosin (FLOMAX) capsule 0.4 mg, 0.4 mg, Oral, QPC supper, Angiulli, Lavon Paganini, PA-C  Patients Current Diet:     Diet Order                  Diet regular Room service appropriate? Yes; Fluid consistency: Thin  Diet effective now               Precautions / Restrictions Precautions Precautions: Fall, Back Precaution Booklet Issued: Yes (comment) Precaution Comments: verbally reviewed with  patient Spinal Brace: Lumbar corset Restrictions Weight Bearing Restrictions: No   Has the patient had 2 or more falls or a fall with injury in the past year? No  Prior Activity Level Community (5-7x/wk): Independent without ad ; drives. 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 Independent  Indoor Mobility: Did the patient need assistance with walking from room to room (with or without device)? Independent  Stairs: Did the patient need assistance with internal or external stairs (with or without device)? Independent  Functional Cognition: Did the patient need help planning regular tasks such as shopping or remembering to take medications? [place] / [place]  Equipment: Wheelchair - manual, Bedside commode  Prior Device Use: Indicate devices/aids used by the patient prior to current illness, exacerbation or injury? None of the above  Current Functional Level Cognition  Overall Cognitive Status: Within Functional Limits for tasks assessed Orientation Level: Oriented X4    Extremity Assessment (includes Sensation/Coordination)  Upper Extremity Assessment: Overall WFL for tasks assessed  Lower Extremity Assessment: Defer to PT evaluation RLE Deficits / Details: noted assymetrical weakness and sesnory deficits RLE, grossly 3-/5  RLE Sensation: decreased light touch RLE Coordination: decreased fine motor    ADLs  Overall ADL's : Needs assistance/impaired Grooming: Set up, Sitting Upper Body Bathing: Set up, Sitting Lower Body Bathing: Sit to/from stand, Maximal assistance Lower Body Bathing Details (indicate cue type and reason): unable to complete figure 4 technique, mod assist sit<>stand  Upper Body Dressing : Set up, Sitting Lower Body Dressing: Maximal assistance, Sit  to/from stand Lower Body Dressing Details (indicate cue type and reason): unable to complete figure 4 technique, mod assist sit<>stand  Toilet Transfer: Moderate assistance, RW Toilet Transfer Details (indicate cue type and reason): simulated sit<>stand from [place] and Hygiene: Maximal assistance, Sit to/from stand Functional mobility during ADLs: Moderate assistance, Rolling walker General ADL Comments: limited by pain, decreased functional use of R LE, impaired balance     Mobility  General bed mobility comments: received up in chair    Transfers  Overall transfer level: Needs assistance Equipment used: Rolling walker (2 wheeled) Transfers: Sit to/from Stand Sit to Stand: Mod assist General transfer comment: Continues to require increased assist to power up to standing with modified hand placement on RW.    Ambulation / Gait / Stairs / Wheelchair Mobility  Ambulation/Gait Ambulation/Gait assistance: Herbalist (Feet): 36 Feet Assistive device: Rolling walker (2 wheeled) Gait Pattern/deviations: Step-to pattern, Decreased stride length, Decreased stance time - right, Decreased weight shift to left, Decreased dorsiflexion - right, Shuffle General Gait Details: patient continues to require facilitation and manual assist for RLE movement with progression to some activation and stride with increased distance. VCs for upright posture and sequencing throughout. heavy relaince on RW for UE support. Gait velocity: decreased    Posture / Balance Balance Overall balance assessment: Needs assistance Sitting-balance support: Feet supported Sitting balance-Leahy Scale: Fair Standing balance support: Bilateral upper extremity supported, During functional activity Standing balance-Leahy Scale: Poor Standing balance comment: heavy reliance on external support and assist    Special needs/care consideration BiPAP/CPAP  N/a CPM  N/a  Continuous Drip IV  N/a Dialysis  N/a Life Vest  N/a Oxygen  N/a Special Bed  N/a Trach Size  N/a Wound Vac n/a Skin surgical incision Bowel mgmt: complaints constipation today Bladder mgmt: continent Diabetic mgmt:  N/a Behavioral consideration  N/a Chemo/radiation  N/a   Previous Home Environment  Living Arrangements: Spouse/significant other  Lives With: Spouse Available Help at Discharge: Family, Available 24 hours/day Type of Home: House Home Layout: Multi-level, Able to live on main level with bedroom/bathroom(split level; 1/2 bath downstairs. full bed and bath upstairs) Home Access: Level entry(level entry into lower level area. he has set up inflatable ) Bathroom Shower/Tub: (upstairs. not at lower level. 1/2 bath) Bathroom Toilet: Standard Bathroom Accessibility: Yes How Accessible: Accessible via walker Home Care Services: No  Discharge Living Setting Plans for Discharge Living Setting: Patient's home, Lives with (comment)(wife) Type of Home at Discharge: House Discharge Home Layout: Multi-level(split level; to stay at lower level; level entry 1/2 bath) Discharge  Home Access: Level entry Discharge Bathroom Shower/Tub: (1/2 bath only at lower level) Discharge Bathroom Toilet: Standard Discharge Bathroom Accessibility: Yes How Accessible: Accessible via walker Does the patient have any problems obtaining your medications?: No  Social/Family/Support Systems Patient Roles: Spouse Contact Information: Narda Rutherford, wife Anticipated Caregiver: wife Anticipated Caregiver's Contact Information: see above Ability/Limitations of Caregiver: none Caregiver Availability: 24/7 Discharge Plan Discussed with Primary Caregiver: Yes Is Caregiver In Agreement with Plan?: Yes Does Caregiver/Family have Issues with Lodging/Transportation while Pt is in Rehab?: No  Goals/Additional Needs Patient/Family Goal for Rehab: Mod I to supervision PT, supervision to min OT Expected length  of stay: ELOS 7 to 10 days Pt/Family Agrees to Admission and willing to participate: Yes Program Orientation Provided & Reviewed with Pt/Caregiver Including Roles  & Responsibilities: Yes  Decrease burden of Care through IP rehab admission: n/a  Possible need for SNF placement upon discharge:  N/a  Patient Condition: I have reviewed medical records from [facility] ,and  spoken with  patient. I met with patient at the bedside for inpatient rehabilitation assessment.  Patient will benefit from ongoing PT and OT, can actively participate in 3 hours of therapy a day 5 days of the week, and can make measurable gains during the admission.  Patient will also benefit from the coordinated team approach during an Inpatient Acute Rehabilitation admission.  The patient will receive intensive therapy as well as Rehabilitation physician, nursing, social worker, and care management interventions.  Due to bladder management, bowel management, safety, skin/wound care, disease management, medication administration, pain management and patient education the patient requires 24 hour a day rehabilitation nursing.  The patient is currently  min to mod assist with mobility and basic ADLs.  Discharge setting and therapy post discharge at home with home health is anticipated.  Patient has agreed to participate in the Acute Inpatient Rehabilitation Program and will admit today.  Preadmission Screen Completed By:  Annamary Rummage MSN 06/17/2018 1:51 PM ______________________________________________________________________   Discussed status with Dr. Naaman Plummer  on  06/17/2018 at 1429 and received approval for admission today.  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 Does the need for close, 24 hr/day Medical supervision in concert with the patient's  rehab needs make it unreasonable for this patient to be served in a less intensive setting? Yes 2. Co-Morbidities requiring supervision/potential complications: HTN, ABLA, neurogenic bowel 3. Due to bladder management, bowel management, safety, skin/wound care, disease management, medication administration, pain management and patient education, does the patient require 24 hr/day rehab nursing? Yes 4. Does the patient require coordinated care of a physician, rehab nurse, PT (1-2 hrs/day, 5 days/week) and OT (1-2 hrs/day, 5 days/week) to address physical and functional deficits in the context of the above medical diagnosis(es)? Yes Addressing deficits in the following areas: balance, endurance, locomotion, strength, transferring, bowel/bladder control, bathing, dressing, feeding, grooming, toileting and psychosocial support 5. Can the patient actively participate in an intensive therapy program of at least 3 hrs of therapy 5 days a week? Yes 6. The potential for patient to make measurable gains while on inpatient rehab is excellent 7. Anticipated functional outcomes upon discharge from inpatients are: modified independent PT, modified independent OT, n/a SLP 8. Estimated rehab length of stay to reach the above functional goals is: 7-12 days 9. Anticipated D/C setting: Home 10. Anticipated post D/C treatments: [place] therapy 11. Overall Rehab/Functional  Prognosis: excellent  MD Signature:   Meredith Staggers, MD, FAAPMR         Revision History

## 2018-06-18 ENCOUNTER — Inpatient Hospital Stay (HOSPITAL_COMMUNITY): Payer: Medicare HMO | Admitting: Physical Therapy

## 2018-06-18 ENCOUNTER — Inpatient Hospital Stay (HOSPITAL_COMMUNITY): Payer: Medicare HMO

## 2018-06-18 ENCOUNTER — Inpatient Hospital Stay (HOSPITAL_COMMUNITY): Payer: Medicare HMO | Admitting: Occupational Therapy

## 2018-06-18 DIAGNOSIS — M7989 Other specified soft tissue disorders: Secondary | ICD-10-CM

## 2018-06-18 DIAGNOSIS — K592 Neurogenic bowel, not elsewhere classified: Secondary | ICD-10-CM

## 2018-06-18 DIAGNOSIS — G9581 Conus medullaris syndrome: Secondary | ICD-10-CM

## 2018-06-18 LAB — CBC WITH DIFFERENTIAL/PLATELET
Abs Immature Granulocytes: 0.02 10*3/uL (ref 0.00–0.07)
Basophils Absolute: 0 10*3/uL (ref 0.0–0.1)
Basophils Relative: 0 %
Eosinophils Absolute: 0.1 10*3/uL (ref 0.0–0.5)
Eosinophils Relative: 2 %
HCT: 29.5 % — ABNORMAL LOW (ref 39.0–52.0)
Hemoglobin: 9.6 g/dL — ABNORMAL LOW (ref 13.0–17.0)
Immature Granulocytes: 0 %
Lymphocytes Relative: 30 %
Lymphs Abs: 2.2 10*3/uL (ref 0.7–4.0)
MCH: 28.4 pg (ref 26.0–34.0)
MCHC: 32.5 g/dL (ref 30.0–36.0)
MCV: 87.3 fL (ref 80.0–100.0)
Monocytes Absolute: 0.4 10*3/uL (ref 0.1–1.0)
Monocytes Relative: 6 %
Neutro Abs: 4.5 10*3/uL (ref 1.7–7.7)
Neutrophils Relative %: 62 %
Platelets: 192 10*3/uL (ref 150–400)
RBC: 3.38 MIL/uL — ABNORMAL LOW (ref 4.22–5.81)
RDW: 13.9 % (ref 11.5–15.5)
WBC: 5.8 10*3/uL (ref 4.0–10.5)
nRBC: 0 % (ref 0.0–0.2)

## 2018-06-18 LAB — COMPREHENSIVE METABOLIC PANEL
ALT: 90 U/L — ABNORMAL HIGH (ref 0–44)
AST: 70 U/L — ABNORMAL HIGH (ref 15–41)
Albumin: 3.1 g/dL — ABNORMAL LOW (ref 3.5–5.0)
Alkaline Phosphatase: 50 U/L (ref 38–126)
Anion gap: 12 (ref 5–15)
BUN: 15 mg/dL (ref 8–23)
CO2: 26 mmol/L (ref 22–32)
Calcium: 8.5 mg/dL — ABNORMAL LOW (ref 8.9–10.3)
Chloride: 95 mmol/L — ABNORMAL LOW (ref 98–111)
Creatinine, Ser: 0.87 mg/dL (ref 0.61–1.24)
GFR calc Af Amer: >60 mL/min (ref >60)
GFR calc non Af Amer: >60 mL/min (ref >60)
Glucose, Bld: 116 mg/dL — ABNORMAL HIGH (ref 70–99)
Potassium: 4 mmol/L (ref 3.5–5.1)
Sodium: 133 mmol/L — ABNORMAL LOW (ref 135–145)
Total Bilirubin: 0.8 mg/dL (ref 0.3–1.2)
Total Protein: 6.6 g/dL (ref 6.5–8.1)

## 2018-06-18 MED ORDER — ENSURE ENLIVE PO LIQD
237.0000 mL | Freq: Two times a day (BID) | ORAL | Status: DC
  Administered 2018-06-18 – 2018-06-25 (×12): 237 mL via ORAL

## 2018-06-18 MED ORDER — ADULT MULTIVITAMIN W/MINERALS CH
1.0000 | ORAL_TABLET | Freq: Every day | ORAL | Status: DC
  Administered 2018-06-18 – 2018-06-26 (×9): 1 via ORAL
  Filled 2018-06-18 (×9): qty 1

## 2018-06-18 MED ORDER — SORBITOL 70 % SOLN
60.0000 mL | Status: AC
  Administered 2018-06-18: 60 mL via ORAL
  Filled 2018-06-18: qty 60

## 2018-06-18 NOTE — Progress Notes (Signed)
Inpatient Rehabilitation  Patient information reviewed and entered into eRehab system by Kayline Sheer M. Kathelene Rumberger, M.A., CCC/SLP, PPS Coordinator.  Information including medical coding, functional ability and quality indicators will be reviewed and updated through discharge.    

## 2018-06-18 NOTE — Progress Notes (Signed)
[facility] PHYSICAL MEDICINE & REHABILITATION PROGRESS NOTE   Subjective/Complaints: Had a good night. Anxious to get started with therapies. Pain controlled. RLE stronger?  ROS: Patient denies fever, rash, sore throat, blurred vision, nausea, vomiting, diarrhea, cough, shortness of breath or chest pain, joint or back pain, headache, or mood change.    Objective:   No results found. Recent Labs    06/16/18 0439 06/18/18 0514  WBC 6.8 5.8  HGB 10.3* 9.6*  HCT 30.7* 29.5*  PLT 202 192   Recent Labs    06/16/18 0439 06/18/18 0514  NA 132* 133*  K 4.3 4.0  CL 99 95*  CO2 25 26  GLUCOSE 115* 116*  BUN 14 15  CREATININE 0.90 0.87  CALCIUM 8.5* 8.5*    Intake/Output Summary (Last 24 hours) at 06/18/2018 0918 Last data filed at 06/18/2018 0814 Gross per 24 hour  Intake 240 ml  Output 400 ml  Net -160 ml     Physical Exam: Vital Signs Blood pressure 128/66, pulse (!) 105, temperature 99.2 F (37.3 C), temperature source Oral, resp. rate 17, height 5\' 11"  (1.803 m), weight 86.1 kg, SpO2 95 %. Constitutional: No distress . Vital signs reviewed. HEENT: EOMI, oral membranes moist Neck: supple Cardiovascular: RRR without murmur. No JVD    Respiratory: CTA Bilaterally without wheezes or rales. Normal effort    GI: BS +, non-tender, non-distended  Musculoskeletal:  General: No edema.  Comments: LB TTP, in LSO. Neurological: He is alertand oriented to person, place, and time. Patient is alert in no acute distress. Follows full commands. UE 5/5. RLE: HF 2 to 2+, KE 1+ to 2, ADF 3, APF 4/5, KF 4/5, HE 4/5. LLE:   4/5.---some improvement in motor exam.  Decreased sensation over primarily over right L2, L3 dermatomes. DTR's absent in LE's.  Skin: Skin iswarm. He isnot diaphoretic. Back incision remains clean and dry Psychiatric: pleasant and cooperative   Assessment/Plan: 1. Functional deficits secondary to lumbar stenosis/HNP with conus syndrome which  require 3+ hours per day of interdisciplinary therapy in a comprehensive inpatient rehab setting.  Physiatrist is providing close team supervision and 24 hour management of active medical problems listed below.  Physiatrist and rehab team continue to assess barriers to discharge/monitor patient progress toward functional and medical goals  Care Tool:  Bathing  Bathing activity did not occur: Refused           Bathing assist       Upper Body Dressing/Undressing Upper body dressing        Upper body assist      Lower Body Dressing/Undressing Lower body dressing            Lower body assist       Toileting Toileting    Toileting assist Assist for toileting: Moderate Assistance - Patient 50 - 74%     Transfers Chair/bed transfer  Transfers assist     Chair/bed transfer assist level: Moderate Assistance - Patient 50 - 74%     Locomotion Ambulation   Ambulation assist              Walk 10 feet activity   Assist           Walk 50 feet activity   Assist           Walk 150 feet activity   Assist           Walk 10 feet on uneven surface  activity   Assist  Wheelchair     Assist               Wheelchair 50 feet with 2 turns activity    Assist            Wheelchair 150 feet activity     Assist          Medical Problem List and Plan: 1.Decreased functional mobilitysecondary to severemultifactorialspinal stenosis 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 Status post L1-2 bilateral discectomy with left hemilaminotomy 06/15/2018. Lumbar corset when out of bed -Patient is beginning CIR therapies today including PT and OT   -showing some improvement in RLE strength 2. Antithrombotics: -DVT/anticoagulation:Subcutaneous Lovenox. Check vascular study -antiplatelet therapy: N/A 3. Pain Management:Neurontin 300 mg 3  times daily, Robaxin and oxycodone as needed 4. Mood:Provide emotional support -antipsychotic agents: N/A 5. Neuropsych: This patientiscapable of making decisions on hisown behalf. 6. Skin/Wound Care:Routine skin checks 7. Fluids/Electrolytes/Nutrition:encourage PO  -I personally reviewed the patient's labs today.   8. Acute blood loss anemia. hgb sl down to 9.6  -no overt signs of blood loss  -continue monitor 9.Hypertension. Patient on Norvasc 5 mg daily prior to admission. Resume as needed 10. BPH. Resume Flomax 0.4 mg daily. -pt states he's been voiding without significant difficulty 11. Hyperlipidemia. Resume Zocor 12. Constipation vs Neurogenic bowel: continue to augment bowel regimen    -sorbitol today   -SSE if no results  LOS: 1 days A FACE TO FACE EVALUATION WAS PERFORMED  Meredith Staggers 06/18/2018, 9:18 AM

## 2018-06-18 NOTE — Progress Notes (Signed)
Initial Nutrition Assessment  RD working remotely.  DOCUMENTATION CODES:   Not applicable  INTERVENTION:   - Ensure Enlive po BID, each supplement provides 350 kcal and 20 grams of protein  - MVI with minerals daily  NUTRITION DIAGNOSIS:   Increased nutrient needs related to other (therapies) as evidenced by estimated needs.  GOAL:   Patient will meet greater than or equal to 90% of their needs  MONITOR:   PO intake, Supplement acceptance, Labs, Weight trends  REASON FOR ASSESSMENT:   Malnutrition Screening Tool    ASSESSMENT:   78 year old male with PMH of HTN, HLD. Presented 06/14/18 with increasing low back pain radiating to the lower extremities for several weeks after lifting a bag of fertilizer. 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 Pt underwent bilateral L1-2 discectomy with left hemilaminotomy and lateral recess decompression L2-3 06/15/18. Pt admitted to CIR on 5/18.  Attempted to speak with pt via phone call to room during times when pt was not scheduled for therapies. Busy tone was present on all attempts.  Reviewed weight history in chart. Suspect weight of 100 kg on 5/16 was scale error as weight on 5/18 is recorded as 86.1 kg. The last weight available prior to this month is from 2018.  RD to order oral nutrition supplement to aid pt in meeting kcal and protein needs. Will also order daily MVI.  Meal Completion: 40% x 1 meal  Medications reviewed and include: ferrous sulfate 235 mg BID  Labs reviewed: sodium 133 (L), elevated LFTs, hemoglobin 9.6 (L)  NUTRITION - FOCUSED PHYSICAL EXAM:  Unable to complete at this time. RD working remotely.  Diet Order:   Diet Order            Diet regular Room service appropriate? Yes; Fluid consistency: Thin  Diet effective now              EDUCATION NEEDS:   No education needs have been identified at this  time  Skin:  Skin Assessment: Skin Integrity Issues: Incisions: surgical incision to back  Last BM:  no documented BM  Height:   Ht Readings from Last 1 Encounters:  06/17/18 5\' 11"  (1.803 m)    Weight:   Wt Readings from Last 1 Encounters:  06/17/18 86.1 kg    Ideal Body Weight:  78.2 kg  BMI:  Body mass index is 26.47 kg/m.  Estimated Nutritional Needs:   Kcal:  1950-2150  Protein:  100-115 grams  Fluid:  >/= 1.9 L    Gaynell Face, MS, RD, LDN Inpatient Clinical Dietitian Pager: [phone] Weekend/After Hours: [phone]

## 2018-06-18 NOTE — Plan of Care (Signed)
  Problem: Consults Goal: RH SPINAL CORD INJURY PATIENT EDUCATION Description  See Patient Education module for education specifics.  Outcome: Progressing   Problem: SCI BOWEL ELIMINATION Goal: RH STG MANAGE BOWEL WITH ASSISTANCE Description STG Manage Bowel with mod I Assistance.  Outcome: Progressing Goal: RH STG SCI MANAGE BOWEL WITH MEDICATION WITH ASSISTANCE Description STG SCI Manage bowel with medication with mod I assistance.  Outcome: Progressing   Problem: RH SKIN INTEGRITY Goal: RH STG ABLE TO PERFORM INCISION/WOUND CARE W/ASSISTANCE Description STG Able To Perform Incision/Wound Care With min Assistance.  Outcome: Progressing   Problem: RH SAFETY Goal: RH STG ADHERE TO SAFETY PRECAUTIONS W/ASSISTANCE/DEVICE Description STG Adhere to Safety Precautions With cues/supervision Assistance/Device.  Outcome: Progressing   Problem: RH PAIN MANAGEMENT Goal: RH STG PAIN MANAGED AT OR BELOW PT'S PAIN GOAL Description At or below level 5  Outcome: Progressing   Problem: RH KNOWLEDGE DEFICIT SCI Goal: RH STG INCREASE KNOWLEDGE OF SELF CARE AFTER SCI Description Pt will be able to direct care at discharge independently using handouts/educational resources independently  Outcome: Progressing

## 2018-06-18 NOTE — Progress Notes (Signed)
Bilateral lower extremity venous duplex has been completed. Preliminary results can be found in CV Proc through chart review.   06/18/18 11:27 AM Joseph Moyer RVT

## 2018-06-18 NOTE — Evaluation (Addendum)
Physical Therapy Assessment and Plan  Patient Details  Name: Joseph Moyer MRN: [id number] Date of Birth: 10-21-1940  PT Diagnosis: Abnormality of gait, Difficulty walking, Muscle weakness and Pain in back Rehab Potential: Excellent ELOS: 7-10 days   Today's Date: 06/18/2018 PT Individual Time: 0092-3300 and 1205-1230 PT Individual Time Calculation (min): 55 min and 25 min  Problem List:  Patient Active Problem List   Diagnosis Date Noted  . 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 S/P lumbar fusion 06/15/2018  . 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 Overweight (BMI 25.0-29.9) 07/31/2014  . Acute diverticulitis 07/30/2014  . Elevated BP 07/30/2014  . Hyponatremia 07/30/2014  . Hyperlipidemia 07/30/2014  . HTN (hypertension) 07/30/2014    Past Medical History:  Past Medical History:  Diagnosis Date  . Allergy   . Arthritis   . Hyperlipidemia   . Hypertension    Past Surgical History:  Past Surgical History:  Procedure Laterality Date  . CIRCUMCISION    . COLONOSCOPY WITH PROPOFOL N/A 05/10/2015   Procedure: COLONOSCOPY WITH PROPOFOL;  Surgeon: Garlan Fair, MD;  Location: [facility];  Service: Endoscopy;  Laterality: N/A;  . DECOMPRESSIVE LUMBAR LAMINECTOMY LEVEL 1 N/A 06/15/2018   Procedure: Gordy Levan decompression L1-L2/ complete inferior facetectomyof L1. Posterior lateral arthrodesiswith autograft bone L1-L2.;  Surgeon: Melina Schools, MD;  Location: [place];  Service: Orthopedics;  Laterality: N/A;  . HEMORRHOID SURGERY      Assessment & Plan Clinical Impression: Patient is a 78 y.o. year old male with history of hypertension hyperlipidemia. Per chart review patient lives with spouse. Independent prior to admission. Multilevel home. Presented 06/14/2018 with increasing low back pain radiating to the lower extremities right greater than left times several weeks after lifting a bag of fertilizer. He denied any bowel or bladder disturbances. 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 Left lateral recess stenosis L2-3. Underwent bilateral L1-2 discectomy with left hemilaminotomy and lateral recess decompression L2-3 06/15/2018 per Dr. Rolena Infante. Hospital course pain management. Lumbar corset when out of bed. Subcutaneous Lovenox for DVT prophylaxis. Acute blood loss anemia 10.3. Therapy evaluations completed and patient was admitted for a comprehensive rehab program.  Patient transferred to CIR on 06/17/2018 .   Patient currently requires min with mobility secondary to muscle weakness, decreased cardiorespiratoy endurance, decreased coordination, and decreased standing balance, decreased postural control, decreased balance strategies and difficulty maintaining precautions.  Prior to hospitalization, patient was independent  with mobility and lived with Spouse in a House home.  Home access is  Level entry.  Patient will benefit from skilled PT intervention to maximize safe functional mobility, minimize fall risk and decrease caregiver burden for planned discharge home with intermittent assist.  Anticipate patient will benefit from follow up [facility] at discharge.  PT - End of Session Activity Tolerance: Tolerates 30+ min activity with multiple rests Endurance Deficit: Yes Endurance Deficit Description: 2/2 fatigue PT Assessment Rehab Potential (ACUTE/IP ONLY): Excellent PT Barriers to Discharge: Home environment access/layout PT Barriers to Discharge Comments: 5 steps + 5 steps with 1 rail to access bed/bath on 2nd level PT Patient demonstrates impairments in the following area(s): Balance;Skin Integrity;Pain;Endurance;Safety;Motor;Sensory PT Transfers Functional Problem(s): Bed Mobility;Bed to Chair;Car;Furniture PT Locomotion Functional Problem(s): Ambulation;Wheelchair Mobility;Stairs PT Plan PT Intensity: Minimum of 1-2 x/day ,45 to 90 minutes PT Frequency: 5 out of 7 days PT Duration  Estimated Length of Stay: 7-10 days PT Treatment/Interventions: Ambulation/gait training;Discharge planning;DME/adaptive equipment instruction;Functional mobility training;Pain management;Psychosocial support;Splinting/orthotics;UE/LE Strength taining/ROM;Therapeutic Activities;UE/LE  Coordination activities;Wheelchair propulsion/positioning;Therapeutic Exercise;Stair training;Skin care/wound management;Patient/family education;Neuromuscular re-education;Functional electrical stimulation;Disease management/prevention;Community reintegration;Balance/vestibular training PT Transfers Anticipated Outcome(s): supervision with LRAD PT Locomotion Anticipated Outcome(s): supervision with LRAD PT Recommendation Recommendations for Other Services: Therapeutic Recreation consult Therapeutic Recreation Interventions: Stress management Follow Up Recommendations: Home health PT Patient destination: Home Equipment Recommended: Rolling walker with 5" wheels  Skilled Therapeutic Intervention Treatment 1: Pt received in bed & agreeable to tx. Pt reports 0/10 pain at rest that increases to 5/10 with activity & rest breaks provided PRN. Educated pt on ELOS, daily therapy schedule, weekly team meetings, safety plan/reviewed use of call bell, and other CIR information with pt voicing no additional questions at this time. Educated pt on need to don lumbar corset in sitting EOB, weight lifting restrictions, and back precautions. Pt performs rolling in bed L<>R with bed flat, bed rails, and min assist for positioning BLE and preventing RLE from splaying out 2/2 weakness. Pt requires min assist to manage BLE to transfer R sidelying>sitting EOB, sit<>Stand with min assist and instructional cuing for hand placement. Therapist provides pt with paper scrubs & pt dons shirt sitting EOB with supervision, total assist for donning pants EOB & sit<>stand EOB for time management. Pt is able to march in place without any RLE buckling noted  so pt takes a few steps to w/c. Transported pt to gym via w/c dependent assist for time management. Pt completes car transfer at SUV (ford escape) simulated height with RW & min assist with instructional cuing for technique. Provided pt with w/c cushion to prevent skin breakdown when OOB. Pt propels w/c back to room with BUE & supervision with instructional cuing for technique. At end of session pt left in w/c with belt alarm donned & all needs at hand, reviewed use of call bell with pt.  Treatment 2: Pt received in bed & agreeable to tx. Pt performs rolling and R sidelying>sitting EOB with hospital bed features, min assist and min cuing for technique to maintain back precautions. Pt already with corset donned and therapist provides total assist for donning pants for time management. Pt transfers sit>stand with min assist and assists with pulling pants over hips on L side. Pt transfers to w/c via stand step transfer, RW & min assist. Transported pt to gym via w/c dependent assist for time management where pt ambulates 40 ft with RW & min assist with very minimal to no R dorsiflexion and hip flexion noted, instead with pt advancing RLE by vaulting on LLE and using core strength to advance RLE. Pt negotiates 8 steps (3") with B rails and mod assist with assistance for advancing & placing RLE up/down step and blocking R knee but no buckling noted when descending stairs. Pt reports increasing pain after task and feeling like he twisted somewhat at bottom of steps (not observed) but reports pain dissipates with rest. Pt propels w/c back to room & is left in w/c with chair alarm donned, all needs at hand, and set up with lunch tray.   Pain: pt reports 0/10 pain at rest, 4/10 with activity that decreases with rest so rest breaks provided PRN.  PT Evaluation Precautions/Restrictions Precautions Precautions: Fall;Back Required Braces or Orthoses: Spinal Brace Spinal Brace: Lumbar corset;Applied in sitting  position Restrictions Weight Bearing Restrictions: No   General Chart Reviewed: Yes Additional Pertinent History: HTN, HLD PT Missed Treatment Reason: Not applicable Response to Previous Treatment: Not applicable Family/Caregiver Present: No    Home Living/Prior Functioning Home Living Available Help at Discharge:  Family;Available 24 hours/day Type of Home: House Home Access: Level entry Home Layout: Multi-level;Able to live on main level with bedroom/bathroom(split level, 1/2 bath downstairs, full bed/bath upstairs) Alternate Level Stairs-Number of Steps: 5 + 5 steps Alternate Level Stairs-Rails: (R ascending rail for first 5 steps, L ascending rail for second 5 steps)  Lives With: Spouse Prior Function Level of Independence: Independent with basic ADLs;Independent with homemaking with ambulation;Independent with gait;Independent with transfers  Able to Take Stairs?: Yes Driving: Yes Vocation: Part time employment Vocation Requirements: driving shuttle car Leisure: Hobbies-yes (Comment) Comments: golf  Vision/Perception  Pt wears glasses at all times at baseline. Pt denies changes in baseline vision.  Cognition Overall Cognitive Status: Within Functional Limits for tasks assessed Arousal/Alertness: Awake/alert Memory: Appears intact Awareness: Appears intact Problem Solving: Appears intact Safety/Judgment: Appears intact   Sensation Sensation Light Touch: Appears Intact(BLE intact to light touch, pt does report numbness in RLE that is slowly improving) Proprioception: Appears Intact(BLE) Coordination Gross Motor Movements are Fluid and Coordinated: No(RLE impaired 2/2 weakness) Fine Motor Movements are Fluid and Coordinated: No(impaired RLE)  Motor  Motor Motor: Abnormal postural alignment and control Motor - Skilled Clinical Observations: generalized deconditioning   Mobility Bed Mobility Bed Mobility: Rolling Right;Rolling Left;Sit to Sidelying Right;Right  Sidelying to Sit(bed flat with bed rails) Rolling Right: Minimal Assistance - Patient > 75% Rolling Left: Minimal Assistance - Patient > 75% Right Sidelying to Sit: Minimal Assistance - Patient > 75% Transfers Transfers: Sit to Stand;Stand to Sit Sit to Stand: Minimal Assistance - Patient > 75% Stand to Sit: Minimal Assistance - Patient > 75% Transfer (Assistive device): Rolling walker  Locomotion  Gait Ambulation: Yes Gait Assistance: Minimal Assistance - Patient > 75% Gait Distance (Feet): 40 Feet Assistive device: Rolling walker Gait Gait: Yes Gait Pattern: Decreased stance time - right;Decreased step length - right;Decreased step length - left;Decreased stride length;Decreased hip/knee flexion - right;Decreased weight shift to right;Poor foot clearance - right Gait velocity: decreased Stairs / Additional Locomotion Stairs: Yes Stairs Assistance: Moderate Assistance - Patient 50 - 74% Stair Management Technique: Two rails Number of Stairs: 8 Height of Stairs: 3(inches) Wheelchair Mobility Wheelchair Mobility: Yes Wheelchair Assistance: Chartered loss adjuster: Both upper extremities Wheelchair Parts Management: Needs assistance Distance: 150 ft    Trunk/Postural Assessment  Cervical Assessment Cervical Assessment: Within Functional Limits Thoracic Assessment Thoracic Assessment: Within Functional Limits Lumbar Assessment Lumbar Assessment: Exceptions to WFL(lumbar corset) Postural Control Postural Control: Deficits on evaluation Righting Reactions: delayed Protective Responses: delayed   Balance Balance Balance Assessed: Yes Static Sitting Balance Static Sitting - Balance Support: Feet supported Static Sitting - Level of Assistance: 6: Modified independent (Device/Increase time) Dynamic Standing Balance Dynamic Standing - Balance Support: Bilateral upper extremity supported;During functional activity(during gait with RW) Dynamic Standing  - Level of Assistance: 4: Min assist  Extremity Assessment  RUE Assessment RUE Assessment: Within Functional Limits LUE Assessment LUE Assessment: Within Functional Limits  RLE Assessment RLE Assessment: Exceptions to [facility] Passive Range of Motion (PROM) Comments: WFL RLE Strength Right Hip Flexion: 1/5 Right Knee Flexion: 1/5 Right Knee Extension: 3/5 Right Ankle Dorsiflexion: 1/5 LLE Assessment LLE Assessment: Exceptions to [facility] LLE Strength Left Hip Flexion: 3/5 Left Knee Flexion: 3/5 Left Knee Extension: 3/5 Left Ankle Dorsiflexion: 3/5    Refer to Care Plan for Long Term Goals  Recommendations for other services: Therapeutic Recreation  Stress management  Discharge Criteria: Patient will be discharged from PT if patient refuses treatment 3 consecutive times without medical reason,  if treatment goals not met, if there is a change in medical status, if patient makes no progress towards goals or if patient is discharged from hospital.  The above assessment, treatment plan, treatment alternatives and goals were discussed and mutually agreed upon: by patient  Waunita Schooner 06/18/2018, 12:39 PM

## 2018-06-18 NOTE — Evaluation (Signed)
Occupational Therapy Assessment and Plan  Patient Details  Name: Joseph Moyer MRN: [id number] Date of Birth: Jan 29, 1941  OT Diagnosis: abnormal posture, lumbago (low back pain) and muscle weakness (generalized) Rehab Potential: Rehab Potential (ACUTE ONLY): Good ELOS: 7-9 days   Today's Date: 06/18/2018 OT Individual Time: 1330-1420 OT Individual Time Calculation (min): 50 min     Problem List:  Patient Active Problem List   Diagnosis Date Noted  . 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 S/P lumbar fusion 06/15/2018  . 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 Overweight (BMI 25.0-29.9) 07/31/2014  . Acute diverticulitis 07/30/2014  . Elevated BP 07/30/2014  . Hyponatremia 07/30/2014  . Hyperlipidemia 07/30/2014  . HTN (hypertension) 07/30/2014    Past Medical History:  Past Medical History:  Diagnosis Date  . Allergy   . Arthritis   . Hyperlipidemia   . Hypertension    Past Surgical History:  Past Surgical History:  Procedure Laterality Date  . CIRCUMCISION    . COLONOSCOPY WITH PROPOFOL N/A 05/10/2015   Procedure: COLONOSCOPY WITH PROPOFOL;  Surgeon: Garlan Fair, MD;  Location: [facility];  Service: Endoscopy;  Laterality: N/A;  . DECOMPRESSIVE LUMBAR LAMINECTOMY LEVEL 1 N/A 06/15/2018   Procedure: Gordy Levan decompression L1-L2/ complete inferior facetectomyof L1. Posterior lateral arthrodesiswith autograft bone L1-L2.;  Surgeon: Melina Schools, MD;  Location: [place];  Service: Orthopedics;  Laterality: N/A;  . HEMORRHOID SURGERY      Assessment & Plan Clinical Impression: Patient is a 78 y.o. year old male with history of hypertension hyperlipidemia. Per chart review patient lives with spouse. Independent prior to admission. Multilevel home. Presented 06/14/2018 with increasing low back pain radiating to the lower extremities right greater than left times several weeks after lifting a bag of fertilizer. He denied any bowel or bladder disturbances. 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 Left lateral recess stenosis L2-3. Underwent bilateral L1-2 discectomy with left hemilaminotomy and lateral recess decompression L2-3 06/15/2018 per Dr. Rolena Infante. Hospital course pain management. Lumbar corset when out of bed. Subcutaneous Lovenox for DVT prophylaxis. Acute blood loss anemia 10.3.    Patient transferred to CIR on 06/17/2018 .    Patient currently requires mod with basic self-care skills secondary to muscle weakness, muscle joint tightness and muscle paralysis and decreased standing balance, decreased postural control and decreased balance strategies.  Prior to hospitalization, patient could complete adl with independent .  Patient will benefit from skilled intervention to decrease level of assist with basic self-care skills and increase independence with basic self-care skills prior to discharge home with care partner.  Anticipate patient will require intermittent supervision and follow up home health.  OT - End of Session Activity Tolerance: Tolerates 10 - 20 min activity with multiple rests Endurance Deficit: Yes Endurance Deficit Description: poor endurance for activity PM session OT Assessment Rehab Potential (ACUTE ONLY): Good OT Patient demonstrates impairments in the following area(s): Balance;Endurance;Motor OT Basic ADL's Functional Problem(s): Bathing;Dressing;Toileting OT Advanced ADL's Functional Problem(s): Simple Meal Preparation OT Transfers Functional Problem(s): Toilet;Tub/Shower OT Plan OT Intensity: Minimum of 1-2 x/day, 45 to 90 minutes OT Frequency: 5 out of 7 days OT Duration/Estimated Length of Stay: 7-9 days OT Treatment/Interventions: Balance/vestibular training;Discharge planning;Pain management;Self Care/advanced ADL retraining;Therapeutic Activities;Functional mobility training;Patient/family education;Therapeutic Exercise;DME/adaptive equipment  instruction;UE/LE Strength taining/ROM OT Self Feeding Anticipated Outcome(s): independent OT Basic Self-Care Anticipated Outcome(s): mod I OT Toileting Anticipated Outcome(s): mod I OT Bathroom Transfers Anticipated Outcome(s): CS/mod I OT Recommendation Patient destination: Home  Follow Up Recommendations: Home health OT Equipment Recommended: Tub/shower bench Equipment Details: will need to remove shower doors to accomodate tub bench   Skilled Therapeutic Intervention Patient seated in w/c ready for therapy session.  He is pleasant and cooperative t/o evaluation process.  Requests to use the bathroom at start of session - SPT without RW mod a with poor posture.  He requires mod a for pants up/down and hygiene but unable to have a BM.  SPT toilet to w/c with RW min A and min cues for technique (patient is encouraged to use RW for all functional transfers due to right LE weakness and balance deficit).  Evaluation completed as documented below.  Reviewed role of OT, schedule, therapy plan of care, goals for therapy.  He verbalizes good understanding.  He declined shower at this time.  Completed sponge bath, grooming and dressing tasks at w/c level.  He will benefit from assistive devices and training to reach lower legs safely following back precautions.  Min A for don/doff of LSO.  Patient able to propel w/c on unit with CS.  Reviewed tub shower technique and DME options for the shower - he has a tub/shower with doors that would need to be removed in order to accommodate the tub bench.  He declined practicing at this time due to fatigue.  Patient returned to bed at close of session with min A for SPT w/c to bed with RW and mod A SSP to supine.  Bed alarm set, call bell and phone in reach.    OT Evaluation Precautions/Restrictions  Precautions Precautions: Fall;Back Required Braces or Orthoses: Spinal Brace Spinal Brace: Lumbar corset;Applied in sitting position Restrictions Weight Bearing  Restrictions: No General Chart Reviewed: Yes Vital Signs Therapy Vitals Temp: 99.5 F (37.5 C) Temp Source: Oral Pulse Rate: (!) 109 Resp: 18 BP: 130/74 Patient Position (if appropriate): Lying Oxygen Therapy SpO2: 90 % O2 Device: Room Air Pain Pain Assessment Pain Scale: 0-10 Pain Score: 1  Faces Pain Scale: No hurt Pain Location: Back Pain Descriptors / Indicators: Aching Pain Intervention(s): Repositioned Home Living/Prior Functioning Home Living Family/patient expects to be discharged to:: Private residence Living Arrangements: Spouse/significant other Available Help at Discharge: Family, Available 24 hours/day Type of Home: House Home Access: Level entry Home Layout: Multi-level, Able to live on main level with bedroom/bathroom(half bath on main level, bed and full bath on 2nd level) Alternate Level Stairs-Number of Steps: 5 + 5 steps Alternate Level Stairs-Rails: (R ascending rail for first 5 steps, L ascending rail for second 5 steps) Bathroom Shower/Tub: Chiropodist: Standard  Lives With: Spouse Prior Function Level of Independence: Independent with basic ADLs, Independent with homemaking with ambulation, Independent with gait, Independent with transfers  Able to Take Stairs?: Yes Driving: Yes Vocation: Part time employment Vocation Requirements: driving shuttle car Leisure: Hobbies-yes (Comment) Comments: golf ADL ADL Eating: Independent Where Assessed-Eating: Wheelchair Grooming: Setup Where Assessed-Grooming: Sitting at sink, Wheelchair Upper Body Bathing: Setup Where Assessed-Upper Body Bathing: Sitting at sink, Wheelchair Lower Body Bathing: Moderate assistance Where Assessed-Lower Body Bathing: Sitting at sink, Wheelchair, Standing at sink Upper Body Dressing: Minimal assistance Where Assessed-Upper Body Dressing: Wheelchair Lower Body Dressing: Moderate assistance Where Assessed-Lower Body Dressing: Wheelchair Toileting:  Maximal assistance Where Assessed-Toileting: Glass blower/designer: Moderate assistance Toilet Transfer Method: Stand pivot Toilet Transfer Equipment: Raised toilet seat, Grab bars Tub/Shower Transfer: Not assessed ADL Comments: patient declined shower, completed sponge bath seated at sink Vision Baseline Vision/History: Wears glasses Wears Glasses: At  all times Patient Visual Report: No change from baseline Vision Assessment?: Yes Eye Alignment: Within Functional Limits Ocular Range of Motion: Within Functional Limits Alignment/Gaze Preference: Within Defined Limits Tracking/Visual Pursuits: Able to track stimulus in all quads without difficulty Saccades: Within functional limits Convergence: Within functional limits Visual Fields: No apparent deficits Perception  Perception: Within Functional Limits Praxis Praxis: Intact Cognition Overall Cognitive Status: Within Functional Limits for tasks assessed Arousal/Alertness: Awake/alert Orientation Level: Person;Place;Situation Person: Oriented Place: Oriented Situation: Oriented Year: 2020 Month: May Day of Week: Correct Memory: Appears intact Immediate Memory Recall: Sock;Blue;Bed Memory Recall: Sock;Blue;Bed Memory Recall Sock: Without Cue Memory Recall Blue: Without Cue Memory Recall Bed: Without Cue Attention: Sustained Sustained Attention: Appears intact Awareness: Appears intact Problem Solving: Appears intact Safety/Judgment: Appears intact Sensation Sensation Light Touch: Appears Intact Coordination Fine Motor Movements are Fluid and Coordinated: Yes Finger Nose Finger Test: [facility] Motor  Motor Motor - Skilled Clinical Observations: poor upright posture with funcitonal transfers PM session - fatigue and weakness factors Mobility  Bed Mobility Bed Mobility: Sit to Sidelying Right Sit to Sidelying Right: Moderate Assistance - Patient 50-74% Transfers Sit to Stand: Moderate Assistance - Patient 50-74% Stand  to Sit: Minimal Assistance - Patient > 75%  Trunk/Postural Assessment  Postural Control Postural Control: Deficits on evaluation  Balance Dynamic Standing Balance Dynamic Standing - Balance Support: During functional activity Dynamic Standing - Level of Assistance: 3: Mod assist Extremity/Trunk Assessment RUE Assessment RUE Assessment: Within Functional Limits General Strength Comments: 5/5 LUE Assessment LUE Assessment: Within Functional Limits General Strength Comments: 5/5     Refer to Care Plan for Long Term Goals  Recommendations for other services: None    Discharge Criteria: Patient will be discharged from OT if patient refuses treatment 3 consecutive times without medical reason, if treatment goals not met, if there is a change in medical status, if patient makes no progress towards goals or if patient is discharged from hospital.  The above assessment, treatment plan, treatment alternatives and goals were discussed and mutually agreed upon: by patient  Carlos Levering 06/18/2018, 4:10 PM

## 2018-06-19 ENCOUNTER — Inpatient Hospital Stay (HOSPITAL_COMMUNITY): Payer: Medicare HMO | Admitting: Physical Therapy

## 2018-06-19 ENCOUNTER — Inpatient Hospital Stay (HOSPITAL_COMMUNITY): Payer: Medicare HMO | Admitting: Occupational Therapy

## 2018-06-19 ENCOUNTER — Inpatient Hospital Stay (HOSPITAL_COMMUNITY): Payer: Medicare HMO

## 2018-06-19 MED ORDER — SENNOSIDES-DOCUSATE SODIUM 8.6-50 MG PO TABS
2.0000 | ORAL_TABLET | Freq: Every day | ORAL | Status: DC
  Administered 2018-06-19 – 2018-06-25 (×5): 2 via ORAL
  Filled 2018-06-19 (×6): qty 2

## 2018-06-19 NOTE — Progress Notes (Signed)
Occupational Therapy Session Note  Patient Details  Name: Joseph Moyer MRN: [id number] Date of Birth: 1940/12/21  Today's Date: 06/19/2018 OT Individual Time: 1003-1104 OT Individual Time Calculation (min): 61 min    Short Term Goals: Week 1:  OT Short Term Goal 1 (Week 1): na STG = LTG  Skilled Therapeutic Interventions/Progress Updates:    Pt worked on shower and dressing for therapy session.  He was able to complete transfer from the bed to the 3:1 over the toilet with min assist using the RW.  No lumbar corsett applied secondary to orders stating pt can go to the bathroom and shower with use of the RW.  He was able to complete toileting with min assist sit to stand before transferring over to the shower bench with min assist.  He completed all bathing with min assist in sitting.  Therapist assisted with washing his back and the lower legs secondary to not having access to a LH sponge at this time.  He then transferred out to the sink for grooming task of shaving his head and face with setup.  Max assist for LB dressing secondary to not having access to AE.  Supervision for UB dressing.  Finished session with pt in the wheelchair at bedside with call button and phone in reach and safety alarm belt in place.  Pt able to recall 3/3 back precautions during session with min questioning cueing as well.    Therapy Documentation Precautions:  Precautions Precautions: Fall, Back Required Braces or Orthoses: Spinal Brace Spinal Brace: Lumbar corset, Applied in sitting position Restrictions Weight Bearing Restrictions: No   Pain: Pain Assessment Pain Scale: Faces Pain Score: 4  Faces Pain Scale: Hurts a little bit Pain Type: Surgical pain Pain Location: Back Pain Orientation: Lower Pain Intervention(s): Refused ADL: See Care Tool For some details of ADL    Therapy/Group: Individual Therapy  Elisandra Deshmukh OTR/L 06/19/2018, 11:29 AM

## 2018-06-19 NOTE — Progress Notes (Signed)
[facility] PHYSICAL MEDICINE & REHABILITATION PROGRESS NOTE   Subjective/Complaints: No new issues today. Had BM and feels better  ROS: Patient denies fever, rash, sore throat, blurred vision, nausea, vomiting, diarrhea, cough, shortness of breath or chest pain,  headache, or mood change.    Objective:   Vas [place] Lower Extremity Venous (dvt)  Result Date: 06/18/2018  Lower Venous Study Indications: Swelling.  Performing Technologist: Oliver Hum RVT  Examination Guidelines: A complete evaluation includes B-mode imaging, spectral Doppler, color Doppler, and power Doppler as needed of all accessible portions of each vessel. Bilateral testing is considered an integral part of a complete examination. Limited examinations for reoccurring indications may be performed as noted.  +---------+---------------+---------+-----------+----------+-------+ RIGHT    CompressibilityPhasicitySpontaneityPropertiesSummary +---------+---------------+---------+-----------+----------+-------+ CFV      Full           Yes      Yes                          +---------+---------------+---------+-----------+----------+-------+ SFJ      Full                                                 +---------+---------------+---------+-----------+----------+-------+ FV Prox  Full                                                 +---------+---------------+---------+-----------+----------+-------+ FV Mid   Full                                                 +---------+---------------+---------+-----------+----------+-------+ FV DistalFull                                                 +---------+---------------+---------+-----------+----------+-------+ PFV      Full                                                 +---------+---------------+---------+-----------+----------+-------+ POP      Full           Yes      Yes                           +---------+---------------+---------+-----------+----------+-------+ PTV      Full                                                 +---------+---------------+---------+-----------+----------+-------+ PERO     Full                                                 +---------+---------------+---------+-----------+----------+-------+   +---------+---------------+---------+-----------+----------+-------+  LEFT     CompressibilityPhasicitySpontaneityPropertiesSummary +---------+---------------+---------+-----------+----------+-------+ CFV      Full           Yes      Yes                          +---------+---------------+---------+-----------+----------+-------+ SFJ      Full                                                 +---------+---------------+---------+-----------+----------+-------+ FV Prox  Full                                                 +---------+---------------+---------+-----------+----------+-------+ FV Mid   Full                                                 +---------+---------------+---------+-----------+----------+-------+ FV DistalFull                                                 +---------+---------------+---------+-----------+----------+-------+ PFV      Full                                                 +---------+---------------+---------+-----------+----------+-------+ POP      Full           Yes      Yes                          +---------+---------------+---------+-----------+----------+-------+ PTV      Full                                                 +---------+---------------+---------+-----------+----------+-------+ PERO     Full                                                 +---------+---------------+---------+-----------+----------+-------+     Summary: Right: There is no evidence of deep vein thrombosis in the lower extremity. No cystic structure found in the popliteal fossa. Left: There is  no evidence of deep vein thrombosis in the lower extremity. No cystic structure found in the popliteal fossa.  *See table(s) above for measurements and observations. Electronically signed by Harold Barban MD on 06/18/2018 at 2:50:18 PM.    Final    Recent Labs    06/18/18 0514  WBC 5.8  HGB 9.6*  HCT 29.5*  PLT 192   Recent Labs    06/18/18 0514  NA 133*  K 4.0  CL 95*  CO2 26  GLUCOSE 116*  BUN 15  CREATININE 0.87  CALCIUM 8.5*    Intake/Output Summary (Last 24 hours) at 06/19/2018 1236 Last data filed at 06/19/2018 0900 Gross per 24 hour  Intake 476 ml  Output 800 ml  Net -324 ml     Physical Exam: Vital Signs Blood pressure 135/82, pulse 94, temperature 98.8 F (37.1 C), temperature source Oral, resp. rate 17, height 5\' 11"  (1.803 m), weight 86.1 kg, SpO2 97 %. Constitutional: No distress . Vital signs reviewed. HEENT: EOMI, oral membranes moist Neck: supple Cardiovascular: RRR without murmur. No JVD    Respiratory: CTA Bilaterally without wheezes or rales. Normal effort    GI: BS +, non-tender, non-distended  Musculoskeletal:  General: No edema.  Comments: LB TTP, in LSO. Neurological: He is alertand oriented to person, place, and time. Patient is alert in no acute distress. Follows full commands. UE 5/5. RLE: HF 2 to 2+, KE 1+  , ADF 3, APF 4/5, KF 4/5, HE 4/5. LLE:   4/5.---some improvement in motor exam.  Decreased sensation over primarily over right L2, L3 dermatomes remains present. DTR's absent in LE's.  Skin: Skin iswarm. He isnot diaphoretic. Back incision remains clean and dry Psychiatric: pleasant   Assessment/Plan: 1. 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  Physiatrist is providing close team supervision and 24 hour management of active medical problems listed below.  Physiatrist and rehab team  continue to assess barriers to discharge/monitor patient progress toward functional and medical goals  Care Tool:  Bathing  Bathing activity did not occur: Refused Body parts bathed by patient: Right arm, Left arm, Chest, Abdomen, Front perineal area, Face, Right upper leg, Left upper leg, Buttocks   Body parts bathed by helper: Right lower leg, Left lower leg     Bathing assist Assist Level: Minimal Assistance - Patient > 75%     Upper Body Dressing/Undressing Upper body dressing   What is the patient wearing?: Pull over shirt, Orthosis    Upper body assist Assist Level: Minimal Assistance - Patient > 75%    Lower Body Dressing/Undressing Lower body dressing      What is the patient wearing?: Pants     Lower body assist Assist for lower body dressing: Moderate Assistance - Patient 50 - 74%     Toileting Toileting Toileting Activity did not occur Landscape architect and hygiene only): Safety/medical concerns  Toileting assist Assist for toileting: Minimal Assistance - Patient > 75%     Transfers Chair/bed transfer  Transfers assist  Chair/bed transfer activity did not occur: Safety/medical concerns  Chair/bed transfer assist level: Minimal Assistance - Patient > 75%     Locomotion Ambulation   Ambulation assist   Ambulation activity did not occur: Safety/medical concerns  Assist level: Minimal Assistance - Patient > 75% Assistive device: Walker-rolling Max distance: 10'   Walk 10 feet activity   Assist  Walk 10 feet activity did not occur: Safety/medical concerns  Assist level: Minimal Assistance - Patient > 75% Assistive device: Walker-rolling   Walk 50 feet activity   Assist Walk 50 feet with 2 turns activity did not occur: Safety/medical concerns         Walk 150 feet activity   Assist Walk 150 feet activity did not occur: Safety/medical concerns         Walk 10 feet on uneven surface  activity   Assist  Walk 10 feet on uneven  surfaces activity did not occur: Safety/medical concerns         Wheelchair     Assist Will patient use wheelchair at discharge?: Yes Type of Wheelchair: Manual    Wheelchair assist level: Supervision/Verbal cueing Max wheelchair distance: 100 ft     Wheelchair 50 feet with 2 turns activity    Assist        Assist Level: Supervision/Verbal cueing   Wheelchair 150 feet activity     Assist     Assist Level: Supervision/Verbal cueing    Medical Problem List and Plan: 1.Decreased functional mobilitysecondary to severemultifactorialspinal stenosis 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 Status post L1-2 bilateral discectomy with left hemilaminotomy 06/15/2018. Lumbar corset when out of bed -Continue CIR therapies including PT, OT   -showing some improvement in RLE strength  -will need orthotic AFO vs Knee brace, vs both 2. Antithrombotics: -DVT/anticoagulation:Subcutaneous Lovenox. dopplers negative -antiplatelet therapy: N/A 3. Pain Management:Neurontin 300 mg 3 times daily, Robaxin and oxycodone as needed 4. Mood:Provide emotional support -antipsychotic agents: N/A 5. Neuropsych: This patientiscapable of making decisions on hisown behalf. 6. Skin/Wound Care:Routine skin checks 7. Fluids/Electrolytes/Nutrition:encourage PO    8. Acute blood loss anemia. hgb sl down to 9.6  -no overt signs of blood loss  -follow up next week 9.Hypertension. Patient on Norvasc 5 mg daily prior to admission. Resume as needed 10. BPH. Resumed Flomax 0.4 mg daily. -pt states he's been voiding without significant difficulty 11. Hyperlipidemia. Resume Zocor 12. Constipation vs Neurogenic bowel: continue to augment bowel regimen    -+BM 5/19   -daily bowel regimen  LOS: 2 days A FACE TO FACE EVALUATION WAS PERFORMED  Meredith Staggers 06/19/2018, 12:36 PM

## 2018-06-19 NOTE — Plan of Care (Signed)
  Problem: Consults Goal: RH SPINAL CORD INJURY PATIENT EDUCATION Description  See Patient Education module for education specifics.  Outcome: Progressing Goal: Skin Care Protocol Initiated - if Braden Score 18 or less Description If consults are not indicated, leave blank or document N/A Outcome: Progressing   Problem: SCI BOWEL ELIMINATION Goal: RH STG MANAGE BOWEL WITH ASSISTANCE Description STG Manage Bowel with mod I Assistance.  Outcome: Progressing Flowsheets (Taken 06/19/2018 1440) STG: Pt will manage bowels with assistance: 3-Moderate assistance Goal: RH STG SCI MANAGE BOWEL WITH MEDICATION WITH ASSISTANCE Description STG SCI Manage bowel with medication with mod I assistance.  Outcome: Progressing   Problem: RH SKIN INTEGRITY Goal: RH STG ABLE TO PERFORM INCISION/WOUND CARE W/ASSISTANCE Description STG Able To Perform Incision/Wound Care With min Assistance.  Outcome: Progressing Flowsheets (Taken 06/19/2018 1440) STG: Pt will be able to perform incision/wound care with assistance: 3-Moderate assistance   Problem: RH SAFETY Goal: RH STG ADHERE TO SAFETY PRECAUTIONS W/ASSISTANCE/DEVICE Description STG Adhere to Safety Precautions With cues/supervision Assistance/Device.  Outcome: Progressing Flowsheets (Taken 06/19/2018 1440) STG:Pt will adhere to safety precautions with assistance/device: 3-Moderate assistance   Problem: RH PAIN MANAGEMENT Goal: RH STG PAIN MANAGED AT OR BELOW PT'S PAIN GOAL Description At or below level 5  Outcome: Progressing   Problem: RH KNOWLEDGE DEFICIT SCI Goal: RH STG INCREASE KNOWLEDGE OF SELF CARE AFTER SCI Description Pt will be able to direct care at discharge independently using handouts/educational resources independently  Outcome: Progressing

## 2018-06-19 NOTE — Progress Notes (Signed)
Social Work  Social Work Assessment and Plan  Patient Details  Name: Joseph Moyer MRN: [id number] Date of Birth: December 24, 1940  Today's Date: 06/19/2018  Problem List:  Patient Active Problem List   Diagnosis Date Noted  . 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 S/P lumbar fusion 06/15/2018  . 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 Overweight (BMI 25.0-29.9) 07/31/2014  . Acute diverticulitis 07/30/2014  . Elevated BP 07/30/2014  . Hyponatremia 07/30/2014  . Hyperlipidemia 07/30/2014  . HTN (hypertension) 07/30/2014   Past Medical History:  Past Medical History:  Diagnosis Date  . Allergy   . Arthritis   . Hyperlipidemia   . Hypertension    Past Surgical History:  Past Surgical History:  Procedure Laterality Date  . CIRCUMCISION    . COLONOSCOPY WITH PROPOFOL N/A 05/10/2015   Procedure: COLONOSCOPY WITH PROPOFOL;  Surgeon: Garlan Fair, MD;  Location: [facility];  Service: Endoscopy;  Laterality: N/A;  . DECOMPRESSIVE LUMBAR LAMINECTOMY LEVEL 1 N/A 06/15/2018   Procedure: Gordy Levan decompression L1-L2/ complete inferior facetectomyof L1. Posterior lateral arthrodesiswith autograft bone L1-L2.;  Surgeon: Melina Schools, MD;  Location: [facility];  Service: Orthopedics;  Laterality: N/A;  . HEMORRHOID SURGERY     Social History:  reports that he quit smoking about 26 years ago. He has never used smokeless tobacco. He reports current alcohol use. He reports that he does not use drugs.  Family / Support Systems Marital Status: Married Patient Roles: Spouse Spouse/Significant Other: spouse, Joseph Moyer @ (H) [phone] or (C) [phone] Anticipated Caregiver: wife Ability/Limitations of Caregiver: none Caregiver Availability: 24/7 Family Dynamics: Pt describes wife as very supportive and able to provide any needed assist.  Social History Preferred language: English Religion: Baptist Cultural Background: NA Read: Yes Write: Yes Employment Status: Retired Date  Retired/Disabled/Unemployed: None Public relations account executive Issues: None  Guardian/Conservator: None -  per MD, pt is capable of making decisions on his own behalf.   Abuse/Neglect Abuse/Neglect Assessment Can Be Completed: Yes Physical Abuse: Denies Verbal Abuse: Denies Sexual Abuse: Denies Exploitation of patient/patient's resources: Denies Self-Neglect: Denies  Emotional Status Pt's affect, behavior and adjustment status: Pt pleasant and able to complete assessment interview without any difficulty.  His answers are very direct and brief as he expresses hope to d/c sooner that team anticipates.  Pt denies any emotional distress. Recent Psychosocial Issues: None Psychiatric History: None Substance Abuse History: None  Patient / Family Perceptions, Expectations & Goals Pt/Family understanding of illness & functional limitations: Pt and wife with good understanding of surgery performed and current physical limitations/ need for CIR. Premorbid pt/family roles/activities: Pt independent overall Anticipated changes in roles/activities/participation: Little change anticipated if pt abel to reach supervision/ independent goals. Pt/family expectations/goals: "I want to get home earlier than they are saying."  [employer]: None Premorbid Home Care/DME Agencies: None Transportation available at discharge: yes  Discharge Planning Living Arrangements: Spouse/significant other Support Systems: Spouse/significant other Type of Residence: Private residence Insurance Resources: Chiropractor) Museum/gallery curator Resources: Radio broadcast assistant Screen Referred: No Living Expenses: Medical laboratory scientific officer Management: Spouse, Patient Does the patient have any problems obtaining your medications?: No Home Management: pt and wife share responsibilities Patient/Family Preliminary Plans: Pt to return home with wife able to provide assistance as needed. Social Work  Anticipated Follow Up Needs: HH/OP Expected length of stay: ELOS 7 to 10 days  Clinical Impression Very direct, pleasant gentleman here following lumbar surgery.  Eager to d/c home as soon as he can.  Good support available  form wife.  Pt denies any emotional distress or concerns.  Will follow for d/c planning needs.  Joseph Moyer 06/19/2018, 2:10 PM

## 2018-06-19 NOTE — Progress Notes (Signed)
Occupational Therapy Session Note  Patient Details  Name: Joseph Moyer MRN: [id number] Date of Birth: 11/12/1940  Today's Date: 06/19/2018 OT Individual Time: 1500-1600 OT Individual Time Calculation (min): 60 min    Short Term Goals: Week 1:  OT Short Term Goal 1 (Week 1): na STG = LTG  Skilled Therapeutic Interventions/Progress Updates:    1;1. Pt received in bed ready to go with "a little pain" in the back. Pt completes stand pivot transfer with MOD A and RW after donning brace EOB<>w/c<>EOM. Pt completes w/c mobility to/from all tx destinations with supervision to improve BUE strength/endurance required for BADLs. Pt completes sidlelying therex for RLE in prep for sit to stands required for BADls with powderboard between LEs: hip flex/ext, knee flex/ext, hip ab/adduct 3x10. Pt educated on AE use to doff/don socks with reacher and sock aide. Pt able to return demo with sueprvision. Discussed family bringing in clothing to practice with to also modify shoes with elastic laces. Exited session with pt setaed in w/c, call lightin reach and all eneds met  Therapy Documentation Precautions:  Precautions Precautions: Fall, Back Required Braces or Orthoses: Spinal Brace Spinal Brace: Lumbar corset, Applied in sitting position Restrictions Weight Bearing Restrictions: No General:   Vital Signs: Therapy Vitals Temp: 98.6 F (37 C) Temp Source: Oral Pulse Rate: 99 Resp: 18 BP: 129/67 Patient Position (if appropriate): Lying Oxygen Therapy SpO2: 97 % O2 Device: Room Air Pain: Pain Assessment Pain Scale: 0-10 Pain Score: Asleep Pain Type: Surgical pain Pain Location: Back Pain Orientation: Lower Pain Intervention(s): Medication (See eMAR) ADL: ADL Eating: Independent Where Assessed-Eating: Wheelchair Grooming: Setup Where Assessed-Grooming: Sitting at sink, Wheelchair Upper Body Bathing: Setup Where Assessed-Upper Body Bathing: Sitting at sink, Wheelchair Lower Body  Bathing: Moderate assistance Where Assessed-Lower Body Bathing: Sitting at sink, Wheelchair, Standing at sink Upper Body Dressing: Minimal assistance Where Assessed-Upper Body Dressing: Wheelchair Lower Body Dressing: Moderate assistance Where Assessed-Lower Body Dressing: Wheelchair Toileting: Maximal assistance Where Assessed-Toileting: Glass blower/designer: Moderate assistance Toilet Transfer Method: Stand pivot Toilet Transfer Equipment: Raised toilet seat, Grab bars Tub/Shower Transfer: Not assessed ADL Comments: patient declined shower, completed sponge bath seated at sink Vision   Perception    Praxis   Exercises:   Other Treatments:     Therapy/Group: Individual Therapy  Tonny Branch 06/19/2018, 3:17 PM

## 2018-06-19 NOTE — Care Management (Signed)
[facility] Individual Statement of Services  Patient Name:  Joseph Moyer  Date:  06/19/2018  Welcome to the [facility].  Our goal is to provide you with an individualized program based on your diagnosis and situation, designed to meet your specific needs.  With this comprehensive rehabilitation program, you will be expected to participate in at least 3 hours of rehabilitation therapies Monday-Friday, with modified therapy programming on the weekends.  Your rehabilitation program will include the following services:  Physical Therapy (PT), Occupational Therapy (OT), 24 hour per day rehabilitation nursing, Case Management (Social Worker), Rehabilitation Medicine, Nutrition Services and Pharmacy Services  Weekly team conferences will be held on Tuesdays to discuss your progress.  Your Social Worker will talk with you frequently to get your input and to update you on team discussions.  Team conferences with you and your family in attendance may also be held.  Expected length of stay: 7-10 days   Overall anticipated outcome: supervision to independent  Depending on your progress and recovery, your program may change. Your Social Worker will coordinate services and will keep you informed of any changes. Your Social Worker's name and contact numbers are listed  below.  The following services may also be recommended but are not provided by the [facility] will be made to provide these services after discharge if needed.  Arrangements include referral to agencies that provide these services.  Your insurance has been verified to be:  [employer] Your primary doctor is:  [name]  Pertinent information will be shared with your doctor and your insurance company.  Social Worker:  [place], [place] or ([phone]    Information discussed with and copy given to patient by: Lennart Pall, 06/19/2018, 2:11 PM

## 2018-06-20 ENCOUNTER — Inpatient Hospital Stay (HOSPITAL_COMMUNITY): Payer: Medicare HMO

## 2018-06-20 ENCOUNTER — Inpatient Hospital Stay (HOSPITAL_COMMUNITY): Payer: Medicare HMO | Admitting: Occupational Therapy

## 2018-06-20 MED ORDER — POLYETHYLENE GLYCOL 3350 17 G PO PACK
17.0000 g | PACK | Freq: Every day | ORAL | Status: DC
  Administered 2018-06-20 – 2018-06-25 (×6): 17 g via ORAL
  Filled 2018-06-20 (×6): qty 1

## 2018-06-20 NOTE — Progress Notes (Addendum)
Physical Therapy Session Note  Patient Details  Name: Joseph Moyer MRN: [id number] Date of Birth: [birth year]  Today's Date: 06/20/2018 PT Individual Time: 1410-1455 PT Individual Time Calculation (min): 45 min   Short Term Goals: Week 1:  PT Short Term Goal 1 (Week 1): STG = LTG due to short ELOS.  Skilled Therapeutic Interventions/Progress Updates:     Patient in bed upon PT arrival. Patient alert and agreeable to PT session.  Therapeutic Activity: Bed Mobility: Patient performed supine to/from sit with supervision. Provided verbal cues for log rolling to maintain spinal precautions. Transfers: Patient performed sit to/from stand x3 and stand pivot transfers using the RW with min A-CGA. Provided verbal cues for hand placement and bending at the hips rather than the back to maintain spinal precautions.  Gait Training:  Patient ambulated 100 feet using RW with CGA and w/c follow. Ambulated with decreased gait speed, decreased step length and stance time on R with intermittent step-to gait pattern leading with R, increased hip and knee flexion, forward trunk lean, and downward head gaze. Provided verbal cues for erect posture, looking ahead, and decreased knee flexion in stance on R.  Wheelchair Mobility:  Patient propelled wheelchair 100 feet with supervision for safety. Provided verbal cues for and min a for management of leg rest and breaks throughout session.   Neuromuscular Re-ed: Patient performed hip flexion and knee flexion/extension in sitting on R with manual facilitation for full ROM and muscle activation. Performed sit to/from stand with R foot slightly behind the L for increased muscle activation, weight shift, and strengthening of R LE. Patient c/o of back pain after third trial, adjusted activity to mini squats x4 without any relief despite PT providing demonstration and cues for keeping a flat back and activation of abdominal muscles during activity.   Patient in bed  at end of session with breaks locked, bed alarm set, and all needs within reach. Patient agreeable to getting up for dinner and sitting OOB following education on benefits of being OOB during the day.   Therapy Documentation Precautions:  Precautions Precautions: Fall, Back Required Braces or Orthoses: Spinal Brace Spinal Brace: Lumbar corset, Applied in sitting position Restrictions Weight Bearing Restrictions: No Pain: Patient reported no pain at beginning of session and 5/10 back pain during session, RN aware and PT provided repositioning and distraction throughout session for pain interventions.   Therapy/Group: Individual Therapy  Maycie Luera L Kaile Bixler PT, DPT  06/20/2018, 4:13 PM

## 2018-06-20 NOTE — Progress Notes (Signed)
Resting at interval ,medicated x2 for c;o surgical back pain rate pain 7-8/10 on  Pain scale, no additional drainage to surgical dressing noted, continue regime, call bell and alarm in place

## 2018-06-20 NOTE — Progress Notes (Signed)
Occupational Therapy Session Note  Patient Details  Name: Joseph Moyer MRN: [id number] Date of Birth: October 06, 1940  Today's Date: 06/20/2018 OT Individual Time: 3094-0768 OT Individual Time Calculation (min): 57 min    Short Term Goals: Week 1:  OT Short Term Goal 1 (Week 1): na STG = LTG  Skilled Therapeutic Interventions/Progress Updates:    1:1. Pt reporting 7/10 back pain but premedicated. Pt completes sit to stand from elevated bed with MIN A with RW and CGA to ambualte into bathroom. Pt transfers onto TTB with VC for RW managmetn and MIN A. Pt able to doff clothing with reacher for LB. Pt bathes seated leaning laterally to wash buttocks with supervision, but min A provided d/t no LHSS and OT washes B feet. Pt completes dressing EOB with MIN A for sit to stand from w/c for LB using reacher and sock aide. Pt completes grooming in standing at sink with supervision for static standing balance. Pt completes ambulatory transfer with RW to TTB with VC for sequencing and CGA. OT educated pt on adaptations for shower to improve safety. Exited session with pt seated in w/c, call light in reach and all needs met  Therapy Documentation Precautions:  Precautions Precautions: Fall, Back Required Braces or Orthoses: Spinal Brace Spinal Brace: Lumbar corset, Applied in sitting position Restrictions Weight Bearing Restrictions: No General:   Vital Signs:  Pain:   ADL: ADL Eating: Independent Where Assessed-Eating: Wheelchair Grooming: Setup Where Assessed-Grooming: Sitting at sink, Wheelchair Upper Body Bathing: Setup Where Assessed-Upper Body Bathing: Sitting at sink, Wheelchair Lower Body Bathing: Moderate assistance Where Assessed-Lower Body Bathing: Sitting at sink, Wheelchair, Standing at sink Upper Body Dressing: Minimal assistance Where Assessed-Upper Body Dressing: Wheelchair Lower Body Dressing: Moderate assistance Where Assessed-Lower Body Dressing: Wheelchair Toileting:  Maximal assistance Where Assessed-Toileting: Glass blower/designer: Moderate assistance Toilet Transfer Method: Stand pivot Toilet Transfer Equipment: Raised toilet seat, Grab bars Tub/Shower Transfer: Not assessed ADL Comments: patient declined shower, completed sponge bath seated at sink Vision   Perception    Praxis   Exercises:   Other Treatments:     Therapy/Group: Individual Therapy  Tonny Branch 06/20/2018, 10:03 AM

## 2018-06-20 NOTE — IPOC Note (Signed)
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[facility])     Functional Problem List: Nursing Bowel, Pain, Medication Management, Endurance, Safety, Skin Integrity  PT Balance, Skin Integrity, Pain, Endurance, Safety, Motor, Sensory  OT Balance, Endurance, Motor  SLP    TR         Basic ADL's: OT Bathing, Dressing, Toileting     Advanced  ADL's: OT Simple Meal Preparation     Transfers: PT Bed Mobility, Bed to Chair, Car, Manufacturing systems engineer, Metallurgist: PT Ambulation, Emergency planning/management officer, Stairs     Additional Impairments: OT    SLP        TR      Anticipated Outcomes Item Anticipated Outcome  Self Feeding independent  Swallowing      Basic self-care  mod I  Toileting  mod I   Bathroom Transfers CS/mod I  Bowel/Bladder  manage bowel with mod I assist  Transfers  supervision with LRAD  Locomotion  supervision with LRAD  Communication     Cognition     Pain  pain at or below level 5  Safety/Judgment  maintain safety with cues/supervision   Therapy Plan: PT Intensity: Minimum of 1-2 x/day ,45 to 90 minutes PT Frequency: 5 out of 7 days PT Duration Estimated Length of Stay: 7-10 days OT Intensity: Minimum of 1-2 x/day, 45 to 90 minutes OT Frequency: 5 out of 7 days OT Duration/Estimated Length of Stay: 7-9 days     Due to the current state of emergency, patients may not be receiving their 3-hours of Medicare-mandated therapy.   Team Interventions: Nursing Interventions Patient/Family Education, Bowel Management, Pain Management, Skin Care/Wound Management, Disease Management/Prevention, Medication Management, Discharge Planning  PT interventions Ambulation/gait training, Discharge planning, DME/adaptive equipment instruction, Functional mobility training, Pain management,  Psychosocial support, Splinting/orthotics, UE/LE Strength taining/ROM, Therapeutic Activities, UE/LE Coordination activities, Wheelchair propulsion/positioning, Therapeutic Exercise, Stair training, Skin care/wound management, Patient/family education, Neuromuscular re-education, Functional electrical stimulation, Disease management/prevention, Academic librarian, Training and development officer  OT Interventions Training and development officer, Discharge planning, Pain management, Self Care/advanced ADL retraining, Therapeutic Activities, Functional mobility training, Patient/family education, Therapeutic Exercise, DME/adaptive equipment instruction, UE/LE Strength taining/ROM  SLP Interventions    TR Interventions    SW/CM Interventions Discharge Planning, Psychosocial Support, Patient/Family Education   Barriers to Discharge MD  Medical stability  Nursing      PT Home environment access/layout 5 steps + 5 steps with 1 rail to access bed/bath on 2nd level  OT      SLP      SW       Team Discharge Planning: Destination: PT-Home ,OT- Home , SLP-  Projected Follow-up: PT-Home health PT, OT-  Home health OT, SLP-  Projected Equipment Needs: PT-Rolling walker with 5" wheels, OT- Tub/shower bench, SLP-  Equipment Details: PT- , OT-will need to remove shower doors to accomodate tub bench Patient/family involved in discharge planning: PT- Patient,  OT-Patient, SLP-   MD ELOS: 7-10 days Medical Rehab Prognosis:  Excellent Assessment: The patient has been admitted for CIR therapies with the diagnosis of cauda equina syndrome. The team will be addressing functional mobility, strength, stamina, balance, safety, adaptive techniques and equipment, self-care, bowel and bladder mgt, patient and caregiver education, NMR, orthotics, pain mgt. Goals have been set at supervision for mobility and mod I for basic self care tasks.  Due to the current state of emergency, patients may not be receiving their 3  hours per day of Medicare-mandated therapy.    Meredith Staggers, MD, FAAPMR      See Team Conference Notes for weekly updates to the plan of care

## 2018-06-20 NOTE — Progress Notes (Signed)
[place] PHYSICAL MEDICINE & REHABILITATION PROGRESS NOTE   Subjective/Complaints: No new complaints. Denies pain. Feels that he's progressing.   ROS: Patient denies fever, rash, sore throat, blurred vision, nausea, vomiting, diarrhea, cough, shortness of breath or chest pain, , headache, or mood change.   Objective:   Vas [place] Lower Extremity Venous (dvt)  Result Date: 06/18/2018  Lower Venous Study Indications: Swelling.  Performing Technologist: Oliver Hum RVT  Examination Guidelines: A complete evaluation includes B-mode imaging, spectral Doppler, color Doppler, and power Doppler as needed of all accessible portions of each vessel. Bilateral testing is considered an integral part of a complete examination. Limited examinations for reoccurring indications may be performed as noted.  +---------+---------------+---------+-----------+----------+-------+ RIGHT    CompressibilityPhasicitySpontaneityPropertiesSummary +---------+---------------+---------+-----------+----------+-------+ CFV      Full           Yes      Yes                          +---------+---------------+---------+-----------+----------+-------+ SFJ      Full                                                 +---------+---------------+---------+-----------+----------+-------+ FV Prox  Full                                                 +---------+---------------+---------+-----------+----------+-------+ FV Mid   Full                                                 +---------+---------------+---------+-----------+----------+-------+ FV DistalFull                                                 +---------+---------------+---------+-----------+----------+-------+ PFV      Full                                                 +---------+---------------+---------+-----------+----------+-------+ POP      Full           Yes      Yes                           +---------+---------------+---------+-----------+----------+-------+ PTV      Full                                                 +---------+---------------+---------+-----------+----------+-------+ PERO     Full                                                 +---------+---------------+---------+-----------+----------+-------+   +---------+---------------+---------+-----------+----------+-------+  LEFT     CompressibilityPhasicitySpontaneityPropertiesSummary +---------+---------------+---------+-----------+----------+-------+ CFV      Full           Yes      Yes                          +---------+---------------+---------+-----------+----------+-------+ SFJ      Full                                                 +---------+---------------+---------+-----------+----------+-------+ FV Prox  Full                                                 +---------+---------------+---------+-----------+----------+-------+ FV Mid   Full                                                 +---------+---------------+---------+-----------+----------+-------+ FV DistalFull                                                 +---------+---------------+---------+-----------+----------+-------+ PFV      Full                                                 +---------+---------------+---------+-----------+----------+-------+ POP      Full           Yes      Yes                          +---------+---------------+---------+-----------+----------+-------+ PTV      Full                                                 +---------+---------------+---------+-----------+----------+-------+ PERO     Full                                                 +---------+---------------+---------+-----------+----------+-------+     Summary: Right: There is no evidence of deep vein thrombosis in the lower extremity. No cystic structure found in the popliteal fossa. Left: There is  no evidence of deep vein thrombosis in the lower extremity. No cystic structure found in the popliteal fossa.  *See table(s) above for measurements and observations. Electronically signed by Harold Barban MD on 06/18/2018 at 2:50:18 PM.    Final    Recent Labs    06/18/18 0514  WBC 5.8  HGB 9.6*  HCT 29.5*  PLT 192   Recent Labs    06/18/18 0514  NA 133*  K 4.0  CL 95*  CO2 26  GLUCOSE 116*  BUN 15  CREATININE 0.87  CALCIUM 8.5*    Intake/Output Summary (Last 24 hours) at 06/20/2018 1011 Last data filed at 06/20/2018 0700 Gross per 24 hour  Intake 360 ml  Output 825 ml  Net -465 ml     Physical Exam: Vital Signs Blood pressure 124/69, pulse (!) 101, temperature 98.6 F (37 C), resp. rate 19, height 5\' 11"  (1.803 m), weight 86.1 kg, SpO2 99 %. Constitutional: No distress . Vital signs reviewed. HEENT: EOMI, oral membranes moist Neck: supple Cardiovascular: RRR without murmur. No JVD    Respiratory: CTA Bilaterally without wheezes or rales. Normal effort    GI: BS +, non-tender, non-distended  Musculoskeletal:  General: No edema.  Comments: LB TTP, in LSO. Neurological: He is alertand oriented to person, place, and time. Patient is alert in no acute distress. Follows full commands. UE 5/5. RLE: HF 2 to 2+, KE 1+  , ADF 3, APF 4/5, KF 4/5, HE 4/5. LLE:   4/5.---some improvement in motor exam.  Decreased sensation over primarily over right L2, L3 dermatomes remains present. DTR's absent in LE's.  Skin: Skin iswarm. He isnot diaphoretic. Back incision CDI Psychiatric: pleasant   Assessment/Plan: 1. 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  Physiatrist is providing close team supervision and 24 hour management of active medical problems listed below.  Physiatrist and rehab team continue to assess barriers to  discharge/monitor patient progress toward functional and medical goals  Care Tool:  Bathing  Bathing activity did not occur: Refused Body parts bathed by patient: Right arm, Left arm, Chest, Abdomen, Front perineal area, Face, Right upper leg, Left upper leg, Buttocks   Body parts bathed by helper: Right lower leg, Left lower leg     Bathing assist Assist Level: Minimal Assistance - Patient > 75%     Upper Body Dressing/Undressing Upper body dressing   What is the patient wearing?: Pull over shirt, Orthosis    Upper body assist Assist Level: Minimal Assistance - Patient > 75%    Lower Body Dressing/Undressing Lower body dressing      What is the patient wearing?: Pants     Lower body assist Assist for lower body dressing: Moderate Assistance - Patient 50 - 74%     Toileting Toileting Toileting Activity did not occur Landscape architect and hygiene only): Safety/medical concerns  Toileting assist Assist for toileting: Minimal Assistance - Patient > 75%     Transfers Chair/bed transfer  Transfers assist  Chair/bed transfer activity did not occur: Safety/medical concerns  Chair/bed transfer assist level: Minimal Assistance - Patient > 75%     Locomotion Ambulation   Ambulation assist   Ambulation activity did not occur: Safety/medical concerns  Assist level: Minimal Assistance - Patient > 75% Assistive device: Walker-rolling Max distance: 10'   Walk 10 feet activity   Assist  Walk 10 feet activity did not occur: Safety/medical concerns  Assist level: Minimal Assistance - Patient > 75% Assistive device: Walker-rolling   Walk 50 feet activity   Assist Walk 50 feet with 2 turns activity did not occur: Safety/medical concerns         Walk 150 feet activity   Assist Walk 150 feet activity did not occur: Safety/medical concerns         Walk 10 feet on uneven surface  activity   Assist Walk 10 feet on uneven  surfaces activity did not  occur: Safety/medical concerns         Wheelchair     Assist Will patient use wheelchair at discharge?: Yes Type of Wheelchair: Manual    Wheelchair assist level: Supervision/Verbal cueing Max wheelchair distance: 100 ft     Wheelchair 50 feet with 2 turns activity    Assist        Assist Level: Supervision/Verbal cueing   Wheelchair 150 feet activity     Assist     Assist Level: Supervision/Verbal cueing    Medical Problem List and Plan: 1.Decreased functional mobilitysecondary to severemultifactorialspinal stenosis 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 Status post L1-2 bilateral discectomy with left hemilaminotomy 06/15/2018. Lumbar corset when out of bed -Continue CIR therapies including PT, OT   -RLE improving  -will need orthotic AFO vs Knee brace, vs both 2. Antithrombotics: -DVT/anticoagulation:Subcutaneous Lovenox. dopplers negative -antiplatelet therapy: N/A 3. Pain Management:Neurontin 300 mg 3 times daily, Robaxin and oxycodone as needed 4. Mood:Provide emotional support -antipsychotic agents: N/A 5. Neuropsych: This patientiscapable of making decisions on hisown behalf. 6. Skin/Wound Care:Routine skin checks 7. Fluids/Electrolytes/Nutrition:encourage PO    8. Acute blood loss anemia. hgb sl down to 9.6  -no overt signs of blood loss  -follow up next week 9.Hypertension. Patient on Norvasc 5 mg daily prior to admission. Resume as needed 10. BPH. Resumed Flomax 0.4 mg daily. -pt states he's been voiding without significant difficulty 11. Hyperlipidemia. Resume Zocor 12. 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

## 2018-06-20 NOTE — Progress Notes (Signed)
Social Work Patient ID: Joseph Moyer, male   DOB: 11/18/1940, 78 y.o.   MRN: [id number]   Met with pt following team conference on Tuesday.  He is aware of targeted date of 5/27, however, hopeful he will be ready to d/c sooner.  I encouraged him to speak with his therapists and MD about this.  Will follow for d/c needs.  Bart Ashford, LCSW

## 2018-06-20 NOTE — Progress Notes (Signed)
    Subjective:     Patient reports pain as 2 on 0-10 scale.  Reports decreased leg pain reports incisional back pain  - improving Positive void Positive bowel movement (06/18/18) Positive flatus Negative chest pain or shortness of breath  Objective: Vital signs in last 24 hours: Temp:  [98.3 F (36.8 C)-98.6 F (37 C)] 98.6 F (37 C) (05/21 0358) Pulse Rate:  [99-105] 101 (05/21 0358) Resp:  [17-19] 19 (05/21 0358) BP: (124-149)/(67-74) 124/69 (05/21 0358) SpO2:  [97 %-99 %] 99 % (05/21 0358)  Intake/Output from previous day: 05/20 0701 - 05/21 0700 In: 600 [P.O.:600] Out: 1175 [Urine:1175]  Labs: Recent Labs    06/18/18 0514  WBC 5.8  RBC 3.38*  HCT 29.5*  PLT 192   Recent Labs    06/18/18 0514  NA 133*  K 4.0  CL 95*  CO2 26  BUN 15  CREATININE 0.87  GLUCOSE 116*  CALCIUM 8.5*   No results for input(s): LABPT, INR in the last 72 hours.  Physical Exam: ABD soft Intact pulses distally Incision: dressing C/D/I and no drainage Compartment soft R LE: 3/5 motor.  L LE:  4+/5 motor Body mass index is 26.47 kg/m.   Ambulating with improved LE strength   Assessment/Plan: Patient stable  xrays n/a Continue mobilization with physical therapy Continue care  Up with therapy  Recommend d/c oxycodone as it will contribute to constipation.  [place] for [employer] and tylenol for pain Continue care - will f/u with me after d/c next week\ Overall patient making excellent progress  Melina Schools, MD [facility] [phone]

## 2018-06-20 NOTE — Patient Care Conference (Signed)
Inpatient RehabilitationTeam Conference and Plan of Care Update Date: 06/18/2018   Time: 2:50 PM    Patient Name: Joseph Moyer      Medical Record Number: [id number]  Date of Birth: 01/02/1941 Sex: Male         Room/Bed: 4W17C/4W17C-01 Payor Info: Payor: [employer] MEDICARE / Plan: [employer] MEDICARE HMO/PPO / Product Type: *No Product type* /    Admitting Diagnosis: TBI Team  Other Neuro, Lumbar spinal stenosis neurogenic claudication  13-14 days  Admit Date/Time:  06/17/2018  4:14 PM Admission Comments: No comment available   Primary Diagnosis:  <principal problem not specified> Principal Problem: <principal problem not specified>  Patient Active Problem List   Diagnosis Date Noted  . 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 S/P lumbar fusion 06/15/2018  . 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 Overweight (BMI 25.0-29.9) 07/31/2014  . Acute diverticulitis 07/30/2014  . Elevated BP 07/30/2014  . Hyponatremia 07/30/2014  . Hyperlipidemia 07/30/2014  . HTN (hypertension) 07/30/2014    Expected Discharge Date: Expected Discharge Date: 06/26/18  Team Members Present: Physician leading conference: Dr. 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,   improve functional use RLE  pain mgt, bowel regimen   Bowel/Bladder   Continent of B/B, LBM 5/15  Remain continent of B/B  Offer toileting PRN   Swallow/Nutrition/ Hydration             ADL's   Eval pending         Mobility   min assist overall with RW  supervision overall with LRAD  transfers, bed mobility, gait, stairs, strengthening, NMR, balance, d/c planning, pt education   Communication             Safety/Cognition/ Behavioral  Observations  Educated pt to call for assistance with transfers  Free from injury  No falls or injuries with transfers   Pain   Surgical site pain to mid back. C/O pain level of 7/10 on pain scale of 0 - 10  Pain level of 3 or less on pain scale of 0 to 10  Assess pain every 4 hrs and PRN   Skin   Surgical site with honey comb drsg to mid back  Assess for infection  Pt will be free from infection and skin breakdown    Rehab Goals Patient on target to meet rehab goals: Yes *See Care Plan and progress notes for long and short-term goals.     Barriers to Discharge  Current Status/Progress Possible Resolutions Date Resolved   Physician    Neurogenic Bowel & Bladder;Medical stability        ?orthotics, bowel regimen      Nursing                  PT  Home environment access/layout  5 steps + 5 steps with 1 rail to access bed/bath on 2nd level              OT                  SLP                SW  Discharge Planning/Teaching Needs:  Pt to d/c home with wife who can provide 24/7 assistance.  Teaching needs TBD   Team Discussion:  New eval;  Slight neurogenic b/b. Improving function.  Min overall with PT on eval.  OT eval pending.  Revisions to Treatment Plan:  NA    Continued Need for Acute Rehabilitation Level of Care: The patient requires daily medical management by a physician with specialized training in physical medicine and rehabilitation for the following conditions: Daily direction of a multidisciplinary physical rehabilitation program to ensure safe treatment while eliciting the highest outcome that is of practical value to the patient.: Yes Daily medical management of patient stability for increased activity during participation in an intensive rehabilitation regime.: Yes Daily analysis of laboratory values and/or radiology reports with any subsequent need for medication adjustment of medical intervention for : Post surgical problems;Wound care  problems;Neurological problems   I attest that I was present, lead the team conference, and concur with the assessment and plan of the team.   Lennart Pall 06/20/2018, 12:24 PM    Team conference was held via web/ teleconference due to [place] - 19

## 2018-06-20 NOTE — Progress Notes (Signed)
Occupational Therapy Session Note  Patient Details  Name: Joseph Moyer MRN: [id number] Date of Birth: 1940-03-05  Today's Date: 06/20/2018 OT Individual Time: 1130-1225 OT Individual Time Calculation (min): 55 min    Short Term Goals: Week 1:  OT Short Term Goal 1 (Week 1): na STG = LTG  Skilled Therapeutic Interventions/Progress Updates:    Patient seated in w/c and ready for therapy session.  He is alert, pleasant/cooperative and aware of needs.  W/c propulsion CS/set up level to and from therapy gym.  Completed dynamic standing (stepping and weight shift) activities with RW CGA 4 x 3 minutes each.  Completed UE ergometer for conditioning 2 x 5 minutes. UB [place] activity.  Ambulation with RW 75 feet CGA.  Grooming w/c level with set up.  He noted fatigue at end of session.  He remained in the w/c with seat belt alarm set , call bell and lunch tray set up  Therapy Documentation Precautions:  Precautions Precautions: Fall, Back Required Braces or Orthoses: Spinal Brace Spinal Brace: Lumbar corset, Applied in sitting position Restrictions Weight Bearing Restrictions: No General:   Vital Signs: Therapy Vitals Temp: 98.6 F (37 C) Temp Source: Oral Pulse Rate: 93 Resp: 20 BP: (!) 153/70 Patient Position (if appropriate): Lying Oxygen Therapy SpO2: 98 % O2 Device: Room Air Pain: Pain Assessment Pain Scale: 0-10 Pain Score: 0-No pain   Other Treatments:     Therapy/Group: Individual Therapy  Carlos Levering 06/20/2018, 2:21 PM

## 2018-06-20 NOTE — Progress Notes (Signed)
Occupational Therapy Session Note  Patient Details  Name: Joseph Moyer MRN: [id number] Date of Birth: [birth year]  Skilled Therapeutic Interventions/Progress Updates:    Offered additional therapy session this afternoon due to missed time earlier in the week.  Patient declined due to fatigue.    Therapy Documentation Precautions:  Precautions Precautions: Fall, Back Required Braces or Orthoses: Spinal Brace Spinal Brace: Lumbar corset, Applied in sitting position Restrictions Weight Bearing Restrictions: No Other Treatments:     Therapy/Group: Individual Therapy  Carlos Levering 06/20/2018, 2:28 PM

## 2018-06-21 ENCOUNTER — Inpatient Hospital Stay (HOSPITAL_COMMUNITY): Payer: Medicare HMO

## 2018-06-21 MED ORDER — SORBITOL 70 % SOLN
60.0000 mL | Status: AC
  Administered 2018-06-21: 60 mL via ORAL
  Filled 2018-06-21: qty 60

## 2018-06-21 NOTE — Plan of Care (Signed)
  Problem: Consults Goal: RH SPINAL CORD INJURY PATIENT EDUCATION Description  See Patient Education module for education specifics.  Outcome: Progressing Goal: Skin Care Protocol Initiated - if Braden Score 18 or less Description If consults are not indicated, leave blank or document N/A Outcome: Progressing   Problem: SCI BOWEL ELIMINATION Goal: RH STG MANAGE BOWEL WITH ASSISTANCE Description STG Manage Bowel with mod I Assistance.  Outcome: Progressing Goal: RH STG SCI MANAGE BOWEL WITH MEDICATION WITH ASSISTANCE Description STG SCI Manage bowel with medication with mod I assistance.  Outcome: Progressing   Problem: RH SKIN INTEGRITY Goal: RH STG ABLE TO PERFORM INCISION/WOUND CARE W/ASSISTANCE Description STG Able To Perform Incision/Wound Care With min Assistance.  Outcome: Progressing   Problem: RH SAFETY Goal: RH STG ADHERE TO SAFETY PRECAUTIONS W/ASSISTANCE/DEVICE Description STG Adhere to Safety Precautions With cues/supervision Assistance/Device.  Outcome: Progressing   Problem: RH PAIN MANAGEMENT Goal: RH STG PAIN MANAGED AT OR BELOW PT'S PAIN GOAL Description At or below level 5  Outcome: Progressing   Problem: RH KNOWLEDGE DEFICIT SCI Goal: RH STG INCREASE KNOWLEDGE OF SELF CARE AFTER SCI Description Pt will be able to direct care at discharge independently using handouts/educational resources independently  Outcome: Progressing   Problem: Consults Goal: RH SPINAL CORD INJURY PATIENT EDUCATION Description  See Patient Education module for education specifics.  Outcome: Progressing Goal: Skin Care Protocol Initiated - if Braden Score 18 or less Description If consults are not indicated, leave blank or document N/A Outcome: Progressing   Problem: SCI BOWEL ELIMINATION Goal: RH STG MANAGE BOWEL WITH ASSISTANCE Description STG Manage Bowel with mod I Assistance.  Outcome: Progressing Goal: RH STG SCI MANAGE BOWEL WITH MEDICATION WITH  ASSISTANCE Description STG SCI Manage bowel with medication with mod I assistance.  Outcome: Progressing   Problem: RH SKIN INTEGRITY Goal: RH STG ABLE TO PERFORM INCISION/WOUND CARE W/ASSISTANCE Description STG Able To Perform Incision/Wound Care With min Assistance.  Outcome: Progressing   Problem: RH SAFETY Goal: RH STG ADHERE TO SAFETY PRECAUTIONS W/ASSISTANCE/DEVICE Description STG Adhere to Safety Precautions With cues/supervision Assistance/Device.  Outcome: Progressing   Problem: RH PAIN MANAGEMENT Goal: RH STG PAIN MANAGED AT OR BELOW PT'S PAIN GOAL Description At or below level 5  Outcome: Progressing   Problem: RH KNOWLEDGE DEFICIT SCI Goal: RH STG INCREASE KNOWLEDGE OF SELF CARE AFTER SCI Description Pt will be able to direct care at discharge independently using handouts/educational resources independently  Outcome: Progressing

## 2018-06-21 NOTE — Progress Notes (Signed)
Physical Therapy Session Note  Patient Details  Name: Joseph Moyer MRN: [id number] Date of Birth: Jun 24, 1940  Today's Date: 06/21/2018 PT Individual Time: 0935-1030 PT Individual Time Calculation (min): 55 min   Short Term Goals: Week 1:  PT Short Term Goal 1 (Week 1): STG = LTG due to short ELOS.  Skilled Therapeutic Interventions/Progress Updates:    w/c propulsion for general strengthening and endurance x 150' with BUE. Supervision cues for w/c parts management to set up for transfer. Functional transfers throughout session with RW with overall CGA focusing technique and decreased reliance on RW for sit <> stand. Blocked practice NMR for sit <> stands without UE support progressing from min assist to CGA x 5 reps and manual cues for anterior weightshift. Steadying assist needed for balance without UE support. Stair negotiation for home accessibility training (though patient reports he can stay on main level upon d/c and enter from back of house where only 1 threshold step needs to be navigated). Practiced up/down 4 steps with bilateral rails and then again with R rail only with CGA and cues for foot placement. Pt will benefit from practicing with L rail only as one set of his steps at home has R rail only and the other L rail only. Dynamic gait through obstacle course to simulate home environment mobility with CGA and safe use noted of RW. Simulated car transfer to sedan height with squat/stand pivot technique w/c <> car and then short distance gait approach with RW. Pt performed better with short distance gait/stand step with RW then with other technique (required heavier min assist and decreased adherence to back precautions). With RW, able to perform with CGA getting into the car, managed RLE independently using UE's to assist, but min assist for transfer out of car due to lower surface. Would benefit from practicing again prior to d/c. Discussed f/u recommendations and equipment needs and  notified CSW.  Therapy Documentation Precautions:  Precautions Precautions: Fall, Back Required Braces or Orthoses: Spinal Brace Spinal Brace: Lumbar corset, Applied in sitting position Restrictions Weight Bearing Restrictions: No   Pain:  premedicated for back pain. Pt did not rate. Rest breaks as needed.    Therapy/Group: Individual Therapy  Canary Brim Ivory Broad, PT, DPT, CBIS  06/21/2018, 10:58 AM

## 2018-06-21 NOTE — Progress Notes (Signed)
[place] PHYSICAL MEDICINE & REHABILITATION PROGRESS NOTE   Subjective/Complaints: Feels that he's making progress in mobility. Pain seems better  ROS: Patient denies fever, rash, sore throat, blurred vision, nausea, vomiting, diarrhea, cough, shortness of breath or chest pain, joint or back pain, headache, or mood change.     Objective:   No results found. No results for input(s): WBC, HGB, HCT, PLT in the last 72 hours. No results for input(s): NA, K, CL, CO2, GLUCOSE, BUN, CREATININE, CALCIUM in the last 72 hours.  Intake/Output Summary (Last 24 hours) at 06/21/2018 1116 Last data filed at 06/21/2018 0745 Gross per 24 hour  Intake 240 ml  Output 650 ml  Net -410 ml     Physical Exam: Vital Signs Blood pressure 127/65, pulse 92, temperature 98.4 F (36.9 C), resp. rate 16, height 5\' 11"  (1.803 m), weight 86.1 kg, SpO2 100 %. Constitutional: No distress . Vital signs reviewed. HEENT: EOMI, oral membranes moist Neck: supple Cardiovascular: RRR without murmur. No JVD    Respiratory: CTA Bilaterally without wheezes or rales. Normal effort    GI: BS +, non-tender, non-distended  Musculoskeletal:  General: No edema.  Comments: LB TTP, in LSO. Neurological: He is alertand oriented to person, place, and time. Patient is alert in no acute distress. Follows full commands. UE 5/5. RLE: HF 2+, KE 1+ to 2- at best , ADF 3- to 3/5, APF 4/5, KF 4/5, HE 4/5. LLE:   4/5.---some improvement in motor exam.  Decreased sensation over primarily over right L2, L3 dermatomes remains present. DTR's absent in LE's.  Skin: Skin iswarm. He isnot diaphoretic. Back incision remains CDIwith honeycomb dressing Psychiatric: pleasant   Assessment/Plan: 1. 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  Physiatrist is providing close team supervision and  24 hour management of active medical problems listed below.  Physiatrist and rehab team continue to assess barriers to discharge/monitor patient progress toward functional and medical goals  Care Tool:  Bathing  Bathing activity did not occur: Refused Body parts bathed by patient: Right arm, Left arm, Chest, Abdomen, Front perineal area, Face, Right upper leg, Left upper leg, Buttocks   Body parts bathed by helper: Right lower leg, Left lower leg     Bathing assist Assist Level: Minimal Assistance - Patient > 75%     Upper Body Dressing/Undressing Upper body dressing   What is the patient wearing?: Pull over shirt, Orthosis    Upper body assist Assist Level: Supervision/Verbal cueing    Lower Body Dressing/Undressing Lower body dressing      What is the patient wearing?: Pants     Lower body assist Assist for lower body dressing: Supervision/Verbal cueing     Toileting Toileting Toileting Activity did not occur (Clothing management and hygiene only): Safety/medical concerns  Toileting assist Assist for toileting: Minimal Assistance - Patient > 75%     Transfers Chair/bed transfer  Transfers assist  Chair/bed transfer activity did not occur: Safety/medical concerns  Chair/bed transfer assist level: Contact Guard/Touching assist     Locomotion Ambulation   Ambulation assist   Ambulation activity did not occur: Safety/medical concerns  Assist level: Contact Guard/Touching assist Assistive device: Walker-rolling Max distance: 65'   Walk 10 feet activity   Assist  Walk 10 feet activity did not occur: Safety/medical concerns  Assist level: Contact Guard/Touching assist Assistive device: Walker-rolling   Walk 50 feet activity   Assist  Walk 50 feet with 2 turns activity did not occur: Safety/medical concerns  Assist level: Contact Guard/Touching assist Assistive device: Walker-rolling    Walk 150 feet activity   Assist Walk 150 feet activity did  not occur: Safety/medical concerns         Walk 10 feet on uneven surface  activity   Assist Walk 10 feet on uneven surfaces activity did not occur: Safety/medical concerns         Wheelchair     Assist Will patient use wheelchair at discharge?: Yes Type of Wheelchair: Manual    Wheelchair assist level: Supervision/Verbal cueing Max wheelchair distance: 150'    Wheelchair 50 feet with 2 turns activity    Assist        Assist Level: Supervision/Verbal cueing   Wheelchair 150 feet activity     Assist     Assist Level: Supervision/Verbal cueing    Medical Problem List and Plan: 1.Decreased functional mobilitysecondary to severemultifactorialspinal stenosis 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 Status post L1-2 bilateral discectomy with left hemilaminotomy 06/15/2018. Lumbar corset when out of bed -Continue CIR therapies including PT, OT   -RLE improving  -has been ambulating with RW and without an orthotic. Uses a lot of upper body. Still suspect he will need RLE orthotic at some point 2. Antithrombotics: -DVT/anticoagulation:Subcutaneous Lovenox. dopplers negative -antiplatelet therapy: N/A 3. Pain Management:Neurontin 300 mg 3 times daily, Robaxin and oxycodone as needed 4. Mood:Provide emotional support -antipsychotic agents: N/A 5. Neuropsych: This patientiscapable of making decisions on hisown behalf. 6. Skin/Wound Care:Routine skin checks 7. Fluids/Electrolytes/Nutrition:encourage PO    8. Acute blood loss anemia. hgb sl down to 9.6  -no overt signs of blood loss  -follow up Monday 9.Hypertension. Patient on Norvasc 5 mg daily prior to admission. Resume as needed 10. BPH. Resumed Flomax 0.4 mg daily. -pt states he's been voiding without significant difficulty 11. Hyperlipidemia. Resume Zocor 12. Constipation vs Neurogenic  bowel: continue to augment bowel regimen    -+BM 5/19   -continue daily miralax   -continue senokot at hs   -sorbitol 60 cc today, ss enema if needed  LOS: 4 days A FACE TO [place] 06/21/2018, 11:16 AM

## 2018-06-21 NOTE — Progress Notes (Signed)
Occupational Therapy Session Note  Patient Details  Name: Joseph Moyer MRN: [id number] Date of Birth: 1941/01/06  Today's Date: 06/21/2018 OT Individual Time: 4010-2725 OT Individual Time Calculation (min): 45 min    Short Term Goals: Week 1:  OT Short Term Goal 1 (Week 1): na STG = LTG  Skilled Therapeutic Interventions/Progress Updates:    1:1. Pt reporting 5/10 back pain OT retrieves Ice at end of session. Pt completes sit to stand from elevated bed with supervision with RW and S to ambualte into bathroom. Pt transfers onto TTB with VC for RW managmetn and S. Pt able to doff clothing with reacher for LB dressing except for R sock/teds. Pt bathes seated leaning laterally to wash buttocks with supervision, but min A provided d/t no LHSS and OT washes B feet. Pt completes dressing at sink with S for sit to stand from w/c for LB using reacher and sock aide. Pt completes grooming in standing at sink with supervision.   Therapy Documentation Precautions:  Precautions Precautions: Fall, Back Required Braces or Orthoses: Spinal Brace Spinal Brace: Lumbar corset, Applied in sitting position Restrictions Weight Bearing Restrictions: No General:   Vital Signs: Therapy Vitals Temp: 98.4 F (36.9 C) Pulse Rate: 92 Resp: 16 BP: 127/65 Patient Position (if appropriate): Lying Oxygen Therapy SpO2: 100 % O2 Device: Room Air Pain:   ADL: ADL Eating: Independent Where Assessed-Eating: Wheelchair Grooming: Setup Where Assessed-Grooming: Sitting at sink, Wheelchair Upper Body Bathing: Setup Where Assessed-Upper Body Bathing: Sitting at sink, Wheelchair Lower Body Bathing: Moderate assistance Where Assessed-Lower Body Bathing: Sitting at sink, Wheelchair, Standing at sink Upper Body Dressing: Minimal assistance Where Assessed-Upper Body Dressing: Wheelchair Lower Body Dressing: Moderate assistance Where Assessed-Lower Body Dressing: Wheelchair Toileting: Maximal  assistance Where Assessed-Toileting: Glass blower/designer: Moderate assistance Toilet Transfer Method: Stand pivot Toilet Transfer Equipment: Raised toilet seat, Grab bars Tub/Shower Transfer: Not assessed ADL Comments: patient declined shower, completed sponge bath seated at sink Vision   Perception    Praxis   Exercises:   Other Treatments:     Therapy/Group: Individual Therapy  Tonny Branch 06/21/2018, 8:14 AM

## 2018-06-21 NOTE — Progress Notes (Addendum)
Physical Therapy Session Note  Patient Details  Name: Joseph Moyer MRN: [id number] Date of Birth: 1940/03/21  Today's Date: 06/21/2018 PT Individual Time: 1346-1415 PT Individual Time Calculation (min): 29 min   Short Term Goals: Week 1:  PT Short Term Goal 1 (Week 1): STG = LTG due to short ELOS.  Skilled Therapeutic Interventions/Progress Updates:     Patient in bed upon PT arrival. Patient alert and agreeable to PT session. Patient reported that he had taken a lot of medication to help him have a bowl movement today, but had been unsuccessful so far, but that he wanted to stay close to the bathroom just in case he needed to go. PT limited session to patient's room to accommodate potential urgency patient  to go to the bathroom.   Therapeutic Activity: Bed Mobility: Patient performed supine to sit with supervision x2 and sit to supin with min A for R LE. Provided verbal cues for log rolling to maintain spinal precautions. Transfers: Patient performed sit to/from stand x6 with CGA using a RW. Provided verbal cues for reaching back before sitting and keeping his back straight to maintain spinal precautions. Patient performed toileting during session, but was unsuccessful with having a BM. He required total a for peri-care and CGA for LB dressing.  Gait Training:  Patient ambulated 18 feet x2 using RW with CGA for safety and balance. Ambulated with decreased gait speed, decreased step length R>L, increased hip and knee flexion, forward trunk lean, and downward head gaze. Provided verbal cues for erect posture, looking ahead, and increased step length.  Therapeutic Exercise: Patient performed the following exercises with verbal and tactile cues for proper technique. -B Step taps to 4" step 2x15 with B UE support, only able to bring R foot to top of step x5 -seated isometric hip adduction x10 with 5 sec hold (instructed to perform 3x per day) -B quad sets x10 with 5 sec hold (instructed  to perform 3x per day) -B glut sets x10 with 5 sec hold (instructed to perform 3x per day) -R heel slides with AAROM x10  -R hip abduction with AAROM x10 -R clam shells x10  Patient in bed at end of session with breaks locked, bed alarm set, and all needs within reach. Patient was agreeable to sitting up for dinner today for increased sitting tolerance.   Therapy Documentation Precautions:  Precautions Precautions: Fall, Back Required Braces or Orthoses: Spinal Brace Spinal Brace: Lumbar corset, Applied in sitting position Restrictions Weight Bearing Restrictions: No Pain: Patient reported 5/10 back pain and stated that he had received pain medicine prior to session. PT provided seated rest breaks, repositioning, and distraction for pain interventions throughout session.   Therapy/Group: Individual Therapy  Gregorio Worley L Dorota Heinrichs PT, DPT  06/21/2018, 4:31 PM

## 2018-06-22 DIAGNOSIS — L299 Pruritus, unspecified: Secondary | ICD-10-CM

## 2018-06-22 DIAGNOSIS — G959 Disease of spinal cord, unspecified: Secondary | ICD-10-CM

## 2018-06-22 MED ORDER — DIPHENHYDRAMINE HCL 25 MG PO CAPS
25.0000 mg | ORAL_CAPSULE | Freq: Four times a day (QID) | ORAL | Status: DC | PRN
  Administered 2018-06-22 – 2018-06-24 (×6): 25 mg via ORAL
  Filled 2018-06-22 (×6): qty 1

## 2018-06-22 MED ORDER — CAMPHOR-MENTHOL 0.5-0.5 % EX LOTN
TOPICAL_LOTION | CUTANEOUS | Status: DC | PRN
  Filled 2018-06-22 (×2): qty 222

## 2018-06-22 NOTE — Progress Notes (Signed)
[facility] PHYSICAL MEDICINE & REHABILITATION PROGRESS NOTE   Subjective/Complaints: Itching all over, we discussed that this is most likely his pain medication.  He is reluctant to back off his pain medication  ROS: Patient denies fever, rash, sore throat, blurred vision, nausea, vomiting, diarrhea, cough, shortness of breath or chest pain, joint or back pain, headache, or mood change.     Objective:   No results found. No results for input(s): WBC, HGB, HCT, PLT in the last 72 hours. No results for input(s): NA, K, CL, CO2, GLUCOSE, BUN, CREATININE, CALCIUM in the last 72 hours.  Intake/Output Summary (Last 24 hours) at 06/22/2018 1102 Last data filed at 06/22/2018 1005 Gross per 24 hour  Intake 720 ml  Output -  Net 720 ml     Physical Exam: Vital Signs Blood pressure 117/70, pulse 92, temperature 98.5 F (36.9 C), temperature source Oral, resp. rate 18, height 5\' 11"  (1.803 m), weight 86.1 kg, SpO2 97 %. Constitutional: No distress . Vital signs reviewed. HEENT: EOMI, oral membranes moist Neck: supple Cardiovascular: RRR without murmur. No JVD    Respiratory: CTA Bilaterally without wheezes or rales. Normal effort    GI: BS +, non-tender, non-distended  Musculoskeletal:  General: No edema.  Comments: LB TTP, in LSO. Neurological: He is alertand oriented to person, place, and time. Patient is alert in no acute distress. Follows full commands. UE 5/5. RLE: HF 2+, KE 1+ to 2- at best , ADF 3- to 3/5, APF 4/5, KF 4/5, HE 4/5. LLE:   4/5.---some improvement in motor exam.  Decreased sensation over primarily over right L2, L3 dermatomes remains present. DTR's absent in LE's.  Skin: Skin iswarm. He isnot diaphoretic. There is no rash, there are scratch marks, skin is a little dry Back incision remains CDIwith honeycomb dressing Psychiatric: pleasant   Assessment/Plan: 1. Functional deficits secondary to lumbar stenosis/HNP with conus syndrome which require  3+ hours per day of interdisciplinary therapy in a comprehensive inpatient rehab setting.  Physiatrist is providing close team supervision and 24 hour management of active medical problems listed below.  Physiatrist and rehab team continue to assess barriers to discharge/monitor patient progress toward functional and medical goals  Care Tool:  Bathing  Bathing activity did not occur: Refused Body parts bathed by patient: Right arm, Left arm, Chest, Abdomen, Front perineal area, Face, Right upper leg, Left upper leg, Buttocks   Body parts bathed by helper: Right lower leg, Left lower leg     Bathing assist Assist Level: Minimal Assistance - Patient > 75%     Upper Body Dressing/Undressing Upper body dressing   What is the patient wearing?: Pull over shirt, Orthosis    Upper body assist Assist Level: Supervision/Verbal cueing    Lower Body Dressing/Undressing Lower body dressing      What is the patient wearing?: Pants     Lower body assist Assist for lower body dressing: Supervision/Verbal cueing     Toileting Toileting Toileting Activity did not occur (Clothing management and hygiene only): Safety/medical concerns  Toileting assist Assist for toileting: Minimal Assistance - Patient > 75%     Transfers Chair/bed transfer  Transfers assist  Chair/bed transfer activity did not occur: Safety/medical concerns  Chair/bed transfer assist level: Contact Guard/Touching assist     Locomotion Ambulation   Ambulation assist   Ambulation activity did not occur: Safety/medical concerns  Assist level: Contact Guard/Touching assist Assistive device: Walker-rolling Max distance: 18'   Walk 10 feet activity   Assist  Walk 10 feet activity did not occur: Safety/medical concerns  Assist level: Contact Guard/Touching assist Assistive device: Walker-rolling   Walk 50 feet activity   Assist Walk 50 feet with 2 turns activity did not occur: Safety/medical  concerns  Assist level: Contact Guard/Touching assist Assistive device: Walker-rolling    Walk 150 feet activity   Assist Walk 150 feet activity did not occur: Safety/medical concerns         Walk 10 feet on uneven surface  activity   Assist Walk 10 feet on uneven surfaces activity did not occur: Safety/medical concerns         Wheelchair     Assist Will patient use wheelchair at discharge?: Yes Type of Wheelchair: Manual    Wheelchair assist level: Supervision/Verbal cueing Max wheelchair distance: 150'    Wheelchair 50 feet with 2 turns activity    Assist        Assist Level: Supervision/Verbal cueing   Wheelchair 150 feet activity     Assist     Assist Level: Supervision/Verbal cueing    Medical Problem List and Plan: 1.Decreased functional mobilitysecondary to severemultifactorialspinal stenosis 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 Status post L1-2 bilateral discectomy with left hemilaminotomy 06/15/2018. Lumbar corset when out of bed -Continue CIR therapies including PT, OT     -has been ambulating with RW and without an orthotic. Uses a lot of upper body. Still suspect he will need RLE orthotic at some point 2. Antithrombotics: -DVT/anticoagulation:Subcutaneous Lovenox. dopplers negative -antiplatelet therapy: N/A 3. Pain Management:Neurontin 300 mg 3 times daily, Robaxin and oxycodone as needed 4. Mood:Provide emotional support -antipsychotic agents: N/A 5. Neuropsych: This patientiscapable of making decisions on hisown behalf. 6. Skin/Wound Care:Routine skin checks 7. Fluids/Electrolytes/Nutrition:encourage PO    8. Acute blood loss anemia. hgb sl down to 9.6  -no overt signs of blood loss  -follow up Monday 9.Hypertension. Patient on Norvasc 5 mg daily prior to admission. Resume as needed 10. BPH. Resumed Flomax 0.4 mg  daily. -pt states he's been voiding without significant difficulty 11. Hyperlipidemia. Resume Zocor 12. 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  Pruritus secondary to oxycodone, will add Sarna lotion as well as as needed Benadryl LOS: 5 days A FACE TO [place] 06/22/2018, 11:02 AM

## 2018-06-23 ENCOUNTER — Inpatient Hospital Stay (HOSPITAL_COMMUNITY): Payer: Medicare HMO | Admitting: Physical Therapy

## 2018-06-23 ENCOUNTER — Inpatient Hospital Stay (HOSPITAL_COMMUNITY): Payer: Medicare HMO | Admitting: Occupational Therapy

## 2018-06-23 NOTE — Progress Notes (Signed)
Physical Therapy Session Note  Patient Details  Name: Joseph Moyer MRN: [id number] Date of Birth: 14-Nov-1940  Today's Date: 06/23/2018 PT Individual Time: 0903-1000 and 4098-1191 PT Individual Time Calculation (min): 57 min and 73 min  Short Term Goals: Week 1:  PT Short Term Goal 1 (Week 1): STG = LTG due to short ELOS.  Skilled Therapeutic Interventions/Progress Updates:    Session 1: Pt received sitting on toilet and agreeable to therapy session; however, requests additional time for toileting. Pt unable to have BM - RN notified. Sit>stand BSC over toilet>RW with CGA for steadying and ambulated ~67ft using RW to w/c with CGA for steadying. Pt donned lumbar corset sitting in w/c and wore for entire session - pt able to verbalize spine precautions with minimal cuing to recall no twisting. Pt requesting to brush teeth and wash face - performed sitting in w/c at sink with set-up assistance. Reports 7/10 pain in back with RN notified and present for medication administration. Performed seated R LE LAQ x15 repetitions with assist for increased ROM and cuing with visual feedback on increased muscle activation. Transported to/from gym in w/c. Ascended/descended 8 steps (6" height) using B UE support on R HR and min assist progressed to CGA for balance with step-to pattern and cuing for R foot placement on step for increased safety. Performed ambulatory car transfer (sedan height) x2 using RW with CGA for balance and pt demonstrating ability to stand from car with CGA for steadying, which is improved from prior sessions. Performed 2 sets of R LE NMR standing with B UE support on RW while stepping R LE up/down onto 2" step focusing on hip flexion, knee flexion, and ankle DF muscle activaiton to replicate gait - min assist for foot placment on/off step - cuing for increased DF activation and increased hip flexion. Transported back to room and left sitting in w/c with OT present for therapy.  Session 2: Pt  received supine in bed and agreeable to therapy session. Supine>sit, HOB partially elevated, with supervision and pt demonstrating use of  BUEs to assist with R LE management. Pt donned lumbar corset sitting on EOB and worn for entire session. Squat pivot EOB>w/c>EOM>w/c with close supervision for safety throughout session.  Transported to/from gym in w/c for time management and energy conservation. Sit>supine with min assist for R LE management. Performed L sidelying 4 bouts to muscle fatigue of R LE NMR using powder board focusing on hip/knee flexion/extension and ankle DF//PF muscle activaiton to replicate gait mechanics. Performed supine R LE D1 reversals with resistance applied on extension 3 bouts to muscle fatigue - pt required assistance for flexion portion due to muscular weakness. Performed 3 bouts of repeated L LE step up/down 4" step with B UE support on RW progressed to only R UE support focusing on R LE stance control with glute and quadriceps muscle activation - mirror feedback provided for upright and weight shifting as well as manual facilitation with multimodal cuing on sequencing of weight shift and muscle activation. Pt requesting to weigh himself with CGA for steadying while stepping onto/off of scale (pt's weight 187.6). Ambulated ~44ft using RW with CGA for steadying and multimodal cuing for improved R LE hip flexion and ankle DF during swing phase as well as glute and quad activation during stance phase. Pt doffed lumbar corset sitting EOB. Sit>supine with min assist for R LE management and cuing for logroll technique. Pt left supine in bed with needs in reach and bed alarm on.  Therapy Documentation Precautions:  Precautions Precautions: Fall, Back Required Braces or Orthoses: Spinal Brace Spinal Brace: Lumbar corset, Applied in sitting position Restrictions Weight Bearing Restrictions: No  Pain:  Session 1: 7/10 in low back - details above.  Session 2: Reports low back pain  throughout session but unrated and not limiting participation with therapy - rest breaks and repositioning for pain management throughout.   Therapy/Group: Individual Therapy  Tawana Scale, PT, DPT 06/23/2018, 7:51 AM

## 2018-06-23 NOTE — Progress Notes (Signed)
Occupational Therapy Session Note  Patient Details  Name: Joseph Moyer MRN: [id number] Date of Birth: 1941-01-24  Today's Date: 06/23/2018 OT Individual Time: 1001-1044 OT Individual Time Calculation (min): 43 min   Short Term Goals: Week 1:  OT Short Term Goal 1 (Week 1): na STG = LTG     Skilled Therapeutic Interventions/Progress Updates:    Pt greeted in w/c via PT handoff. Premedicated for back pain. Requesting to shower. He ambulated into bathroom with RW and supervision assist. Using the reacher for back precaution adherence, he doffed LB clothing while sitting on TTB. Pt then bathed while seated with overall Min A for feet. LH sponges are still out of supply. OT also reinforced back incision cover with tegaderm prior to shower. Afterwards, he resumed dressing sit<stand from toilet using device. Min vcs for correct sock aide use. He needed assist for Teds. Once pt returned to w/c he donned LSO with setup. OT provided him with walker and reacher bag for device. Discussed using these bags for gathering items during his ADL routine and also when engaging in IADL tasks at home. He verbalized understanding. At end of session pt was left in w/c with chair alarm set. Tx focus was placed on functional ambulation, dynamic balance, and adaptive bathing/dressing skills for precaution adherence.     Therapy Documentation Precautions:  Precautions Precautions: Fall, Back Required Braces or Orthoses: Spinal Brace Spinal Brace: Lumbar corset, Applied in sitting position Restrictions Weight Bearing Restrictions: No Pain: Pain Assessment Pain Scale: 0-10 Pain Score: 5  Pain Type: Surgical pain Pain Location: Back Pain Orientation: Lower Pain Descriptors / Indicators: Aching Pain Frequency: Intermittent Pain Onset: On-going Patients Stated Pain Goal: 2 Pain Intervention(s): Refused ADL: ADL Eating: Independent Where Assessed-Eating: Wheelchair Grooming: Setup Where Assessed-Grooming:  Sitting at sink, Wheelchair Upper Body Bathing: Setup Where Assessed-Upper Body Bathing: Sitting at sink, Wheelchair Lower Body Bathing: Moderate assistance Where Assessed-Lower Body Bathing: Sitting at sink, Wheelchair, Standing at sink Upper Body Dressing: Minimal assistance Where Assessed-Upper Body Dressing: Wheelchair Lower Body Dressing: Moderate assistance Where Assessed-Lower Body Dressing: Wheelchair Toileting: Maximal assistance Where Assessed-Toileting: Glass blower/designer: Moderate assistance Toilet Transfer Method: Stand pivot Toilet Transfer Equipment: Raised toilet seat, Grab bars Tub/Shower Transfer: Not assessed ADL Comments: patient declined shower, completed sponge bath seated at sink     Therapy/Group: Individual Therapy  Samuell Knoble A Amri Lien 06/23/2018, 12:39 PM

## 2018-06-24 ENCOUNTER — Inpatient Hospital Stay (HOSPITAL_COMMUNITY): Payer: Medicare HMO

## 2018-06-24 ENCOUNTER — Inpatient Hospital Stay (HOSPITAL_COMMUNITY): Payer: Medicare HMO | Admitting: Occupational Therapy

## 2018-06-24 ENCOUNTER — Inpatient Hospital Stay (HOSPITAL_COMMUNITY): Payer: Medicare HMO | Admitting: Physical Therapy

## 2018-06-24 MED ORDER — SORBITOL 70 % SOLN
60.0000 mL | Status: AC
  Administered 2018-06-24: 60 mL via ORAL
  Filled 2018-06-24: qty 60

## 2018-06-24 MED ORDER — LINACLOTIDE 145 MCG PO CAPS
145.0000 ug | ORAL_CAPSULE | Freq: Every day | ORAL | Status: DC
  Administered 2018-06-25 – 2018-06-26 (×2): 145 ug via ORAL
  Filled 2018-06-24 (×2): qty 1

## 2018-06-24 NOTE — Plan of Care (Signed)
  Problem: Consults Goal: RH SPINAL CORD INJURY PATIENT EDUCATION Description  See Patient Education module for education specifics.  Outcome: Progressing Goal: Skin Care Protocol Initiated - if Braden Score 18 or less Description If consults are not indicated, leave blank or document N/A Outcome: Progressing   Problem: SCI BOWEL ELIMINATION Goal: RH STG MANAGE BOWEL WITH ASSISTANCE Description STG Manage Bowel with mod I Assistance.  Outcome: Progressing Goal: RH STG SCI MANAGE BOWEL WITH MEDICATION WITH ASSISTANCE Description STG SCI Manage bowel with medication with mod I assistance.  Outcome: Progressing   Problem: RH SKIN INTEGRITY Goal: RH STG ABLE TO PERFORM INCISION/WOUND CARE W/ASSISTANCE Description STG Able To Perform Incision/Wound Care With min Assistance.  Outcome: Progressing   Problem: RH SAFETY Goal: RH STG ADHERE TO SAFETY PRECAUTIONS W/ASSISTANCE/DEVICE Description STG Adhere to Safety Precautions With cues/supervision Assistance/Device.  Outcome: Progressing   Problem: RH PAIN MANAGEMENT Goal: RH STG PAIN MANAGED AT OR BELOW PT'S PAIN GOAL Description At or below level 5  Outcome: Progressing   Problem: RH KNOWLEDGE DEFICIT SCI Goal: RH STG INCREASE KNOWLEDGE OF SELF CARE AFTER SCI Description Pt will be able to direct care at discharge independently using handouts/educational resources independently  Outcome: Progressing

## 2018-06-24 NOTE — Progress Notes (Signed)
Occupational Therapy Discharge Summary  Patient Details  Name: Joseph Moyer MRN: [id number] Date of Birth: [birth year]  Today's Date: 06/25/2018 OT Individual Time: 0950-1100 OT Individual Time Calculation (min): 70 min    Patient has met 10 of 11 long term goals due to improved activity tolerance, improved balance, ability to compensate for deficits, functional use of  RIGHT lower extremity and improved coordination.  Patient to discharge at overall Modified Independent level.  Patient's care partner is independent to provide the necessary physical assistance at discharge.    Reasons goals not met: Pt has not met his toileting goal d/t back precautions limiting pt's ability to reach posteriorly for hygiene. Pt reports his wife is able to help with this task at home.   Recommendation:  Patient will benefit from ongoing skilled OT services in home health setting to continue to advance functional skills in the area of BADL.  Equipment: RW, TTB  Reasons for discharge: treatment goals met and discharge from hospital  Patient/family agrees with progress made and goals achieved: Yes   Skilled OT Intervention:  Pt received sitting in w/c, reporting he had already done all ADLs- however d/c level for ADLs listed below. Pt propelled w/c to therapy gym with mod I. He completed functional mobility with RW to stairs ~5 ft with mod I. Pt completed functional stepping with bottom step only with BUE support on rails with (S). When attempting to complete stepping with only 1 UE support to simulate home environment, pt requires min A. Pt cued on which foot to lead with and safety awareness. Pt edu on home accessibility and safest options for functional mobility in the home. Pt was then edu on options for TTB vs shower chair. Pt returned demo for TTB use- completed with (S). Pt was then provided with level 3 green theraband and given demonstration re use. Pt given personalized HEP and reviewed with pt. Pt  was left sitting up in w/c with all needs met.   OT Discharge Precautions/Restrictions  Precautions Precautions: Fall;Back Precaution Booklet Issued: Yes (comment) Required Braces or Orthoses: Spinal Brace Spinal Brace: Lumbar corset;Applied in sitting position Restrictions Weight Bearing Restrictions: No    ADL ADL Eating: Independent Where Assessed-Eating: Wheelchair Grooming: Modified independent Where Assessed-Grooming: Sitting at sink, Wheelchair Upper Body Bathing: Modified independent Where Assessed-Upper Body Bathing: Sitting at sink, Wheelchair Lower Body Bathing: Modified independent Where Assessed-Lower Body Bathing: Sitting at sink, Wheelchair, Standing at sink Upper Body Dressing: Modified independent (Device) Where Assessed-Upper Body Dressing: Wheelchair Lower Body Dressing: Modified independent Where Assessed-Lower Body Dressing: Wheelchair Toileting: Modified independent Where Assessed-Toileting: Glass blower/designer: Modified Programmer, applications Method: Arts development officer: Raised toilet seat, Grab bars Tub/Shower Transfer: Modified independent ADL Comments: patient declined shower, completed sponge bath seated at sink Vision Baseline Vision/History: Wears glasses Wears Glasses: At all times Patient Visual Report: No change from baseline Vision Assessment?: No apparent visual deficits Perception  Perception: Within Functional Limits Praxis Praxis: Intact Cognition Overall Cognitive Status: Within Functional Limits for tasks assessed Arousal/Alertness: Awake/alert Orientation Level: Oriented X4 Safety/Judgment: Appears intact Sensation Sensation Light Touch: Appears Intact Proprioception: Appears Intact Coordination Gross Motor Movements are Fluid and Coordinated: No(impaired 2/2 weakness in RLE) Fine Motor Movements are Fluid and Coordinated: Yes Finger Nose Finger Test: [facility] Motor  Motor Motor: Abnormal postural  alignment and control Mobility  Transfers Sit to Stand: Independent with assistive device Stand to Sit: Independent with assistive device  Trunk/Postural Assessment  Cervical Assessment Cervical Assessment: Within  Functional Limits Thoracic Assessment Thoracic Assessment: Within Functional Limits Lumbar Assessment Lumbar Assessment: Exceptions to WFL(lumbar corset) Postural Control Postural Control: Deficits on evaluation Righting Reactions: delayed  Balance Static Sitting Balance Static Sitting - Balance Support: Feet supported Static Sitting - Level of Assistance: 6: Modified independent (Device/Increase time) Dynamic Standing Balance Dynamic Standing - Level of Assistance: 6: Modified independent (Device/Increase time) Extremity/Trunk Assessment RUE Assessment RUE Assessment: Within Functional Limits LUE Assessment LUE Assessment: Within Functional Limits   Tonny Branch 06/24/2018, 11:07 AM

## 2018-06-24 NOTE — Progress Notes (Signed)
Occupational Therapy Session Note  Patient Details  Name: Joseph Moyer MRN: [id number] Date of Birth: 13-Nov-1940  Today's Date: 06/24/2018 OT Individual Time: 1345-1426 OT Individual Time Calculation (min): 41 min    Short Term Goals: Week 1:  OT Short Term Goal 1 (Week 1): na STG = LTG  Skilled Therapeutic Interventions/Progress Updates:    Patient in bed, states that he has had a lot of therapy today but is willing to participate.  He requests to complete activities to promote right LE strength and stability.  Bed mobility - SSP to/from supine with CS.  LSO brace donn/doff with CS/set up.  SPT with RW to/from bed, w/c, arm chair with CS. Patient able to propel w/c to and from therapy gym.  Completed stepping activity with kinetron 2 x 4 minutes with no resistance.  Completed nustep with LEs only 2 x 2 minutes with focus on keeping right LE midline/hip control.  Reviewed safety, back precautions and moderation with activity.  Patient returned to bed at close of session for a rest break - bed alarm set and call bell in reach.    Therapy Documentation Precautions:  Precautions Precautions: Fall, Back Precaution Booklet Issued: Yes (comment) Required Braces or Orthoses: Spinal Brace Spinal Brace: Lumbar corset, Applied in sitting position Restrictions Weight Bearing Restrictions: No General:   Vital Signs: Therapy Vitals Temp: 98.2 F (36.8 C) Temp Source: Oral Pulse Rate: (!) 108 Resp: 19 BP: (!) 141/84 Patient Position (if appropriate): Sitting Oxygen Therapy SpO2: 98 % O2 Device: Room Air Pain: Pain Assessment Pain Scale: 0-10 Pain Score: 0-No pain Faces Pain Scale: No hurt   Other Treatments:     Therapy/Group: Individual Therapy  Carlos Levering 06/24/2018, 3:44 PM

## 2018-06-24 NOTE — Progress Notes (Signed)
Occupational Therapy Session Note  Patient Details  Name: Joseph Moyer MRN: [id number] Date of Birth: 18-Jul-1940  Today's Date: 06/24/2018 OT Individual Time: 313 110 6106 OT Individual Time Calculation (min): 58 min    Short Term Goals: Week 1:  OT Short Term Goal 1 (Week 1): na STG = LTG  Skilled Therapeutic Interventions/Progress Updates:     1:1. Pt received in bed agreeable to shower. Pt completes ambulatory shower transfer to walk in shower with supervision. Pt bathes seated on TTB with VC for crossing into figure 4 to wash L foot. Pt reports having LHSS at home to wash feet and back. Pt able to don pants with MOD I sit to stand from TTB. Pt dons non skid socks with MOD I using sock aide. Pt seated at sink to brush teeth at sink with MOD I. PT don shirt and orthosis with MOD I. Pt completes kitchen mobility with RW at MOD I level using reacher to grab items from various shelves. OT educates on energy conservation principles, kitchen safety as well as reorganizing home to limit far reaching d/t back precautions. Exited session with pt seated in w/c, call light in reach and all needs met  Therapy Documentation Precautions:  Precautions Precautions: Fall, Back Required Braces or Orthoses: Spinal Brace Spinal Brace: Lumbar corset, Applied in sitting position Restrictions Weight Bearing Restrictions: No General:   Vital Signs:   Pain: Pain Assessment Pain Score: 3  ADL: ADL Eating: Independent Where Assessed-Eating: Wheelchair Grooming: Setup Where Assessed-Grooming: Sitting at sink, Wheelchair Upper Body Bathing: Setup Where Assessed-Upper Body Bathing: Sitting at sink, Wheelchair Lower Body Bathing: Moderate assistance Where Assessed-Lower Body Bathing: Sitting at sink, Wheelchair, Standing at sink Upper Body Dressing: Minimal assistance Where Assessed-Upper Body Dressing: Wheelchair Lower Body Dressing: Moderate assistance Where Assessed-Lower Body Dressing:  Wheelchair Toileting: Maximal assistance Where Assessed-Toileting: Glass blower/designer: Moderate assistance Toilet Transfer Method: Stand pivot Toilet Transfer Equipment: Raised toilet seat, Grab bars Tub/Shower Transfer: Not assessed ADL Comments: patient declined shower, completed sponge bath seated at sink Vision   Perception    Praxis   Exercises:   Other Treatments:     Therapy/Group: Individual Therapy  Tonny Branch 06/24/2018, 8:13 AM

## 2018-06-24 NOTE — Progress Notes (Signed)
Physical Therapy Session Note  Patient Details  Name: Joseph Moyer MRN: [id number] Date of Birth: January 01, 1941  Today's Date: 06/24/2018 PT Individual Time: 9702-6378 and 1435-1530 PT Individual Time Calculation (min): 72 min and 55 min  Short Term Goals: Week 1:  PT Short Term Goal 1 (Week 1): STG = LTG due to short ELOS.  Skilled Therapeutic Interventions/Progress Updates:  Treatment 1: Pt received in bed & agreeable to tx. Discussed home set up with pt reporting he has an air mattress and half bath on lower level where he can stay. Therapist reviewed pt's CLOF and encouraged pt to negotiate stairs to access bed/full bath on upper level of home with pt in agreement. Pt completes bed mobility without rails and bed flat with supervision, recalling back precautions with min cuing. Pt dons corset with set up assist and transfers sit<>stand, stand pivot to w/c with RW & supervision. Pt propels w/c around unit with BUE & supervision. Pt negotiates 4 steps + 4 steps with seated rest break in between, demonstrating compensatory pattern without cuing but therapist reiterating importance of using this technique 2/2 significant RLE weakness. Pt negotiates stairs first with R ascending rail then with L ascending rail and experiences R knee buckling when descending 2/2 RLE weakness and pt requiring min assist overall for activity. After task pt reports he would feel more comfort staying on main level with air mattress and half bath with PT in agreement & encouraged pt to further work on stair negotiation with HHPT. Therapist then provides instructional cuing and demonstration for negotiating 3" step with RW with pt return demonstrating 2x, first with min assist then with close supervision with pt reporting comfort with task simulating home entry. Pt ambulates 150 ft with RW & supervision with improving R hip flexion during swing phase but still very minimal dorsiflexion and heel strike noted. When sitting pt is  able to demonstrate 2/5 hip flexion and dorsiflexion which is an improvement in overall strength but pt still lacks strength to combine all movements for normalized gait pattern. Have asked PA for an AFO consult order prior to pt's d/c this week. In apartment pt performed transfer from rocking recliner with RW & supervision as pt reports he will sit in this at home. Pt then performs seated RLE strengthening exercises including: hip flexion & long arc quads with instructional cuing for technique (unable to clear foot from floor). Pt performed 2 sets of 10x sit<>stand with 1UE support and supervision with task focusing on BLE strengthening. At end of session pt left sitting in w/c with chair alarm donned & all needs in reach. No c/o pain reported during session.   Treatment 2: Pt received in bed reporting "I'm beat" but agreeable to tx. Pt reports 4/10 back pain & meds requested & administered by RN. Pt completes bed mobility with supervision and dons back brace with set up assist. Pt completes sit<>stand and stand pivot transfers bed>w/c and w/c<>mat table with RW & supervision with 1 instance of R knee buckling. Pt propels w/c room<>gym with BUE & supervision and transfers to sitting on mat table. Provided pt with HEP handout focusing on RLE quad strengthening when pt reports need to use restroom. Pt assisted back to room & ambulates in room/bathroom with RW & supervision, completing toilet transfer with supervision, managing clothing without assistance, and pt with continent BM on toilet. Pt required assistance for peri hygiene with therapist discussing how to complete this at home with pt reporting his wife can  assist if needed and PT will ask OT to follow up with pt on task. Pt performs hand hygiene standing at sink with close supervision. Pt returns to standing EOB & performs RLE hip flexion exercises, seated EOB attempts long arc quads but pt unable to clear foot from floor, sit>supine with supervision and  cuing for back precautions, and while supine requires AAROM to perform short arc quads & heels slides. Educated pt on how his wife can provide assistance for AAROM exercises. Pt left in bed with alarm set & all needs at hand.     Therapy Documentation Precautions:  Precautions Precautions: Fall, Back Precaution Booklet Issued: Yes (comment) Required Braces or Orthoses: Spinal Brace Spinal Brace: Lumbar corset, Applied in sitting position Restrictions Weight Bearing Restrictions: No    Therapy/Group: Individual Therapy  Joseph Moyer 06/24/2018, 3:32 PM

## 2018-06-24 NOTE — Plan of Care (Signed)
  Problem: Consults Goal: RH SPINAL CORD INJURY PATIENT EDUCATION Description  See Patient Education module for education specifics.  06/24/2018 1233 by Adria Devon, LPN Outcome: Progressing 06/24/2018 1209 by Ellison Carwin A, LPN Outcome: Progressing Goal: Skin Care Protocol Initiated - if Braden Score 18 or less Description If consults are not indicated, leave blank or document N/A 06/24/2018 1233 by Adria Devon, LPN Outcome: Progressing 06/24/2018 1209 by Ellison Carwin A, LPN Outcome: Progressing   Problem: SCI BOWEL ELIMINATION Goal: RH STG MANAGE BOWEL WITH ASSISTANCE Description STG Manage Bowel with mod I Assistance.  06/24/2018 1233 by Adria Devon, LPN Outcome: Progressing 06/24/2018 1209 by Ellison Carwin A, LPN Outcome: Progressing Goal: RH STG SCI MANAGE BOWEL WITH MEDICATION WITH ASSISTANCE Description STG SCI Manage bowel with medication with mod I assistance.  06/24/2018 1233 by Adria Devon, LPN Outcome: Progressing 06/24/2018 1209 by Adria Devon, LPN Outcome: Progressing   Problem: RH SKIN INTEGRITY Goal: RH STG ABLE TO PERFORM INCISION/WOUND CARE W/ASSISTANCE Description STG Able To Perform Incision/Wound Care With min Assistance.  06/24/2018 1233 by Adria Devon, LPN Outcome: Progressing 06/24/2018 1209 by Ellison Carwin A, LPN Outcome: Progressing   Problem: RH SAFETY Goal: RH STG ADHERE TO SAFETY PRECAUTIONS W/ASSISTANCE/DEVICE Description STG Adhere to Safety Precautions With cues/supervision Assistance/Device.  06/24/2018 1233 by Adria Devon, LPN Outcome: Progressing 06/24/2018 1209 by Ellison Carwin A, LPN Outcome: Progressing   Problem: RH PAIN MANAGEMENT Goal: RH STG PAIN MANAGED AT OR BELOW PT'S PAIN GOAL Description At or below level 5  06/24/2018 1233 by Ellison Carwin A, LPN Outcome: Progressing 06/24/2018 1209 by Ellison Carwin A, LPN Outcome: Progressing   Problem: RH KNOWLEDGE  DEFICIT SCI Goal: RH STG INCREASE KNOWLEDGE OF SELF CARE AFTER SCI Description Pt will be able to direct care at discharge independently using handouts/educational resources independently  06/24/2018 1233 by Adria Devon, LPN Outcome: Progressing 06/24/2018 1209 by Adria Devon, LPN Outcome: Progressing

## 2018-06-24 NOTE — Progress Notes (Signed)
[facility] PHYSICAL MEDICINE & REHABILITATION PROGRESS NOTE   Subjective/Complaints: Itching all over, we discussed that this is most likely his pain medication.  He is reluctant to back off his pain medication  ROS: Patient denies fever, rash, sore throat, blurred vision, nausea, vomiting, diarrhea, cough, shortness of breath or chest pain, joint or back pain, headache, or mood change.     Objective:   No results found. No results for input(s): WBC, HGB, HCT, PLT in the last 72 hours. No results for input(s): NA, K, CL, CO2, GLUCOSE, BUN, CREATININE, CALCIUM in the last 72 hours.  Intake/Output Summary (Last 24 hours) at 06/24/2018 1017 Last data filed at 06/23/2018 1824 Gross per 24 hour  Intake 698 ml  Output -  Net 698 ml     Physical Exam: Vital Signs Blood pressure 138/64, pulse 91, temperature 98 F (36.7 C), temperature source Oral, resp. rate 16, height 5\' 11"  (1.803 m), weight 84.8 kg, SpO2 95 %. Constitutional: No distress . Vital signs reviewed. HEENT: EOMI, oral membranes moist Neck: supple Cardiovascular: RRR without murmur. No JVD    Respiratory: CTA Bilaterally without wheezes or rales. Normal effort    GI: BS +, non-tender, non-distended  Musculoskeletal:  General: No edema.  Comments: LB TTP, in LSO. Neurological: He is alertand oriented to person, place, and time. Patient is alert in no acute distress. Follows full commands. UE 5/5. RLE: HF 2+, KE 1+ to 2- at best , ADF 3- to 3/5, APF 4/5, KF 4/5, HE 4/5. LLE:   4/5.---some improvement in motor exam.  Decreased sensation over primarily over right L2, L3 dermatomes remains present. DTR's absent in LE's.  Skin: Skin iswarm. He isnot diaphoretic. There is no rash, there are scratch marks, skin is a little dry Back incision remains CDIwith honeycomb dressing Psychiatric: pleasant   Assessment/Plan: 1. Functional deficits secondary to lumbar stenosis/HNP with conus syndrome which require 3+  hours per day of interdisciplinary therapy in a comprehensive inpatient rehab setting.  Physiatrist is providing close team supervision and 24 hour management of active medical problems listed below.  Physiatrist and rehab team continue to assess barriers to discharge/monitor patient progress toward functional and medical goals  Care Tool:  Bathing  Bathing activity did not occur: Refused Body parts bathed by patient: Right arm, Left arm, Chest, Abdomen, Front perineal area, Face, Right upper leg, Left upper leg, Buttocks   Body parts bathed by helper: Right lower leg, Left lower leg     Bathing assist Assist Level: Minimal Assistance - Patient > 75%     Upper Body Dressing/Undressing Upper body dressing   What is the patient wearing?: Pull over shirt, Orthosis    Upper body assist Assist Level: Set up assist    Lower Body Dressing/Undressing Lower body dressing      What is the patient wearing?: Pants     Lower body assist Assist for lower body dressing: Supervision/Verbal cueing     Toileting Toileting Toileting Activity did not occur (Clothing management and hygiene only): Safety/medical concerns  Toileting assist Assist for toileting: Minimal Assistance - Patient > 75%     Transfers Chair/bed transfer  Transfers assist  Chair/bed transfer activity did not occur: Safety/medical concerns  Chair/bed transfer assist level: Contact Guard/Touching assist     Locomotion Ambulation   Ambulation assist   Ambulation activity did not occur: Safety/medical concerns  Assist level: Contact Guard/Touching assist Assistive device: Walker-rolling Max distance: 38ft   Walk 10 feet activity  Assist  Walk 10 feet activity did not occur: Safety/medical concerns  Assist level: Contact Guard/Touching assist Assistive device: Walker-rolling   Walk 50 feet activity   Assist Walk 50 feet with 2 turns activity did not occur: Safety/medical concerns  Assist level:  Contact Guard/Touching assist Assistive device: Walker-rolling    Walk 150 feet activity   Assist Walk 150 feet activity did not occur: Safety/medical concerns         Walk 10 feet on uneven surface  activity   Assist Walk 10 feet on uneven surfaces activity did not occur: Safety/medical concerns         Wheelchair     Assist Will patient use wheelchair at discharge?: Yes Type of Wheelchair: Manual    Wheelchair assist level: Supervision/Verbal cueing Max wheelchair distance: 150'    Wheelchair 50 feet with 2 turns activity    Assist        Assist Level: Supervision/Verbal cueing   Wheelchair 150 feet activity     Assist     Assist Level: Supervision/Verbal cueing    Medical Problem List and Plan: 1.Decreased functional mobilitysecondary to severemultifactorialspinal stenosis 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 Status post L1-2 bilateral discectomy with left hemilaminotomy 06/15/2018. Lumbar corset when out of bed -Continue CIR therapies including PT, OT     -has been ambulating with RW and without an orthotic. Uses a lot of upper body. Still suspect he will need RLE orthotic at some point 2. Antithrombotics: -DVT/anticoagulation:Subcutaneous Lovenox. dopplers negative -antiplatelet therapy: N/A 3. Pain Management:Neurontin 300 mg 3 times daily, Robaxin and oxycodone as needed 4. Mood:Provide emotional support -antipsychotic agents: N/A 5. Neuropsych: This patientiscapable of making decisions on hisown behalf. 6. Skin/Wound Care:Routine skin checks 7. Fluids/Electrolytes/Nutrition:encourage PO    8. Acute blood loss anemia. hgb sl down to 9.6  -no overt signs of blood loss  -follow up Monday 9.Hypertension. Patient on Norvasc 5 mg daily prior to admission. Resume as needed 10. BPH. Resumed Flomax 0.4 mg daily. -pt states  he's been voiding without significant difficulty 11. Hyperlipidemia. Resume Zocor 12. 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    -begin linzess tomorrow morning 13.  Pruritus secondary to oxycodone, sarna, benadryl  LOS: 7 days A FACE TO FACE EVALUATION WAS PERFORMED  Meredith Staggers 06/24/2018, 10:17 AM

## 2018-06-25 ENCOUNTER — Inpatient Hospital Stay (HOSPITAL_COMMUNITY): Payer: Medicare HMO

## 2018-06-25 ENCOUNTER — Inpatient Hospital Stay (HOSPITAL_COMMUNITY): Payer: Medicare HMO | Admitting: Physical Therapy

## 2018-06-25 MED ORDER — METHOCARBAMOL 500 MG PO TABS: 500.0000 mg | ORAL_TABLET | Freq: Four times a day (QID) | ORAL | 0 refills | Status: DC | PRN

## 2018-06-25 MED ORDER — HYDROCODONE-ACETAMINOPHEN 5-325 MG PO TABS
1.0000 | ORAL_TABLET | ORAL | Status: DC | PRN
  Administered 2018-06-25 – 2018-06-26 (×4): 1 via ORAL
  Filled 2018-06-25 (×4): qty 1

## 2018-06-25 MED ORDER — LINACLOTIDE 145 MCG PO CAPS: 145.0000 ug | ORAL_CAPSULE | Freq: Every day | ORAL | 1 refills | Status: DC

## 2018-06-25 MED ORDER — ACETAMINOPHEN 325 MG PO TABS: 650.0000 mg | ORAL_TABLET | ORAL | Status: DC | PRN

## 2018-06-25 MED ORDER — ADULT MULTIVITAMIN W/MINERALS CH: 1.0000 | ORAL_TABLET | Freq: Every day | ORAL | Status: DC

## 2018-06-25 MED ORDER — FERROUS SULFATE 325 (65 FE) MG PO TABS: 325.0000 mg | ORAL_TABLET | Freq: Two times a day (BID) | ORAL | 3 refills | Status: DC

## 2018-06-25 MED ORDER — HYDROCODONE-ACETAMINOPHEN 5-325 MG PO TABS: 1.0000 | ORAL_TABLET | ORAL | 0 refills | Status: DC | PRN

## 2018-06-25 MED ORDER — GABAPENTIN 300 MG PO CAPS: 300.0000 mg | ORAL_CAPSULE | Freq: Three times a day (TID) | ORAL | 1 refills | Status: DC

## 2018-06-25 MED ORDER — POLYETHYLENE GLYCOL 3350 17 G PO PACK: 17.0000 g | PACK | Freq: Every day | ORAL | 0 refills | Status: DC

## 2018-06-25 MED ORDER — SENNOSIDES-DOCUSATE SODIUM 8.6-50 MG PO TABS: 2.0000 | ORAL_TABLET | Freq: Every day | ORAL | Status: DC

## 2018-06-25 NOTE — Progress Notes (Signed)
Physical Therapy Discharge Summary  Patient Details  Name: Joseph Moyer MRN: [id number] Date of Birth: 04/06/1940  Today's Date: 06/26/2018   Patient has met 9 of 9 long term goals due to improved activity tolerance, improved balance, improved postural control, increased strength, increased range of motion, ability to compensate for deficits, functional use of  right lower extremity, improved awareness and improved coordination.  Patient to discharge at an ambulatory level supervision with RW.   Patient's care partner is independent to provide the necessary physical assistance at discharge.  Reasons goals not met: n/a  Recommendation:  Patient will benefit from ongoing skilled PT services in home health setting to continue to advance safe functional mobility, address ongoing impairments in balance, R neuromuscular re-education, endurance, RLE strengthening, gait, stair negotiation, and minimize fall risk.  Equipment: RW, R foot-up brace  Reasons for discharge: treatment goals met and discharge from hospital  Patient/family agrees with progress made and goals achieved: Yes  PT Discharge Precautions/Restrictions Precautions Precautions: Fall;Back Precaution Booklet Issued: Yes (comment) Required Braces or Orthoses: Spinal Brace Spinal Brace: Lumbar corset;Applied in sitting position Restrictions Weight Bearing Restrictions: No   Vision/Perception  Pt wears glasses at all times. No changes in baseline vision. Perception WNL.  Cognition Overall Cognitive Status: Within Functional Limits for tasks assessed Arousal/Alertness: Awake/alert Orientation Level: Oriented X4 Sustained Attention: Appears intact Memory: Appears intact Awareness: Appears intact Problem Solving: Appears intact Safety/Judgment: Appears intact  Sensation Sensation Light Touch: Appears Intact Proprioception: Appears Intact Coordination Gross Motor Movements are Fluid and Coordinated: No(impaired  2/2 weakness in RLE) Fine Motor Movements are Fluid and Coordinated: Yes   Motor  Motor Motor: Abnormal postural alignment and control Motor - Discharge Observations: generalized deconditioning, RLE weak   Mobility Bed Mobility Bed Mobility: Rolling Right;Rolling Left Rolling Right: Supervision/verbal cueing Rolling Left: Supervision/Verbal cueing Sit<>sidelying with supervision Transfers Transfers: Sit to Stand;Stand to Sit Sit to Stand: Supervision/Verbal cueing Stand to Sit: Supervision/Verbal cueing Transfer (Assistive device): Rolling walker   Locomotion  Gait Ambulation: Yes Gait Assistance: Supervision/Verbal cueing Gait Distance (Feet): 150 Feet Assistive device: Rolling walker Gait Gait: Yes Gait Pattern: Impaired Gait Pattern: Decreased hip/knee flexion - right;Decreased dorsiflexion - right;Decreased stride length;Decreased step length - right;Decreased step length - left(decreased heel strike RLE) Gait velocity: decreased Stairs / Additional Locomotion Stairs: Yes Stairs Assistance: Supervision/Verbal cueing Stair Management Technique: (1 rail) Number of Stairs: 8 Height of Stairs: 6(inches) Ramp: Supervision/Verbal cueing(ambulatory with RW) Curb: Supervision/Verbal cueing(with RW) Architect: Yes Wheelchair Assistance: Independent with Camera operator: Both upper extremities Wheelchair Parts Management: Needs assistance Distance: 150 ft    Trunk/Postural Assessment  Cervical Assessment Cervical Assessment: Within Functional Limits Thoracic Assessment Thoracic Assessment: Within Functional Limits Lumbar Assessment Lumbar Assessment: Exceptions to WFL(lumbar corset) Postural Control Postural Control: Deficits on evaluation Righting Reactions: delayed Protective Responses: delayed   Balance Balance Balance Assessed: Yes Dynamic Standing Balance Dynamic Standing - Balance Support: During  functional activity;Bilateral upper extremity supported(gait with RW, washing hands without BUE support) Dynamic Standing - Level of Assistance: 5: Stand by assistance   Extremity Assessment  BUE & LLE WFL  RLE Assessment RLE Assessment: Exceptions to [facility] Passive Range of Motion (PROM) Comments: [facility] RLE Strength Right Hip Flexion: 2/5 Right Knee Flexion: 2/5 Right Ankle Dorsiflexion: 2/5    Shephanie Romas M Aniyha Tate 06/26/2018, 9:00 AM

## 2018-06-25 NOTE — Progress Notes (Signed)
Physical Therapy Session Note  Patient Details  Name: Joseph Moyer MRN: [id number] Date of Birth: 03/17/1940  Today's Date: 06/25/2018 PT Individual Time: 1133-1203 PT Individual Time Calculation (min): 30 min   Short Term Goals: Week 1:  PT Short Term Goal 1 (Week 1): STG = LTG due to short ELOS.  Skilled Therapeutic Interventions/Progress Updates:    Pt received sitting in w/c and agreeable to therapy session. Transported to/from gym in w/c for time management and energy conservation. Performed ambulatory car transfer using RW (sedan height) with supervision throughout and pt demonstrating ability to manage R LE in/out of vehicle with B UE assist. Performed recliner transfer using RW with supervision. Ambulated 138ft using RW with supervision for safety and cuing for increased R LE hip flexion for improved foot clearance. Ascended/descended 4 steps using R HR and 4 steps using L HR all with close supervision for safety and pt demonstrating proper sequencing for step-to pattern to maintain balance - attempted stepping up and backwards down 1 step with B UE support on R HR; however, pt's R knee buckled requiring mod assist to maintain upright. Transported back to room and left sitting in w/c with needs in reach and chair alarm on.   Therapy Documentation Precautions:  Precautions Precautions: Fall, Back Precaution Booklet Issued: Yes (comment) Required Braces or Orthoses: Spinal Brace Spinal Brace: Lumbar corset, Applied in sitting position Restrictions Weight Bearing Restrictions: No  Pain: No reports of pain throughout therapy session.   Therapy/Group: Individual Therapy  Tawana Scale, PT, DPT 06/25/2018, 7:51 AM

## 2018-06-25 NOTE — Progress Notes (Signed)
Physical Therapy Session Note  Patient Details  Name: SCHNEIDER WARCHOL MRN: [id number] Date of Birth: [birth year]  Today's Date: 06/25/2018 PT Individual Time: 1630-1700 PT Individual Time Calculation (min): 30 min   Short Term Goals: Week 1:  PT Short Term Goal 1 (Week 1): STG = LTG due to short ELOS.  Skilled Therapeutic Interventions/Progress Updates:    Pt supine in bed upon PT arrival, agreeable to therapy tx and denies pain. Pt transferred to sitting with supervision and donned brace and socks with supervision, used sock aid to don socks. Therapist assisted with donning shoes, discussed getting toe up brace tomorrow AM before discharging. Pt performed stand pivot to w/c with supervision and transported to the gym. Pt ambulated x 120 ft and x 80 ft this session with RW and supervision. Pt performed x 5 sit<>stands without AD, pushing up with UEs to stand and then emphasis on eccentric control without UE support during sitting for LE strengthening. Pt transported back to room and transferred to bed with supervision. Pt left supine with needs in reach and bed alarm set.   Therapy Documentation Precautions:  Precautions Precautions: Fall, Back Precaution Booklet Issued: Yes (comment) Required Braces or Orthoses: Spinal Brace Spinal Brace: Lumbar corset, Applied in sitting position Restrictions Weight Bearing Restrictions: No    Therapy/Group: Individual Therapy  Netta Corrigan, PT, DPT 06/25/2018, 12:45 PM

## 2018-06-25 NOTE — Progress Notes (Signed)
Orthopedic Tech Progress Note Patient Details:  Joseph Moyer [birth year] [id number]  Patient ID: Joseph Moyer, male   DOB: 1940-06-07, 78 y.o.   MRN: [id number]   [name] 06/25/2018, 8:14 AMCalled [employer] for right AFO brace.

## 2018-06-25 NOTE — Progress Notes (Signed)
Physical Therapy Session Note  Patient Details  Name: Joseph Moyer MRN: [id number] Date of Birth: 09-30-1940  Today's Date: 06/25/2018 PT Individual Time: 1249-1400 PT Individual Time Calculation (min): 71 min   Short Term Goals: Week 1:  PT Short Term Goal 1 (Week 1): STG = LTG due to short ELOS.  Skilled Therapeutic Interventions/Progress Updates:  Pt received in w/c & agreeable to tx. Educated pt on scrubbing bubbles wand and ability to modify it to use it to perform peri hygiene PRN or pt's wife will assist him. Contacted pt's wife & educated her on pt's CLOF (supervision with RW), need to remove tripping hazards/throw rugs in floor, pt wishing to stay on lower level and continue to work on negotiating stairs with HHPT, reviewed HHPT f/u and DME recommendations (w/c for community use only), back precautions & need to wear back brace & don it sitting EOB, and potential need for wife to assist with donning R AFO should pt d/c home with one. Pt & wife voice no concerns regarding d/c home. Pt propels w/c room>ortho gym with BUE & mod I. Pt negotiates ramp, uneven surface, and curb with RW & close supervision. [employer] account manager) arrives and observes pt ambulating with RW & supervision progressing to CGA 2/2 fatigue with pt demonstrating improvement in step through pattern and hip flexion during swing phase but pt more impulsive with increased gait speed. Gerald Stabs recommends trying foot up brace or PLS and will return this afternoon to trial braces after pt's wife brings tennis shoes (contacted her & she said she will have them here within the hour). Pt ambulates room<>dayroom with RW & supervision with decreased gait speed & improved safety awareness. Pt utilizes nu-step on level 3 x 10 minutes with all four extremities with task focusing on global and RLE strengthening. Pt requires extra effort to maintain neutral alignment of R knee and requires 4 rest breaks for entire task 2/2 fatigue. At end  of session pt left in bed with alarm set & all needs at hand.  Pt reports 3/10 back pain at beginning of session but declines meds at this time, but RN does administer routine meds during session.    Therapy Documentation Precautions:  Precautions Precautions: Fall, Back Precaution Booklet Issued: Yes (comment) Required Braces or Orthoses: Spinal Brace Spinal Brace: Lumbar corset, Applied in sitting position Restrictions Weight Bearing Restrictions: No    Therapy/Group: Individual Therapy  Waunita Schooner 06/25/2018, 2:00 PM

## 2018-06-25 NOTE — Progress Notes (Signed)
[facility] PHYSICAL MEDICINE & REHABILITATION PROGRESS NOTE   Subjective/Complaints: Complains of diffuse itching still. sarna helps as does benadryl. Still constipated  ROS: Patient denies fever, rash, sore throat, blurred vision, nausea, vomiting, diarrhea, cough, shortness of breath or chest pain,   headache, or mood change.     Objective:   No results found. No results for input(s): WBC, HGB, HCT, PLT in the last 72 hours. No results for input(s): NA, K, CL, CO2, GLUCOSE, BUN, CREATININE, CALCIUM in the last 72 hours.  Intake/Output Summary (Last 24 hours) at 06/25/2018 1304 Last data filed at 06/25/2018 0838 Gross per 24 hour  Intake 236 ml  Output 650 ml  Net -414 ml     Physical Exam: Vital Signs Blood pressure 139/64, pulse 96, temperature 98.3 F (36.8 C), temperature source Oral, resp. rate 16, height 5\' 11"  (1.803 m), weight 87.2 kg, SpO2 99 %. Constitutional: No distress . Vital signs reviewed. HEENT: EOMI, oral membranes moist Neck: supple Cardiovascular: RRR without murmur. No JVD    Respiratory: CTA Bilaterally without wheezes or rales. Normal effort    GI: BS +, non-tender, non-distended  Musculoskeletal:  General: No edema.  Comments: LB TTP, in LSO. Neurological: He is alertand oriented to person, place, and time. Patient is alert in no acute distress. Follows full commands. UE 5/5. RLE: HF 2+, KE 1+ to 2- at best , ADF 3- to 3/5, APF 4/5, KF 4/5, HE 4/5===really not a lot of change yet. LLE:   4/5.---some improvement in motor exam.  Decreased sensation over primarily over right L2, L3 dermatomes still present.  DTR's absent in LE's.  Skin: Skin iswarm. He isnot diaphoretic. There is no rash, there are scratch marks, skin is a little dry Back incision remains CDI  Psychiatric: pleasant, a little anxious   Assessment/Plan: 1. Functional deficits secondary to lumbar stenosis/HNP with conus syndrome which require 3+ hours per day of  interdisciplinary therapy in a comprehensive inpatient rehab setting.  Physiatrist is providing close team supervision and 24 hour management of active medical problems listed below.  Physiatrist and rehab team continue to assess barriers to discharge/monitor patient progress toward functional and medical goals  Care Tool:  Bathing  Bathing activity did not occur: Refused Body parts bathed by patient: Right arm, Left arm, Chest, Abdomen, Front perineal area, Face, Right upper leg, Left upper leg, Buttocks, Left lower leg   Body parts bathed by helper: Right lower leg, Left lower leg Body parts n/a: Right lower leg   Bathing assist Assist Level: Contact Guard/Touching assist     Upper Body Dressing/Undressing Upper body dressing   What is the patient wearing?: Pull over shirt, Orthosis    Upper body assist Assist Level: Independent with assistive device    Lower Body Dressing/Undressing Lower body dressing      What is the patient wearing?: Pants     Lower body assist Assist for lower body dressing: Independent with assitive device     Toileting Toileting Toileting Activity did not occur (Clothing management and hygiene only): Safety/medical concerns  Toileting assist Assist for toileting: Minimal Assistance - Patient > 75%     Transfers Chair/bed transfer  Transfers assist  Chair/bed transfer activity did not occur: Safety/medical concerns  Chair/bed transfer assist level: Supervision/Verbal cueing     Locomotion Ambulation   Ambulation assist   Ambulation activity did not occur: Safety/medical concerns  Assist level: Supervision/Verbal cueing Assistive device: Walker-rolling Max distance: 143ft   Walk 10 feet activity  Assist  Walk 10 feet activity did not occur: Safety/medical concerns  Assist level: Supervision/Verbal cueing Assistive device: Walker-rolling   Walk 50 feet activity   Assist Walk 50 feet with 2 turns activity did not occur:  Safety/medical concerns  Assist level: Supervision/Verbal cueing Assistive device: Walker-rolling    Walk 150 feet activity   Assist Walk 150 feet activity did not occur: Safety/medical concerns  Assist level: Supervision/Verbal cueing Assistive device: Walker-rolling    Walk 10 feet on uneven surface  activity   Assist Walk 10 feet on uneven surfaces activity did not occur: Safety/medical concerns         Wheelchair     Assist Will patient use wheelchair at discharge?: No Type of Wheelchair: Manual    Wheelchair assist level: Supervision/Verbal cueing Max wheelchair distance: 150'    Wheelchair 50 feet with 2 turns activity    Assist        Assist Level: Supervision/Verbal cueing   Wheelchair 150 feet activity     Assist     Assist Level: Supervision/Verbal cueing    Medical Problem List and Plan: 1.Decreased functional mobilitysecondary to severemultifactorialspinal stenosis 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 Status post L1-2 bilateral discectomy with left hemilaminotomy 06/15/2018. Lumbar corset when out of bed -Continue CIR therapies including PT, OT   -ELOS 5/27  -AFO RLE  -Patient to see Rehab MD/provider in the office for transitional care encounter in 2-4 weeks.   -has been ambulating with RW and without an orthotic. Uses a lot of upper body. Still suspect he will need RLE orthotic at some point 2. Antithrombotics: -DVT/anticoagulation:Subcutaneous Lovenox. dopplers negative -antiplatelet therapy: N/A 3. Pain Management:Neurontin 300 mg 3 times daily, Robaxin and oxycodone as needed 4. Mood:Provide emotional support -antipsychotic agents: N/A 5. Neuropsych: This patientiscapable of making decisions on hisown behalf. 6. Skin/Wound Care:Routine skin checks 7. Fluids/Electrolytes/Nutrition:encourage PO    8. Acute blood loss anemia. hgb  sl down to 9.6  -no overt signs of blood loss  -recheck tomorrow prior to discharge 9.Hypertension. Patient on Norvasc 5 mg daily prior to admission. Resume as needed 10. BPH. Resumed Flomax 0.4 mg daily. -pt states he's been voiding without significant difficulty 11. Hyperlipidemia. Resume Zocor 12. Constipation vs Neurogenic bowel: continue to augment bowel regimen    - -linzess trial   -had bm this morning. 13.  Pruritus secondary to oxycodone, sarna, benadryl   -dc oxycodone   -hydrocodone trial LOS: 8 days A FACE TO FACE EVALUATION WAS PERFORMED  Meredith Staggers 06/25/2018, 1:04 PM

## 2018-06-25 NOTE — Discharge Summary (Signed)
Physician Discharge Summary  Patient ID: Joseph Moyer MRN: [id number] DOB/AGE: 07/04/1940 78 y.o.  Admit date: 06/17/2018 Discharge date: 06/26/2018  Discharge Diagnoses:  Active Problems:   Myelopathy (HCC) DVT prophylaxis Pain management Acute blood loss anemia Hypertension BPH Hyperlipidemia Constipation versus neurogenic bowel  Discharged Condition: stable  Significant Diagnostic Studies: X-ray Chest Pa And Lateral  Result Date: 06/14/2018 CLINICAL DATA:  Preoperative examination. EXAM: CHEST - 2 VIEW COMPARISON:  Chest x-ray dated July 30, 2014. FINDINGS: The heart size and mediastinal contours are within normal limits. Both lungs are clear. The visualized skeletal structures are unremarkable. IMPRESSION: No active cardiopulmonary disease. Electronically Signed   By: Titus Dubin M.D.   On: 06/14/2018 14:41   Dg Lumbar Spine 2-3 Views  Result Date: 06/15/2018 CLINICAL DATA:  L1-L2 posterior decompression EXAM: DG C-ARM 61-120 MIN; LUMBAR SPINE - 2-3 VIEW FLUOROSCOPY TIME:  2 minutes, 13 seconds COMPARISON:  Lumbar spine MRI-06/14/2018 FINDINGS: Four spot cone fluoroscopic images of the lumbar spine are provided for review including dedicated lateral projection of the lumbosacral junction. Provided images demonstrate sequela of L1-L2 paraspinal fusion. Alignment appears anatomic. Expected subcutaneous emphysema about the operative site. No radiopaque foreign body. IMPRESSION: Post L1-L2 paraspinal fusion without evidence of complication. Electronically Signed   By: Sandi Mariscal M.D.   On: 06/15/2018 14:54   Mr Lumbar Spine W Wo Contrast  Result Date: 06/14/2018 CLINICAL DATA:  Acute low back pain with bilateral leg pain and weakness since lifting a heavy bag in the yard 2 weeks ago. EXAM: MRI LUMBAR SPINE WITHOUT AND WITH CONTRAST TECHNIQUE: Multiplanar and multiecho pulse sequences of the lumbar spine were obtained without and with intravenous contrast. CONTRAST:  9 mL  Gadavist intravenous contrast. COMPARISON:  MR lumbar spine dated July 24, 2014. FINDINGS: Segmentation:  Standard. Alignment:  Unchanged trace retrolisthesis at L5-S1. Vertebrae: No fracture, evidence of discitis, or bone lesion. Unchanged chronic degenerative fatty marrow endplate changes at Y0-D9 and L5-S1. Conus medullaris and cauda equina: Conus extends to the L1-L2 level. No intradural enhancement. Paraspinal and other soft tissues: Negative. Disc levels: T11-T12: Negative. T12-L1: Negative disc. Mild bilateral facet arthropathy. No stenosis. L1-L2: New large central and left-sided disc extrusion migrating both superiorly and inferiorly, measuring 2.6 cm in craniocaudal dimension. This results in new severe spinal canal stenosis with posterior displacement of the conus. A small amount of disc material extends into the left neural foramen (series 6, image 9). Unchanged mild bilateral facet arthropathy. L2-L3: Central and left paracentral disc protrusion. Moderate to severe bilateral facet arthropathy. Progressive moderate to severe central spinal canal and lateral recess stenosis. Unchanged mild bilateral neuroforaminal stenosis. L3-L4: Unchanged small circumferential disc osteophyte complex. Unchanged moderate bilateral facet arthropathy. Unchanged borderline mild spinal canal stenosis. Unchanged mild bilateral neuroforaminal stenosis. L4-L5: Unchanged small circumferential disc osteophyte complex and mild bilateral facet arthropathy. Unchanged mild left lateral recess stenosis. Unchanged moderate bilateral neuroforaminal stenosis. No spinal canal stenosis. L5-S1: Unchanged circumferential disc osteophyte complex and mild bilateral facet arthropathy. Unchanged moderate to severe left and moderate right neuroforaminal stenosis. No spinal canal stenosis. IMPRESSION: 1. New large central and left-sided disc extrusion at L1-L2 migrating both superiorly and inferiorly, resulting in severe spinal canal stenosis with  posterior displacement of the conus. 2. Progressive moderate to severe spinal canal and lateral recess stenosis at L2-L3. 3. Remaining multilevel lumbar spondylosis as described above is similar to prior study, including moderate to severe left neuroforaminal stenosis at L5-S1. Electronically Signed   By: Titus Dubin  M.D.   On: 06/14/2018 16:54   Dg C-arm 1-60 Min  Result Date: 06/15/2018 CLINICAL DATA:  L1-L2 posterior decompression EXAM: DG C-ARM 61-120 MIN; LUMBAR SPINE - 2-3 VIEW FLUOROSCOPY TIME:  2 minutes, 13 seconds COMPARISON:  Lumbar spine MRI-06/14/2018 FINDINGS: Four spot cone fluoroscopic images of the lumbar spine are provided for review including dedicated lateral projection of the lumbosacral junction. Provided images demonstrate sequela of L1-L2 paraspinal fusion. Alignment appears anatomic. Expected subcutaneous emphysema about the operative site. No radiopaque foreign body. IMPRESSION: Post L1-L2 paraspinal fusion without evidence of complication. Electronically Signed   By: Sandi Mariscal M.D.   On: 06/15/2018 14:54   Vas [place] Lower Extremity Venous (dvt)  Result Date: 06/18/2018  Lower Venous Study Indications: Swelling.  Performing Technologist: Oliver Hum RVT  Examination Guidelines: A complete evaluation includes B-mode imaging, spectral Doppler, color Doppler, and power Doppler as needed of all accessible portions of each vessel. Bilateral testing is considered an integral part of a complete examination. Limited examinations for reoccurring indications may be performed as noted.  +---------+---------------+---------+-----------+----------+-------+ RIGHT    CompressibilityPhasicitySpontaneityPropertiesSummary +---------+---------------+---------+-----------+----------+-------+ CFV      Full           Yes      Yes                          +---------+---------------+---------+-----------+----------+-------+ SFJ      Full                                                  +---------+---------------+---------+-----------+----------+-------+ FV Prox  Full                                                 +---------+---------------+---------+-----------+----------+-------+ FV Mid   Full                                                 +---------+---------------+---------+-----------+----------+-------+ FV DistalFull                                                 +---------+---------------+---------+-----------+----------+-------+ PFV      Full                                                 +---------+---------------+---------+-----------+----------+-------+ POP      Full           Yes      Yes                          +---------+---------------+---------+-----------+----------+-------+ PTV      Full                                                 +---------+---------------+---------+-----------+----------+-------+  PERO     Full                                                 +---------+---------------+---------+-----------+----------+-------+   +---------+---------------+---------+-----------+----------+-------+ LEFT     CompressibilityPhasicitySpontaneityPropertiesSummary +---------+---------------+---------+-----------+----------+-------+ CFV      Full           Yes      Yes                          +---------+---------------+---------+-----------+----------+-------+ SFJ      Full                                                 +---------+---------------+---------+-----------+----------+-------+ FV Prox  Full                                                 +---------+---------------+---------+-----------+----------+-------+ FV Mid   Full                                                 +---------+---------------+---------+-----------+----------+-------+ FV DistalFull                                                 +---------+---------------+---------+-----------+----------+-------+ PFV       Full                                                 +---------+---------------+---------+-----------+----------+-------+ POP      Full           Yes      Yes                          +---------+---------------+---------+-----------+----------+-------+ PTV      Full                                                 +---------+---------------+---------+-----------+----------+-------+ PERO     Full                                                 +---------+---------------+---------+-----------+----------+-------+     Summary: Right: There is no evidence of deep vein thrombosis in the lower extremity. No cystic structure found in the popliteal fossa. Left: There is no evidence of deep vein thrombosis in the lower extremity. No cystic structure found in the popliteal fossa.  *See table(s) above for measurements  and observations. Electronically signed by Harold Barban MD on 06/18/2018 at 2:50:18 PM.    Final     Labs:  Basic Metabolic Panel: No results for input(s): NA, K, CL, CO2, GLUCOSE, BUN, CREATININE, CALCIUM, MG, PHOS in the last 168 hours.  CBC: No results for input(s): WBC, NEUTROABS, HGB, HCT, MCV, PLT in the last 168 hours.  CBG: No results for input(s): GLUCAP in the last 168 hours.  Family history.  Mother sister and brother with diabetes.  Denies any cancer or hypertension  Brief HPI:    Savior L. Leichter is a 78 year old right-handed male with history of hypertension hyperlipidemia.  Lives with spouse independent prior to admission.  Presented 06/14/2018 with increasing low back pain radiating to the lower extremities right greater than left times several weeks after lifting a bag of fertilizer.  He denied any bowel or bladder disturbances.  X-rays and imaging revealed severe spinal stenosis L1-2 secondary to large disc central herniation radiculopathy.  Left lateral recess stenosis L2-3.  Underwent bilateral L1-2 discectomy with left hemilaminotomy and lateral  recess decompression 06/15/2018 per Dr. Rolena Infante.  Hospital course pain management.  Lumbar corset when out of bed.  Subcutaneous Lovenox for DVT prophylaxis.  Acute blood loss anemia 10.3.  Patient was admitted for a comprehensive rehab program  Hospital Course: MAYAN KLOEPFER was admitted to rehab 06/17/2018 for inpatient therapies to consist of PT, ST and OT at least three hours five days a week. Past admission physiatrist, therapy team and rehab RN have worked together to provide customized collaborative inpatient rehab.  Pertaining to patient multi-factorial spinal stenosis resulting in conus syndrome status post L1-2 discectomy left hemilaminotomy 06/15/2018.  Surgical site healing nicely patient would follow-up with Dr. Rolena Infante.  Lumbar corset when out of bed.  Subcutaneous Lovenox for DVT prophylaxis venous Doppler studies negative.  Pain management the use of scheduled Neurontin as well as Robaxin and oxycodone as needed.  Acute blood loss anemia hemoglobin 9.6 no overt signs of blood loss.  Blood pressure monitored and would resume Norvasc.  Constipation versus neurogenic bowel bowel program established no nausea vomiting.  Physical exam.  Blood pressure 149/78 pulse 115 temperature 98.4 respirations 20 oxygen saturations 95% room air Constitutional.  Alert and oriented well-developed HEENT Head.  Normocephalic and atraumatic Eyes.  Pupils round and reactive to light without discharge no nystagmus Neck.  Supple nontender no JVD without thyromegaly Cardiovascular normal rate and rhythm exhibits no friction rub or murmur heard Respiratory effort normal no respiratory distress without wheezes or rails GI.  Soft bowel sounds normal no distention nontender without rebound Musculoskeletal no edema Neurological.  Alert and oriented follows full commands upper extremities 5 out of 5 right lower extremity hip flexors 2-, knee extension 1+, ankle dorsiflexion 3 ankle plantar flexion 4 out of 5 knee  flexion 4 out of 5 hip extension 4 out of 5 left lower extremity 4- to 4 out of 5  Rehab course: During patient's stay in rehab weekly team conferences were held to monitor patient's progress, set goals and discuss barriers to discharge. At admission, patient required minimal assist to ambulate 36 feet rolling walker.  Moderate assist sit to stand.  Simple set up for upper body dressing max assist lower body dressing set up for upper body bathing max assist lower body bathing  He  has had improvement in activity tolerance, balance, postural control as well as ability to compensate for deficits. He has had improvement in functional use RUE/LUE  and  RLE/LLE as well as improvement in awareness.  Working with energy conservation techniques.  Completes bed mobility without rails and supervision.  Dons corset with simple set up.  Negotiate stairs with contact-guard.  Ambulates 150 feet rolling walker noted AFO brace.  Can gather belongings for activities daily living and homemaking.  It was advised no driving.  Family teaching completed plan discharge to home       Disposition: Discharge to home   Diet: Regular  Special Instructions: No driving smoking or alcohol Back corset when out of bed  Medications at discharge. 1.  Tylenol as needed 2.  Ferrous sulfate 325 mg p.o. twice daily 3.  Neurontin 300 mg p.o. 3 times daily 4.  Linzess 145 mcg p.o. daily 5.  Robaxin 500 mg every 6 hours as needed muscle spasms 6.  Multivitamin daily 7.  Oxycodone 10 mg every 3 hours as needed pain 8.  MiraLAX daily hold for loose stools 9.  Senokot S2 tablets at bedtime 10.  Norvasc 5 mg p.o. daily 11.  Flomax 0.4 mg p.o. daily 12.  Zocor 20 mg p.o. daily 13. Norvasc 5 mg daily   Discharge Instructions    Ambulatory referral to Physical Medicine Rehab   Complete by:  As directed    Moderate complexity follow-up 1 to 2 weeks myelopathy with conus syndrome      Follow-up Information    Meredith Staggers, MD Follow up.   Specialty:  Physical Medicine and Rehabilitation Why:  As directed Contact information: [address] [place] [address] [phone]        Melina Schools, MD Follow up.   Specialty:  Orthopedic Surgery Why:  Call for appointment Contact information: [address] [place] [address] [phone]           Signed: Cathlyn Parsons 06/25/2018, 6:03 AM

## 2018-06-25 NOTE — Progress Notes (Signed)
Nutrition Follow-up  DOCUMENTATION CODES:   Not applicable  INTERVENTION:   - Ensure Enlive po BID, each supplement provides 350 kcal and 20 grams of protein  - MVI with minerals daily  NUTRITION DIAGNOSIS:   Increased nutrient needs related to other (therapies) as evidenced by estimated needs.  Ongoing  GOAL:   Patient will meet greater than or equal to 90% of their needs  Progressing  MONITOR:   PO intake, Supplement acceptance, Labs, Weight trends  REASON FOR ASSESSMENT:   Malnutrition Screening Tool    ASSESSMENT:   78 year old male with PMH of HTN, HLD. Presented 06/14/18 with increasing low back pain radiating to the lower extremities for several weeks after lifting a bag of fertilizer. 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 Pt underwent bilateral L1-2 discectomy with left hemilaminotomy and lateral recess decompression L2-3 06/15/18. Pt admitted to CIR on 5/18.  Spoke with pt at bedside. Pt eating lunch, had completed ~90% by end of RD visit. Pt reports no issues with appetite and states that it has improved greatly since admission. Pt endorses drinking Ensure Enlive.  Weight up 3 lbs total since admission.  Noted plan for pt to discharge tomorrow, 5/27.  Meal Completion: 85-100% x last 8 meals  Medications reviewed and include: Ensure Enlive BID, ferrous sulfate, MVI with minerals daily, Senna  Labs reviewed: sodium 133 (L), chloride 95 (L), elevated LFTs, hemoglobin 9.6 (L)  Diet Order:   Diet Order            Diet regular Room service appropriate? Yes; Fluid consistency: Thin  Diet effective now              EDUCATION NEEDS:   No education needs have been identified at this time  Skin:  Skin Assessment: Skin Integrity Issues: Incisions: surgical incision to back  Last BM:  no documented BM  Height:   Ht Readings from Last 1 Encounters:   06/17/18 5\' 11"  (1.803 m)    Weight:   Wt Readings from Last 1 Encounters:  06/24/18 87.2 kg    Ideal Body Weight:  78.2 kg  BMI:  Body mass index is 26.81 kg/m.  Estimated Nutritional Needs:   Kcal:  1950-2150  Protein:  100-115 grams  Fluid:  >/= 1.9 L    Gaynell Face, MS, RD, LDN Inpatient Clinical Dietitian Pager: [phone] Weekend/After Hours: [phone]

## 2018-06-26 DIAGNOSIS — G959 Disease of spinal cord, unspecified: Secondary | ICD-10-CM | POA: Diagnosis not present

## 2018-06-26 LAB — CBC
HCT: 28.9 % — ABNORMAL LOW (ref 39.0–52.0)
Hemoglobin: 9.3 g/dL — ABNORMAL LOW (ref 13.0–17.0)
MCH: 28.4 pg (ref 26.0–34.0)
MCHC: 32.2 g/dL (ref 30.0–36.0)
MCV: 88.4 fL (ref 80.0–100.0)
Platelets: 245 10*3/uL (ref 150–400)
RBC: 3.27 MIL/uL — ABNORMAL LOW (ref 4.22–5.81)
RDW: 14.2 % (ref 11.5–15.5)
WBC: 5 10*3/uL (ref 4.0–10.5)
nRBC: 0 % (ref 0.0–0.2)

## 2018-06-26 NOTE — Progress Notes (Signed)
Social Work  Discharge Note  The overall goal for the admission was met for:   Discharge location: Yes - home with wife who can provide 24/7 assistance  Length of Stay: Yes - 9 days  Discharge activity level: Yes - supervision overall  Home/community participation: Yes  Services provided included: MD, RD, PT, OT, RN, TR, Pharmacy and [place]: [employer]  Follow-up services arranged: Home Health: PT, OT via [facility], DME: rolling walker, tub bench via [facility] and Patient/Family has no preference for HH/DME agencies  Comments (or additional information):          Contact info:  Pt @ [phone]     Marcellina Millin @ [phone]  Patient/Family verbalized understanding of follow-up arrangements: Yes  Individual responsible for coordination of the follow-up plan: pt  Confirmed correct DME delivered: Huckleberry Martinson 06/26/2018    Kawika Bischoff

## 2018-06-26 NOTE — Discharge Instructions (Signed)
[facility] Discharge Instructions  Joseph Moyer Discharge date and time: No discharge date for patient encounter.   Activities/Precautions/ Functional Status: Activity: Back corset when out of bed Diet: regular diet Wound Care: keep wound clean and dry Functional status:  ___ No restrictions     ___ Walk up steps independently ___ 24/7 supervision/assistance   ___ Walk up steps with assistance ___ Intermittent supervision/assistance  ___ Bathe/dress independently ___ Walk with walker     _x__ Bathe/dress with assistance ___ Walk Independently    ___ Shower independently ___ Walk with assistance    ___ Shower with assistance ___ No alcohol     ___ Return to work/school ________    COMMUNITY REFERRALS UPON DISCHARGE:    Home Health:   PT     OT                      Agency:  [facility]    Phone: [phone]   Medical Equipment/Items Ordered:  Rolling walker, tub bench                                                      Agency/Supplier:  [facility] @ [phone]     Special Instructions:  No driving smoking or alcohol  My questions have been answered and I understand these instructions. I will adhere to these goals and the provided educational materials after my discharge from the hospital.  Patient/Caregiver Signature _______________________________ Date __________  Clinician Signature _______________________________________ Date __________  Please bring this form and your medication list with you to all your follow-up doctor's appointments.

## 2018-06-26 NOTE — Progress Notes (Signed)
Physical Therapy Session Note  Patient Details  Name: Joseph Moyer MRN: [id number] Date of Birth: 21-Mar-1940  Today's Date: 06/26/2018 PT Individual Time: 8341-9622 PT Individual Time Calculation (min): 24 min   Short Term Goals: Week 1:  PT Short Term Goal 1 (Week 1): STG = LTG due to short ELOS.  Skilled Therapeutic Interventions/Progress Updates:  Pt received in w/c performing bathing tasks sitting<>standing at w/c level with mod I. Donning scrub pants, top & brace mod I. Pt reports 2/10 back pain but declines pain medication at this time. Pt received R foot up brace this morning but has not trialed it yet. Pt dons socks with sock aide & mod I, shoes with assistance with pt reporting his wife can help him with task at d/c. Reviewed how to don/doff R foot-up brace. Pt ambulates around unit with RW & supervision (150 ft) with improving dorsiflexion during RLE swing phase and slightly improved heel strike RLE. Pt voices comfort with use of R foot-up brace. Adjusted height of pt's personal RW that was delivered for optimal positioning. Pt left in w/c with chair alarm donned & all needs at hand.   Therapy Documentation Precautions:  Precautions Precautions: Fall, Back Precaution Booklet Issued: Yes (comment) Required Braces or Orthoses: Spinal Brace Spinal Brace: Lumbar corset, Applied in sitting position Restrictions Weight Bearing Restrictions: No   Therapy/Group: Individual Therapy  Waunita Schooner 06/26/2018, 9:00 AM

## 2018-06-26 NOTE — Progress Notes (Addendum)
[place] PHYSICAL MEDICINE & REHABILITATION PROGRESS NOTE   Subjective/Complaints: No new issues this morning. Happy to be going home. Bowels moving. Had questions about incision  ROS: Patient denies fever, rash, sore throat, blurred vision, nausea, vomiting, diarrhea, cough, shortness of breath or chest pain, joint or back pain, headache, or mood change.      Objective:   No results found. Recent Labs    06/26/18 0532  WBC 5.0  HGB 9.3*  HCT 28.9*  PLT 245   No results for input(s): NA, K, CL, CO2, GLUCOSE, BUN, CREATININE, CALCIUM in the last 72 hours.  Intake/Output Summary (Last 24 hours) at 06/26/2018 0857 Last data filed at 06/26/2018 0047 Gross per 24 hour  Intake 460 ml  Output 300 ml  Net 160 ml     Physical Exam: Vital Signs Blood pressure 126/74, pulse 88, temperature 98.2 F (36.8 C), temperature source Oral, resp. rate 16, height 5\' 11"  (1.803 m), weight 86.2 kg, SpO2 98 %. Constitutional: No distress . Vital signs reviewed. HEENT: EOMI, oral membranes moist Neck: supple Cardiovascular: RRR without murmur. No JVD    Respiratory: CTA Bilaterally without wheezes or rales. Normal effort    GI: BS +, non-tender, non-distended  Musculoskeletal:  General: No edema.  Comments: LB TTP, in LSO. Neurological: He is alertand oriented to person, place, and time. Patient is alert in no acute distress. Follows full commands. UE 5/5. RLE: HF 2+, KE 1+ to 2- at best , ADF 3- to 3/5, APF 4/5, KF 4/5, HE 4/5===really not a lot of change yet. LLE:   4/5.---some improvement in motor exam.  Decreased sensation over primarily over right L2, L3 dermatomes still present.  DTR's absent in LE's.  Skin: Skin iswarm. He isnot diaphoretic. There is no rash, there are scratch marks, skin is a little dry Back incision remains CDI  Psychiatric: pleasant, a little anxious   Assessment/Plan: 1. Functional deficits secondary to lumbar stenosis/HNP with conus syndrome  which require 3+ hours per day of interdisciplinary therapy in a comprehensive inpatient rehab setting.  Physiatrist is providing close team supervision and 24 hour management of active medical problems listed below.  Physiatrist and rehab team continue to assess barriers to discharge/monitor patient progress toward functional and medical goals  Care Tool:  Bathing  Bathing activity did not occur: Refused Body parts bathed by patient: Right arm, Left arm, Chest, Abdomen, Front perineal area, Face, Right upper leg, Left upper leg, Buttocks, Left lower leg, Right lower leg   Body parts bathed by helper: Right lower leg, Left lower leg Body parts n/a: Right lower leg   Bathing assist Assist Level: Independent with assistive device     Upper Body Dressing/Undressing Upper body dressing   What is the patient wearing?: Pull over shirt, Orthosis    Upper body assist Assist Level: Independent with assistive device    Lower Body Dressing/Undressing Lower body dressing      What is the patient wearing?: Pants     Lower body assist Assist for lower body dressing: Independent with assitive device     Toileting Toileting Toileting Activity did not occur (Clothing management and hygiene only): Safety/medical concerns  Toileting assist Assist for toileting: Supervision/Verbal cueing     Transfers Chair/bed transfer  Transfers assist  Chair/bed transfer activity did not occur: Safety/medical concerns  Chair/bed transfer assist level: Supervision/Verbal cueing     Locomotion Ambulation   Ambulation assist   Ambulation activity did not occur: Safety/medical concerns  Assist  level: Supervision/Verbal cueing Assistive device: Walker-rolling Max distance: 150 ft    Walk 10 feet activity   Assist  Walk 10 feet activity did not occur: Safety/medical concerns  Assist level: Supervision/Verbal cueing Assistive device: Walker-rolling   Walk 50 feet activity   Assist  Walk 50 feet with 2 turns activity did not occur: Safety/medical concerns  Assist level: Supervision/Verbal cueing Assistive device: Walker-rolling    Walk 150 feet activity   Assist Walk 150 feet activity did not occur: Safety/medical concerns  Assist level: Supervision/Verbal cueing Assistive device: Walker-rolling    Walk 10 feet on uneven surface  activity   Assist Walk 10 feet on uneven surfaces activity did not occur: Safety/medical concerns   Assist level: Supervision/Verbal cueing Assistive device: Aeronautical engineer Will patient use wheelchair at discharge?: No Type of Wheelchair: Manual    Wheelchair assist level: Independent Max wheelchair distance: 150'    Wheelchair 50 feet with 2 turns activity    Assist        Assist Level: Independent   Wheelchair 150 feet activity     Assist     Assist Level: Independent    Medical Problem List and Plan: 1.Decreased functional mobilitysecondary to severemultifactorialspinal stenosis 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 Status post L1-2 bilateral discectomy with left hemilaminotomy 06/15/2018. Lumbar corset when out of bed -dc home  -AFO RLE today, tweak as outpt  -Patient to see Rehab MD/provider in the office for transitional care encounter in 2-4 weeks.   -has been ambulating with RW and without an orthotic. Uses a lot of upper body. Still suspect he will need RLE orthotic at some point 2. Antithrombotics: -DVT/anticoagulation:Subcutaneous Lovenox. dopplers negative -antiplatelet therapy: N/A 3. Pain Management:Neurontin 300 mg 3 times daily, Robaxin and oxycodone as needed 4. Mood:Provide emotional support -antipsychotic agents: N/A 5. Neuropsych: This patientiscapable of making decisions on hisown behalf. 6. Skin/Wound Care:Routine skin checks, dressing removed. Can get wound  wet 7. Fluids/Electrolytes/Nutrition:encourage PO    8. Acute blood loss anemia. hgb holding at 9.3  -no overt signs of blood loss   -maintain FE supp 9.Hypertension. Patient on Norvasc 5 mg daily prior to admission. Resume as needed 10. BPH. Resumed Flomax 0.4 mg daily. -pt states he's been voiding without significant difficulty 11. Hyperlipidemia. Resume Zocor 12. Constipation vs Neurogenic bowel:     - -linzess ---continue at home, dc senna   Moving bowels 13.  Pruritus secondary to oxycodone, sarna, benadryl   -better with change to hydrocodone LOS: 9 days A FACE TO FACE EVALUATION WAS PERFORMED  Meredith Staggers 06/26/2018, 8:57 AM

## 2018-06-26 NOTE — Patient Care Conference (Signed)
Inpatient RehabilitationTeam Conference and Plan of Care Update Date: 06/25/2018   Time: 2:40 PM    Patient Name: Joseph Moyer      Medical Record Number: [id number]  Date of Birth: 1940-05-13 Sex: Male         Room/Bed: 4W17C/4W17C-01 Payor Info: Payor: [employer] MEDICARE / Plan: [employer] MEDICARE HMO/PPO / Product Type: *No Product type* /    Admitting Diagnosis: TBI Team  Other Neuro, Lumbar spinal stenosis neurogenic claudication  13-14 days  Admit Date/Time:  06/17/2018  4:14 PM Admission Comments: No comment available   Primary Diagnosis:  <principal problem not specified> Principal Problem: <principal problem not specified>  Patient Active Problem List   Diagnosis Date Noted  . 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 S/P lumbar fusion 06/15/2018  . 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 Overweight (BMI 25.0-29.9) 07/31/2014  . Acute diverticulitis 07/30/2014  . Elevated BP 07/30/2014  . Hyponatremia 07/30/2014  . Hyperlipidemia 07/30/2014  . HTN (hypertension) 07/30/2014    Expected Discharge Date: Expected Discharge Date: 06/26/18  Team Members Present: Physician leading conference: Dr. Alger Simons Social Worker Present: Lennart Pall, LCSW Nurse Present: Rayetta Pigg, RN PT Present: Lavone Nian, PT OT Present: Willeen Cass, OT SLP Present: Weston Anna, SLP PPS Coordinator present : Ileana Ladd, PT     Current Status/Progress Goal Weekly Team Focus  Medical   constipated, pruritus from ?oxycodone, AFO to stabilize RLE  pain mgt, improve bowel control  pain mgt, constipation   Bowel/Bladder   Continent of bowel and bladder  Remain continent of B/B  assess and assist with toileting needs qshift and PRN   Swallow/Nutrition/ Hydration             ADL's   S-MOD I with functional mobility and ADLs  S-MOD I   discharge planning/family edu   Mobility   supervision overall with RW, except min assist stairs with 1 rail  supervision overall with  LRAD  transfers, bed mobility, gait, stairs, strengthening, NMR, balance, d/c planning, pt education, AFO consult   Communication             Safety/Cognition/ Behavioral Observations            Pain   Surgical site to mid back c/o pain 5-7/10 reileived with oxycodone Q3 PRN and Robaxin q6 PRN  Pain level of 3 or less on pain scale of 0 to 10  assess pain qshift and PRN   Skin   Honey comb dressing to surgical site fell off. steri-strips dry gauze and tegaderm in place  Keep site clean dry and free of infection. Promote proper wound healing  assess skin qshift and PRN    Rehab Goals Patient on target to meet rehab goals: Yes *See Care Plan and progress notes for long and short-term goals.     Barriers to Discharge  Current Status/Progress Possible Resolutions Date Resolved   Physician    Medical stability        stable for discharge      Nursing                  PT  Home environment access/layout  5 steps + 5 steps with 1 rail to access bed/bath on 2nd level              OT                  SLP  SW                Discharge Planning/Teaching Needs:  Pt to d/c home with wife who can provide 24/7 assistance.  Teaching completed by therapies via phone.   Team Discussion:  Medically stable for d/c tomorrow.  Should be getting AFO today.  All goals met.  Revisions to Treatment Plan:  NA    Continued Need for Acute Rehabilitation Level of Care: The patient requires daily medical management by a physician with specialized training in physical medicine and rehabilitation for the following conditions: Daily direction of a multidisciplinary physical rehabilitation program to ensure safe treatment while eliciting the highest outcome that is of practical value to the patient.: Yes Daily medical management of patient stability for increased activity during participation in an intensive rehabilitation regime.: Yes Daily analysis of laboratory values and/or radiology  reports with any subsequent need for medication adjustment of medical intervention for : Post surgical problems;Neurological problems   I attest that I was present, lead the team conference, and concur with the assessment and plan of the team.   Lennart Pall 06/26/2018, 11:33 AM    Team conference was held via web/ teleconference due to [place] - 19

## 2018-06-27 ENCOUNTER — Telehealth: Payer: Self-pay | Admitting: *Deleted

## 2018-06-27 DIAGNOSIS — M5106 Intervertebral disc disorders with myelopathy, lumbar region: Secondary | ICD-10-CM | POA: Diagnosis not present

## 2018-06-27 DIAGNOSIS — M48061 Spinal stenosis, lumbar region without neurogenic claudication: Secondary | ICD-10-CM | POA: Diagnosis not present

## 2018-06-27 DIAGNOSIS — Z4781 Encounter for orthopedic aftercare following surgical amputation: Secondary | ICD-10-CM | POA: Diagnosis not present

## 2018-06-27 DIAGNOSIS — M1990 Unspecified osteoarthritis, unspecified site: Secondary | ICD-10-CM | POA: Diagnosis not present

## 2018-06-27 DIAGNOSIS — Z87891 Personal history of nicotine dependence: Secondary | ICD-10-CM | POA: Diagnosis not present

## 2018-06-27 DIAGNOSIS — Z981 Arthrodesis status: Secondary | ICD-10-CM | POA: Diagnosis not present

## 2018-06-27 DIAGNOSIS — E785 Hyperlipidemia, unspecified: Secondary | ICD-10-CM | POA: Diagnosis not present

## 2018-06-27 DIAGNOSIS — Z7982 Long term (current) use of aspirin: Secondary | ICD-10-CM | POA: Diagnosis not present

## 2018-06-27 DIAGNOSIS — I10 Essential (primary) hypertension: Secondary | ICD-10-CM | POA: Diagnosis not present

## 2018-06-27 NOTE — Telephone Encounter (Signed)
Transitional Care call-I spoke with his wife.    1. Are you/is patient experiencing any problems since coming home? Are there any questions regarding any aspect of care? NO except he has rash on back. (see question about skin) 2. Are there any questions regarding medications administration/dosing? Are meds being taken as prescribed? Patient should review meds with caller to confirm NO, he has medications. 3. Have there been any falls? NO 4. Has Home Health been to the house and/or have they contacted you? If not, have you tried to contact them? Can we help you contact them? YES 5. Are bowels and bladder emptying properly? Are there any unexpected incontinence issues? If applicable, is patient following bowel/bladder programs? SOME CONSTIPATION. WE DISCUSSED MEDICATIONS ON HIS LIST TO COUNTER ACT THIS PROBLEM 6. Any fevers, problems with breathing, unexpected pain? NO 7. Are there any skin problems or new areas of breakdown? HE WAS HAVING ITCHING IN HOSPITAL AND WAS GIVEN SOME CREAMS TO USE. THEY HAVE NOTICED THAT HE HAS DARK SPOTS ON HIS BACK NOW AND DRY SPOTS. HIS WIFE IS REQUESTING ADVICE IN DEALING WITH THIS. I DIRECTED HER TO FOLLOW UP WITH PCP BUT SHE SAYS THEY TALKED TO HIM YESTERDAY AND WILL NOT BE FOLLOWING UP WITH HIM ABOUT THIS. SHE WOULD LIKE ADVICE FROM DR ERXVQM. 8. Has the patient/family member arranged specialty MD follow up (ie cardiology/neurology/renal/surgical/etc)?  Can we help arrange? I HAVE GIVEN HER THE FOLLOW UP APPOINTMENT IN OUR OFFICE. SHE WOULD LIKE IT TO BE A WEB-EX APPOINTMENT. 9. Does the patient need any other services or support that we can help arrange? NO 10. Are caregivers following through as expected in assisting the patient? YES 11. Has the patient quit smoking, drinking alcohol, or using drugs as recommended?N/A  Appointment 07/09/18 @ 2:40 [place] NP AND WILL BE CONVERTED TO WEB-EX OR PHONE VISIT ALERTED TO WATCH MAIL FOR PACKET FROM OUR  OFFICE [place]

## 2018-06-27 NOTE — Telephone Encounter (Signed)
Jim PT called for POC 1wk1, 2wk1, 1wk1.  Approval given.  He reports that Mr Ager has had a rash for 2 days and was itching in the hospital.

## 2018-06-28 ENCOUNTER — Telehealth: Payer: Self-pay | Admitting: *Deleted

## 2018-06-28 ENCOUNTER — Telehealth: Payer: Self-pay

## 2018-06-28 DIAGNOSIS — M5106 Intervertebral disc disorders with myelopathy, lumbar region: Secondary | ICD-10-CM | POA: Diagnosis not present

## 2018-06-28 DIAGNOSIS — I10 Essential (primary) hypertension: Secondary | ICD-10-CM | POA: Diagnosis not present

## 2018-06-28 DIAGNOSIS — Z87891 Personal history of nicotine dependence: Secondary | ICD-10-CM | POA: Diagnosis not present

## 2018-06-28 DIAGNOSIS — Z7982 Long term (current) use of aspirin: Secondary | ICD-10-CM | POA: Diagnosis not present

## 2018-06-28 DIAGNOSIS — Z4781 Encounter for orthopedic aftercare following surgical amputation: Secondary | ICD-10-CM | POA: Diagnosis not present

## 2018-06-28 DIAGNOSIS — M1990 Unspecified osteoarthritis, unspecified site: Secondary | ICD-10-CM | POA: Diagnosis not present

## 2018-06-28 DIAGNOSIS — Z981 Arthrodesis status: Secondary | ICD-10-CM | POA: Diagnosis not present

## 2018-06-28 DIAGNOSIS — M48061 Spinal stenosis, lumbar region without neurogenic claudication: Secondary | ICD-10-CM | POA: Diagnosis not present

## 2018-06-28 DIAGNOSIS — E785 Hyperlipidemia, unspecified: Secondary | ICD-10-CM | POA: Diagnosis not present

## 2018-06-28 MED ORDER — HYDROCORTISONE 2.5 % EX CREA: TOPICAL_CREAM | Freq: Two times a day (BID) | CUTANEOUS | 0 refills | Status: DC

## 2018-06-28 NOTE — Telephone Encounter (Signed)
Notified. 

## 2018-06-28 NOTE — Telephone Encounter (Signed)
I was under the perception that the change to hydrocodone helped the itching and rash. You mention rash now but not itching. If that is so, the dark spots are likely a manifestation of his scratching the pruritic areas and would be expected as they evolve and disappear.   Nevertheless, i've sent an rx for 2.5% hydrocortisone cream. He may also use OTC benadryl tabs and sarna lotion  Also, he may need to stop taking hydrocodone as this could be contributing as well.

## 2018-06-28 NOTE — Telephone Encounter (Signed)
Joseph Moyer, PT from [facility] called stating that patient has Level 1 medication interaction between Amlodipine and Simvastatin. Is patient to continue on both medication or does medication need to be adjusted?

## 2018-06-28 NOTE — Discharge Summary (Signed)
Patient ID: Joseph Moyer MRN: [id number] DOB/AGE: January 02, 1941 77 y.o.  Admit date: 06/14/2018 Discharge date: 06/28/2018  Admission Diagnoses:  Active Problems:   Herniated lumbar intervertebral disc   S/P lumbar fusion   Discharge Diagnoses:  Active Problems:   Herniated lumbar intervertebral disc   S/P lumbar fusion  status post Procedure(s): POSTERIOR LUMBAR FUSION 1 LEVEL LUMBAR 1 - LUMBAR 2 Gill decompression L1-L2/ complete inferior facetectomyof L1. Posterior lateral arthrodesiswith autograft bone L1-L2.  Past Medical History:  Diagnosis Date   Allergy    Arthritis    Hyperlipidemia    Hypertension     Surgeries: Procedure(s): POSTERIOR LUMBAR FUSION 1 LEVEL LUMBAR 1 - LUMBAR 2 Gill decompression L1-L2/ complete inferior facetectomyof L1. Posterior lateral arthrodesiswith autograft bone L1-L2. on 06/15/2018   Consultants: NOne  Discharged Condition: Improved  Hospital Course: Joseph Moyer is an 78 y.o. male who was admitted 06/14/2018 for operative treatment of herniated lumber disc with radicular leg pain. Patient failed conservative treatments (please see the history and physical for the specifics) and had severe unremitting pain that affects sleep, daily activities and work/hobbies. After pre-op clearance, the patient was taken to the operating room on 06/15/2018 and underwent  Procedure(s): POSTERIOR LUMBAR FUSION 1 LEVEL LUMBAR 1 - LUMBAR 2 Gill decompression L1-L2/ complete inferior facetectomyof L1. Posterior lateral arthrodesiswith autograft bone L1-L2.Marland Kitchen    Patient was given perioperative antibiotics:  Anti-infectives (From admission, onward)   Start     Dose/Rate Route Frequency Ordered Stop   06/15/18 2000  ceFAZolin (ANCEF) IVPB 1 g/50 mL premix     1 g 100 mL/hr over 30 Minutes Intravenous Every 8 hours 06/15/18 1630 06/16/18 0418   06/15/18 1445  ceFAZolin (ANCEF) IVPB 1 g/50 mL premix  Status:  Discontinued     1 g 100 mL/hr over 30  Minutes Intravenous Every 8 hours 06/15/18 1427 06/15/18 1630   06/15/18 0732  ceFAZolin (ANCEF) 2-4 GM/100ML-% IVPB    Note to Pharmacy:  Cleda Daub   : cabinet override      06/15/18 0732 06/15/18 1944       Patient was given sequential compression devices and early ambulation to prevent DVT.   Patient benefited maximally from hospital stay and there were no complications. At the time of discharge, the patient was urinating/moving their bowels without difficulty, tolerating a regular diet, pain is controlled with oral pain medications and they have been cleared by PT/OT.   Recent vital signs: No data found.   Recent laboratory studies:  Recent Labs    06/26/18 0532  WBC 5.0  HGB 9.3*  HCT 28.9*  PLT 245     Discharge Medications:   Allergies as of 06/17/2018   No Known Allergies     Medication List    ASK your doctor about these medications   amLODipine 5 MG tablet Commonly known as:  NORVASC Take 5 mg by mouth daily.   aspirin 81 MG tablet Take 81 mg by mouth every morning.   simvastatin 20 MG tablet Commonly known as:  ZOCOR Take 20 mg by mouth every evening.   tamsulosin 0.4 MG Caps capsule Commonly known as:  FLOMAX Take 0.4 mg by mouth daily.       Diagnostic Studies: X-ray Chest Pa And Lateral  Result Date: 06/14/2018 CLINICAL DATA:  Preoperative examination. EXAM: CHEST - 2 VIEW COMPARISON:  Chest x-ray dated July 30, 2014. FINDINGS: The heart size and mediastinal contours are within normal limits. Both lungs are  clear. The visualized skeletal structures are unremarkable. IMPRESSION: No active cardiopulmonary disease. Electronically Signed   By: Titus Dubin M.D.   On: 06/14/2018 14:41   Dg Lumbar Spine 2-3 Views  Result Date: 06/15/2018 CLINICAL DATA:  L1-L2 posterior decompression EXAM: DG C-ARM 61-120 MIN; LUMBAR SPINE - 2-3 VIEW FLUOROSCOPY TIME:  2 minutes, 13 seconds COMPARISON:  Lumbar spine MRI-06/14/2018 FINDINGS: Four spot cone  fluoroscopic images of the lumbar spine are provided for review including dedicated lateral projection of the lumbosacral junction. Provided images demonstrate sequela of L1-L2 paraspinal fusion. Alignment appears anatomic. Expected subcutaneous emphysema about the operative site. No radiopaque foreign body. IMPRESSION: Post L1-L2 paraspinal fusion without evidence of complication. Electronically Signed   By: Sandi Mariscal M.D.   On: 06/15/2018 14:54   Mr Lumbar Spine W Wo Contrast  Result Date: 06/14/2018 CLINICAL DATA:  Acute low back pain with bilateral leg pain and weakness since lifting a heavy bag in the yard 2 weeks ago. EXAM: MRI LUMBAR SPINE WITHOUT AND WITH CONTRAST TECHNIQUE: Multiplanar and multiecho pulse sequences of the lumbar spine were obtained without and with intravenous contrast. CONTRAST:  9 mL Gadavist intravenous contrast. COMPARISON:  MR lumbar spine dated July 24, 2014. FINDINGS: Segmentation:  Standard. Alignment:  Unchanged trace retrolisthesis at L5-S1. Vertebrae: No fracture, evidence of discitis, or bone lesion. Unchanged chronic degenerative fatty marrow endplate changes at Z6-X0 and L5-S1. Conus medullaris and cauda equina: Conus extends to the L1-L2 level. No intradural enhancement. Paraspinal and other soft tissues: Negative. Disc levels: T11-T12: Negative. T12-L1: Negative disc. Mild bilateral facet arthropathy. No stenosis. L1-L2: New large central and left-sided disc extrusion migrating both superiorly and inferiorly, measuring 2.6 cm in craniocaudal dimension. This results in new severe spinal canal stenosis with posterior displacement of the conus. A small amount of disc material extends into the left neural foramen (series 6, image 9). Unchanged mild bilateral facet arthropathy. L2-L3: Central and left paracentral disc protrusion. Moderate to severe bilateral facet arthropathy. Progressive moderate to severe central spinal canal and lateral recess stenosis. Unchanged mild  bilateral neuroforaminal stenosis. L3-L4: Unchanged small circumferential disc osteophyte complex. Unchanged moderate bilateral facet arthropathy. Unchanged borderline mild spinal canal stenosis. Unchanged mild bilateral neuroforaminal stenosis. L4-L5: Unchanged small circumferential disc osteophyte complex and mild bilateral facet arthropathy. Unchanged mild left lateral recess stenosis. Unchanged moderate bilateral neuroforaminal stenosis. No spinal canal stenosis. L5-S1: Unchanged circumferential disc osteophyte complex and mild bilateral facet arthropathy. Unchanged moderate to severe left and moderate right neuroforaminal stenosis. No spinal canal stenosis. IMPRESSION: 1. 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 2. Progressive moderate to severe spinal canal and lateral recess stenosis at L2-L3. 3. Remaining multilevel lumbar spondylosis as described above is similar to prior study, including moderate to severe left neuroforaminal stenosis at L5-S1. Electronically Signed   By: Titus Dubin M.D.   On: 06/14/2018 16:54   Dg C-arm 1-60 Min  Result Date: 06/15/2018 CLINICAL DATA:  L1-L2 posterior decompression EXAM: DG C-ARM 61-120 MIN; LUMBAR SPINE - 2-3 VIEW FLUOROSCOPY TIME:  2 minutes, 13 seconds COMPARISON:  Lumbar spine MRI-06/14/2018 FINDINGS: Four spot cone fluoroscopic images of the lumbar spine are provided for review including dedicated lateral projection of the lumbosacral junction. Provided images demonstrate sequela of L1-L2 paraspinal fusion. Alignment appears anatomic. Expected subcutaneous emphysema about the operative site. No radiopaque foreign body. IMPRESSION: Post L1-L2 paraspinal fusion without evidence of  complication. Electronically Signed   By: Sandi Mariscal M.D.   On: 06/15/2018 14:54   Vas [place] Lower Extremity Venous (dvt)  Result Date:  06/18/2018  Lower Venous Study Indications: Swelling.  Performing Technologist: Oliver Hum RVT  Examination Guidelines: A complete evaluation includes B-mode imaging, spectral Doppler, color Doppler, and power Doppler as needed of all accessible portions of each vessel. Bilateral testing is considered an integral part of a complete examination. Limited examinations for reoccurring indications may be performed as noted.  +---------+---------------+---------+-----------+----------+-------+  RIGHT     Compressibility Phasicity Spontaneity Properties Summary  +---------+---------------+---------+-----------+----------+-------+  CFV       Full            Yes       Yes                             +---------+---------------+---------+-----------+----------+-------+  SFJ       Full                                                      +---------+---------------+---------+-----------+----------+-------+  FV Prox   Full                                                      +---------+---------------+---------+-----------+----------+-------+  FV Mid    Full                                                      +---------+---------------+---------+-----------+----------+-------+  FV Distal Full                                                      +---------+---------------+---------+-----------+----------+-------+  PFV       Full                                                      +---------+---------------+---------+-----------+----------+-------+  POP       Full            Yes       Yes                             +---------+---------------+---------+-----------+----------+-------+  PTV       Full                                                      +---------+---------------+---------+-----------+----------+-------+  PERO      Full                                                      +---------+---------------+---------+-----------+----------+-------+   +---------+---------------+---------+-----------+----------+-------+  LEFT      Compressibility Phasicity Spontaneity Properties Summary  +---------+---------------+---------+-----------+----------+-------+  CFV       Full            Yes       Yes                             +---------+---------------+---------+-----------+----------+-------+  SFJ       Full                                                      +---------+---------------+---------+-----------+----------+-------+  FV Prox   Full                                                      +---------+---------------+---------+-----------+----------+-------+  FV Mid    Full                                                      +---------+---------------+---------+-----------+----------+-------+  FV Distal Full                                                      +---------+---------------+---------+-----------+----------+-------+  PFV       Full                                                      +---------+---------------+---------+-----------+----------+-------+  POP       Full            Yes       Yes                             +---------+---------------+---------+-----------+----------+-------+  PTV       Full                                                      +---------+---------------+---------+-----------+----------+-------+  PERO      Full                                                      +---------+---------------+---------+-----------+----------+-------+     Summary: Right: There is no evidence of deep vein thrombosis in the lower extremity. No cystic structure found in the popliteal fossa. Left: There is no evidence of deep vein thrombosis in the lower extremity. No cystic structure found  in the popliteal fossa.  *See table(s) above for measurements and observations. Electronically signed by Harold Barban MD on 06/18/2018 at 2:50:18 PM.    Final     Discharge Instructions    Incentive spirometry RT   Complete by:  As directed         Discharge Plan:  discharge to SNF  Disposition: stable.      Signed: Yvonne Kendall Cevin Rubinstein for [facility] PA-C [facility] [phone] 06/28/2018, 8:36 AM

## 2018-06-28 NOTE — Telephone Encounter (Signed)
This provider placed a call to [facility] and spoke with pharmacist. Placed a call to Mr. Dominiqueand spoke with him and his wife Narda Rutherford. Mrs. Dexter spoke with Dr. Delfina Redwood his PCP yesterday and she states she was instructed to resume the Simvastatin. Mr. Blowe was on Simvastatin prior to his hospitalization.

## 2018-06-28 NOTE — Telephone Encounter (Signed)
[facility] called for POC 1wk1, 2wk2, 1 wk2.  Approval given.

## 2018-07-01 DIAGNOSIS — M25361 Other instability, right knee: Secondary | ICD-10-CM | POA: Diagnosis not present

## 2018-07-01 DIAGNOSIS — Z87891 Personal history of nicotine dependence: Secondary | ICD-10-CM | POA: Diagnosis not present

## 2018-07-01 DIAGNOSIS — M48061 Spinal stenosis, lumbar region without neurogenic claudication: Secondary | ICD-10-CM | POA: Diagnosis not present

## 2018-07-01 DIAGNOSIS — Z4781 Encounter for orthopedic aftercare following surgical amputation: Secondary | ICD-10-CM | POA: Diagnosis not present

## 2018-07-01 DIAGNOSIS — M5116 Intervertebral disc disorders with radiculopathy, lumbar region: Secondary | ICD-10-CM | POA: Diagnosis not present

## 2018-07-01 DIAGNOSIS — E785 Hyperlipidemia, unspecified: Secondary | ICD-10-CM | POA: Diagnosis not present

## 2018-07-01 DIAGNOSIS — Z7982 Long term (current) use of aspirin: Secondary | ICD-10-CM | POA: Diagnosis not present

## 2018-07-01 DIAGNOSIS — M1990 Unspecified osteoarthritis, unspecified site: Secondary | ICD-10-CM | POA: Diagnosis not present

## 2018-07-01 DIAGNOSIS — M5106 Intervertebral disc disorders with myelopathy, lumbar region: Secondary | ICD-10-CM | POA: Diagnosis not present

## 2018-07-01 DIAGNOSIS — I10 Essential (primary) hypertension: Secondary | ICD-10-CM | POA: Diagnosis not present

## 2018-07-01 DIAGNOSIS — Z4889 Encounter for other specified surgical aftercare: Secondary | ICD-10-CM | POA: Diagnosis not present

## 2018-07-01 DIAGNOSIS — Z981 Arthrodesis status: Secondary | ICD-10-CM | POA: Diagnosis not present

## 2018-07-03 DIAGNOSIS — M1990 Unspecified osteoarthritis, unspecified site: Secondary | ICD-10-CM | POA: Diagnosis not present

## 2018-07-03 DIAGNOSIS — I10 Essential (primary) hypertension: Secondary | ICD-10-CM | POA: Diagnosis not present

## 2018-07-03 DIAGNOSIS — Z7982 Long term (current) use of aspirin: Secondary | ICD-10-CM | POA: Diagnosis not present

## 2018-07-03 DIAGNOSIS — Z981 Arthrodesis status: Secondary | ICD-10-CM | POA: Diagnosis not present

## 2018-07-03 DIAGNOSIS — Z4781 Encounter for orthopedic aftercare following surgical amputation: Secondary | ICD-10-CM | POA: Diagnosis not present

## 2018-07-03 DIAGNOSIS — E785 Hyperlipidemia, unspecified: Secondary | ICD-10-CM | POA: Diagnosis not present

## 2018-07-03 DIAGNOSIS — Z87891 Personal history of nicotine dependence: Secondary | ICD-10-CM | POA: Diagnosis not present

## 2018-07-03 DIAGNOSIS — M48061 Spinal stenosis, lumbar region without neurogenic claudication: Secondary | ICD-10-CM | POA: Diagnosis not present

## 2018-07-03 DIAGNOSIS — M5106 Intervertebral disc disorders with myelopathy, lumbar region: Secondary | ICD-10-CM | POA: Diagnosis not present

## 2018-07-04 DIAGNOSIS — R21 Rash and other nonspecific skin eruption: Secondary | ICD-10-CM | POA: Diagnosis not present

## 2018-07-04 DIAGNOSIS — D649 Anemia, unspecified: Secondary | ICD-10-CM | POA: Diagnosis not present

## 2018-07-04 DIAGNOSIS — M5127 Other intervertebral disc displacement, lumbosacral region: Secondary | ICD-10-CM | POA: Diagnosis not present

## 2018-07-06 DIAGNOSIS — Z87891 Personal history of nicotine dependence: Secondary | ICD-10-CM | POA: Diagnosis not present

## 2018-07-06 DIAGNOSIS — I10 Essential (primary) hypertension: Secondary | ICD-10-CM | POA: Diagnosis not present

## 2018-07-06 DIAGNOSIS — M48061 Spinal stenosis, lumbar region without neurogenic claudication: Secondary | ICD-10-CM | POA: Diagnosis not present

## 2018-07-06 DIAGNOSIS — Z981 Arthrodesis status: Secondary | ICD-10-CM | POA: Diagnosis not present

## 2018-07-06 DIAGNOSIS — E785 Hyperlipidemia, unspecified: Secondary | ICD-10-CM | POA: Diagnosis not present

## 2018-07-06 DIAGNOSIS — M5106 Intervertebral disc disorders with myelopathy, lumbar region: Secondary | ICD-10-CM | POA: Diagnosis not present

## 2018-07-06 DIAGNOSIS — Z4781 Encounter for orthopedic aftercare following surgical amputation: Secondary | ICD-10-CM | POA: Diagnosis not present

## 2018-07-06 DIAGNOSIS — Z7982 Long term (current) use of aspirin: Secondary | ICD-10-CM | POA: Diagnosis not present

## 2018-07-06 DIAGNOSIS — M1990 Unspecified osteoarthritis, unspecified site: Secondary | ICD-10-CM | POA: Diagnosis not present

## 2018-07-08 DIAGNOSIS — Z87891 Personal history of nicotine dependence: Secondary | ICD-10-CM | POA: Diagnosis not present

## 2018-07-08 DIAGNOSIS — Z7982 Long term (current) use of aspirin: Secondary | ICD-10-CM | POA: Diagnosis not present

## 2018-07-08 DIAGNOSIS — M5106 Intervertebral disc disorders with myelopathy, lumbar region: Secondary | ICD-10-CM | POA: Diagnosis not present

## 2018-07-08 DIAGNOSIS — Z981 Arthrodesis status: Secondary | ICD-10-CM | POA: Diagnosis not present

## 2018-07-08 DIAGNOSIS — M1990 Unspecified osteoarthritis, unspecified site: Secondary | ICD-10-CM | POA: Diagnosis not present

## 2018-07-08 DIAGNOSIS — E785 Hyperlipidemia, unspecified: Secondary | ICD-10-CM | POA: Diagnosis not present

## 2018-07-08 DIAGNOSIS — M48061 Spinal stenosis, lumbar region without neurogenic claudication: Secondary | ICD-10-CM | POA: Diagnosis not present

## 2018-07-08 DIAGNOSIS — Z4781 Encounter for orthopedic aftercare following surgical amputation: Secondary | ICD-10-CM | POA: Diagnosis not present

## 2018-07-08 DIAGNOSIS — I10 Essential (primary) hypertension: Secondary | ICD-10-CM | POA: Diagnosis not present

## 2018-07-09 ENCOUNTER — Encounter: Payer: Medicare HMO | Admitting: Registered Nurse

## 2018-07-09 DIAGNOSIS — Z7982 Long term (current) use of aspirin: Secondary | ICD-10-CM | POA: Diagnosis not present

## 2018-07-09 DIAGNOSIS — Z87891 Personal history of nicotine dependence: Secondary | ICD-10-CM | POA: Diagnosis not present

## 2018-07-09 DIAGNOSIS — M48061 Spinal stenosis, lumbar region without neurogenic claudication: Secondary | ICD-10-CM | POA: Diagnosis not present

## 2018-07-09 DIAGNOSIS — Z4781 Encounter for orthopedic aftercare following surgical amputation: Secondary | ICD-10-CM | POA: Diagnosis not present

## 2018-07-09 DIAGNOSIS — E785 Hyperlipidemia, unspecified: Secondary | ICD-10-CM | POA: Diagnosis not present

## 2018-07-09 DIAGNOSIS — I10 Essential (primary) hypertension: Secondary | ICD-10-CM | POA: Diagnosis not present

## 2018-07-09 DIAGNOSIS — M1990 Unspecified osteoarthritis, unspecified site: Secondary | ICD-10-CM | POA: Diagnosis not present

## 2018-07-09 DIAGNOSIS — Z981 Arthrodesis status: Secondary | ICD-10-CM | POA: Diagnosis not present

## 2018-07-09 DIAGNOSIS — M5106 Intervertebral disc disorders with myelopathy, lumbar region: Secondary | ICD-10-CM | POA: Diagnosis not present

## 2018-07-16 DIAGNOSIS — Z4781 Encounter for orthopedic aftercare following surgical amputation: Secondary | ICD-10-CM

## 2018-07-16 DIAGNOSIS — Z7982 Long term (current) use of aspirin: Secondary | ICD-10-CM

## 2018-07-16 DIAGNOSIS — M48061 Spinal stenosis, lumbar region without neurogenic claudication: Secondary | ICD-10-CM

## 2018-07-16 DIAGNOSIS — Z981 Arthrodesis status: Secondary | ICD-10-CM

## 2018-07-16 DIAGNOSIS — M5106 Intervertebral disc disorders with myelopathy, lumbar region: Secondary | ICD-10-CM

## 2018-07-16 DIAGNOSIS — E785 Hyperlipidemia, unspecified: Secondary | ICD-10-CM

## 2018-07-16 DIAGNOSIS — Z87891 Personal history of nicotine dependence: Secondary | ICD-10-CM | POA: Diagnosis not present

## 2018-07-16 DIAGNOSIS — M1990 Unspecified osteoarthritis, unspecified site: Secondary | ICD-10-CM

## 2018-07-16 DIAGNOSIS — I10 Essential (primary) hypertension: Secondary | ICD-10-CM

## 2018-07-22 ENCOUNTER — Ambulatory Visit: Payer: Medicare HMO | Attending: Orthopedic Surgery | Admitting: Physical Therapy

## 2018-07-22 ENCOUNTER — Encounter: Payer: Self-pay | Admitting: Physical Therapy

## 2018-07-22 ENCOUNTER — Other Ambulatory Visit: Payer: Self-pay

## 2018-07-22 DIAGNOSIS — M5441 Lumbago with sciatica, right side: Secondary | ICD-10-CM | POA: Diagnosis not present

## 2018-07-22 DIAGNOSIS — R262 Difficulty in walking, not elsewhere classified: Secondary | ICD-10-CM

## 2018-07-22 DIAGNOSIS — M6281 Muscle weakness (generalized): Secondary | ICD-10-CM | POA: Diagnosis not present

## 2018-07-22 NOTE — Therapy (Signed)
`[facility] [address] [place], [place], [address] Phone: [phone]   Fax:  [phone]  Physical Therapy Evaluation  Patient Details  Name: Joseph Moyer MRN: [id number] Date of Birth: 22-May-1940 Referring Provider (PT): D. Rolena Infante   Encounter Date: 07/22/2018  PT End of Session - 07/22/18 1517    Visit Number  1    Date for PT Re-Evaluation  09/21/18    PT Start Time  1435    PT Stop Time  1518    PT Time Calculation (min)  43 min    Activity Tolerance  Patient tolerated treatment well    Behavior During Therapy  [facility] for tasks assessed/performed       Past Medical History:  Diagnosis Date  . Allergy   . Arthritis   . Hyperlipidemia   . Hypertension     Past Surgical History:  Procedure Laterality Date  . CIRCUMCISION    . COLONOSCOPY WITH PROPOFOL N/A 05/10/2015   Procedure: COLONOSCOPY WITH PROPOFOL;  Surgeon: Garlan Fair, MD;  Location: [facility];  Service: Endoscopy;  Laterality: N/A;  . DECOMPRESSIVE LUMBAR LAMINECTOMY LEVEL 1 N/A 06/15/2018   Procedure: Gordy Levan decompression L1-L2/ complete inferior facetectomyof L1. Posterior lateral arthrodesiswith autograft bone L1-L2.;  Surgeon: Melina Schools, MD;  Location: [facility];  Service: Orthopedics;  Laterality: N/A;  . HEMORRHOID SURGERY      There were no vitals filed for this visit.   Subjective Assessment - 07/22/18 1444    Subjective  Patient reports that in early May he tried to lift a 50# bag of fertilizer after mowing the lawn, reports that he felt a pop. He reports that over the next few days the pain became severe.  He underwent a 1 level fusion at L1-2 on 06/15/18.  He had some PT at home for a few visits, reports no problems,  He reports that his biggest issue is weakness of the right LE and it gives out.    Limitations  Lifting;Standing;Walking;House hold activities    Patient Stated Goals  have the right leg be stronger, walk without  device.    Currently in Pain?  Yes    Pain Score  0-No pain    Pain Location  Back    Pain Orientation  Lower    Pain Descriptors / Indicators  Aching    Pain Type  Acute pain;Surgical pain    Pain Radiating Towards  some numbness in the right thigh    Pain Onset  More than a month ago    Pain Frequency  Intermittent    Aggravating Factors   up longer perioeds pain up to 5/10    Pain Relieving Factors  rest, some pain meds, pain can be 0/10    Effect of Pain on Daily Activities  limits walking, and all ADL's         [facility] PT Assessment - 07/22/18 0001      Assessment   Medical Diagnosis  s/p lumbar fusion L1-2    Referring Provider (PT)  D. Brooks    Onset Date/Surgical Date  06/15/18    Prior Therapy  at home      Precautions   Precaution Comments  No bending, lifting and twisting      Balance Screen   Has the patient fallen in the past 6 months  No    Has the patient had a decrease in activity level because of a fear of falling?  No    Is the patient reluctant to leave their home because of a fear of falling?   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    Leisure  golf some, walk some      Posture/Postural Control   Posture Comments  slouched sitting posture      ROM / Strength   AROM / PROM / Strength  AROM      AROM   AROM Assessment Site  Hip;Knee    Right/Left Hip  Right    Right Hip Flexion  40    Right Hip ABduction  10    Right/Left Knee  Right    Right Knee Extension  18    Right Knee Flexion  80      Strength   Right Hip Flexion  2/5    Left Hip Flexion  4-/5    Right Knee Flexion  2/5    Right Knee Extension  2+/5    Left Knee Flexion  4/5    Left Knee Extension  4/5    Right Ankle Dorsiflexion  3/5    Left Ankle Dorsiflexion  4+/5       Palpation   Palpation comment  non tender      Ambulation/Gait   Gait Comments  uses a FWW, slow gait, stooped posture, has a brace on the ankle to help with DF, has a brace on the knee and a brace on the back,, has to do stairs one at a time      Standardized Balance Assessment   Standardized Balance Assessment  Timed Up and Go Test      Timed Up and Go Test   Normal TUG (seconds)  18                Objective measurements completed on examination: See above findings.      [facility] Adult PT Treatment/Exercise - 07/22/18 0001      Exercises   Exercises  Knee/Hip      Knee/Hip Exercises: Aerobic   Nustep  level 5 x 6 minutes      Knee/Hip Exercises: Machines for Strengthening   Total Gym Leg Press  20# right leg only, at position #11 he could do, at # 10 he could not do due to the angle and the weakness             PT Education - 07/22/18 1516    Education Details  reviewed the HEP he is doing, added small bridges    Person(s) Educated  Patient    Methods  Explanation;Demonstration    Comprehension  Verbalized understanding       PT Short Term Goals - 07/22/18 1521      PT SHORT TERM GOAL #1   Title  independent with HEP    Time  3    Period  Weeks    Status  New        PT Long Term Goals - 07/22/18 1521      PT LONG TERM GOAL #1   Title  walk 300 feet with SPC or a less restrictive device    Time  8    Period  Weeks    Status  New      PT LONG TERM GOAL #2   Title  go up and down stairs step over step    Time  8    Period  Weeks    Status  New      PT LONG TERM GOAL #3   Title  increase strength of the right hip to 4/5    Time  8    Period  Weeks    Status  New      PT LONG TERM GOAL #4   Title  increase right knee extension to full    Time  8    Period  Weeks    Status  New             Plan - 07/22/18 1518    Clinical Impression Statement  Patient reports that in early may he was lifting a 50# bag and felt a pop.  reports that over the next few days the pain became unbearable and he started to lose strength and use of the right LE.  He underwent a L1-2 lumbar fusion on 06/15/18.  He has weakness of the right hip, knee and ankle, he wears a brace on the ankle the knee and his back.  His gait is with a FWW, has to go up and down stairs one at a time due to the right LE weakness.    Stability/Clinical Decision Making  Evolving/Moderate complexity    Clinical Decision Making  Moderate    Rehab Potential  Good    PT Frequency  2x / week    PT Duration  8 weeks    PT Treatment/Interventions  ADLs/Self Care Home Management;Electrical Stimulation;Moist Heat;Cryotherapy;Ultrasound;Therapeutic activities;Therapeutic exercise;Balance training;Neuromuscular re-education;Manual techniques;Patient/family education    PT Next Visit Plan  slowly add exercises and gait, the right LE is very weak    Consulted and Agree with Plan of Care  Patient       Patient will benefit from skilled therapeutic intervention in order to improve the following deficits and impairments:  Abnormal gait, Pain, Improper body mechanics, Increased muscle spasms, Decreased mobility, Cardiopulmonary status limiting activity, Decreased activity tolerance, Decreased endurance, Decreased range of motion, Decreased strength, Impaired flexibility, Difficulty walking, Decreased balance  Visit Diagnosis: 1. Muscle weakness (generalized)   2. Difficulty in walking, not elsewhere classified   3. Acute bilateral low back pain with right-sided sciatica        Problem List Patient Active Problem List   Diagnosis Date Noted  . 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 S/P lumbar fusion 06/15/2018  . 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 Overweight (BMI 25.0-29.9) 07/31/2014  . Acute diverticulitis 07/30/2014  . Elevated BP 07/30/2014  . Hyponatremia 07/30/2014  . Hyperlipidemia 07/30/2014  . HTN (hypertension) 07/30/2014    Sumner Boast.,  PT 07/22/2018, 3:25 PM  [address] [address] [place], [place], [address] Phone: [phone]   Fax:  [phone]  Name: DIAZ CRAGO MRN: [id number] Date of Birth: Dec 25, 1940

## 2018-07-24 ENCOUNTER — Encounter: Payer: Self-pay | Admitting: Physical Therapy

## 2018-07-24 ENCOUNTER — Other Ambulatory Visit: Payer: Self-pay

## 2018-07-24 ENCOUNTER — Ambulatory Visit: Payer: Medicare HMO | Admitting: Physical Therapy

## 2018-07-24 DIAGNOSIS — M6281 Muscle weakness (generalized): Secondary | ICD-10-CM | POA: Diagnosis not present

## 2018-07-24 DIAGNOSIS — M5441 Lumbago with sciatica, right side: Secondary | ICD-10-CM | POA: Diagnosis not present

## 2018-07-24 DIAGNOSIS — R262 Difficulty in walking, not elsewhere classified: Secondary | ICD-10-CM | POA: Diagnosis not present

## 2018-07-24 NOTE — Therapy (Signed)
[address] [place], [place], [address] Phone: [phone]   Fax:  [phone]  Physical Therapy Treatment  Patient Details  Name: Joseph Moyer MRN: [id number] Date of Birth: Jun 23, 1940 Referring Provider (PT): D. Rolena Infante   Encounter Date: 07/24/2018  PT End of Session - 07/24/18 1231    Visit Number  2    Date for PT Re-Evaluation  09/21/18    Authorization - Number of Visits  1226    PT Start Time  1761    Activity Tolerance  Patient tolerated treatment well    Behavior During Therapy  [facility] for tasks assessed/performed       Past Medical History:  Diagnosis Date  . Allergy   . Arthritis   . Hyperlipidemia   . Hypertension     Past Surgical History:  Procedure Laterality Date  . CIRCUMCISION    . COLONOSCOPY WITH PROPOFOL N/A 05/10/2015   Procedure: COLONOSCOPY WITH PROPOFOL;  Surgeon: Garlan Fair, MD;  [facility];  Service: Endoscopy;  Laterality: N/A;  . DECOMPRESSIVE LUMBAR LAMINECTOMY LEVEL 1 N/A 06/15/2018   Procedure: Gordy Levan decompression L1-L2/ complete inferior facetectomyof L1. Posterior lateral arthrodesiswith autograft bone L1-L2.;  Surgeon: Melina Schools, MD;  Location: [place];  Service: Orthopedics;  Laterality: N/A;  . HEMORRHOID SURGERY      There were no vitals filed for this visit.  Subjective Assessment - 07/24/18 1149    Subjective  "Just trying to get alone"    Currently in Pain?  Yes    Pain Score  3     Pain Location  Back    Pain Orientation  Lower;Right                       [facility] Adult PT Treatment/Exercise - 07/24/18 0001      Exercises   Exercises  Knee/Hip      Knee/Hip Exercises: Aerobic   Recumbent Bike  L1 x 27mn     Nustep  level 5 x 6 minutes      Knee/Hip Exercises: Machines for Strengthening   Total Gym Leg Press  20lb 2x10, LLE 20lb 2x5        Knee/Hip Exercises: Standing   Other Standing Knee Exercises  RRL  boc taps x10 then 2lb cuff x10      Knee/Hip Exercises: Seated   Long Arc Quad  Strengthening;Right;2 sets;10 reps    Long Arc Quad Weight  2 lbs.    Ball Squeeze  2x10    Marching  Right;2 sets;10 reps;Weights    Marching Weights  2 lbs.    Abduction/Adduction   Both;2 sets;10 reps    Abd/Adduction Limitations  green tband               PT Short Term Goals - 07/24/18 1232      PT SHORT TERM GOAL #1   Title  independent with HEP    Status  Partially Met        PT Long Term Goals - 07/22/18 1521      PT LONG TERM GOAL #1   Title  walk 300 feet with SPC or a less restrictive device    Time  8    Period  Weeks    Status  New      PT LONG TERM GOAL #2   Title  go up and down stairs step over step    Time  8    Period  Weeks    Status  New      PT LONG TERM GOAL #3   Title  increase strength of the right hip to 4/5    Time  8    Period  Weeks    Status  New      PT LONG TERM GOAL #4   Title  increase right knee extension to full    Time  8    Period  Weeks    Status  New            Plan - 07/24/18 1233    Clinical Impression Statement  Pt tolerated an initial progression to TE well. His RLE is very weak, cues needed throughout session to perform full ROM. No reports of increase pain. Assist needed with SL on leg press due to weakness. Pt becomes very fatigue with activity needing frequent rest.    Stability/Clinical Decision Making  Evolving/Moderate complexity    Rehab Potential  Good    PT Frequency  2x / week    PT Duration  8 weeks    PT Treatment/Interventions  ADLs/Self Care Home Management;Electrical Stimulation;Moist Heat;Cryotherapy;Ultrasound;Therapeutic activities;Therapeutic exercise;Balance training;Neuromuscular re-education;Manual techniques;Patient/family education    PT Next Visit Plan  slowly add exercises and gait, the right LE is very weak       Patient will benefit from skilled therapeutic intervention in order to improve the  following deficits and impairments:  Abnormal gait, Pain, Improper body mechanics, Increased muscle spasms, Decreased mobility, Cardiopulmonary status limiting activity, Decreased activity tolerance, Decreased endurance, Decreased range of motion, Decreased strength, Impaired flexibility, Difficulty walking, Decreased balance  Visit Diagnosis: 1. Muscle weakness (generalized)   2. Difficulty in walking, not elsewhere classified   3. Acute bilateral low back pain with right-sided sciatica        Problem List Patient Active Problem List   Diagnosis Date Noted  . 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 S/P lumbar fusion 06/15/2018  . 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 Overweight (BMI 25.0-29.9) 07/31/2014  . Acute diverticulitis 07/30/2014  . Elevated BP 07/30/2014  . Hyponatremia 07/30/2014  . Hyperlipidemia 07/30/2014  . HTN (hypertension) 07/30/2014    Scot Jun, PTA 07/24/2018, 12:41 PM  [address] [place], [place], [address] Phone: [phone]   Fax:  [phone]  Name: Joseph Moyer MRN: [id number] Date of Birth: 12-Aug-1940

## 2018-07-29 DIAGNOSIS — Z4889 Encounter for other specified surgical aftercare: Secondary | ICD-10-CM | POA: Diagnosis not present

## 2018-07-29 DIAGNOSIS — M25561 Pain in right knee: Secondary | ICD-10-CM | POA: Diagnosis not present

## 2018-07-30 ENCOUNTER — Ambulatory Visit: Payer: Medicare HMO | Admitting: Physical Therapy

## 2018-08-01 ENCOUNTER — Other Ambulatory Visit: Payer: Self-pay

## 2018-08-01 ENCOUNTER — Encounter: Payer: Self-pay | Admitting: Physical Therapy

## 2018-08-01 ENCOUNTER — Ambulatory Visit: Payer: Medicare HMO | Attending: Orthopedic Surgery | Admitting: Physical Therapy

## 2018-08-01 DIAGNOSIS — M5441 Lumbago with sciatica, right side: Secondary | ICD-10-CM | POA: Diagnosis not present

## 2018-08-01 DIAGNOSIS — G959 Disease of spinal cord, unspecified: Secondary | ICD-10-CM | POA: Diagnosis not present

## 2018-08-01 DIAGNOSIS — R262 Difficulty in walking, not elsewhere classified: Secondary | ICD-10-CM | POA: Diagnosis not present

## 2018-08-01 DIAGNOSIS — M6281 Muscle weakness (generalized): Secondary | ICD-10-CM | POA: Insufficient documentation

## 2018-08-01 NOTE — Therapy (Signed)
[address] [place], [place], [address] Phone: [phone]   Fax:  [phone]  Physical Therapy Treatment  Patient Details  Name: Joseph Moyer MRN: [id number] Date of Birth: 1941-01-19 Referring Provider (PT): D. Rolena Infante   Encounter Date: 08/01/2018  PT End of Session - 08/01/18 1545    Visit Number  3    Date for PT Re-Evaluation  09/21/18    Authorization - Number of Visits  1226    PT Start Time  1500    PT Stop Time  1545    PT Time Calculation (min)  45 min    Activity Tolerance  Patient tolerated treatment well    Behavior During Therapy  [facility] for tasks assessed/performed       Past Medical History:  Diagnosis Date  . Allergy   . Arthritis   . Hyperlipidemia   . Hypertension     Past Surgical History:  Procedure Laterality Date  . CIRCUMCISION    . COLONOSCOPY WITH PROPOFOL N/A 05/10/2015   Procedure: COLONOSCOPY WITH PROPOFOL;  Surgeon: Garlan Fair, MD;  [facility];  Service: Endoscopy;  Laterality: N/A;  . DECOMPRESSIVE LUMBAR LAMINECTOMY LEVEL 1 N/A 06/15/2018   Procedure: Gordy Levan decompression L1-L2/ complete inferior facetectomyof L1. Posterior lateral arthrodesiswith autograft bone L1-L2.;  Surgeon: Melina Schools, MD;  Location: [place];  Service: Orthopedics;  Laterality: N/A;  . HEMORRHOID SURGERY      There were no vitals filed for this visit.  Subjective Assessment - 08/01/18 1455    Subjective  Pt reports some muscle soreness and a little R knee pain after last session. Pt reports getting an injection in R knee Monday    Patient Stated Goals  have the right leg be stronger, walk without device.    Currently in Pain?  No/denies                       [facility] Adult PT Treatment/Exercise - 08/01/18 0001      Knee/Hip Exercises: Aerobic   Recumbent Bike  L1 x 52min     Nustep  level 4 x 6 minutes      Knee/Hip Exercises: Machines for Strengthening    Cybex Knee Extension  5lb 2x10     Cybex Knee Flexion  20lb 2x10     Total Gym Leg Press  30lb 3x10       Knee/Hip Exercises: Standing   Other Standing Knee Exercises  Standing rows and extensions red 2x10       Knee/Hip Exercises: Seated   Long Arc Quad  Strengthening;Right;2 sets;10 reps    Long Arc Quad Weight  2 lbs.    Ball Squeeze  2x10    Marching  Right;2 sets;10 reps;Weights    Marching Weights  2 lbs.    Hamstring Curl  Right;2 sets;15 reps    Hamstring Limitations  green Tband    Abduction/Adduction   Both;2 sets;10 reps    Abd/Adduction Limitations  green tband    Sit to Sand  2 sets;10 reps;without UE support   elevated and lowered UBE seat               PT Short Term Goals - 08/01/18 1551      PT SHORT TERM GOAL #1   Title  independent with HEP    Status  Achieved        PT Long Term Goals - 07/22/18 1521  PT LONG TERM GOAL #1   Title  walk 300 feet with SPC or a less restrictive device    Time  8    Period  Weeks    Status  New      PT LONG TERM GOAL #2   Title  go up and down stairs step over step    Time  8    Period  Weeks    Status  New      PT LONG TERM GOAL #3   Title  increase strength of the right hip to 4/5    Time  8    Period  Weeks    Status  New      PT LONG TERM GOAL #4   Title  increase right knee extension to full    Time  8    Period  Weeks    Status  New            Plan - 08/01/18 1546    Clinical Impression Statement  Pt stated that he had some knee pain after last treatment session so eliminated SL on the leg press. Pt tolerated all of the exercises well. Some weakness exposes with LAQ. Some compensation with sit to stands, pt tends to lean to his R when standing. Cues with seated leg extensions to keep RLE in contact with pad near end range.    Stability/Clinical Decision Making  Evolving/Moderate complexity    Rehab Potential  Good    PT Treatment/Interventions  ADLs/Self Care Home  Management;Electrical Stimulation;Moist Heat;Cryotherapy;Ultrasound;Therapeutic activities;Therapeutic exercise;Balance training;Neuromuscular re-education;Manual techniques;Patient/family education    PT Next Visit Plan  slowly add exercises and gait, the right LE is very weak       Patient will benefit from skilled therapeutic intervention in order to improve the following deficits and impairments:  Abnormal gait, Pain, Improper body mechanics, Increased muscle spasms, Decreased mobility, Cardiopulmonary status limiting activity, Decreased activity tolerance, Decreased endurance, Decreased range of motion, Decreased strength, Impaired flexibility, Difficulty walking, Decreased balance  Visit Diagnosis: 1. Difficulty in walking, not elsewhere classified   2. Acute bilateral low back pain with right-sided sciatica        Problem List Patient Active Problem List   Diagnosis Date Noted  . 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 S/P lumbar fusion 06/15/2018  . 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 Overweight (BMI 25.0-29.9) 07/31/2014  . Acute diverticulitis 07/30/2014  . Elevated BP 07/30/2014  . Hyponatremia 07/30/2014  . Hyperlipidemia 07/30/2014  . HTN (hypertension) 07/30/2014    Scot Jun, PTA 08/01/2018, 3:51 PM  [address] [place], [place], [address] Phone: [phone]   Fax:  [phone]  Name: Joseph Moyer MRN: [id number] Date of Birth: 1940/03/23

## 2018-08-05 ENCOUNTER — Encounter: Payer: Self-pay | Admitting: Physical Therapy

## 2018-08-05 ENCOUNTER — Ambulatory Visit: Payer: Medicare HMO | Admitting: Physical Therapy

## 2018-08-05 ENCOUNTER — Other Ambulatory Visit: Payer: Self-pay

## 2018-08-05 DIAGNOSIS — G959 Disease of spinal cord, unspecified: Secondary | ICD-10-CM | POA: Diagnosis not present

## 2018-08-05 DIAGNOSIS — R262 Difficulty in walking, not elsewhere classified: Secondary | ICD-10-CM

## 2018-08-05 DIAGNOSIS — M5441 Lumbago with sciatica, right side: Secondary | ICD-10-CM

## 2018-08-05 DIAGNOSIS — M6281 Muscle weakness (generalized): Secondary | ICD-10-CM | POA: Diagnosis not present

## 2018-08-05 NOTE — Therapy (Signed)
[address] [place], [place], [address] Phone: [phone]   Fax:  [phone]  Physical Therapy Treatment  Patient Details  Name: Joseph Moyer MRN: [id number] Date of Birth: 1941-01-29 Referring Provider (PT): D. Rolena Infante   Encounter Date: 08/05/2018  PT End of Session - 08/05/18 1610    Visit Number  4    Date for PT Re-Evaluation  09/21/18    PT Start Time  6812    PT Stop Time  7517    PT Time Calculation (min)  43 min    Behavior During Therapy  [facility] for tasks assessed/performed       Past Medical History:  Diagnosis Date  . Allergy   . Arthritis   . Hyperlipidemia   . Hypertension     Past Surgical History:  Procedure Laterality Date  . CIRCUMCISION    . COLONOSCOPY WITH PROPOFOL N/A 05/10/2015   Procedure: COLONOSCOPY WITH PROPOFOL;  Surgeon: Garlan Fair, MD;  [facility];  Service: Endoscopy;  Laterality: N/A;  . DECOMPRESSIVE LUMBAR LAMINECTOMY LEVEL 1 N/A 06/15/2018   Procedure: Gordy Levan decompression L1-L2/ complete inferior facetectomyof L1. Posterior lateral arthrodesiswith autograft bone L1-L2.;  Surgeon: Melina Schools, MD;  Location: [place];  Service: Orthopedics;  Laterality: N/A;  . HEMORRHOID SURGERY      There were no vitals filed for this visit.  Subjective Assessment - 08/05/18 1537    Subjective  Patient reports feeling pretty good.  Feels like he is moving better    Currently in Pain?  No/denies                       [facility] Adult PT Treatment/Exercise - 08/05/18 0001      Knee/Hip Exercises: Aerobic   Nustep  level 5 x 6 minutes      Knee/Hip Exercises: Machines for Strengthening   Cybex Knee Extension  10# 2x10, right only 5# working on TKE not full ROM    Cybex Knee Flexion  25# 2x10    Total Gym Leg Press  30lb 3x10 , then no wieght right only 2x10    Other Machine  20# seated row, 20# lat pulls      Knee/Hip Exercises: Standing   Hip Flexion  Right;2 sets;10 reps    Hip Flexion Limitations  2.5#    Hip Abduction  Right;2 sets;10 reps    Abduction Limitations  2.5#    Hip Extension  Right;2 sets;10 reps    Extension Limitations  2.5#    Walking with Sports Cord  all directions                PT Short Term Goals - 08/01/18 1551      PT SHORT TERM GOAL #1   Title  independent with HEP    Status  Achieved        PT Long Term Goals - 07/22/18 1521      PT LONG TERM GOAL #1   Title  walk 300 feet with SPC or a less restrictive device    Time  8    Period  Weeks    Status  New      PT LONG TERM GOAL #2   Title  go up and down stairs step over step    Time  8    Period  Weeks    Status  New      PT LONG TERM GOAL #  3   Title  increase strength of the right hip to 4/5    Time  8    Period  Weeks    Status  New      PT LONG TERM GOAL #4   Title  increase right knee extension to full    Time  8    Period  Weeks    Status  New            Plan - 08/05/18 1611    Clinical Impression Statement  Patient fatigued easily with some of the resisted walking, he did well otherwise, at times he goes very fast and needs a lot of cues to slow down on the exercise and get good mm contraction    PT Treatment/Interventions  ADLs/Self Care Home Management;Electrical Stimulation;Moist Heat;Cryotherapy;Ultrasound;Therapeutic activities;Therapeutic exercise;Balance training;Neuromuscular re-education;Manual techniques;Patient/family education    PT Next Visit Plan  continue to push strength as he tolerates    Consulted and Agree with Plan of Care  Patient       Patient will benefit from skilled therapeutic intervention in order to improve the following deficits and impairments:  Abnormal gait, Pain, Improper body mechanics, Increased muscle spasms, Decreased mobility, Cardiopulmonary status limiting activity, Decreased activity tolerance, Decreased endurance, Decreased range of motion, Decreased strength,  Impaired flexibility, Difficulty walking, Decreased balance  Visit Diagnosis: 1. Difficulty in walking, not elsewhere classified   2. Acute bilateral low back pain with right-sided sciatica   3. Muscle weakness (generalized)        Problem List Patient Active Problem List   Diagnosis Date Noted  . 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 S/P lumbar fusion 06/15/2018  . 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 Overweight (BMI 25.0-29.9) 07/31/2014  . Acute diverticulitis 07/30/2014  . Elevated BP 07/30/2014  . Hyponatremia 07/30/2014  . Hyperlipidemia 07/30/2014  . HTN (hypertension) 07/30/2014    Sumner Boast., PT 08/05/2018, 4:13 PM  [address] [place], [place], [address] Phone: [phone]   Fax:  [phone]  Name: Joseph Moyer MRN: [id number] Date of Birth: Apr 26, 1940

## 2018-08-07 ENCOUNTER — Ambulatory Visit: Payer: Medicare HMO | Admitting: Physical Therapy

## 2018-08-07 ENCOUNTER — Encounter: Payer: Self-pay | Admitting: Physical Therapy

## 2018-08-07 ENCOUNTER — Other Ambulatory Visit: Payer: Self-pay

## 2018-08-07 DIAGNOSIS — M6281 Muscle weakness (generalized): Secondary | ICD-10-CM

## 2018-08-07 DIAGNOSIS — G959 Disease of spinal cord, unspecified: Secondary | ICD-10-CM

## 2018-08-07 DIAGNOSIS — R262 Difficulty in walking, not elsewhere classified: Secondary | ICD-10-CM | POA: Diagnosis not present

## 2018-08-07 DIAGNOSIS — M5441 Lumbago with sciatica, right side: Secondary | ICD-10-CM | POA: Diagnosis not present

## 2018-08-07 NOTE — Therapy (Signed)
[address] [place], [place], [address] Phone: [phone]   Fax:  [phone]  Physical Therapy Treatment  Patient Details  Name: Joseph Moyer MRN: [id number] Date of Birth: 06/17/1940 Referring Provider (PT): D. Rolena Infante   Encounter Date: 08/07/2018  PT End of Session - 08/07/18 1434    Visit Number  5    Date for PT Re-Evaluation  09/21/18    PT Start Time  1340    PT Stop Time  1430    PT Time Calculation (min)  50 min    Activity Tolerance  Patient tolerated treatment well    Behavior During Therapy  [facility] for tasks assessed/performed       Past Medical History:  Diagnosis Date  . Allergy   . Arthritis   . Hyperlipidemia   . Hypertension     Past Surgical History:  Procedure Laterality Date  . CIRCUMCISION    . COLONOSCOPY WITH PROPOFOL N/A 05/10/2015   Procedure: COLONOSCOPY WITH PROPOFOL;  Surgeon: Garlan Fair, MD;  [facility];  Service: Endoscopy;  Laterality: N/A;  . DECOMPRESSIVE LUMBAR LAMINECTOMY LEVEL 1 N/A 06/15/2018   Procedure: Gordy Levan decompression L1-L2/ complete inferior facetectomyof L1. Posterior lateral arthrodesiswith autograft bone L1-L2.;  Surgeon: Melina Schools, MD;  Location: [place];  Service: Orthopedics;  Laterality: N/A;  . HEMORRHOID SURGERY      There were no vitals filed for this visit.  Subjective Assessment - 08/07/18 1341    Subjective  "Feeling pretty good"    Patient Stated Goals  have the right leg be stronger, walk without device.    Currently in Pain?  No/denies                       [facility] Adult PT Treatment/Exercise - 08/07/18 0001      Knee/Hip Exercises: Aerobic   Nustep  level 5 x 6 minutes      Knee/Hip Exercises: Machines for Strengthening   Cybex Knee Flexion  25# 2x10, RLE 20lb x10    Total Gym Leg Press  40lb 3x10 , then no wieght right only 2x10    Other Machine  20# seated row, 20# lat pulls      Knee/Hip Exercises: Standing   Hip Flexion  Right;2 sets;10 reps    Hip Flexion Limitations  2.5#    Hip Abduction  Right;2 sets;10 reps    Abduction Limitations  2.5#    Hip Extension  Right;2 sets;10 reps    Extension Limitations  2.5#    Forward Step Up  Right;2 sets;5 reps;Step Height: 6";Hand Hold: 0;Hand Hold: 1               PT Short Term Goals - 08/01/18 1551      PT SHORT TERM GOAL #1   Title  independent with HEP    Status  Achieved        PT Long Term Goals - 08/07/18 1436      PT LONG TERM GOAL #2   Title  go up and down stairs step over step    Status  On-going            Plan - 08/07/18 1436    Clinical Impression Statement  patient became very fatigue with standing hip exercises. Constant verbal and tactile cues needed for posture and to prevent leaning. Increase weight tolerated on leg press without issue. Some R quat TKE weakness noted  with step ups.    Stability/Clinical Decision Making  Evolving/Moderate complexity    Rehab Potential  Good    PT Frequency  2x / week    PT Duration  8 weeks    PT Treatment/Interventions  ADLs/Self Care Home Management;Electrical Stimulation;Moist Heat;Cryotherapy;Ultrasound;Therapeutic activities;Therapeutic exercise;Balance training;Neuromuscular re-education;Manual techniques;Patient/family education    PT Next Visit Plan  continue to push strength as he tolerates       Patient will benefit from skilled therapeutic intervention in order to improve the following deficits and impairments:  Abnormal gait, Pain, Improper body mechanics, Increased muscle spasms, Decreased mobility, Cardiopulmonary status limiting activity, Decreased activity tolerance, Decreased endurance, Decreased range of motion, Decreased strength, Impaired flexibility, Difficulty walking, Decreased balance  Visit Diagnosis: 1. Difficulty in walking, not elsewhere classified   2. Acute bilateral low back pain with right-sided sciatica   3.  Muscle weakness (generalized)   4. Myelopathy [facility])        Problem List Patient Active Problem List   Diagnosis Date Noted  . 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 S/P lumbar fusion 06/15/2018  . 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 Overweight (BMI 25.0-29.9) 07/31/2014  . Acute diverticulitis 07/30/2014  . Elevated BP 07/30/2014  . Hyponatremia 07/30/2014  . Hyperlipidemia 07/30/2014  . HTN (hypertension) 07/30/2014    Scot Jun, PTA 08/07/2018, 2:40 PM  [address] [place], [place], [address] Phone: [phone]   Fax:  [phone]  Name: Joseph Moyer MRN: [id number] Date of Birth: 12-Jan-1941

## 2018-08-12 ENCOUNTER — Ambulatory Visit: Payer: Medicare HMO | Admitting: Physical Therapy

## 2018-08-12 ENCOUNTER — Encounter: Payer: Self-pay | Admitting: Physical Therapy

## 2018-08-12 ENCOUNTER — Other Ambulatory Visit: Payer: Self-pay

## 2018-08-12 DIAGNOSIS — M5441 Lumbago with sciatica, right side: Secondary | ICD-10-CM

## 2018-08-12 DIAGNOSIS — R262 Difficulty in walking, not elsewhere classified: Secondary | ICD-10-CM | POA: Diagnosis not present

## 2018-08-12 DIAGNOSIS — G959 Disease of spinal cord, unspecified: Secondary | ICD-10-CM | POA: Diagnosis not present

## 2018-08-12 DIAGNOSIS — M6281 Muscle weakness (generalized): Secondary | ICD-10-CM | POA: Diagnosis not present

## 2018-08-12 NOTE — Therapy (Signed)
[address] [address] [place], [place], [address] Phone: [phone]   Fax:  [phone]  Physical Therapy Treatment  Patient Details  Name: Joseph Moyer MRN: [id number] Date of Birth: 05/10/1940 Referring Provider (PT): D. Rolena Infante   Encounter Date: 08/12/2018  PT End of Session - 08/12/18 1514    Visit Number  6    Date for PT Re-Evaluation  09/21/18    PT Start Time  1430    PT Stop Time  1514    PT Time Calculation (min)  44 min    Activity Tolerance  Patient tolerated treatment well    Behavior During Therapy  [facility] for tasks assessed/performed       Past Medical History:  Diagnosis Date  . Allergy   . Arthritis   . Hyperlipidemia   . Hypertension     Past Surgical History:  Procedure Laterality Date  . CIRCUMCISION    . COLONOSCOPY WITH PROPOFOL N/A 05/10/2015   Procedure: COLONOSCOPY WITH PROPOFOL;  Surgeon: Garlan Fair, MD;  [facility];  Service: Endoscopy;  Laterality: N/A;  . DECOMPRESSIVE LUMBAR LAMINECTOMY LEVEL 1 N/A 06/15/2018   Procedure: Gordy Levan decompression L1-L2/ complete inferior facetectomyof L1. Posterior lateral arthrodesiswith autograft bone L1-L2.;  Surgeon: Melina Schools, MD;  Location: [place];  Service: Orthopedics;  Laterality: N/A;  . HEMORRHOID SURGERY      There were no vitals filed for this visit.  Subjective Assessment - 08/12/18 1433    Subjective  "Doing ok, getting better"    Patient Stated Goals  have the right leg be stronger, walk without device.    Currently in Pain?  No/denies                       [facility] Adult PT Treatment/Exercise - 08/12/18 0001      Knee/Hip Exercises: Aerobic   Elliptical  I7 R3 x 2 min     Nustep  level 5 x 6 minutes      Knee/Hip Exercises: Machines for Strengthening   Cybex Knee Extension  10# 2x10, right only 5# working on TKE not full ROM    Cybex Knee Flexion  35# 2x10    Total Gym Leg Press  50lb  3x10    Other Machine  25# seated row, 20# lat pulls      Knee/Hip Exercises: Standing   Forward Step Up  Right;2 sets;5 reps;Step Height: 6";Hand Hold: 0;Hand Hold: 1    Walking with Sports Cord  Sport cord walking 30lb 4 way x 3 each     Other Standing Knee Exercises  STS from mat table x10 some lleaning to L       Knee/Hip Exercises: Seated   Long Arc Quad  Strengthening;Right;2 sets;10 reps    Long Arc Quad Weight  5 lbs.               PT Short Term Goals - 08/01/18 1551      PT SHORT TERM GOAL #1   Title  independent with HEP    Status  Achieved        PT Long Term Goals - 08/07/18 1436      PT LONG TERM GOAL #2   Title  go up and down stairs step over step    Status  On-going            Plan - 08/12/18 1515    Clinical Impression Statement  Pt continues to have some R quad weakness noted with step ups. Some postural cues needed with resisted gait. He reports no pain during today's session only fatigue. Pt did well with the 5lb increase with seated rows and lats.    Stability/Clinical Decision Making  Evolving/Moderate complexity    Rehab Potential  Good    PT Frequency  2x / week    PT Duration  8 weeks    PT Treatment/Interventions  ADLs/Self Care Home Management;Electrical Stimulation;Moist Heat;Cryotherapy;Ultrasound;Therapeutic activities;Therapeutic exercise;Balance training;Neuromuscular re-education;Manual techniques;Patient/family education    PT Next Visit Plan  continue to push strength as he tolerates       Patient will benefit from skilled therapeutic intervention in order to improve the following deficits and impairments:  Abnormal gait, Pain, Improper body mechanics, Increased muscle spasms, Decreased mobility, Cardiopulmonary status limiting activity, Decreased activity tolerance, Decreased endurance, Decreased range of motion, Decreased strength, Impaired flexibility, Difficulty walking, Decreased balance  Visit Diagnosis: 1. Difficulty  in walking, not elsewhere classified   2. Acute bilateral low back pain with right-sided sciatica   3. Muscle weakness (generalized)   4. Myelopathy [facility])        Problem List Patient Active Problem List   Diagnosis Date Noted  . 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 S/P lumbar fusion 06/15/2018  . 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 Overweight (BMI 25.0-29.9) 07/31/2014  . Acute diverticulitis 07/30/2014  . Elevated BP 07/30/2014  . Hyponatremia 07/30/2014  . Hyperlipidemia 07/30/2014  . HTN (hypertension) 07/30/2014    Scot Jun, PTA 08/12/2018, 3:17 PM  [place] [address] [place], [place], [address] Phone: [phone]   Fax:  [phone]  Name: Joseph Moyer MRN: [id number] Date of Birth: [birth year]

## 2018-08-15 ENCOUNTER — Ambulatory Visit: Payer: Medicare HMO | Admitting: Physical Therapy

## 2018-08-15 ENCOUNTER — Encounter: Payer: Self-pay | Admitting: Physical Therapy

## 2018-08-15 ENCOUNTER — Other Ambulatory Visit: Payer: Self-pay

## 2018-08-15 DIAGNOSIS — G959 Disease of spinal cord, unspecified: Secondary | ICD-10-CM

## 2018-08-15 DIAGNOSIS — M6281 Muscle weakness (generalized): Secondary | ICD-10-CM | POA: Diagnosis not present

## 2018-08-15 DIAGNOSIS — M5441 Lumbago with sciatica, right side: Secondary | ICD-10-CM

## 2018-08-15 DIAGNOSIS — R262 Difficulty in walking, not elsewhere classified: Secondary | ICD-10-CM | POA: Diagnosis not present

## 2018-08-15 NOTE — Therapy (Signed)
[address] [place], [place], [address] Phone: [phone]   Fax:  [phone]  Physical Therapy Treatment  Patient Details  Name: Joseph Moyer MRN: [id number] Date of Birth: 1940-02-27 Referring Provider (PT): D. Rolena Infante   Encounter Date: 08/15/2018  PT End of Session - 08/15/18 1516    Visit Number  7    Date for PT Re-Evaluation  09/21/18    PT Start Time  1438    PT Stop Time  1515    PT Time Calculation (min)  47 min    Activity Tolerance  Patient tolerated treatment well    Behavior During Therapy  [facility] for tasks assessed/performed       Past Medical History:  Diagnosis Date  . Allergy   . Arthritis   . Hyperlipidemia   . Hypertension     Past Surgical History:  Procedure Laterality Date  . CIRCUMCISION    . COLONOSCOPY WITH PROPOFOL N/A 05/10/2015   Procedure: COLONOSCOPY WITH PROPOFOL;  Surgeon: Garlan Fair, MD;  Location: [facility];  Service: Endoscopy;  Laterality: N/A;  . DECOMPRESSIVE LUMBAR LAMINECTOMY LEVEL 1 N/A 06/15/2018   Procedure: Gordy Levan decompression L1-L2/ complete inferior facetectomyof L1. Posterior lateral arthrodesiswith autograft bone L1-L2.;  Surgeon: Melina Schools, MD;  Location: [place];  Service: Orthopedics;  Laterality: N/A;  . HEMORRHOID SURGERY      There were no vitals filed for this visit.  Subjective Assessment - 08/15/18 1427    Subjective  "Feeling pretty good today, No pain"    Limitations  Lifting;Standing;Walking;House hold activities    Patient Stated Goals  have the right leg be stronger, walk without device.    Currently in Pain?  No/denies         [facility] PT Assessment - 08/15/18 0001      Strength   Right Hip Flexion  4-/5                   [facility] Adult PT Treatment/Exercise - 08/15/18 0001      Ambulation/Gait   Stairs  Yes    Stairs Assistance  6: Modified independent (Device/Increase time)    Stair Management  Technique  One rail Right;Alternating pattern    Number of Stairs  48    Height of Stairs  6    Gait Comments  RLE eccentric load weakness noted      Knee/Hip Exercises: Aerobic   Elliptical  I7 R3 x 2 min     Nustep  level 5 x 6 minutes      Knee/Hip Exercises: Machines for Strengthening   Cybex Knee Extension  10# 2x15, right only 5# working on TKE not full ROM    Cybex Knee Flexion  35# 2x10    Total Gym Leg Press  50lb 3x10    Other Machine  25# seated row, 20# lat pulls      Knee/Hip Exercises: Standing   Hip Flexion  Right;2 sets;Knee bent;15 reps    Hip Flexion Limitations  3    Lateral Step Up  Right;2 sets;10 reps;Hand Hold: 1;Step Height: 6"               PT Short Term Goals - 08/01/18 1551      PT SHORT TERM GOAL #1   Title  independent with HEP    Status  Achieved        PT Long Term Goals - 08/15/18 1514  PT LONG TERM GOAL #1   Title  walk 300 feet with SPC or a less restrictive device    Status  Partially Met      PT LONG TERM GOAL #3   Title  increase strength of the right hip to 4/5    Status  Achieved            Plan - 08/15/18 1514    Clinical Impression Statement  Pt did really well with today's activities. He has progressed meeting some LTG's. No reports of increase pain only fatigue with activities. He voiced that this should be his last visit due to co pay,    Stability/Clinical Decision Making  Evolving/Moderate complexity    Rehab Potential  Good    PT Frequency  2x / week    PT Duration  8 weeks    PT Treatment/Interventions  ADLs/Self Care Home Management;Electrical Stimulation;Moist Heat;Cryotherapy;Ultrasound;Therapeutic activities;Therapeutic exercise;Balance training;Neuromuscular re-education;Manual techniques;Patient/family education    PT Next Visit Plan  D/C PT       Patient will benefit from skilled therapeutic intervention in order to improve the following deficits and impairments:  Abnormal gait, Pain,  Improper body mechanics, Increased muscle spasms, Decreased mobility, Cardiopulmonary status limiting activity, Decreased activity tolerance, Decreased endurance, Decreased range of motion, Decreased strength, Impaired flexibility, Difficulty walking, Decreased balance  Visit Diagnosis: 1. Acute bilateral low back pain with right-sided sciatica   2. Difficulty in walking, not elsewhere classified   3. Muscle weakness (generalized)   4. Myelopathy [facility])        Problem List Patient Active Problem List   Diagnosis Date Noted  . 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 S/P lumbar fusion 06/15/2018  . 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 Overweight (BMI 25.0-29.9) 07/31/2014  . Acute diverticulitis 07/30/2014  . Elevated BP 07/30/2014  . Hyponatremia 07/30/2014  . Hyperlipidemia 07/30/2014  . HTN (hypertension) 07/30/2014   PHYSICAL THERAPY DISCHARGE SUMMARY  Visits from Start of Care: 7 Plan: Patient agrees to discharge.  Patient goals were partially met. Patient is being discharged due to financial reasons.  ?????       Scot Jun, PTA 08/15/2018, 3:16 PM  [address] [place], [place], [address] Phone: [phone]   Fax:  [phone]  Name: Joseph Moyer MRN: [id number] Date of Birth: 1940-05-01

## 2018-09-09 DIAGNOSIS — Z4889 Encounter for other specified surgical aftercare: Secondary | ICD-10-CM | POA: Diagnosis not present

## 2018-11-20 DIAGNOSIS — R69 Illness, unspecified: Secondary | ICD-10-CM | POA: Diagnosis not present

## 2019-02-06 DIAGNOSIS — M542 Cervicalgia: Secondary | ICD-10-CM | POA: Diagnosis not present

## 2019-02-10 DIAGNOSIS — M5030 Other cervical disc degeneration, unspecified cervical region: Secondary | ICD-10-CM | POA: Diagnosis not present

## 2019-02-10 DIAGNOSIS — M542 Cervicalgia: Secondary | ICD-10-CM | POA: Diagnosis not present

## 2019-02-18 DIAGNOSIS — H52209 Unspecified astigmatism, unspecified eye: Secondary | ICD-10-CM | POA: Diagnosis not present

## 2019-02-18 DIAGNOSIS — H5213 Myopia, bilateral: Secondary | ICD-10-CM | POA: Diagnosis not present

## 2019-02-18 DIAGNOSIS — H52223 Regular astigmatism, bilateral: Secondary | ICD-10-CM | POA: Diagnosis not present

## 2019-02-18 DIAGNOSIS — Z0100 Encounter for examination of eyes and vision without abnormal findings: Secondary | ICD-10-CM | POA: Diagnosis not present

## 2019-02-18 DIAGNOSIS — H524 Presbyopia: Secondary | ICD-10-CM | POA: Diagnosis not present

## 2019-03-22 ENCOUNTER — Other Ambulatory Visit: Payer: Self-pay | Admitting: Orthopedic Surgery

## 2019-03-22 DIAGNOSIS — M5412 Radiculopathy, cervical region: Secondary | ICD-10-CM | POA: Diagnosis not present

## 2019-03-25 DIAGNOSIS — M542 Cervicalgia: Secondary | ICD-10-CM | POA: Diagnosis not present

## 2019-03-25 DIAGNOSIS — R590 Localized enlarged lymph nodes: Secondary | ICD-10-CM | POA: Diagnosis not present

## 2019-03-25 DIAGNOSIS — M5030 Other cervical disc degeneration, unspecified cervical region: Secondary | ICD-10-CM | POA: Diagnosis not present

## 2019-03-25 DIAGNOSIS — I6529 Occlusion and stenosis of unspecified carotid artery: Secondary | ICD-10-CM | POA: Diagnosis not present

## 2019-03-26 ENCOUNTER — Other Ambulatory Visit: Payer: Self-pay | Admitting: Orthopedic Surgery

## 2019-03-26 DIAGNOSIS — I6529 Occlusion and stenosis of unspecified carotid artery: Secondary | ICD-10-CM

## 2019-03-27 ENCOUNTER — Ambulatory Visit
Admission: RE | Admit: 2019-03-27 | Discharge: 2019-03-27 | Disposition: A | Discharge status: Home / Self Care | Payer: Medicare HMO | Source: Ambulatory Visit | Attending: Orthopedic Surgery | Admitting: Orthopedic Surgery

## 2019-03-27 DIAGNOSIS — I6529 Occlusion and stenosis of unspecified carotid artery: Secondary | ICD-10-CM

## 2019-03-27 DIAGNOSIS — I6523 Occlusion and stenosis of bilateral carotid arteries: Secondary | ICD-10-CM | POA: Diagnosis not present

## 2019-03-28 DIAGNOSIS — I10 Essential (primary) hypertension: Secondary | ICD-10-CM | POA: Diagnosis not present

## 2019-03-28 DIAGNOSIS — E7800 Pure hypercholesterolemia, unspecified: Secondary | ICD-10-CM | POA: Diagnosis not present

## 2019-04-16 ENCOUNTER — Other Ambulatory Visit: Payer: Self-pay

## 2019-04-16 ENCOUNTER — Ambulatory Visit: Payer: Medicare HMO | Attending: Orthopedic Surgery | Admitting: Physical Therapy

## 2019-04-16 ENCOUNTER — Encounter: Payer: Self-pay | Admitting: Physical Therapy

## 2019-04-16 DIAGNOSIS — M542 Cervicalgia: Secondary | ICD-10-CM

## 2019-04-16 DIAGNOSIS — M79602 Pain in left arm: Secondary | ICD-10-CM | POA: Insufficient documentation

## 2019-04-16 DIAGNOSIS — R29898 Other symptoms and signs involving the musculoskeletal system: Secondary | ICD-10-CM | POA: Diagnosis not present

## 2019-04-16 NOTE — Therapy (Signed)
[place] [place], [place], [address] Phone: [phone]   Fax:  [phone]  Physical Therapy Evaluation  Patient Details  Name: Joseph Moyer MRN: [id number] Date of Birth: 08/01/1940 Referring Provider (PT): Dr Arvella Merles   Encounter Date: 04/16/2019  PT End of Session - 04/16/19 1235    Visit Number  1    Number of Visits  8    Date for PT Re-Evaluation  05/14/19    Authorization Type  aetna MCR    PT Start Time  1237    PT Stop Time  1312    PT Time Calculation (min)  35 min    Activity Tolerance  Patient tolerated treatment well    Behavior During Therapy  [facility] for tasks assessed/performed       Past Medical History:  Diagnosis Date  . Allergy   . Arthritis   . Hyperlipidemia   . Hypertension     Past Surgical History:  Procedure Laterality Date  . CIRCUMCISION    . COLONOSCOPY WITH PROPOFOL N/A 05/10/2015   Procedure: COLONOSCOPY WITH PROPOFOL;  Surgeon: Garlan Fair, MD;  Location: [facility];  Service: Endoscopy;  Laterality: N/A;  . DECOMPRESSIVE LUMBAR LAMINECTOMY LEVEL 1 N/A 06/15/2018   Procedure: Gordy Levan decompression L1-L2/ complete inferior facetectomyof L1. Posterior lateral arthrodesiswith autograft bone L1-L2.;  Surgeon: Melina Schools, MD;  Location: [place];  Service: Orthopedics;  Laterality: N/A;  . HEMORRHOID SURGERY      There were no vitals filed for this visit.   Subjective Assessment - 04/16/19 1235    Subjective  Pt reports he woke up with a pain in his neck one day and had trouble turning his head to look for traffic.  This happened a couple months ago.  He is scheduled to have an injection on the 30th of this month.    Diagnostic tests  MRI    Patient Stated Goals  ease the pain and become more flexible, -    Currently in Pain?  No/denies   has pain at times, using medication to help. Needs to take it about every couple of days. Pain goes into Rt  shoulder at times        [facility] PT Assessment - 04/16/19 0001      Assessment   Medical Diagnosis  cervicalgia    Referring Provider (PT)  Dr Arvella Merles    Onset Date/Surgical Date  02/16/19    Hand Dominance  Right    Next MD Visit  injections 3/30, referring MD PRN    Prior Therapy  only for low back after surgery in the summer      Precautions   Precautions  None      Balance Screen   Has the patient fallen in the past 6 months  No    Has the patient had a decrease in activity level because of a fear of falling?   No    Is the patient reluctant to leave their home because of a fear of falling?   No      Prior Function   Level of Independence  Independent    Vocation  Retired    Leisure  golf      Observation/Other Assessments   Other Surveys   Neck Disability Index    Neck Disability Index   12/50, 24% limited      Sensation   Light Touch  Appears Intact  Hot/Cold  Appears Intact      Posture/Postural Control   Posture/Postural Control  No significant limitations      ROM / Strength   AROM / PROM / Strength  AROM;Strength      AROM   AROM Assessment Site  Shoulder;Cervical    Cervical Flexion  to chest    Cervical Extension  35 with some tightness    Cervical - Right Rotation  50 pain top of Rt shoulder     Cervical - Left Rotation  64      Strength   Strength Assessment Site  Shoulder;Elbow;Hand    Right/Left Shoulder  --   Lt WNL, Rt 5-/5   Right/Left Elbow  --   Lt 5/5, Rt 5-/5   Right/Left hand  --   Lt 60/60/57, Rt 78/78/75     Palpation   Spinal mobility  hypomobile in cervical  and thoracic spine with CPA mobs, tender with bilat UPA mobs C3-6    Palpation comment  palpable banding in the left forearm - pt wearing strap to help with this.  tight in cervical and upper shoulders however no pain.                 Objective measurements completed on examination: See above findings.      [facility] Adult PT Treatment/Exercise - 04/16/19  0001      Self-Care   Self-Care  Other Self-Care Comments    Other Self-Care Comments   Lt forearm cross friction massage, ice massage and stretching along with strap to ECR tendon that has a band proximally.       Exercises   Exercises  Neck      Neck Exercises: Seated   Neck Retraction  10 reps;3 secs    Other Seated Exercise  10 reps scapular retraction with ER    Other Seated Exercise  self mobs - cervcial with towel and thoracic over chair             PT Education - 04/16/19 1309    Education Details  HEP and POC    Person(s) Educated  Patient    Methods  Explanation;Demonstration;Handout    Comprehension  Verbalized understanding;Returned demonstration          PT Long Term Goals - 04/16/19 1325      PT LONG TERM GOAL #1   Title  I with HEP to promote spinal mobility ( 05/14/2019)    Time  4    Period  Weeks    Status  New    Target Date  05/14/19      PT LONG TERM GOAL #2   Title  improve bilat cervical rotation =/> 70 degrees to allow ease of looking for traffic ( 05/14/2019)    Time  4    Period  Weeks    Status  New    Target Date  05/14/19      PT LONG TERM GOAL #3   Title  improve NDI =/ better than 8/15 ( 05/14/2019)    Time  4    Period  Weeks    Status  New    Target Date  05/14/19      PT LONG TERM GOAL #4   Title  report no more than 1/10 pain in his neck and Lt forearm with golf swing ( 05/14/2019)    Time  4    Period  Weeks    Status  New    Target Date  05/14/19             Plan - 04/16/19 1319    Clinical Impression Statement  79 yo male with ~ 58month onset of neck pain, mainly Rt sided.  He was seen by his ortho MD, had an MRI - does not remember the results -scheduled for an injection 04/29/2019.  No symptoms into his  Rt UE and negative cervical special tests.  Overall strength in upper body is good, slight difference in Rt UE being weaker than Lt, He is hypomobile in the cervical and thoracic spine limiting cervical ROM.  Pt  also reports pian in the left foreram. There is palpable banding in the proximal carpal extensor. He may benefit from some DN to this along with needing spinal mobs.  He should do well with a short course of PT    Personal Factors and Comorbidities  Comorbidity 2    Comorbidities  lumbar surgery, HTN, arthritis    Examination-Activity Limitations  Carry    Examination-Participation Restrictions  Community Activity    Stability/Clinical Decision Making  Stable/Uncomplicated    Clinical Decision Making  Low    Rehab Potential  Excellent    PT Frequency  2x / week    PT Duration  4 weeks    PT Treatment/Interventions  Iontophoresis 4mg /ml Dexamethasone;Taping;Patient/family education;Moist Heat;Passive range of motion;Ultrasound;Traction;Cryotherapy;Electrical Stimulation;Manual techniques;Dry needling;Spinal Manipulations;Joint Manipulations    PT Next Visit Plan  cervical and spinal mobs, exercise to improve mobility, possible DN to Left forearm.    Consulted and Agree with Plan of Care  Patient       Patient will benefit from skilled therapeutic intervention in order to improve the following deficits and impairments:  Pain, Decreased range of motion, Increased muscle spasms, Impaired UE functional use  Visit Diagnosis: Cervicalgia - Plan: PT plan of care cert/re-cert  Other symptoms and signs involving the musculoskeletal system - Plan: PT plan of care cert/re-cert  Pain in left arm - Plan: PT plan of care cert/re-cert     Problem List Patient Active Problem List   Diagnosis Date Noted  . 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 S/P lumbar fusion 06/15/2018  . 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 Overweight (BMI 25.0-29.9) 07/31/2014  . Acute diverticulitis 07/30/2014  . Elevated BP 07/30/2014  . Hyponatremia 07/30/2014  . Hyperlipidemia 07/30/2014  . HTN (hypertension) 07/30/2014    Boneta Lucks rPT  04/16/2019, 1:33 PM  [place] [place], [place], [address] Phone: [phone]   Fax:  [phone]  Name: DAKAI HASKILL MRN: [id number] Date of Birth: August 12, 1940

## 2019-04-16 NOTE — Patient Instructions (Signed)
Access Code: [email or web address] URL: [email or web address] Date: 04/16/2019 Prepared by: Jeral Pinch  Exercises Seated Assisted Cervical Rotation with Towel - 1 x daily - 1 sets - 10 reps - 3-5 hold Standing Cervical Retraction - 1 x daily - 1 sets - 10 reps - 3-5 hold Shoulder External Rotation and Scapular Retraction - 1 x daily - 1 sets - 10 reps - 3-5 hold Seated Thoracic Self Mobilization - 1 x daily - 1 sets - 10 reps - 3-5 hold

## 2019-04-22 ENCOUNTER — Other Ambulatory Visit: Payer: Self-pay

## 2019-04-22 ENCOUNTER — Ambulatory Visit: Payer: Medicare HMO

## 2019-04-22 DIAGNOSIS — M79602 Pain in left arm: Secondary | ICD-10-CM | POA: Diagnosis not present

## 2019-04-22 DIAGNOSIS — M542 Cervicalgia: Secondary | ICD-10-CM

## 2019-04-22 DIAGNOSIS — R29898 Other symptoms and signs involving the musculoskeletal system: Secondary | ICD-10-CM

## 2019-04-22 NOTE — Therapy (Signed)
[place], [place], [address] Phone: [phone]   Fax:  [phone]  Physical Therapy Treatment  Patient Details  Name: Joseph Moyer MRN: [id number] Date of Birth: 10/10/1940 Referring Provider (PT): Dr Arvella Merles   Encounter Date: 04/22/2019  PT End of Session - 04/22/19 1648    Visit Number  2    Number of Visits  8    Date for PT Re-Evaluation  05/14/19    Authorization Type  aetna MCR    PT Start Time  1245    PT Stop Time  1315    PT Time Calculation (min)  30 min    Activity Tolerance  Patient tolerated treatment well    Behavior During Therapy  [facility] for tasks assessed/performed       Past Medical History:  Diagnosis Date  . Allergy   . Arthritis   . Hyperlipidemia   . Hypertension     Past Surgical History:  Procedure Laterality Date  . CIRCUMCISION    . COLONOSCOPY WITH PROPOFOL N/A 05/10/2015   Procedure: COLONOSCOPY WITH PROPOFOL;  Surgeon: Garlan Fair, MD;  [facility];  Service: Endoscopy;  Laterality: N/A;  . DECOMPRESSIVE LUMBAR LAMINECTOMY LEVEL 1 N/A 06/15/2018   Procedure: Gordy Levan decompression L1-L2/ complete inferior facetectomyof L1. Posterior lateral arthrodesiswith autograft bone L1-L2.;  Surgeon: Melina Schools, MD;  Location: [place];  Service: Orthopedics;  Laterality: N/A;  . HEMORRHOID SURGERY      There were no vitals filed for this visit.  Subjective Assessment - 04/22/19 1247    Subjective  Pt reports he has been feeling a little better since his last session. He has been doing his exercises as much as he can, maybe not every day. He especially likes the towel assisted rotations.    Diagnostic tests  MRI    Patient Stated Goals  ease the pain and become more flexible, -    Currently in Pain?  No/denies         [facility] PT Assessment - 04/22/19 0001      AROM   Overall AROM   Deficits    AROM Assessment Site  Cervical    Cervical -  Right Rotation  67     Cervical - Left Rotation  64                   [facility] Adult PT Treatment/Exercise - 04/22/19 0001      Exercises   Exercises  Neck;Shoulder      Neck Exercises: Seated   Other Seated Exercise  reviewed cervical rotation SNAGS with towel      Neck Exercises: Sidelying   Other Sidelying Exercise  Book Openers for thoracic rotation 1 x 15 B      Neck Exercises: Prone   Neck Retraction  10 reps;5 secs    Neck Retraction Limitations  + scap retraction      Shoulder Exercises: Standing   External Rotation Limitations  at door + neck retraction x 10      Manual Therapy   Manual Therapy  Joint mobilization;Soft tissue mobilization;Myofascial release    Manual therapy comments  R mid cervical joint restriction with side glides     Joint Mobilization  grade II-III side glides R to L in mid-C spine, grade II-IV CPAs mid-upper thoracic spine; manual side bending B w/UT stretch R, manual rotation to end range B and gentle c/s traction  Soft tissue mobilization  STM: SO, C/S PS R    Myofascial Release  medial to L scap             PT Education - 04/22/19 1645    Education Details  Pt educated on increasing thoracic mobility to decrease load on cervical spine, as well as not sitting as much to work on posture each hour.    Person(s) Educated  Patient    Methods  Explanation;Demonstration    Comprehension  Verbalized understanding;Returned demonstration          PT Long Term Goals - 04/16/19 1325      PT LONG TERM GOAL #1   Title  I with HEP to promote spinal mobility ( 05/14/2019)    Time  4    Period  Weeks    Status  New    Target Date  05/14/19      PT LONG TERM GOAL #2   Title  improve bilat cervical rotation =/> 70 degrees to allow ease of looking for traffic ( 05/14/2019)    Time  4    Period  Weeks    Status  New    Target Date  05/14/19      PT LONG TERM GOAL #3   Title  improve NDI =/ better than 8/15 ( 05/14/2019)    Time   4    Period  Weeks    Status  New    Target Date  05/14/19      PT LONG TERM GOAL #4   Title  report no more than 1/10 pain in his neck and Lt forearm with golf swing ( 05/14/2019)    Time  4    Period  Weeks    Status  New    Target Date  05/14/19            Plan - 04/22/19 1649    Clinical Impression Statement  Pt tolerated session well with significant hypomobility of thoracic spine noted with CPAs and addition of sidelying thoracic rotation. Pt increased cervical AROM post manual therapy with no c/o discomfort. Pt demonstrates postural/periscapular weakness and decreased proprioception. He will continue to benefit from cervical/thoracic mobility and postural strengthening.    Personal Factors and Comorbidities  Comorbidity 2    Comorbidities  lumbar surgery, HTN, arthritis    Rehab Potential  Excellent    PT Frequency  2x / week    PT Duration  4 weeks    PT Treatment/Interventions  Iontophoresis 4mg /ml Dexamethasone;Taping;Patient/family education;Moist Heat;Passive range of motion;Ultrasound;Traction;Cryotherapy;Electrical Stimulation;Manual techniques;Dry needling;Spinal Manipulations;Joint Manipulations    PT Next Visit Plan  Continue to improve R cervical and L thoracic mobility, postural alignment and strengthening    PT Home Exercise Plan  Book openers, standing whatevers in doorway    Consulted and Agree with Plan of Care  Patient       Patient will benefit from skilled therapeutic intervention in order to improve the following deficits and impairments:  Pain, Decreased range of motion, Increased muscle spasms, Impaired UE functional use  Visit Diagnosis: Cervicalgia  Other symptoms and signs involving the musculoskeletal system  Pain in left arm     Problem List Patient Active Problem List   Diagnosis Date Noted  . 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 S/P lumbar fusion 06/15/2018  . 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 Overweight (BMI 25.0-29.9)  07/31/2014  . Acute diverticulitis 07/30/2014  . Elevated BP 07/30/2014  . Hyponatremia 07/30/2014  . Hyperlipidemia  07/30/2014  . HTN (hypertension) 07/30/2014    Izell Manhattan, PT, DPT 04/22/2019, 4:54 PM  [address] [place], [place], [address] Phone: [phone]   Fax:  [phone]  Name: FYNN REVES MRN: [id number] Date of Birth: 02-10-1940

## 2019-04-24 ENCOUNTER — Other Ambulatory Visit: Payer: Self-pay

## 2019-04-24 ENCOUNTER — Ambulatory Visit: Payer: Medicare HMO

## 2019-04-24 DIAGNOSIS — M79602 Pain in left arm: Secondary | ICD-10-CM

## 2019-04-24 DIAGNOSIS — R29898 Other symptoms and signs involving the musculoskeletal system: Secondary | ICD-10-CM

## 2019-04-24 DIAGNOSIS — M542 Cervicalgia: Secondary | ICD-10-CM

## 2019-04-24 NOTE — Therapy (Signed)
[place], [place], [address] Phone: [phone]   Fax:  [phone]  Physical Therapy Treatment  Patient Details  Name: Joseph Moyer MRN: [id number] Date of Birth: Nov 05, 1940 Referring Provider (PT): Dr Arvella Merles   Encounter Date: 04/24/2019  PT End of Session - 04/24/19 1502    Visit Number  3    Number of Visits  8    Date for PT Re-Evaluation  05/14/19    Authorization Type  aetna MCR    PT Start Time  1417    PT Stop Time  1501    PT Time Calculation (min)  44 min    Activity Tolerance  Patient tolerated treatment well    Behavior During Therapy  [facility] for tasks assessed/performed       Past Medical History:  Diagnosis Date  . Allergy   . Arthritis   . Hyperlipidemia   . Hypertension     Past Surgical History:  Procedure Laterality Date  . CIRCUMCISION    . COLONOSCOPY WITH PROPOFOL N/A 05/10/2015   Procedure: COLONOSCOPY WITH PROPOFOL;  Surgeon: Garlan Fair, MD;  [facility];  Service: Endoscopy;  Laterality: N/A;  . DECOMPRESSIVE LUMBAR LAMINECTOMY LEVEL 1 N/A 06/15/2018   Procedure: Gordy Levan decompression L1-L2/ complete inferior facetectomyof L1. Posterior lateral arthrodesiswith autograft bone L1-L2.;  Surgeon: Melina Schools, MD;  Location: [place];  Service: Orthopedics;  Laterality: N/A;  . HEMORRHOID SURGERY      There were no vitals filed for this visit.  Subjective Assessment - 04/24/19 1419    Subjective  Pt reports he felt good after his last session. He thinks the S/L book openers helped a lot. He wasn't able to do anything yesterday due to an upset stomach.    Diagnostic tests  MRI    Patient Stated Goals  ease the pain and become more flexible, -    Currently in Pain?  No/denies         [facility] PT Assessment - 04/24/19 0001      ROM / Strength   AROM / PROM / Strength  AROM      AROM   AROM Assessment Site  Cervical    Cervical - Right  Side Bend  15   discomfort   Cervical - Left Side Bend  25    Cervical - Right Rotation  55   improved to 62 post manual therapy   Cervical - Left Rotation  62      Palpation   Spinal mobility  hypomobility in cervical spine esp with R closing in mid-upper cervical and upper cervical/AA rotation    Palpation comment  hypertonicity R C/S PS                   [facility] Adult PT Treatment/Exercise - 04/24/19 0001      Exercises   Exercises  Neck;Shoulder      Neck Exercises: Seated   Neck Retraction  10 reps;3 secs    Neck Retraction Limitations  in prone with scap retraction in I    Cervical Rotation  Both;10 reps    Cervical Rotation Limitations  AA rotation with 3 fingers under chin      Neck Exercises: Sidelying   Other Sidelying Exercise  Book Openers 1 x 15 B      Neck Exercises: Prone   Neck Retraction  10 reps;5 secs    Neck Retraction Limitations  +  scap retraction with RTB             PT Education - 04/24/19 1501    Education Details  Continue with book openers, rotation towel SNAGS and posture focus    Person(s) Educated  Patient    Methods  Explanation;Demonstration    Comprehension  Verbalized understanding;Returned demonstration          PT Long Term Goals - 04/16/19 1325      PT LONG TERM GOAL #1   Title  I with HEP to promote spinal mobility ( 05/14/2019)    Time  4    Period  Weeks    Status  New    Target Date  05/14/19      PT LONG TERM GOAL #2   Title  improve bilat cervical rotation =/> 70 degrees to allow ease of looking for traffic ( 05/14/2019)    Time  4    Period  Weeks    Status  New    Target Date  05/14/19      PT LONG TERM GOAL #3   Title  improve NDI =/ better than 8/15 ( 05/14/2019)    Time  4    Period  Weeks    Status  New    Target Date  05/14/19      PT LONG TERM GOAL #4   Title  report no more than 1/10 pain in his neck and Lt forearm with golf swing ( 05/14/2019)    Time  4    Period  Weeks    Status   New    Target Date  05/14/19            Plan - 04/24/19 1501    Clinical Impression Statement  Well tolerated session with improved R cervical rotation following manual therapy. Pt had significant tightness with AA rotation R>L which responded well to MET in supine with max cervical flexion. He will contiue to benefit from progressions in mobility, posture, and strength.    Personal Factors and Comorbidities  Comorbidity 2    Comorbidities  lumbar surgery, HTN, arthritis    Rehab Potential  Excellent    PT Frequency  2x / week    PT Duration  4 weeks    PT Treatment/Interventions  Iontophoresis 30m/ml Dexamethasone;Taping;Patient/family education;Moist Heat;Passive range of motion;Ultrasound;Traction;Cryotherapy;Electrical Stimulation;Manual techniques;Dry needling;Spinal Manipulations;Joint Manipulations    PT Next Visit Plan  Continue to improve R cervical and L thoracic mobility, postural alignment and strengthening    PT Home Exercise Plan  Book openers, standing whatevers in doorway    Consulted and Agree with Plan of Care  Patient       Patient will benefit from skilled therapeutic intervention in order to improve the following deficits and impairments:  Pain, Decreased range of motion, Increased muscle spasms, Impaired UE functional use  Visit Diagnosis: Cervicalgia  Other symptoms and signs involving the musculoskeletal system  Pain in left arm     Problem List Patient Active Problem List   Diagnosis Date Noted  . 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 S/P lumbar fusion 06/15/2018  . 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 Overweight (BMI 25.0-29.9) 07/31/2014  . Acute diverticulitis 07/30/2014  . Elevated BP 07/30/2014  . Hyponatremia 07/30/2014  . Hyperlipidemia 07/30/2014  . HTN (hypertension) 07/30/2014    KIzell Bowers PT, DPT 04/24/2019, 3:03 PM  [place] [address]Phone:  [phone]   Fax:  [phone]  Name: Joseph Moyer MRN: [id number] Date of Birth: 1940/07/27

## 2019-04-28 ENCOUNTER — Encounter: Payer: Self-pay | Admitting: Physical Therapy

## 2019-04-28 ENCOUNTER — Other Ambulatory Visit: Payer: Self-pay

## 2019-04-28 ENCOUNTER — Ambulatory Visit: Payer: Medicare HMO | Admitting: Physical Therapy

## 2019-04-28 DIAGNOSIS — M542 Cervicalgia: Secondary | ICD-10-CM | POA: Diagnosis not present

## 2019-04-28 DIAGNOSIS — M79602 Pain in left arm: Secondary | ICD-10-CM | POA: Diagnosis not present

## 2019-04-28 DIAGNOSIS — R29898 Other symptoms and signs involving the musculoskeletal system: Secondary | ICD-10-CM

## 2019-04-28 NOTE — Therapy (Signed)
[place], [place], [address] Phone: [phone]   Fax:  [phone]  Physical Therapy Treatment  Patient Details  Name: Joseph Moyer MRN: [id number] Date of Birth: Sep 17, 1940 Referring Provider (PT): Dr Arvella Merles   Encounter Date: 04/28/2019  PT End of Session - 04/28/19 1027    Visit Number  4    Number of Visits  8    Date for PT Re-Evaluation  05/14/19    Authorization Type  aetna MCR    PT Start Time  1025   in late   PT Stop Time  1101    PT Time Calculation (min)  36 min    Activity Tolerance  Patient tolerated treatment well    Behavior During Therapy  [facility] for tasks assessed/performed       Past Medical History:  Diagnosis Date  . Allergy   . Arthritis   . Hyperlipidemia   . Hypertension     Past Surgical History:  Procedure Laterality Date  . CIRCUMCISION    . COLONOSCOPY WITH PROPOFOL N/A 05/10/2015   Procedure: COLONOSCOPY WITH PROPOFOL;  Surgeon: Garlan Fair, MD;  [facility];  Service: Endoscopy;  Laterality: N/A;  . DECOMPRESSIVE LUMBAR LAMINECTOMY LEVEL 1 N/A 06/15/2018   Procedure: Gordy Levan decompression L1-L2/ complete inferior facetectomyof L1. Posterior lateral arthrodesiswith autograft bone L1-L2.;  Surgeon: Melina Schools, MD;  Location: [place];  Service: Orthopedics;  Laterality: N/A;  . HEMORRHOID SURGERY      There were no vitals filed for this visit.  Subjective Assessment - 04/28/19 1028    Subjective  pt reports no pain right now - does have neck pain with rolling and transitioning in bed.  His forearm is also improving - not wearing the strap anymore, paritally because it was irritating skin and partially because he doesn't need it.    Patient Stated Goals  ease the pain and become more flexible, -         [facility] PT Assessment - 04/28/19 0001      AROM   Cervical - Right Rotation  70    Cervical - Left Rotation  74                    [facility] Adult PT Treatment/Exercise - 04/28/19 0001      Neck Exercises: Machines for Strengthening   UBE (Upper Arm Bike)  L3x4' alt FWD/BWD      Neck Exercises: Standing   Lift / Chop  20 reps   10# each side      Neck Exercises: Supine   Other Supine Exercise  thoracic openers over boslter with overhead reaches      Neck Exercises: Prone   Other Prone Exercise  quadruped thoracic rotations with hand behind head      Manual Therapy   Manual Therapy  Joint mobilization    Joint Mobilization  grade II-III CPA and bilat UPA cervical and thoracici mobs - pt with improved mobility from eval                   PT Long Term Goals - 04/28/19 1029      PT LONG TERM GOAL #1   Title  I with HEP to promote spinal mobility ( 05/14/2019)    Status  On-going      PT LONG TERM GOAL #2   Title  improve bilat cervical rotation =/> 70 degrees to allow  ease of looking for traffic ( 05/14/2019)    Status  Achieved      PT LONG TERM GOAL #3   Title  improve NDI =/ better than 8/15 ( 05/14/2019)    Status  On-going      PT LONG TERM GOAL #4   Title  report no more than 1/10 pain in his neck and Lt forearm with golf swing ( 05/14/2019)    Status  On-going            Plan - 04/28/19 1057    Clinical Impression Statement  PT  making good progress, he has partially met his goals.  Cervical ROM has improved and he has more mobility in his cervical and thoracic spine with mobilizations.  He reports 60-70% improvement in his symptoms.  Pt is scheduled to have his cervical injeciton tomorrow.    Rehab Potential  Excellent    PT Frequency  2x / week    PT Duration  4 weeks    PT Treatment/Interventions  Iontophoresis 25m/ml Dexamethasone;Taping;Patient/family education;Moist Heat;Passive range of motion;Ultrasound;Traction;Cryotherapy;Electrical Stimulation;Manual techniques;Dry needling;Spinal Manipulations;Joint Manipulations    PT Next Visit Plan  progress  HEP -cervical and thoracic stregthening.    Consulted and Agree with Plan of Care  Patient       Patient will benefit from skilled therapeutic intervention in order to improve the following deficits and impairments:  Pain, Decreased range of motion, Increased muscle spasms, Impaired UE functional use  Visit Diagnosis: Cervicalgia  Other symptoms and signs involving the musculoskeletal system  Pain in left arm     Problem List Patient Active Problem List   Diagnosis Date Noted  . 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 S/P lumbar fusion 06/15/2018  . 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 Overweight (BMI 25.0-29.9) 07/31/2014  . Acute diverticulitis 07/30/2014  . Elevated BP 07/30/2014  . Hyponatremia 07/30/2014  . Hyperlipidemia 07/30/2014  . HTN (hypertension) 07/30/2014    SJeral PinchPT  04/28/2019, 11:02 AM  [place] [facility] [address]Phone: [phone]  Fax:  [phone] Name: Joseph PIPEMRN: [id number] of Birth: 106-Apr-1942

## 2019-04-29 DIAGNOSIS — M5030 Other cervical disc degeneration, unspecified cervical region: Secondary | ICD-10-CM | POA: Diagnosis not present

## 2019-04-30 ENCOUNTER — Ambulatory Visit: Payer: Medicare HMO | Admitting: Physical Therapy

## 2019-04-30 ENCOUNTER — Other Ambulatory Visit: Payer: Self-pay

## 2019-04-30 DIAGNOSIS — R29898 Other symptoms and signs involving the musculoskeletal system: Secondary | ICD-10-CM

## 2019-04-30 DIAGNOSIS — M542 Cervicalgia: Secondary | ICD-10-CM | POA: Diagnosis not present

## 2019-04-30 DIAGNOSIS — M79602 Pain in left arm: Secondary | ICD-10-CM | POA: Diagnosis not present

## 2019-04-30 NOTE — Therapy (Signed)
Joseph Moyer [place], [place], [address] Phone: [phone]   Fax:  [phone]  Physical Therapy Treatment  Patient Details  Name: Joseph Moyer MRN: [id number] Date of Birth: 15-May-1940 Referring Provider (PT): Dr Joseph Moyer   Encounter Date: 04/30/2019  PT End of Session - 04/30/19 1230    Visit Number  5    Number of Visits  8    Date for PT Re-Evaluation  05/14/19    Authorization Type  aetna MCR    PT Start Time  1230    PT Stop Time  1309    PT Time Calculation (min)  39 min    Activity Tolerance  Patient tolerated treatment well    Behavior During Therapy  [facility] for tasks assessed/performed       Past Medical History:  Diagnosis Date  . Allergy   . Arthritis   . Hyperlipidemia   . Hypertension     Past Surgical History:  Procedure Laterality Date  . CIRCUMCISION    . COLONOSCOPY WITH PROPOFOL N/A 05/10/2015   Procedure: COLONOSCOPY WITH PROPOFOL;  Surgeon: Joseph Fair, MD;  [facility];  Service: Endoscopy;  Laterality: N/A;  . DECOMPRESSIVE LUMBAR LAMINECTOMY LEVEL 1 N/A 06/15/2018   Procedure: Joseph Moyer decompression L1-L2/ complete inferior facetectomyof L1. Posterior lateral arthrodesiswith autograft bone L1-L2.;  Surgeon: Joseph Schools, MD;  Location: [place];  Service: Orthopedics;  Laterality: N/A;  . HEMORRHOID SURGERY      There were no vitals filed for this visit.      [facility] PT Assessment - 04/30/19 0001      Assessment   Medical Diagnosis  cervicalgia    Referring Provider (PT)  Dr Joseph Moyer      Observation/Other Assessments   Neck Disability Index   5/50, 10%                   [facility] Adult PT Treatment/Exercise - 04/30/19 0001      Neck Exercises: Standing   Other Standing Exercises  simupated golf swing with green band      Neck Exercises: Supine   Other Supine Exercise  2x10, green band hookllying overhead pull, horizontal  abduction and ER     Other Supine Exercise  2x10 SASH each side with red band      Neck Exercises: Sidelying   Other Sidelying Exercise  2x10 book openers with green band      Neck Exercises: Stretches   Other Neck Stretches  doorway stretch, mid and high , then supine shoulder stretches, goal post to scarecrows and overhead flow             PT Education - 04/30/19 1244    Education Details  HEP progression    Person(s) Educated  Patient    Methods  Explanation;Demonstration;Handout    Comprehension  Returned demonstration;Verbalized understanding          PT Long Term Goals - 04/30/19 1251      PT LONG TERM GOAL #1   Title  I with HEP to promote spinal mobility ( 05/14/2019)    Status  On-going      PT LONG TERM GOAL #2   Title  improve bilat cervical rotation =/> 70 degrees to allow ease of looking for traffic ( 05/14/2019)    Status  Achieved      PT LONG TERM GOAL #3   Title  improve NDI =/ better than  8/50 ( 05/14/2019)    Baseline  5/50, 10%    Status  Achieved      PT LONG TERM GOAL #4   Title  report no more than 1/10 pain in his neck and Lt forearm with golf swing ( 05/14/2019)    Status  On-going            Plan - 04/30/19 1302    Clinical Impression Statement  Joseph Moyer is making good progress with PT, he reports reduced pain and increased mobility, Goals are partially met.  He demo'd new HEP in the clinic today.  Will assess tolerance to this next visit.    Rehab Potential  Excellent    PT Frequency  2x / week    PT Duration  4 weeks    PT Treatment/Interventions  Iontophoresis 49m/ml Dexamethasone;Taping;Patient/family education;Moist Heat;Passive range of motion;Ultrasound;Traction;Cryotherapy;Electrical Stimulation;Manual techniques;Dry needling;Spinal Manipulations;Joint Manipulations    PT Next Visit Plan  assess tolerance to new HEP and prepare for discharge       Patient will benefit from skilled therapeutic intervention in order to improve  the following deficits and impairments:  Pain, Decreased range of motion, Increased muscle spasms, Impaired UE functional use  Visit Diagnosis: Cervicalgia  Other symptoms and signs involving the musculoskeletal system  Pain in left arm     Problem List Patient Active Problem List   Diagnosis Date Noted  . 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 S/P lumbar fusion 06/15/2018  . 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 Overweight (BMI 25.0-29.9) 07/31/2014  . Acute diverticulitis 07/30/2014  . Elevated BP 07/30/2014  . Hyponatremia 07/30/2014  . Hyperlipidemia 07/30/2014  . HTN (hypertension) 07/30/2014    SJeral PinchPT  04/30/2019, 1:09 PM  [place] [address]Phone: [phone]  Fax:  [phone] Name: Joseph MARTYMRN: [id number] of Birth: 1Mar 09, 1942

## 2019-04-30 NOTE — Patient Instructions (Signed)
Access Code: [email or web address] URL: [email or web address] Date: 04/30/2019 Prepared by: Jeral Pinch  Exercises Supine Shoulder Flexion Extension AAROM with [place] - 1 x daily - 2-3 sets - 10 reps Supine Shoulder Horizontal Abduction with Resistance - 1 x daily - 2-3 sets - 10 reps Supine Shoulder External Rotation with Resistance - 1 x daily - 2-3 sets - 10 reps Sidelying Thoracic Rotation with Open Book - 1 x daily - 2-3 sets - 10 reps

## 2019-05-01 DIAGNOSIS — Z791 Long term (current) use of non-steroidal anti-inflammatories (NSAID): Secondary | ICD-10-CM | POA: Diagnosis not present

## 2019-05-01 DIAGNOSIS — K219 Gastro-esophageal reflux disease without esophagitis: Secondary | ICD-10-CM | POA: Diagnosis not present

## 2019-05-01 DIAGNOSIS — Z87891 Personal history of nicotine dependence: Secondary | ICD-10-CM | POA: Diagnosis not present

## 2019-05-01 DIAGNOSIS — Z833 Family history of diabetes mellitus: Secondary | ICD-10-CM | POA: Diagnosis not present

## 2019-05-01 DIAGNOSIS — E785 Hyperlipidemia, unspecified: Secondary | ICD-10-CM | POA: Diagnosis not present

## 2019-05-01 DIAGNOSIS — M1990 Unspecified osteoarthritis, unspecified site: Secondary | ICD-10-CM | POA: Diagnosis not present

## 2019-05-01 DIAGNOSIS — I10 Essential (primary) hypertension: Secondary | ICD-10-CM | POA: Diagnosis not present

## 2019-05-01 DIAGNOSIS — Z7982 Long term (current) use of aspirin: Secondary | ICD-10-CM | POA: Diagnosis not present

## 2019-05-05 ENCOUNTER — Ambulatory Visit: Payer: Medicare HMO | Attending: Orthopedic Surgery | Admitting: Physical Therapy

## 2019-05-05 ENCOUNTER — Encounter: Payer: Self-pay | Admitting: Physical Therapy

## 2019-05-05 ENCOUNTER — Other Ambulatory Visit: Payer: Self-pay

## 2019-05-05 DIAGNOSIS — R29898 Other symptoms and signs involving the musculoskeletal system: Secondary | ICD-10-CM

## 2019-05-05 DIAGNOSIS — M542 Cervicalgia: Secondary | ICD-10-CM | POA: Insufficient documentation

## 2019-05-05 NOTE — Therapy (Signed)
[place], [place], [address] Phone: [phone]   Fax:  [phone]  Physical Therapy Treatment  Patient Details  Name: Joseph Moyer MRN: [id number] Date of Birth: [birth year] Referring Provider (PT): Dr Arvella Merles   Encounter Date: 05/05/2019  PT End of Session - 05/05/19 1348    Visit Number  6    Date for PT Re-Evaluation  05/14/19    Authorization Type  aetna MCR    PT Start Time  1308    PT Stop Time  1348    PT Time Calculation (min)  40 min    Activity Tolerance  Patient tolerated treatment well    Behavior During Therapy  [facility] for tasks assessed/performed       Past Medical History:  Diagnosis Date  . Allergy   . Arthritis   . Hyperlipidemia   . Hypertension     Past Surgical History:  Procedure Laterality Date  . CIRCUMCISION    . COLONOSCOPY WITH PROPOFOL N/A 05/10/2015   Procedure: COLONOSCOPY WITH PROPOFOL;  Surgeon: Garlan Fair, MD;  [facility];  Service: Endoscopy;  Laterality: N/A;  . DECOMPRESSIVE LUMBAR LAMINECTOMY LEVEL 1 N/A 06/15/2018   Procedure: Gordy Levan decompression L1-L2/ complete inferior facetectomyof L1. Posterior lateral arthrodesiswith autograft bone L1-L2.;  Surgeon: Melina Schools, MD;  Location: [place];  Service: Orthopedics;  Laterality: N/A;  . HEMORRHOID SURGERY      There were no vitals filed for this visit.  Subjective Assessment - 05/05/19 1312    Subjective  "doing well, no pain"    Currently in Pain?  No/denies                       [facility] Adult PT Treatment/Exercise - 05/05/19 0001      Neck Exercises: Machines for Strengthening   UBE (Upper Arm Bike)  L5x4' alt FWD/BWD    Cybex Row  20# 2x10    Lat Pull  20# 2x10      Neck Exercises: Theraband   Shoulder Extension  Green;20 reps    Rows  20 reps;Green    Shoulder External Rotation  20 reps;Red      Neck Exercises: Standing   Other Standing  Exercises  simulated golf swings with cane and 1# weight      Shoulder Exercises: Supine   Other Supine Exercises  supine on bolster I's, Y's and T's    Other Supine Exercises  isometric abdominal contraction with ball      Shoulder Exercises: Seated   Other Seated Exercises  trunk rotation stretch      Shoulder Exercises: Standing   Other Standing Exercises  back to wall weighted ball overhead lift    Other Standing Exercises  doorway stretch       W backs back to wall            PT Long Term Goals - 05/05/19 1352      PT LONG TERM GOAL #1   Title  I with HEP to promote spinal mobility ( 05/14/2019)    Status  Partially Met            Plan - 05/05/19 1348    Clinical Impression Statement  Patient is doing well with his HEP, he has not had any increase of pain.  He remains tight anteriorly, gets a good stretch with the door, fights some with W backs, the W backs and  the over head ball lift were difficult for him.    PT Next Visit Plan  continue to progress work on scapular stability and anterior stretches    Consulted and Agree with Plan of Care  Patient       Patient will benefit from skilled therapeutic intervention in order to improve the following deficits and impairments:  Pain, Decreased range of motion, Increased muscle spasms, Impaired UE functional use  Visit Diagnosis: Cervicalgia  Other symptoms and signs involving the musculoskeletal system     Problem List Patient Active Problem List   Diagnosis Date Noted  . Myelopathy ([place]) 06/17/2018  . S/P lumbar fusion 06/15/2018  . 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 Overweight (BMI 25.0-29.9) 07/31/2014  . Acute diverticulitis 07/30/2014  . Elevated BP 07/30/2014  . Hyponatremia 07/30/2014  . Hyperlipidemia 07/30/2014  . HTN (hypertension) 07/30/2014    Sumner Boast., PT 05/05/2019, 1:53 PM  [place]  [address] [place], [place], [address] Phone: [phone]   Fax:  [phone]  Name: Joseph Moyer MRN: [id number] Date of Birth: 07/30/1940

## 2019-05-07 ENCOUNTER — Ambulatory Visit: Payer: Medicare HMO | Admitting: Physical Therapy

## 2019-05-07 ENCOUNTER — Encounter: Payer: Self-pay | Admitting: Physical Therapy

## 2019-05-07 ENCOUNTER — Other Ambulatory Visit: Payer: Self-pay

## 2019-05-07 DIAGNOSIS — R29898 Other symptoms and signs involving the musculoskeletal system: Secondary | ICD-10-CM | POA: Diagnosis not present

## 2019-05-07 DIAGNOSIS — M542 Cervicalgia: Secondary | ICD-10-CM

## 2019-05-07 NOTE — Therapy (Signed)
[place], [place], [address] Phone: [phone]   Fax:  [phone]  Physical Therapy Treatment  Patient Details  Name: LAJUAN KOVALESKI MRN: [id number] Date of Birth: 10-12-1940 Referring Provider (PT): Dr Arvella Merles   Encounter Date: 05/07/2019  PT End of Session - 05/07/19 1350    Visit Number  7    PT Start Time  1310    PT Stop Time  1352    PT Time Calculation (min)  42 min    Activity Tolerance  Patient tolerated treatment well    Behavior During Therapy  [facility] for tasks assessed/performed       Past Medical History:  Diagnosis Date  . Allergy   . Arthritis   . Hyperlipidemia   . Hypertension     Past Surgical History:  Procedure Laterality Date  . CIRCUMCISION    . COLONOSCOPY WITH PROPOFOL N/A 05/10/2015   Procedure: COLONOSCOPY WITH PROPOFOL;  Surgeon: Garlan Fair, MD;  [facility];  Service: Endoscopy;  Laterality: N/A;  . DECOMPRESSIVE LUMBAR LAMINECTOMY LEVEL 1 N/A 06/15/2018   Procedure: Gordy Levan decompression L1-L2/ complete inferior facetectomyof L1. Posterior lateral arthrodesiswith autograft bone L1-L2.;  Surgeon: Melina Schools, MD;  Location: [place];  Service: Orthopedics;  Laterality: N/A;  . HEMORRHOID SURGERY      There were no vitals filed for this visit.  Subjective Assessment - 05/07/19 1322    Subjective  "a little mm soreness"    Currently in Pain?  No/denies                       [facility] Adult PT Treatment/Exercise - 05/07/19 0001      Neck Exercises: Machines for Strengthening   UBE (Upper Arm Bike)  L5x4' alt FWD/BWD    Cybex Row  25# 2x10    Lat Pull  25# 2x10    Other Machines for Strengthening  10# straight arm pulls with verbal and tactile cues for posture    Other Machines for Strengthening  5# cross pulls from high and from low, 5# at po degrees ER      Neck Exercises: Theraband   Shoulder Extension  20 reps;Blue     Rows  20 reps;Blue    Shoulder External Rotation  20 reps;Green      Shoulder Exercises: Seated   Other Seated Exercises  5# bent over row, 3# extension, 2# reverse flies      Shoulder Exercises: Standing   Other Standing Exercises  back to wall weighted ball overhead lift, W backs    Other Standing Exercises  doorway stretch                  PT Long Term Goals - 05/07/19 1353      PT LONG TERM GOAL #1   Title  I with HEP to promote spinal mobility ( 05/14/2019)    Status  Achieved      PT LONG TERM GOAL #2   Title  improve bilat cervical rotation =/> 70 degrees to allow ease of looking for traffic ( 05/14/2019)    Status  Achieved      PT LONG TERM GOAL #3   Title  improve NDI =/ better than 8/50 ( 05/14/2019)    Status  Achieved      PT LONG TERM GOAL #4   Title  report no more than 1/10 pain in his neck and  Lt forearm with golf swing ( 05/14/2019)    Status  Achieved            Plan - 05/07/19 1350    Clinical Impression Statement  I went over a lot of HEP and he did need some cues verbal and tactile for posture and form.  He feels that he understand the posture and the effects that it has on the neck and shoulders.    PT Next Visit Plan  D/C goals met    Consulted and Agree with Plan of Care  Patient       Patient will benefit from skilled therapeutic intervention in order to improve the following deficits and impairments:  Pain, Decreased range of motion, Increased muscle spasms, Impaired UE functional use  Visit Diagnosis: Cervicalgia  Other symptoms and signs involving the musculoskeletal system     Problem List Patient Active Problem List   Diagnosis Date Noted  . 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 S/P lumbar fusion 06/15/2018  . 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 Overweight (BMI 25.0-29.9) 07/31/2014  . Acute diverticulitis 07/30/2014  . Elevated BP 07/30/2014  . Hyponatremia 07/30/2014  . Hyperlipidemia 07/30/2014  . HTN  (hypertension) 07/30/2014    Sumner Boast., PT 05/07/2019, 1:54 PM  [place] [address] [place], [place], [address] Phone: [phone]   Fax:  [phone]  Name: IZAIA SAY MRN: [id number] Date of Birth: 1940-08-02

## 2019-06-06 DIAGNOSIS — M65332 Trigger finger, left middle finger: Secondary | ICD-10-CM | POA: Diagnosis not present

## 2019-06-16 DIAGNOSIS — E7800 Pure hypercholesterolemia, unspecified: Secondary | ICD-10-CM | POA: Diagnosis not present

## 2019-06-16 DIAGNOSIS — I10 Essential (primary) hypertension: Secondary | ICD-10-CM | POA: Diagnosis not present

## 2019-06-16 DIAGNOSIS — Z1389 Encounter for screening for other disorder: Secondary | ICD-10-CM | POA: Diagnosis not present

## 2019-06-16 DIAGNOSIS — Z0000 Encounter for general adult medical examination without abnormal findings: Secondary | ICD-10-CM | POA: Diagnosis not present

## 2019-09-30 DIAGNOSIS — E7800 Pure hypercholesterolemia, unspecified: Secondary | ICD-10-CM | POA: Diagnosis not present

## 2019-09-30 DIAGNOSIS — I10 Essential (primary) hypertension: Secondary | ICD-10-CM | POA: Diagnosis not present

## 2019-09-30 DIAGNOSIS — G4700 Insomnia, unspecified: Secondary | ICD-10-CM | POA: Diagnosis not present

## 2019-11-14 DIAGNOSIS — E7800 Pure hypercholesterolemia, unspecified: Secondary | ICD-10-CM | POA: Diagnosis not present

## 2019-11-14 DIAGNOSIS — R0609 Other forms of dyspnea: Secondary | ICD-10-CM | POA: Diagnosis not present

## 2019-11-19 ENCOUNTER — Telehealth: Payer: Self-pay

## 2019-11-19 NOTE — Telephone Encounter (Signed)
NOTES ON FILE FROM DR [name] [phone], SENT REFERRAL TO SCHEDULING

## 2019-12-01 ENCOUNTER — Encounter: Payer: Self-pay | Admitting: General Practice

## 2019-12-18 DIAGNOSIS — E7800 Pure hypercholesterolemia, unspecified: Secondary | ICD-10-CM | POA: Diagnosis not present

## 2019-12-18 DIAGNOSIS — I10 Essential (primary) hypertension: Secondary | ICD-10-CM | POA: Diagnosis not present

## 2019-12-21 NOTE — Progress Notes (Signed)
Cardiology Office Note:    Date:  12/23/2019   ID:  [name], DOB 1940-08-31, MRN [id number]  PCP:  [name], MD  [facility] Cardiologist:  No primary care provider on file.  [facility] Electrophysiologist:  None   Referring MD: [name], MD     History of Present Illness:    Joseph Moyer is a 79 y.o. 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 Delfina Redwood for evaluation of DOE.  Primary care physician's note dated 11/14/19 reviewed. The patient has been having exertional shortness of breath that he has noticed mainly when cutting the grass. He has had to stop working in order to catch his breath. No associated chest pain, diaphoresis, nausea or vomiting. He also has similar symptoms when climbing the stairs. The patient's risk factors HTN, HLD, and remote history of tobacco use. Reportedly had prior cath in 2006 with no significant disease.   Today the patient states that he first noted shortness of breath when mowing the lawn requiring him to take breaks 3-4 times. Symptoms started in July when it was hotter outside. Feels like his symptoms have improved somewhat with the colder weather and he is taking less breaks but still not able to exert himself life he used to.  No associated chest pain/pressure, diaphoresis, lightheadedness or dizziness.   Patient also notes some LE pain mainly in the calves and thighs when he lays down at night. Not exertional. No LE wounds. Gets better with the massage gun.  Has some intermittent chest/arm pain when laying down at night. This is occasional and not associated with exertion.  In PCP's office. Trop T 9, BNP 13,  ECG without ischemia.  Past Medical History:  Diagnosis Date  . Allergy   . Arthritis   . Hyperlipidemia   . Hypertension     Past Surgical History:  Procedure Laterality Date  . CIRCUMCISION    . COLONOSCOPY WITH PROPOFOL N/A 05/10/2015   Procedure:  COLONOSCOPY WITH PROPOFOL;  Surgeon: Garlan Fair, MD;  Location: [facility];  Service: Endoscopy;  Laterality: N/A;  . DECOMPRESSIVE LUMBAR LAMINECTOMY LEVEL 1 N/A 06/15/2018   Procedure: Gordy Levan decompression L1-L2/ complete inferior facetectomyof L1. Posterior lateral arthrodesiswith autograft bone L1-L2.;  Surgeon: Melina Schools, MD;  Location: [place];  Service: Orthopedics;  Laterality: N/A;  . HEMORRHOID SURGERY      Current Medications: Current Meds  Medication Sig  . amLODipine (NORVASC) 5 MG tablet Take 5 mg by mouth daily.  Marland Kitchen aspirin 81 MG tablet Take 81 mg by mouth every morning.   . Naproxen Sodium (ALEVE PO) Take 1 tablet by mouth daily as needed (for pain).  . [DISCONTINUED] simvastatin (ZOCOR) 20 MG tablet Take 20 mg by mouth every evening.      Allergies:   Oxycodone   Social History   Socioeconomic History  . Marital status: Married    Spouse name: Not on file  . Number of children: Not on file  . Years of education: Not on file  . Highest education level: Not on file  Occupational History  . Not on file  Tobacco Use  . Smoking status: Former Smoker    Quit date: 1994    Years since quitting: 27.9  . Smokeless tobacco: Never Used  Substance and Sexual Activity  . Alcohol use: Yes    Alcohol/week: 0.0 standard drinks    Comment: occassionally  . Drug use: No  .  Sexual activity: Not on file  Other Topics Concern  . Not on file  Social History Narrative  . Not on file   Social Determinants of Health   Financial Resource Strain:   . Difficulty of Paying Living Expenses: Not on file  Food Insecurity:   . Worried About Charity fundraiser in the Last Year: Not on file  . Ran Out of Food in the Last Year: Not on file  Transportation Needs:   . Lack of Transportation (Medical): Not on file  . Lack of Transportation (Non-Medical): Not on file  Physical Activity:   . Days of Exercise per Week: Not on file  . Minutes of Exercise per Session: Not on file    Stress:   . Feeling of Stress : Not on file  Social Connections:   . Frequency of Communication with Friends and Family: Not on file  . Frequency of Social Gatherings with Friends and Family: Not on file  . Attends Religious Services: Not on file  . Active Member of Clubs or Organizations: Not on file  . Attends Archivist Meetings: Not on file  . Marital Status: Not on file     Family History: The patient's family history includes Diabetes in his brother, mother, and sister.  ROS:   Please see the history of present illness.    Review of Systems  Constitutional: Negative for chills and fever.  HENT: Negative for congestion.   Eyes: Negative for blurred vision.  Respiratory: Positive for shortness of breath.   Cardiovascular: Positive for chest pain. Negative for palpitations, orthopnea, claudication, leg swelling and PND.  Gastrointestinal: Negative for nausea and vomiting.  Genitourinary: Negative for hematuria.  Musculoskeletal: Negative for falls.  Neurological: Negative for dizziness and loss of consciousness.  Endo/Heme/Allergies: Negative for polydipsia.  Psychiatric/Behavioral: Negative for substance abuse.    EKGs/Labs/Other Studies Reviewed:    The following studies were reviewed today: TTE 04/07/16: Study Conclusions  - Left ventricle: The cavity size was normal. There was moderate  concentric hypertrophy. Systolic function was vigorous. The  estimated ejection fraction was in the range of 65% to 70%. Wall  motion was normal; there were no regional wall motion  abnormalities. Doppler parameters are consistent with abnormal  left ventricular relaxation (grade 1 diastolic dysfunction).  Doppler parameters are consistent with elevated ventricular  end-diastolic filling pressure.  - Aortic valve: There was no regurgitation.  - Aortic root: The aortic root was normal in size.  - Mitral valve: There was trivial regurgitation.  - Left atrium:  The atrium was normal in size.  - Right ventricle: Systolic function was normal.  - Tricuspid valve: There was mild regurgitation.  - Pulmonary arteries: Systolic pressure was within the normal  range.  - Inferior vena cava: The vessel was normal in size.  - Pericardium, extracardiac: There was no pericardial effusion.   Carotid U/S 03/27/19: IMPRESSION: Minor carotid atherosclerosis. No hemodynamically significant ICA stenosis. Degree of narrowing less than 50% bilaterally by ultrasound criteria.  Patent antegrade vertebral flow bilaterally  Mildly enlarged cervical lymph nodes bilaterally, favored to be reactive. Suspect chronic.  Left paramidline neck soft tissue minimally complex cystic structure remains indeterminate, measuring 2.4 cm in AP diameter, remotely described in April 07, 2000 on a thyroid ultrasound report. This is favored to be benign.  EKG:  EKG is  ordered today.  The ekg ordered today demonstrates NSR with HR 76  Recent Labs: No results found for requested labs within last 8760 hours.  Recent Lipid Panel No results found for: CHOL, TRIG, HDL, CHOLHDL, VLDL, LDLCALC, LDLDIRECT    Physical Exam:    VS:  BP 140/82   Pulse 76   Ht '5\' 11"'  (1.803 m)   Wt 217 lb (98.4 kg)   SpO2 97%   BMI 30.27 kg/m     Wt Readings from Last 3 Encounters:  12/23/19 217 lb (98.4 kg)  06/26/18 190 lb 2 oz (86.2 kg)  06/15/18 220 lb 7.4 oz (100 kg)     GEN:  Well nourished, well developed in no acute distress HEENT: Normal NECK: No JVD; No carotid bruits CARDIAC: RRR, 2/6 harsh systolic murmur best heard at RUSB, no rubs, gallops RESPIRATORY:  Clear to auscultation without rales, wheezing or rhonchi  ABDOMEN: Soft, non-tender, non-distended MUSCULOSKELETAL:  No edema; No deformity SKIN: Warm and dry NEUROLOGIC:  Alert and oriented x 3 PSYCHIATRIC:  Normal affect   ASSESSMENT:    1. DOE (dyspnea on exertion)   2. Chest pain of uncertain etiology   3. Systolic murmur    4. Primary hypertension   5. Pure hypercholesterolemia    PLAN:    In order of problems listed above:  #DOE: Patient with worsening DOE noted while mowing the grass and walking up incline. Concern for anginal equivalent. Risk factors include HTN, HLD and age. No known CAD. Last TTE in 2018 with normal EF, moderate LVH, G1DD, no significant valve disease. -Check coronary CTA -Check TTE -Continue ASA 72m daily -Change simvastatin to crestor 250mdaily -If BP elevated at home, will add ACE/ARB  #Systolic Murmur: -TTE as above  #HTN: Elevated today.  -Check blood pressures at home and if >120s/80s, will add ACE/ARB -Continue amlodipine 76m62maily  #LE Pain: Mainly occurs at night when laying down. No exertional symptoms or wounds. Extremities warm with palpable PTs; dimished Dps. Do not suspect significant PAD, however, if symptoms persist or become exertional, will check ABIs. -If symptoms persist or become exertional, will check ABIs -Continue ASA and statin as above  #HLD: LDL 82, HDL 51, TG 206, TC 167 -Change simvastatin to crestor 17m47mily   Medication Adjustments/Labs and Tests Ordered: Current medicines are reviewed at length with the patient today.  Concerns regarding medicines are outlined above.  Orders Placed This Encounter  Procedures  . CT CORONARY MORPH W/CTA COR W/SCORE W/CA W/CM &/OR WO/CM  . CT CORONARY FRACTIONAL FLOW RESERVE DATA PREP  . CT CORONARY FRACTIONAL FLOW RESERVE FLUID ANALYSIS  . Basic metabolic panel  . EKG 12-Lead  . ECHOCARDIOGRAM COMPLETE   Meds ordered this encounter  Medications  . rosuvastatin (CRESTOR) 20 MG tablet    Sig: Take 1 tablet (20 mg total) by mouth daily.    Dispense:  90 tablet    Refill:  1  . metoprolol tartrate (LOPRESSOR) 50 MG tablet    Sig: Take 1 tablet (50 mg total) by mouth once for 1 dose. Take 2 hours prior to your Coronary CT.    Dispense:  1 tablet    Refill:  0    Patient Instructions  Medication  Instructions:   STOP TAKING SIMVASTATIN NOW  START TAKING ROSUVASTATIN (CRESTOR) 20 MG BY MOUTH DAILY  *If you need a refill on your cardiac medications before your next appointment, please call your pharmacy*   Testing/Procedures:  Your physician has requested that you have an echocardiogram. Echocardiography is a painless test that uses sound waves to create images of your heart. It provides your doctor with  information about the size and shape of your heart and how well your heart's chambers and valves are working. This procedure takes approximately one hour. There are no restrictions for this procedure.   Your cardiac CT will be scheduled at one of the below locations:   [facility] [address] [place], [place] [address] [phone]  If scheduled at [facility], please arrive at the [facility] main entrance of [facility] 30 minutes prior to test start time. Proceed to the [facility] Radiology Department (first floor) to check-in and test prep.  Please follow these instructions carefully (unless otherwise directed):  On the Night Before the Test: . Be sure to Drink plenty of water. . Do not consume any caffeinated/decaffeinated beverages or chocolate 12 hours prior to your test. . Do not take any antihistamines 12 hours prior to your test.  On the Day of the Test: . Drink plenty of water. Do not drink any water within one hour of the test. . Do not eat any food 4 hours prior to the test. . You may take your regular medications prior to the test.  . Take metoprolol (Lopressor) 50 mg by mouth two hours prior to test.      After the Test: . Drink plenty of water. . After receiving IV contrast, you may experience a mild flushed feeling. This is normal. . On occasion, you may experience a mild rash up to 24 hours after the test. This is not dangerous. If this occurs, you can take Benadryl 25 mg and increase your fluid intake. . If you  experience trouble breathing, this can be serious. If it is severe call 911 IMMEDIATELY. If it is mild, please call our office.  Once we have confirmed authorization from your insurance company, we will call you to set up a date and time for your test. Based on how quickly your insurance processes prior authorizations requests, please allow up to 4 weeks to be contacted for scheduling your Cardiac CT appointment. Be advised that routine Cardiac CT appointments could be scheduled as many as 8 weeks after your provider has ordered it.  For non-scheduling related questions, please contact the cardiac imaging nurse navigator should you have any questions/concerns: Marchia Bond, Cardiac Imaging Nurse Navigator Burley Saver, Interim Cardiac Imaging Nurse [place] [facility] Direct Office Dial: [phone]   For scheduling needs, including cancellations and rescheduling, please call [place], [phone] (temporary number).    Follow-Up:  2 MONTHS IN THE OFFICE WITH DR. Johney Frame   Other Instructions  --PLEASE CONTINUE MONITORING YOUR BLOOD PRESSURE AT HOME--PLEASE LET us KNOW IF YOUR BLOOD PRESSURE IS GREATER THAN 120/80--     Signed, Freada Bergeron, MD  12/23/2019 4:21 PM    [facility]

## 2019-12-23 ENCOUNTER — Encounter: Payer: Self-pay | Admitting: Cardiology

## 2019-12-23 ENCOUNTER — Other Ambulatory Visit: Payer: Self-pay

## 2019-12-23 ENCOUNTER — Ambulatory Visit: Payer: Medicare HMO | Admitting: Cardiology

## 2019-12-23 VITALS — BP 140/82 | HR 76 | Ht 71.0 in | Wt 217.0 lb

## 2019-12-23 DIAGNOSIS — R011 Cardiac murmur, unspecified: Secondary | ICD-10-CM

## 2019-12-23 DIAGNOSIS — I10 Essential (primary) hypertension: Secondary | ICD-10-CM | POA: Diagnosis not present

## 2019-12-23 DIAGNOSIS — R0600 Dyspnea, unspecified: Secondary | ICD-10-CM

## 2019-12-23 DIAGNOSIS — R079 Chest pain, unspecified: Secondary | ICD-10-CM | POA: Diagnosis not present

## 2019-12-23 DIAGNOSIS — E7800 Pure hypercholesterolemia, unspecified: Secondary | ICD-10-CM

## 2019-12-23 DIAGNOSIS — R0609 Other forms of dyspnea: Secondary | ICD-10-CM

## 2019-12-23 MED ORDER — METOPROLOL TARTRATE 50 MG PO TABS: 50.0000 mg | ORAL_TABLET | Freq: Once | ORAL | 0 refills | Status: DC

## 2019-12-23 MED ORDER — ROSUVASTATIN CALCIUM 20 MG PO TABS: 20.0000 mg | ORAL_TABLET | Freq: Every day | ORAL | 1 refills | Status: DC

## 2019-12-23 NOTE — Patient Instructions (Signed)
Medication Instructions:   STOP TAKING SIMVASTATIN NOW  START TAKING ROSUVASTATIN (CRESTOR) 20 MG BY MOUTH DAILY  *If you need a refill on your cardiac medications before your next appointment, please call your pharmacy*   Testing/Procedures:  Your physician has requested that you have an echocardiogram. Echocardiography is a painless test that uses sound waves to create images of your heart. It provides your doctor with information about the size and shape of your heart and how well your heart's chambers and valves are working. This procedure takes approximately one hour. There are no restrictions for this procedure.   Your cardiac CT will be scheduled at one of the below locations:   [facility] [address] [place], [place] [address] [phone]  If scheduled at [facility], please arrive at the [facility] main entrance of [facility] 30 minutes prior to test start time. Proceed to the [facility] Radiology Department (first floor) to check-in and test prep.  Please follow these instructions carefully (unless otherwise directed):  On the Night Before the Test: . Be sure to Drink plenty of water. . Do not consume any caffeinated/decaffeinated beverages or chocolate 12 hours prior to your test. . Do not take any antihistamines 12 hours prior to your test.  On the Day of the Test: . Drink plenty of water. Do not drink any water within one hour of the test. . Do not eat any food 4 hours prior to the test. . You may take your regular medications prior to the test.  . Take metoprolol (Lopressor) 50 mg by mouth two hours prior to test.      After the Test: . Drink plenty of water. . After receiving IV contrast, you may experience a mild flushed feeling. This is normal. . On occasion, you may experience a mild rash up to 24 hours after the test. This is not dangerous. If this occurs, you can take Benadryl 25 mg and increase your fluid intake. . If  you experience trouble breathing, this can be serious. If it is severe call 911 IMMEDIATELY. If it is mild, please call our office.  Once we have confirmed authorization from your insurance company, we will call you to set up a date and time for your test. Based on how quickly your insurance processes prior authorizations requests, please allow up to 4 weeks to be contacted for scheduling your Cardiac CT appointment. Be advised that routine Cardiac CT appointments could be scheduled as many as 8 weeks after your provider has ordered it.  For non-scheduling related questions, please contact the cardiac imaging nurse navigator should you have any questions/concerns: Marchia Bond, Cardiac Imaging Nurse Navigator Burley Saver, Interim Cardiac Imaging Nurse [place] [facility] Direct Office Dial: [phone]   For scheduling needs, including cancellations and rescheduling, please call [place], [phone] (temporary number).    Follow-Up:  2 MONTHS IN THE OFFICE WITH DR. Johney Frame   Other Instructions  --PLEASE CONTINUE MONITORING YOUR BLOOD PRESSURE AT HOME--PLEASE LET us KNOW IF YOUR BLOOD PRESSURE IS GREATER THAN 120/80--

## 2020-01-20 ENCOUNTER — Ambulatory Visit (HOSPITAL_COMMUNITY): Payer: Medicare HMO | Attending: Cardiovascular Disease

## 2020-01-20 ENCOUNTER — Other Ambulatory Visit: Payer: Self-pay

## 2020-01-20 DIAGNOSIS — R079 Chest pain, unspecified: Secondary | ICD-10-CM | POA: Diagnosis not present

## 2020-01-20 DIAGNOSIS — R0609 Other forms of dyspnea: Secondary | ICD-10-CM

## 2020-01-20 DIAGNOSIS — R0600 Dyspnea, unspecified: Secondary | ICD-10-CM | POA: Insufficient documentation

## 2020-01-20 LAB — ECHOCARDIOGRAM COMPLETE
AR max vel: 1.6 cm2
AV Area VTI: 2.09 cm2
AV Area mean vel: 1.6 cm2
AV Mean grad: 11 mmHg
AV Peak grad: 18.5 mmHg
Ao pk vel: 2.15 m/s
Area-P 1/2: 2.62 cm2
S' Lateral: 2.6 cm

## 2020-01-21 ENCOUNTER — Telehealth: Payer: Self-pay | Admitting: *Deleted

## 2020-01-21 NOTE — Telephone Encounter (Signed)
No problem. We can renew his simvastatin and can repeat lipids in 6 weeks.

## 2020-01-21 NOTE — Telephone Encounter (Signed)
Patient was called with lab results.  During the phone call he wanted me to let Dr. Johney Frame know that he had to stop taking the Rosuvastatin that she had started him on because it caused an itchy rash over his chest and legs. States he is taking his old Simvastatin in the mean time as he had some left over. But he will need some more if he is to keep taking it.   Will forward to Dr. Johney Frame for advisement.

## 2020-01-28 ENCOUNTER — Telehealth: Payer: Self-pay | Admitting: *Deleted

## 2020-01-28 NOTE — Telephone Encounter (Signed)
-----   Message from [place] sent at 01/27/2020  4:14 PM EST ----- Regarding: ct heart Scheduled 1/11 @ 4:00  Pt will need labs done for this ct scan  Thanks, [place]

## 2020-01-28 NOTE — Telephone Encounter (Signed)
Spoke with the pt and he will come in for lab to check a BMET on 02/06/20, for upcoming Cardiac CT on 02/10/20.  This is per CT protocol. Pt verbalized understanding and agrees with this plan.

## 2020-02-05 ENCOUNTER — Other Ambulatory Visit: Payer: Self-pay

## 2020-02-05 ENCOUNTER — Other Ambulatory Visit: Payer: Medicare HMO

## 2020-02-05 DIAGNOSIS — R0609 Other forms of dyspnea: Secondary | ICD-10-CM

## 2020-02-05 DIAGNOSIS — R0600 Dyspnea, unspecified: Secondary | ICD-10-CM

## 2020-02-05 DIAGNOSIS — R079 Chest pain, unspecified: Secondary | ICD-10-CM

## 2020-02-06 ENCOUNTER — Other Ambulatory Visit: Payer: Medicare HMO

## 2020-02-06 LAB — BASIC METABOLIC PANEL
BUN/Creatinine Ratio: 13 (ref 10–24)
BUN: 11 mg/dL (ref 8–27)
CO2: 24 mmol/L (ref 20–29)
Calcium: 9.4 mg/dL (ref 8.6–10.2)
Chloride: 102 mmol/L (ref 96–106)
Creatinine, Ser: 0.86 mg/dL (ref 0.76–1.27)
GFR calc Af Amer: 97 mL/min/{1.73_m2} (ref >59)
GFR calc non Af Amer: 84 mL/min/{1.73_m2} (ref >59)
Glucose: 114 mg/dL — ABNORMAL HIGH (ref 65–99)
Potassium: 4.2 mmol/L (ref 3.5–5.2)
Sodium: 139 mmol/L (ref 134–144)

## 2020-02-09 ENCOUNTER — Telehealth (HOSPITAL_COMMUNITY): Payer: Self-pay | Admitting: Emergency Medicine

## 2020-02-09 NOTE — Telephone Encounter (Signed)
Attempted to call patient regarding upcoming cardiac CT appointment. °Left message on voicemail with name and callback number °Sanah Kraska RN Navigator Cardiac Imaging °[facility] °[phone] Office °[phone] Cell ° °

## 2020-02-10 ENCOUNTER — Telehealth: Payer: Self-pay | Admitting: Cardiology

## 2020-02-10 ENCOUNTER — Telehealth (HOSPITAL_COMMUNITY): Payer: Self-pay | Admitting: Emergency Medicine

## 2020-02-10 ENCOUNTER — Ambulatory Visit (HOSPITAL_COMMUNITY)
Admission: RE | Admit: 2020-02-10 | Discharge: 2020-02-10 | Disposition: A | Discharge status: Home / Self Care | Payer: Medicare HMO | Source: Ambulatory Visit | Attending: Cardiology | Admitting: Cardiology

## 2020-02-10 ENCOUNTER — Other Ambulatory Visit: Payer: Self-pay

## 2020-02-10 DIAGNOSIS — R079 Chest pain, unspecified: Secondary | ICD-10-CM

## 2020-02-10 DIAGNOSIS — R0600 Dyspnea, unspecified: Secondary | ICD-10-CM | POA: Diagnosis present

## 2020-02-10 DIAGNOSIS — I700 Atherosclerosis of aorta: Secondary | ICD-10-CM | POA: Insufficient documentation

## 2020-02-10 DIAGNOSIS — R0609 Other forms of dyspnea: Secondary | ICD-10-CM

## 2020-02-10 DIAGNOSIS — E7800 Pure hypercholesterolemia, unspecified: Secondary | ICD-10-CM

## 2020-02-10 DIAGNOSIS — K449 Diaphragmatic hernia without obstruction or gangrene: Secondary | ICD-10-CM | POA: Insufficient documentation

## 2020-02-10 DIAGNOSIS — I2510 Atherosclerotic heart disease of native coronary artery without angina pectoris: Secondary | ICD-10-CM | POA: Diagnosis not present

## 2020-02-10 DIAGNOSIS — Z79899 Other long term (current) drug therapy: Secondary | ICD-10-CM

## 2020-02-10 MED ORDER — NITROGLYCERIN 0.4 MG SL SUBL
SUBLINGUAL_TABLET | SUBLINGUAL | Status: AC
  Administered 2020-02-10: 0.8 mg via SUBLINGUAL
  Filled 2020-02-10: qty 2

## 2020-02-10 MED ORDER — NITROGLYCERIN 0.4 MG SL SUBL: 0.8000 mg | SUBLINGUAL_TABLET | SUBLINGUAL | Status: DC | PRN

## 2020-02-10 MED ORDER — METOPROLOL TARTRATE 5 MG/5ML IV SOLN
INTRAVENOUS | Status: AC
  Filled 2020-02-10: qty 5

## 2020-02-10 MED ORDER — IOHEXOL 350 MG/ML SOLN
80.0000 mL | Freq: Once | INTRAVENOUS | Status: AC | PRN
  Administered 2020-02-10: 80 mL via INTRAVENOUS

## 2020-02-10 MED ORDER — METOPROLOL TARTRATE 5 MG/5ML IV SOLN: 5.0000 mg | INTRAVENOUS | Status: DC | PRN

## 2020-02-10 NOTE — Telephone Encounter (Signed)
Pt returning phone call regarding upcoming cardiac imaging study; pt verbalizes understanding of appt date/time, parking situation and where to check in, pre-test NPO status and medications ordered, and verified current allergies; name and call back number provided for further questions should they arise Marchia Bond RN Navigator Cardiac Imaging [facility] [phone] office [phone] cell  50mg  metop 2h PTA

## 2020-02-10 NOTE — Telephone Encounter (Signed)
Joseph Moyer from [facility] calling with a CT Heart with calcium scoring call report.

## 2020-02-10 NOTE — Telephone Encounter (Signed)
Spoke with Malachy Mood from [facility]. Had overread report for patients's CT.  Full report to follow.  "evidence for mediastinal and hilar lymph adenopathy. rec CT chest w IV contrast."

## 2020-02-11 ENCOUNTER — Telehealth: Payer: Self-pay

## 2020-02-11 DIAGNOSIS — R931 Abnormal findings on diagnostic imaging of heart and coronary circulation: Secondary | ICD-10-CM | POA: Diagnosis not present

## 2020-02-11 DIAGNOSIS — R590 Localized enlarged lymph nodes: Secondary | ICD-10-CM

## 2020-02-11 DIAGNOSIS — I2510 Atherosclerotic heart disease of native coronary artery without angina pectoris: Secondary | ICD-10-CM | POA: Diagnosis not present

## 2020-02-11 NOTE — Telephone Encounter (Signed)
Left detailed message for Pt.  Advised chest CT had been ordered for incidental finding of enlarged lymph nodes. Advised would receive a call to schedule this test.  Advised would refer to pulmonology for further evaluation.  Advised cardiac CT results still pending and Pt  Would receive a call with these results.  Advised to call back if any questions.

## 2020-02-11 NOTE — Telephone Encounter (Signed)
-----   Message from Freada Bergeron, MD sent at 02/11/2020  9:02 AM EST ----- His CTA of his chest showed some enlarged lymph nodes. This can be due to numerous different causes (inflammation, infection, rheumatologic, malignant etc) but we need a bit more information. Can we order a plain CT chest with IV contrast to further characterize this and refer to Pulmonary for further work-up? I will follow-up his coronary artery findings once they are complete.

## 2020-02-16 MED ORDER — SIMVASTATIN 20 MG PO TABS: 20.0000 mg | ORAL_TABLET | Freq: Every evening | ORAL | 1 refills | Status: DC

## 2020-02-16 NOTE — Telephone Encounter (Signed)
Follow Up:      Pt is calling for his CT results please.

## 2020-02-16 NOTE — Telephone Encounter (Signed)
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 This means that he has plaque but it is not blocking blood flow. He needs to continue his aspirin and statin to help prevent this disease from progressing. If he develops chest pain on exertion, we need to have him come back to be evaluated.   The patient has been notified of the result and verbalized understanding.  All questions (if any) were answered. Nuala Alpha, LPN 07/12/2447 7:53 PM

## 2020-02-16 NOTE — Telephone Encounter (Signed)
Also while talking to the pt, he mentioned that he called on 12/22 and spoke with one of our triage nurses about not being able to tolerate rosuvastatin, for it made him itch, so he switched his statin back to his old regimen of simvastatin 20 mg po every evening. Pt states he never heard back from the nurse he spoke with, so he assumed it was ok to switch back to his old regimen of simvastatin.  Pt states he has been taking simvastatin since 12/22, and is tolerating it well.   Informed the pt that it appears the triage nurse he spoke with did not get back with him to inform him that Dr. Johney Frame was ok with him stopping rosuvastatin and starting back his old regimen of simvastatin.  She also wanted him to have repeat lipids  done in 6 weeks, to reassess his numbers since switching back to his old regimen.  Pts med list was not corrected nor was he made a lab appt, as Dr. Johney Frame advised.   Went ahead and scheduled the pt to come in for repeat labs, for 03/01/20, which would be 6 weeks from when he switched his statin around.  Updated rosuvastatin in his allergies as an intolerance, and placed simvastatin 20 mg by mouth every evening, back on his med list.  Pt states he has enough supply of his simvastatin on hand at this time.  Advised the pt to come fasting to his lab appt.  Pt verbalized understanding and agrees with this plan. Below is supporting documentation, in regards to Dr. Jodi Mourning the pt to switch his statin back to the original simvastatin, and come in for repeat labs in 6 weeks, thereafter.    Darrell Jewel, RN      Conversation (Newest Message First)  Freada Bergeron, MD   HP  01/21/20 9:02 AM Note   No problem. We can renew his simvastatin and can repeat lipids in 6 weeks.      Darrell Jewel, RN to Freada Bergeron, MD      01/21/20 8:29 AM Please advise.    Darrell Jewel, RN     01/21/20 8:29 AM Note   Patient was called with  lab results.  During the phone call he wanted me to let Dr. Johney Frame know that he had to stop taking the Rosuvastatin that she had started him on because it caused an itchy rash over his chest and legs. States he is taking his old Simvastatin in the mean time as he had some left over. But he will need some more if he is to keep taking it.   Will forward to Dr. Johney Frame for advisement.

## 2020-02-18 ENCOUNTER — Telehealth: Payer: Self-pay | Admitting: Cardiology

## 2020-02-18 NOTE — Telephone Encounter (Signed)
Follow up:    Patient do not understand about all these apt. Please call patient back concerning this matter.

## 2020-02-18 NOTE — Telephone Encounter (Signed)
Patient calling in to inquire about multiple upcoming appointments.   -CT scan to evaluate enlarged lymph nodes -Repeat labs to follow up after stain changes -OV 87m f/u with Dr. Johney Frame  Discussed need for all of the above in depth with patient. He is agreeable to keep all appointments and will call back with any new concerns.

## 2020-02-19 ENCOUNTER — Ambulatory Visit (INDEPENDENT_AMBULATORY_CARE_PROVIDER_SITE_OTHER)
Admission: RE | Admit: 2020-02-19 | Discharge: 2020-02-19 | Disposition: A | Discharge status: Home / Self Care | Payer: Medicare HMO | Source: Ambulatory Visit | Attending: Cardiology | Admitting: Cardiology

## 2020-02-19 ENCOUNTER — Other Ambulatory Visit: Payer: Self-pay

## 2020-02-19 ENCOUNTER — Inpatient Hospital Stay: Admission: RE | Admit: 2020-02-19 | Payer: Medicare HMO | Source: Ambulatory Visit

## 2020-02-19 DIAGNOSIS — J432 Centrilobular emphysema: Secondary | ICD-10-CM | POA: Diagnosis not present

## 2020-02-19 DIAGNOSIS — R590 Localized enlarged lymph nodes: Secondary | ICD-10-CM | POA: Diagnosis not present

## 2020-02-19 DIAGNOSIS — I700 Atherosclerosis of aorta: Secondary | ICD-10-CM | POA: Diagnosis not present

## 2020-02-19 DIAGNOSIS — I2510 Atherosclerotic heart disease of native coronary artery without angina pectoris: Secondary | ICD-10-CM | POA: Diagnosis not present

## 2020-02-19 MED ORDER — IOHEXOL 300 MG/ML  SOLN
80.0000 mL | Freq: Once | INTRAMUSCULAR | Status: AC | PRN
  Administered 2020-02-19: 80 mL via INTRAVENOUS

## 2020-02-20 ENCOUNTER — Telehealth: Payer: Self-pay

## 2020-02-20 NOTE — Telephone Encounter (Signed)
-----   Message from Freada Bergeron, MD sent at 02/20/2020  1:41 PM EST ----- The CT of his chest confirms significant lymphadenopathy. We need to refer to thoracic oncology (if we have one here) as well as ensure he has follow-up with pulmonary. Thank you so much!

## 2020-02-20 NOTE — Telephone Encounter (Signed)
Pt called.  Pt rescheduled for February 24, 2020 at 2:40 pm with Dr. Johney Frame to discuss results of test.

## 2020-02-22 ENCOUNTER — Telehealth: Payer: Self-pay | Admitting: Acute Care

## 2020-02-22 NOTE — Telephone Encounter (Signed)
[facility]:  We were contacted by the [facility].  Recommendations: Needs nodule appointment with [facility] or Icard within next 2 weeks Appointment requested: Yes Time frame: Within next 2 weeks  Marg[facility] 02/22/2020 5:55 PM     IMPRESSION: 1. Extensive thoracic adenopathy, highly suspicious for lymphoproliferative process. Consider multidisciplinary thoracic oncology consultation for eventual PET and tissue sampling. 2. Possible soft tissue fullness about the low left neck. This may represent adenopathy. Recommend attention on follow-up. 3. Upper normal sized upper abdominal nodes, indeterminate. 4. 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

## 2020-02-23 ENCOUNTER — Telehealth: Payer: Self-pay | Admitting: Pulmonary Disease

## 2020-02-23 ENCOUNTER — Ambulatory Visit: Payer: Medicare HMO | Admitting: Cardiology

## 2020-02-23 NOTE — Telephone Encounter (Signed)
[facility]:  We were contacted by the [facility].  Recommendations: adenopathy  Appointment requested: yes  Time frame: please schedule urgent, next available in lung mass/adenopathy slot  Jolaine Artist [facility] 02/23/2020 11:22 AM

## 2020-02-23 NOTE — Telephone Encounter (Signed)
-----   Message from Annie Paras sent at 02/21/2020  1:23 PM EST ----- Regarding: Radiology Follow Up This one had the radiologist specifically request this group.  Extensive thoracic adenopathy, highly suspicious for lymphoproliferative process. Consider multidisciplinary thoracic oncology consultation for eventual PET and tissue sampling.

## 2020-02-24 ENCOUNTER — Encounter: Payer: Self-pay | Admitting: Cardiology

## 2020-02-24 ENCOUNTER — Other Ambulatory Visit: Payer: Self-pay

## 2020-02-24 ENCOUNTER — Ambulatory Visit: Payer: Medicare HMO | Admitting: Cardiology

## 2020-02-24 VITALS — BP 158/80 | HR 85 | Ht 71.0 in | Wt 218.0 lb

## 2020-02-24 DIAGNOSIS — R590 Localized enlarged lymph nodes: Secondary | ICD-10-CM | POA: Diagnosis not present

## 2020-02-24 DIAGNOSIS — I10 Essential (primary) hypertension: Secondary | ICD-10-CM | POA: Diagnosis not present

## 2020-02-24 DIAGNOSIS — E7800 Pure hypercholesterolemia, unspecified: Secondary | ICD-10-CM | POA: Diagnosis not present

## 2020-02-24 DIAGNOSIS — R0600 Dyspnea, unspecified: Secondary | ICD-10-CM | POA: Diagnosis not present

## 2020-02-24 DIAGNOSIS — R0609 Other forms of dyspnea: Secondary | ICD-10-CM

## 2020-02-24 DIAGNOSIS — I2510 Atherosclerotic heart disease of native coronary artery without angina pectoris: Secondary | ICD-10-CM | POA: Diagnosis not present

## 2020-02-24 MED ORDER — LOSARTAN POTASSIUM 25 MG PO TABS: 25.0000 mg | ORAL_TABLET | Freq: Every day | ORAL | 3 refills | Status: DC

## 2020-02-24 MED ORDER — AMLODIPINE BESYLATE 10 MG PO TABS: 10.0000 mg | ORAL_TABLET | Freq: Every day | ORAL | 3 refills | Status: DC

## 2020-02-24 MED ORDER — ATORVASTATIN CALCIUM 40 MG PO TABS: 40.0000 mg | ORAL_TABLET | Freq: Every day | ORAL | 3 refills | Status: DC

## 2020-02-24 NOTE — Progress Notes (Signed)
Cardiology Office Note:    Date:  02/25/2020   ID:  Joseph Moyer, DOB 1940/02/16, MRN [id number]  PCP:  [name], MD  [facility] Cardiologist:  No primary care provider on file.  [facility] Electrophysiologist:  None   Referring MD: [name], MD     History of Present Illness:    Joseph Moyer is a 80 y.o. 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  The patient was seen by his primary care physician's on 11/14/19 where he had been having exertional shortness of breath that he has noticed mainly when cutting the grass. He has had to stop working in order to catch his breath. No associated chest pain, diaphoresis, nausea or vomiting. He also has similar symptoms when climbing the stairs. Prior had prior cath in 2006 with no significant disease.   During our visit, we recommended coronary CTA which showed moderate, non-obstructive CAD with calcium score 257. Incidentally on that imaging, he was noted to have significant hilar and mediastinal LAD. We then performed a follow-up dedicated CT chest which again confirmed the presence extensive thoracic adenopathy, highly suspicious for lymphoproliferative process. He now presents to clinic for follow-up and for referral to  Thoracic oncology for eventual PET and tissue sampling.  Coronary CTA: RCA is a large dominant artery that gives rise to PDA and PLVB. There is minimal (<25%) calcified plaque proximally and mild (25-49%) calcified plaque in the mid RCA.  Left main is a large artery that gives rise to LAD and LCX arteries.  LAD is a large vessel. There is mild (25-49%) mixed plaque at the ostium and proximal LAD. There is moderate (50-69%) mixed plaque in the mid LAD. There is a small diagonal with mild (25-49%) calcified plaque at the ostium.  LCX is a non-dominant artery that gives rise to one large OM1 branch.  There is minimal (<25%) calcified plaque proximally. There is a large, branching OM1  Chest CT: IMPRESSION: 1. Extensive thoracic adenopathy, highly suspicious for lymphoproliferative process. Consider multidisciplinary thoracic oncology consultation for eventual PET and tissue sampling. 2. Possible soft tissue fullness about the low left neck. This may represent adenopathy. Recommend attention on follow-up. 3. Upper normal sized upper abdominal nodes, indeterminate. 4. 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  Today, the patient states that he feels well. Continues to have shortness of breath on exertion. He states he is anxious about the CT findings and wants to get it worked up as soon as possible. No chest pain, bleeding issues. He has not felt any abnormal lumps on his body that he knows of.  Past Medical History:  Diagnosis Date  . Allergy   . Arthritis   . Hyperlipidemia   . Hypertension     Past Surgical History:  Procedure Laterality Date  . CIRCUMCISION    . COLONOSCOPY WITH PROPOFOL N/A 05/10/2015   Procedure: COLONOSCOPY WITH PROPOFOL;  Surgeon: Garlan Fair, MD;  Location: [facility];  Service: Endoscopy;  Laterality: N/A;  . DECOMPRESSIVE LUMBAR LAMINECTOMY LEVEL 1 N/A 06/15/2018   Procedure: Gordy Levan decompression L1-L2/ complete inferior facetectomyof L1. Posterior lateral arthrodesiswith autograft bone L1-L2.;  Surgeon: Melina Schools, MD;  Location: [facility];  Service: Orthopedics;  Laterality: N/A;  . HEMORRHOID SURGERY      Current Medications: Current Meds  Medication Sig  . amLODipine (NORVASC) 10 MG tablet Take 1 tablet (10 mg  total) by mouth daily.  Marland Kitchen aspirin 81 MG tablet Take 81 mg by mouth every morning.   Marland Kitchen atorvastatin (LIPITOR) 40 MG tablet Take 1 tablet (40 mg total) by mouth daily.  Marland Kitchen losartan (COZAAR) 25 MG tablet Take 1 tablet (25 mg total) by mouth daily.  . meloxicam (MOBIC) 15 MG tablet  Take 15 mg by mouth daily.  . Naproxen Sodium (ALEVE PO) Take 1 tablet by mouth daily as needed (for pain).  . [DISCONTINUED] amLODipine (NORVASC) 5 MG tablet Take 5 mg by mouth daily.  . [DISCONTINUED] simvastatin (ZOCOR) 20 MG tablet Take 1 tablet (20 mg total) by mouth every evening.     Allergies:   Oxycodone and Rosuvastatin   Social History   Socioeconomic History  . Marital status: Married    Spouse name: Not on file  . Number of children: Not on file  . Years of education: Not on file  . Highest education level: Not on file  Occupational History  . Not on file  Tobacco Use  . Smoking status: Former Smoker    Quit date: 1994    Years since quitting: 28.0  . Smokeless tobacco: Never Used  Substance and Sexual Activity  . Alcohol use: Yes    Alcohol/week: 0.0 standard drinks    Comment: occassionally  . Drug use: No  . Sexual activity: Not on file  Other Topics Concern  . Not on file  Social History Narrative  . Not on file   Social Determinants of Health   Financial Resource Strain: Not on file  Food Insecurity: Not on file  Transportation Needs: Not on file  Physical Activity: Not on file  Stress: Not on file  Social Connections: Not on file     Family History: The patient's family history includes Diabetes in his brother, mother, and sister.  ROS:   Please see the history of present illness.    Review of Systems  Constitutional: Negative for chills, fever and weight loss.  HENT: Negative for congestion.   Eyes: Negative for blurred vision.  Respiratory: Positive for shortness of breath.   Cardiovascular: Negative for chest pain, palpitations, orthopnea, claudication, leg swelling and PND.  Gastrointestinal: Negative for nausea and vomiting.  Genitourinary: Negative for hematuria.  Musculoskeletal: Positive for myalgias.  Neurological: Negative for dizziness and loss of consciousness.  Endo/Heme/Allergies: Negative for polydipsia.   Psychiatric/Behavioral: Negative for substance abuse.    EKGs/Labs/Other Studies Reviewed:    The following studies were reviewed today: CTA coronaries 02/10/20: FINDINGS: A 120 kV prospective scan was triggered in the descending thoracic aorta at 111 HU's. Axial non-contrast 3 mm slices were carried out through the heart. The data set was analyzed on a dedicated work station and scored using the Avalon. Gantry rotation speed was 250 msecs and collimation was .6 mm. No beta blockade and 0.8 mg of sl NTG was given. The 3D data set was reconstructed in 5% intervals of the 67-82 % of the R-R cycle. Diastolic phases were analyzed on a dedicated work station using MPR, MIP and VRT modes. The patient received 80 cc of contrast.  Aorta: Normal size. Ascending aorta 3.0 cm. Mild calcifications. No dissection.  Aortic Valve:  Trileaflet.  Mild calcification.  Coronary Arteries:  Normal coronary origin.  Right dominance.  RCA is a large dominant artery that gives rise to PDA and PLVB. There is minimal (<25%) calcified plaque proximally and mild (25-49%) calcified plaque in the mid RCA.  Left main is a  large artery that gives rise to LAD and LCX arteries.  LAD is a large vessel. There is mild (25-49%) mixed plaque at the ostium and proximal LAD. There is moderate (50-69%) mixed plaque in the mid LAD. There is a small diagonal with mild (25-49%) calcified plaque at the ostium.  LCX is a non-dominant artery that gives rise to one large OM1 branch. There is minimal (<25%) calcified plaque proximally. There is a large, branching OM1  Other findings:  Normal pulmonary vein drainage into the left atrium.  Normal let atrial appendage without a thrombus.  Normal size of the pulmonary artery.  IMPRESSION: 1. Coronary calcium score of 257. This was 67th percentile for age and sex matched control.  2.  Normal coronary origin with right dominance.  3. Moderate  plaque in the mid LAD, mild plaque in the proximal LAD and D1. CAD RADS 3.  4.  Mild calcification of the aorta.  5.  Mild calcification of the aortic valve.  6. Will send study for FFRct to assess for obstructive disease in the LAD.  CT chest 02/19/20: IMPRESSION: 1. Extensive thoracic adenopathy, highly suspicious for lymphoproliferative process. Consider multidisciplinary thoracic oncology consultation for eventual PET and tissue sampling. 2. Possible soft tissue fullness about the low left neck. This may represent adenopathy. Recommend attention on follow-up. 3. Upper normal sized upper abdominal nodes, indeterminate. 4. 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  TTE 01/20/20: IMPRESSIONS  1. Left ventricular ejection fraction, by estimation, is 65 to 70%. The  left ventricle has normal function. The left ventricle has no regional  wall motion abnormalities. Left ventricular diastolic parameters were  normal.  2. Right ventricular systolic function is normal. The right ventricular  size is normal. Tricuspid regurgitation signal is inadequate for assessing  PA pressure.  3. The mitral valve is grossly normal. No evidence of mitral valve  regurgitation. No evidence of mitral stenosis.  4. The aortic valve is tricuspid. There is mild calcification of the  aortic valve. Aortic valve regurgitation is not visualized. No aortic  stenosis is present.  5. The inferior vena cava is normal in size with greater than 50%  respiratory variability, suggesting right atrial pressure of 3 mmHg.   Conclusion(s)/Recommendation(s): Normal biventricular function without  evidence of hemodynamically significant valvular heart disease.    Recent Labs: 02/05/2020: BUN 11; Creatinine, Ser 0.86; Potassium 4.2; Sodium 139 02/24/2020: Hemoglobin 12.9; Platelets 181  Recent Lipid Panel    Component Value Date/Time   CHOL 171  02/24/2020 1542   TRIG 79 02/24/2020 1542   HDL 66 02/24/2020 1542   CHOLHDL 2.6 02/24/2020 1542   LDLCALC 90 02/24/2020 1542      Physical Exam:    VS:  BP (!) 158/80   Pulse 85   Ht 5\' 11"  (1.803 m)   Wt 218 lb (98.9 kg)   SpO2 97%   BMI 30.40 kg/m     Wt Readings from Last 3 Encounters:  02/24/20 218 lb (98.9 kg)  12/23/19 217 lb (98.4 kg)  06/26/18 190 lb 2 oz (86.2 kg)     GEN:  Well nourished, well developed in no acute distress HEENT: Normal NECK: No JVD; No carotid bruits CARDIAC: RRR, 1/6 systolic murmur. No rubs or gallops RESPIRATORY:  Clear to auscultation without rales, wheezing or rhonchi  ABDOMEN: Soft, non-tender, non-distended MUSCULOSKELETAL:  No edema; No deformity  SKIN: Warm and dry NEUROLOGIC:  Alert and oriented x 3 PSYCHIATRIC:  Normal affect  ASSESSMENT:    1. Coronary artery disease involving native coronary artery of native heart without angina pectoris   2. Localized enlarged lymph nodes   3. DOE (dyspnea on exertion)   4. Pure hypercholesterolemia   5. Primary hypertension    PLAN:    In order of problems listed above:  #Extensive Thoracic Adenopathy: Incidentally discovered while underoing coronary CTA to work-up SOB. Highly concerning for lymphoproliferative disease. Will need thoracic oncology follow-up. -Refer to thoracic oncology -Likely needs PET and tissue sampling -Check CBC with differential today -Follow-up with Pulmonary as well  #Moderate non-obstructive disease: Patient with worsening SOB with exertion for which coronary CTA performed. Noted to have moderate non-obstructive disease. TTE with normal biventricular function with no significant valvular pathology. Possibly symptoms related to lymphoproliferative process as detailed above. Will continue aggressive risk factor modification of underlying CAD. -Continue ASA 81mg  daily -Had itching with crestor, will change to atorvastatin 40mg  daily -Start losartan 25mg   today; BMET in 1 week -Management of adenopathy as above  #HTN: Elevated at home.  -Increase amlodipine to 10mg  daily -Start losartan 25mg  -Will keep BP log and follow-up with me at next visit to assess blood pressure  #LE Pain: Mainly occurs at night when laying down. No exertional symptoms or wounds. Extremities warm with palpable PTs; dimished Dps. Do not suspect significant PAD, however, if symptoms persist or become exertional, will check ABIs. -If symptoms persist or become exertional, will check ABIs -Continue ASA and statin as above -Recommend Mag  #HLD: LDL 82, HDL 51, TG 206, TC 167 -Continue Lipitor 40mg  daily    Medication Adjustments/Labs and Tests Ordered: Current medicines are reviewed at length with the patient today.  Concerns regarding medicines are outlined above.  Orders Placed This Encounter  Procedures  . Lipid panel  . CBC w/Diff  . Ambulatory referral to Thoracic Oncology   Meds ordered this encounter  Medications  . atorvastatin (LIPITOR) 40 MG tablet    Sig: Take 1 tablet (40 mg total) by mouth daily.    Dispense:  90 tablet    Refill:  3  . amLODipine (NORVASC) 10 MG tablet    Sig: Take 1 tablet (10 mg total) by mouth daily.    Dispense:  90 tablet    Refill:  3  . losartan (COZAAR) 25 MG tablet    Sig: Take 1 tablet (25 mg total) by mouth daily.    Dispense:  90 tablet    Refill:  3    Patient Instructions  Medication Instructions:  Your physician has recommended you make the following change in your medication:  1.  STOP Crestor 2.  START Lipitor 40 mg taking 1 daily 3.  INCREASE the Amlodipine to 10 mg taking 1 daily.  You may use the 5 mg tablets by taking 2 of them at the same time.  I have sent in a new prescription for the 10 mg tablets.  When you get them, only take 1 a day 4.  START Losartan 25 mg taking 1 tablet daily   *If you need a refill on your cardiac medications before your next appointment, please call your  pharmacy*   Lab Work: TODAY:  LIPID & CBC W/DIFF  If you have labs (blood work) drawn today and your tests are completely normal, you will receive your results only by: Marland Kitchen MyChart Message (if you have MyChart) OR . A paper copy in the mail If you have any lab test that is abnormal or we  need to change your treatment, we will call you to review the results.   Testing/Procedures: None ordered  You have been referred to Thoracic Oncology.  They will call you with an appointment.     Follow-Up: At [facility], you and your health needs are our priority.  As part of our continuing mission to provide you with exceptional heart care, we have created designated Provider Care Teams.  These Care Teams include your primary Cardiologist (physician) and Advanced Practice Providers (APPs -  Physician Assistants and Nurse Practitioners) who all work together to provide you with the care you need, when you need it.  We recommend signing up for the patient portal called "MyChart".  Sign up information is provided on this After Visit Summary.  MyChart is used to connect with patients for Virtual Visits (Telemedicine).  Patients are able to view lab/test results, encounter notes, upcoming appointments, etc.  Non-urgent messages can be sent to your provider as well.   To learn more about what you can do with MyChart, go to [email or web address].    Your next appointment:   Keep your appointment with Dr. Johney Frame for next week    Other Instructions      Signed, Freada Bergeron, MD  02/25/2020 7:58 AM    [place]

## 2020-02-24 NOTE — Patient Instructions (Addendum)
Medication Instructions:  Your physician has recommended you make the following change in your medication:  1.  STOP Crestor 2.  START Lipitor 40 mg taking 1 daily 3.  INCREASE the Amlodipine to 10 mg taking 1 daily.  You may use the 5 mg tablets by taking 2 of them at the same time.  I have sent in a new prescription for the 10 mg tablets.  When you get them, only take 1 a day 4.  START Losartan 25 mg taking 1 tablet daily   *If you need a refill on your cardiac medications before your next appointment, please call your pharmacy*   Lab Work: TODAY:  LIPID & CBC W/DIFF  If you have labs (blood work) drawn today and your tests are completely normal, you will receive your results only by: Marland Kitchen MyChart Message (if you have MyChart) OR . A paper copy in the mail If you have any lab test that is abnormal or we need to change your treatment, we will call you to review the results.   Testing/Procedures: None ordered  You have been referred to Thoracic Oncology.  They will call you with an appointment.     Follow-Up: At [facility], you and your health needs are our priority.  As part of our continuing mission to provide you with exceptional heart care, we have created designated Provider Care Teams.  These Care Teams include your primary Cardiologist (physician) and Advanced Practice Providers (APPs -  Physician Assistants and Nurse Practitioners) who all work together to provide you with the care you need, when you need it.  We recommend signing up for the patient portal called "MyChart".  Sign up information is provided on this After Visit Summary.  MyChart is used to connect with patients for Virtual Visits (Telemedicine).  Patients are able to view lab/test results, encounter notes, upcoming appointments, etc.  Non-urgent messages can be sent to your provider as well.   To learn more about what you can do with MyChart, go to [email or web address].    Your next appointment:   Keep your  appointment with Dr. Johney Frame for next week    Other Instructions

## 2020-02-25 ENCOUNTER — Telehealth: Payer: Self-pay | Admitting: *Deleted

## 2020-02-25 DIAGNOSIS — I10 Essential (primary) hypertension: Secondary | ICD-10-CM | POA: Diagnosis not present

## 2020-02-25 DIAGNOSIS — E7800 Pure hypercholesterolemia, unspecified: Secondary | ICD-10-CM | POA: Diagnosis not present

## 2020-02-25 DIAGNOSIS — I2510 Atherosclerotic heart disease of native coronary artery without angina pectoris: Secondary | ICD-10-CM

## 2020-02-25 DIAGNOSIS — G4700 Insomnia, unspecified: Secondary | ICD-10-CM | POA: Diagnosis not present

## 2020-02-25 DIAGNOSIS — E785 Hyperlipidemia, unspecified: Secondary | ICD-10-CM

## 2020-02-25 LAB — CBC WITH DIFFERENTIAL/PLATELET
Basophils Absolute: 0 10*3/uL (ref 0.0–0.2)
Basos: 0 %
EOS (ABSOLUTE): 0.1 10*3/uL (ref 0.0–0.4)
Eos: 1 %
Hematocrit: 39.6 % (ref 37.5–51.0)
Hemoglobin: 12.9 g/dL — ABNORMAL LOW (ref 13.0–17.7)
Immature Grans (Abs): 0 10*3/uL (ref 0.0–0.1)
Immature Granulocytes: 0 %
Lymphocytes Absolute: 5.3 10*3/uL — ABNORMAL HIGH (ref 0.7–3.1)
Lymphs: 63 %
MCH: 28.5 pg (ref 26.6–33.0)
MCHC: 32.6 g/dL (ref 31.5–35.7)
MCV: 87 fL (ref 79–97)
Monocytes Absolute: 1.1 10*3/uL — ABNORMAL HIGH (ref 0.1–0.9)
Monocytes: 13 %
Neutrophils Absolute: 2 10*3/uL (ref 1.4–7.0)
Neutrophils: 23 %
Platelets: 181 10*3/uL (ref 150–450)
RBC: 4.53 x10E6/uL (ref 4.14–5.80)
RDW: 15.6 % — ABNORMAL HIGH (ref 11.6–15.4)
WBC: 8.5 10*3/uL (ref 3.4–10.8)

## 2020-02-25 LAB — LIPID PANEL
Chol/HDL Ratio: 2.6 ratio (ref 0.0–5.0)
Cholesterol, Total: 171 mg/dL (ref 100–199)
HDL: 66 mg/dL (ref >39)
LDL Chol Calc (NIH): 90 mg/dL (ref 0–99)
Triglycerides: 79 mg/dL (ref 0–149)
VLDL Cholesterol Cal: 15 mg/dL (ref 5–40)

## 2020-02-25 NOTE — Progress Notes (Signed)
Cardiology Office Note:    Date:  03/01/2020   ID:  Joseph Moyer, DOB 1940/05/02, MRN [id number]  PCP:  [name], MD  [facility] Cardiologist:  Freada Bergeron, MD  [facility] Electrophysiologist:  None   Referring MD: [name], MD    History of Present Illness:    Joseph Moyer is a 80 y.o. 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  The patient was seen by his primary care physician's on 11/14/19 where he had been having exertional shortness of breath that he has noticed mainly when cutting the grass. He has had to stop working in order to catch his breath. No associated chest pain, diaphoresis, nausea or vomiting. He also has similar symptoms when climbing the stairs. Prior had prior cath in 2006 with no significant disease.  During our visit, we recommended coronary CTA which showed moderate, non-obstructive CAD with calcium score 257. Incidentally on that imaging, he was noted to have significant hilar and mediastinal LAD. We then performed a follow-up dedicated CT chest which again confirmed the presence extensive thoracic adenopathy, highly suspicious for lymphoproliferative process. He now presents to clinic for follow-up and for referral to  Thoracic oncology for eventual PET and tissue sampling.  Coronary CTA: RCA is a large dominant artery that gives rise to PDA and PLVB. There is minimal (<25%) calcified plaque proximally and mild (25-49%) calcified plaque in the mid RCA.  Left main is a large artery that gives rise to LAD and LCX arteries.  LAD is a large vessel. There is mild (25-49%) mixed plaque at the ostium and proximal LAD. There is moderate (50-69%) mixed plaque in the mid LAD. There is a small diagonal with mild (25-49%) calcified plaque at the ostium.  LCX is a non-dominant artery that gives rise to one large OM1 branch. There is  minimal (<25%) calcified plaque proximally. There is a large, branching OM1  Chest CT: IMPRESSION: 1. Extensive thoracic adenopathy, highly suspicious for lymphoproliferative process. Consider multidisciplinary thoracic oncology consultation for eventual PET and tissue sampling. 2. Possible soft tissue fullness about the low left neck. This may represent adenopathy. Recommend attention on follow-up. 3. Upper normal sized upper abdominal nodes, indeterminate. 4. 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  Today the patient returns for blood pressure monitoring since increasing his amlodipine. Blood pressure is well controlled and has been running 110-130s at home. He has been tolerating his medications without issues. No LE edema, lightheadedness, dizziness, or syncope.   Past Medical History:  Diagnosis Date  . Allergy   . Arthritis   . Hyperlipidemia   . Hypertension     Past Surgical History:  Procedure Laterality Date  . CIRCUMCISION    . COLONOSCOPY WITH PROPOFOL N/A 05/10/2015   Procedure: COLONOSCOPY WITH PROPOFOL;  Surgeon: Garlan Fair, MD;  Location: [facility];  Service: Endoscopy;  Laterality: N/A;  . DECOMPRESSIVE LUMBAR LAMINECTOMY LEVEL 1 N/A 06/15/2018   Procedure: Gordy Levan decompression L1-L2/ complete inferior facetectomyof L1. Posterior lateral arthrodesiswith autograft bone L1-L2.;  Surgeon: Melina Schools, MD;  Location: [place];  Service: Orthopedics;  Laterality: N/A;  . HEMORRHOID SURGERY      Current Medications: Current Meds  Medication Sig  . amLODipine (NORVASC) 10 MG tablet Take 1 tablet (10 mg total) by mouth daily.  Marland Kitchen aspirin 81 MG tablet Take 81 mg by mouth every morning.   Marland Kitchen  atorvastatin (LIPITOR) 40 MG tablet Take 1 tablet (40 mg total) by mouth daily.  Marland Kitchen losartan (COZAAR) 25 MG tablet Take 1 tablet (25 mg total) by mouth daily.  . meloxicam (MOBIC) 15 MG tablet Take 15 mg by mouth daily.   . Naproxen Sodium (ALEVE PO) Take 1 tablet by mouth daily as needed (for pain).     Allergies:   Oxycodone, Ramipril, and Rosuvastatin   Social History   Socioeconomic History  . Marital status: Married    Spouse name: Not on file  . Number of children: Not on file  . Years of education: Not on file  . Highest education level: Not on file  Occupational History  . Not on file  Tobacco Use  . Smoking status: Former Smoker    Quit date: 1994    Years since quitting: 28.1  . Smokeless tobacco: Never Used  Substance and Sexual Activity  . Alcohol use: Yes    Alcohol/week: 0.0 standard drinks    Comment: occassionally  . Drug use: No  . Sexual activity: Not on file  Other Topics Concern  . Not on file  Social History Narrative  . Not on file   Social Determinants of Health   Financial Resource Strain: Not on file  Food Insecurity: Not on file  Transportation Needs: Not on file  Physical Activity: Not on file  Stress: Not on file  Social Connections: Not on file     Family History: The patient's family history includes Diabetes in his brother, mother, and sister.  ROS:   Please see the history of present illness.    Review of Systems  Constitutional: Negative for chills and fever.  HENT: Negative for congestion.   Eyes: Negative for blurred vision.  Respiratory: Negative for shortness of breath.   Cardiovascular: Negative for chest pain, palpitations, orthopnea, claudication, leg swelling and PND.  Gastrointestinal: Negative for nausea and vomiting.  Genitourinary: Negative for hematuria.  Musculoskeletal: Positive for myalgias.  Neurological: Negative for dizziness and loss of consciousness.  Endo/Heme/Allergies: Negative for polydipsia.  Psychiatric/Behavioral: Negative for substance abuse.    EKGs/Labs/Other Studies Reviewed:    The following studies were reviewed today: CTA coronaries 02/10/20: FINDINGS: A 120 kV prospective scan was triggered in the  descending thoracic aorta at 111 HU's. Axial non-contrast 3 mm slices were carried out through the heart. The data set was analyzed on a dedicated work station and scored using the [place]. Gantry rotation speed was 250 msecs and collimation was .6 mm. No beta blockade and 0.8 mg of sl NTG was given. The 3D data set was reconstructed in 5% intervals of the 67-82 % of the R-R cycle. Diastolic phases were analyzed on a dedicated work station using MPR, MIP and VRT modes. The patient received 80 cc of contrast.  Aorta: Normal size. Ascending aorta 3.0 cm. Mild calcifications. No dissection.  Aortic Valve: Trileaflet. Mild calcification.  Coronary Arteries: Normal coronary origin. Right dominance.  RCA is a large dominant artery that gives rise to PDA and PLVB. There is minimal (<25%) calcified plaque proximally and mild (25-49%) calcified plaque in the mid RCA.  Left main is a large artery that gives rise to LAD and LCX arteries.  LAD is a large vessel. There is mild (25-49%) mixed plaque at the ostium and proximal LAD. There is moderate (50-69%) mixed plaque in the mid LAD. There is a small diagonal with mild (25-49%) calcified plaque at the ostium.  LCX is a non-dominant artery  that gives rise to one large OM1 branch. There is minimal (<25%) calcified plaque proximally. There is a large, branching OM1  Other findings:  Normal pulmonary vein drainage into the left atrium.  Normal let atrial appendage without a thrombus.  Normal size of the pulmonary artery.  IMPRESSION: 1. Coronary calcium score of 257. This was 67th percentile for age and sex matched control.  2. Normal coronary origin with right dominance.  3. Moderate plaque in the mid LAD, mild plaque in the proximal LAD and D1. CAD RADS 3.  4. Mild calcification of the aorta.  5. Mild calcification of the aortic valve.  6. Will send study for FFRct to assess for obstructive  disease in the LAD.  CT chest 02/19/20: IMPRESSION: 1. Extensive thoracic adenopathy, highly suspicious for lymphoproliferative process. Consider multidisciplinary thoracic oncology consultation for eventual PET and tissue sampling. 2. Possible soft tissue fullness about the low left neck. This may represent adenopathy. Recommend attention on follow-up. 3. Upper normal sized upper abdominal nodes, indeterminate. 4. 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  TTE 01/20/20: IMPRESSIONS  1. Left ventricular ejection fraction, by estimation, is 65 to 70%. The  left ventricle has normal function. The left ventricle has no regional  wall motion abnormalities. Left ventricular diastolic parameters were  normal.  2. Right ventricular systolic function is normal. The right ventricular  size is normal. Tricuspid regurgitation signal is inadequate for assessing  PA pressure.  3. The mitral valve is grossly normal. No evidence of mitral valve  regurgitation. No evidence of mitral stenosis.  4. The aortic valve is tricuspid. There is mild calcification of the  aortic valve. Aortic valve regurgitation is not visualized. No aortic  stenosis is present.  5. The inferior vena cava is normal in size with greater than 50%  respiratory variability, suggesting right atrial pressure of 3 mmHg.   Conclusion(s)/Recommendation(s): Normal biventricular function without  evidence of hemodynamically significant valvular heart disease.    Recent Labs: 02/05/2020: BUN 11; Creatinine, Ser 0.86; Potassium 4.2; Sodium 139 02/24/2020: Hemoglobin 12.9; Platelets 181  Recent Lipid Panel    Component Value Date/Time   CHOL 171 02/24/2020 1542   TRIG 79 02/24/2020 1542   HDL 66 02/24/2020 1542   CHOLHDL 2.6 02/24/2020 1542   LDLCALC 90 02/24/2020 1542      Physical Exam:    VS:  BP 110/82   Pulse 99   Ht 5' 11.75" (1.822 m)   Wt 214 lb 12.8  oz (97.4 kg)   SpO2 95%   BMI 29.34 kg/m     Wt Readings from Last 3 Encounters:  03/01/20 220 lb 8 oz (100 kg)  03/01/20 214 lb 12.8 oz (97.4 kg)  02/24/20 218 lb (98.9 kg)     GEN:  Well nourished, well developed in no acute distress HEENT: Normal NECK: No JVD; No carotid bruits CARDIAC: RRR, no murmurs, rubs, gallops RESPIRATORY:  Clear to auscultation without rales, wheezing or rhonchi  ABDOMEN: Soft, non-tender, non-distended MUSCULOSKELETAL:  No edema; No deformity  SKIN: Warm and dry NEUROLOGIC:  Alert and oriented x 3 PSYCHIATRIC:  Normal affect   ASSESSMENT:    1. Primary hypertension   2. Pure hypercholesterolemia   3. Localized enlarged lymph nodes   4. Coronary artery disease involving native coronary artery of native heart without angina pectoris   5. Hyperlipidemia, unspecified hyperlipidemia type    PLAN:    In order of problems listed above:  #Extensive Thoracic Adenopathy: Incidentally  discovered while underoing coronary CTA to work-up SOB. Highly concerning for lymphoproliferative disease. Will need thoracic oncology follow-up. -Refer to thoracic oncology--will call again today to help facilitate appointment -Likely needs PET and tissue sampling -Follow-up with Pulmonary as scheduled  #Moderate non-obstructive disease: Patient with worsening SOB with exertion for which coronary CTA performed. Noted to have moderate non-obstructive disease. TTE with normal biventricular function with no significant valvular pathology. Possibly symptoms related to lymphoproliferative process as detailed above. Will continue aggressive risk factor modification of underlying CAD. -Continue ASA 81mg  daily -Had itching with crestor-->changed to atorvastatin 40mg  daily -Continue losartan 25mg  today -Management of adenopathy as above  #HTN: Much better controlled today. Ranging 110-120s at home. -Continue amlodipine 10mg  daily -Continue losartan 25mg  -Will continue to  monitor blood pressure  #LE Pain: Mainly occurs at night when laying down. No exertional symptoms or wounds. Extremities warm with palpable PTs; dimished Dps. Do not suspect significant PAD, however, if symptoms persist or become exertional, will check ABIs. -If symptoms persist or become exertional, will check ABIs -Continue ASA and statin as above -Recommend Mag  #HLD: LDL 82, HDL 51, TG 206, TC 167 -Continue Lipitor 40mg  daily -Repeat lipid panel at next visit     Medication Adjustments/Labs and Tests Ordered: Current medicines are reviewed at length with the patient today.  Concerns regarding medicines are outlined above.  No orders of the defined types were placed in this encounter.  No orders of the defined types were placed in this encounter.   Patient Instructions  Medication Instructions:  Your provider recommends that you continue on your current medications as directed. Please refer to the Current Medication list given to you today.   *If you need a refill on your cardiac medications before your next appointment, please call your pharmacy*   Follow-Up: At [facility], you and your health needs are our priority.  As part of our continuing mission to provide you with exceptional heart care, we have created designated Provider Care Teams.  These Care Teams include your primary Cardiologist (physician) and Advanced Practice Providers (APPs -  Physician Assistants and Nurse Practitioners) who all work together to provide you with the care you need, when you need it. Your next appointment:   6 month(s) The format for your next appointment:   In Person Provider:   You may see Freada Bergeron, MD or one of the following Advanced Practice Providers on your designated Care Team:    Richardson Dopp, PA-C  Robbie Lis, [place]      Signed, Freada Bergeron, MD  03/01/2020 2:20 PM    [place]

## 2020-02-25 NOTE — Telephone Encounter (Signed)
-----   Message from Freada Bergeron, MD sent at 02/25/2020  3:41 PM EST ----- His cholesterol is still a little above goal with LDL 90 (goal is <70). Hopefully the new medication, atorvastatin, will help get him to where we need him. We can repeat lipids in 6 weeks on this new medicaiton. His hemoglobin looks better than prior although he is still slightly anemic. We just need to make sure he has follow-up with the oncologist as scheduled.

## 2020-02-25 NOTE — Telephone Encounter (Signed)
Left message to call office

## 2020-02-26 NOTE — Telephone Encounter (Signed)
Patient is returning call to discuss his lab results. 

## 2020-02-26 NOTE — Telephone Encounter (Signed)
The patient has been notified of the result and verbalized understanding.   Fasting lipids scheduled for 04/05/2020, pt made aware that he would need to fast and he agreed. Oncology placed at last OV, patient awaiting call to schedule actual appt.   All questions (if any) were answered. Edman Circle, RN 02/26/2020 9:54 AM

## 2020-03-01 ENCOUNTER — Encounter: Payer: Self-pay | Admitting: Pulmonary Disease

## 2020-03-01 ENCOUNTER — Other Ambulatory Visit: Payer: Medicare HMO

## 2020-03-01 ENCOUNTER — Ambulatory Visit (INDEPENDENT_AMBULATORY_CARE_PROVIDER_SITE_OTHER): Payer: Medicare HMO | Admitting: Pulmonary Disease

## 2020-03-01 ENCOUNTER — Encounter: Payer: Self-pay | Admitting: Cardiology

## 2020-03-01 ENCOUNTER — Telehealth: Payer: Self-pay | Admitting: Pulmonary Disease

## 2020-03-01 ENCOUNTER — Other Ambulatory Visit: Payer: Self-pay

## 2020-03-01 ENCOUNTER — Ambulatory Visit: Payer: Medicare HMO | Admitting: Cardiology

## 2020-03-01 VITALS — BP 110/82 | HR 99 | Ht 71.75 in | Wt 214.8 lb

## 2020-03-01 VITALS — BP 118/60 | HR 94 | Temp 97.6°F | Ht 71.75 in | Wt 220.5 lb

## 2020-03-01 DIAGNOSIS — E7800 Pure hypercholesterolemia, unspecified: Secondary | ICD-10-CM | POA: Diagnosis not present

## 2020-03-01 DIAGNOSIS — I2510 Atherosclerotic heart disease of native coronary artery without angina pectoris: Secondary | ICD-10-CM

## 2020-03-01 DIAGNOSIS — R590 Localized enlarged lymph nodes: Secondary | ICD-10-CM

## 2020-03-01 DIAGNOSIS — R599 Enlarged lymph nodes, unspecified: Secondary | ICD-10-CM | POA: Diagnosis not present

## 2020-03-01 DIAGNOSIS — I10 Essential (primary) hypertension: Secondary | ICD-10-CM

## 2020-03-01 DIAGNOSIS — Z87891 Personal history of nicotine dependence: Secondary | ICD-10-CM | POA: Diagnosis not present

## 2020-03-01 DIAGNOSIS — E785 Hyperlipidemia, unspecified: Secondary | ICD-10-CM

## 2020-03-01 DIAGNOSIS — R918 Other nonspecific abnormal finding of lung field: Secondary | ICD-10-CM

## 2020-03-01 NOTE — Telephone Encounter (Signed)
Pt has been scheduled for PET scan on 2/7, covid test on 2/8 and EBUS on 2/11 at 9:15 at [facility].  I gave appt info to pt's wife.

## 2020-03-01 NOTE — H&P (View-Only) (Signed)
Synopsis: Referred in January 2022 for abnormal CT chest by [name], MD  Subjective:   PATIENT ID: Joseph Moyer GENDER: male DOB: 06-21-1940, MRN: [id number]  Chief Complaint  Patient presents with  . Consult    Ct scan done and pt has localized lymph nodes    [name] past medical history of allergies, arthritis, hyperlipidemia, hypertension family history of diabetes.  Patient has no significant respiratory complaints at this time.  Abnormal imaging was completed.  Patient had coronary CT flow imaging which led to a CT scan of the chest.  This revealed bilateral adenopathy within the mediastinum.  The precarinal node at 1.8 cm.  There was right hilar adenopathy present mild centrilobular emphysema.  Additionally there was some adenopathy within the axilla.  Denies fevers chills night sweats weight loss.    Past Medical History:  Diagnosis Date  . Allergy   . Arthritis   . Hyperlipidemia   . Hypertension      Family History  Problem Relation Age of Onset  . Diabetes Mother   . Diabetes Sister   . Diabetes Brother      Past Surgical History:  Procedure Laterality Date  . CIRCUMCISION    . COLONOSCOPY WITH PROPOFOL N/A 05/10/2015   Procedure: COLONOSCOPY WITH PROPOFOL;  Surgeon: Garlan Fair, MD;  Location: [facility];  Service: Endoscopy;  Laterality: N/A;  . DECOMPRESSIVE LUMBAR LAMINECTOMY LEVEL 1 N/A 06/15/2018   Procedure: Gordy Levan decompression L1-L2/ complete inferior facetectomyof L1. Posterior lateral arthrodesiswith autograft bone L1-L2.;  Surgeon: Melina Schools, MD;  Location: [facility];  Service: Orthopedics;  Laterality: N/A;  . HEMORRHOID SURGERY      Social History   Socioeconomic History  . Marital status: Married    Spouse name: Not on file  . Number of children: Not on file  . Years of education: Not on file  . Highest education level: Not on file  Occupational History  . Not on file  Tobacco Use  . Smoking  status: Former Smoker    Quit date: 1994    Years since quitting: 28.1  . Smokeless tobacco: Never Used  Substance and Sexual Activity  . Alcohol use: Yes    Alcohol/week: 0.0 standard drinks    Comment: occassionally  . Drug use: No  . Sexual activity: Not on file  Other Topics Concern  . Not on file  Social History Narrative  . Not on file   Social Determinants of Health   Financial Resource Strain: Not on file  Food Insecurity: Not on file  Transportation Needs: Not on file  Physical Activity: Not on file  Stress: Not on file  Social Connections: Not on file  Intimate Partner Violence: Not on file     Allergies  Allergen Reactions  . Oxycodone Itching  . Ramipril     Swelling under eyes  . Rosuvastatin Itching    Pt reports this medication causes him to itch.      Outpatient Medications Prior to Visit  Medication Sig Dispense Refill  . amLODipine (NORVASC) 10 MG tablet Take 1 tablet (10 mg total) by mouth daily. 90 tablet 3  . aspirin 81 MG tablet Take 81 mg by mouth every morning.     Marland Kitchen atorvastatin (LIPITOR) 40 MG tablet Take 1 tablet (40 mg total) by mouth daily. 90 tablet 3  . losartan (COZAAR) 25 MG tablet Take 1 tablet (25 mg total) by mouth daily. 90 tablet 3  . meloxicam (  MOBIC) 15 MG tablet Take 15 mg by mouth daily.    . Naproxen Sodium (ALEVE PO) Take 1 tablet by mouth daily as needed (for pain).     No facility-administered medications prior to visit.    Review of Systems  Constitutional: Negative for chills, fever, malaise/fatigue and weight loss.  HENT: Negative for hearing loss, sore throat and tinnitus.   Eyes: Negative for blurred vision and double vision.  Respiratory: Negative for cough, hemoptysis, sputum production, shortness of breath, wheezing and stridor.   Cardiovascular: Negative for chest pain, palpitations, orthopnea, leg swelling and PND.  Gastrointestinal: Negative for abdominal pain, constipation, diarrhea, heartburn, nausea and  vomiting.  Genitourinary: Negative for dysuria, hematuria and urgency.  Musculoskeletal: Negative for joint pain and myalgias.  Skin: Negative for itching and rash.  Neurological: Negative for dizziness, tingling, weakness and headaches.  Endo/Heme/Allergies: Negative for environmental allergies. Does not bruise/bleed easily.  Psychiatric/Behavioral: Negative for depression. The patient is not nervous/anxious and does not have insomnia.   All other systems reviewed and are negative.    Objective:  Physical Exam Vitals reviewed.  Constitutional:      General: He is not in acute distress.    Appearance: He is well-developed and well-nourished.  HENT:     Head: Normocephalic and atraumatic.     Mouth/Throat:     Mouth: Oropharynx is clear and moist.  Eyes:     General: No scleral icterus.    Conjunctiva/sclera: Conjunctivae normal.     Pupils: Pupils are equal, round, and reactive to light.  Neck:     Vascular: No JVD.     Trachea: No tracheal deviation.  Cardiovascular:     Rate and Rhythm: Normal rate and regular rhythm.     Pulses: Intact distal pulses.     Heart sounds: Normal heart sounds. No murmur heard.   Pulmonary:     Effort: Pulmonary effort is normal. No tachypnea, accessory muscle usage or respiratory distress.     Breath sounds: No stridor. No wheezing, rhonchi or rales.  Abdominal:     General: Bowel sounds are normal. There is no distension.     Palpations: Abdomen is soft.     Tenderness: There is no abdominal tenderness.  Musculoskeletal:        General: No tenderness or edema.     Cervical back: Neck supple.  Lymphadenopathy:     Cervical: No cervical adenopathy.  Skin:    General: Skin is warm and dry.     Capillary Refill: Capillary refill takes less than 2 seconds.     Findings: No rash.  Neurological:     Mental Status: He is alert and oriented to person, place, and time.  Psychiatric:        Mood and Affect: Mood and affect normal.         Behavior: Behavior normal.      Vitals:   03/01/20 1125  BP: 118/60  Pulse: 94  Temp: 97.6 F (36.4 C)  TempSrc: Tympanic  SpO2: 96%  Weight: 220 lb 8 oz (100 kg)  Height: 5' 11.75" (1.822 m)   96% on RA BMI Readings from Last 3 Encounters:  03/01/20 30.11 kg/m  03/01/20 29.34 kg/m  02/24/20 30.40 kg/m   Wt Readings from Last 3 Encounters:  03/01/20 220 lb 8 oz (100 kg)  03/01/20 214 lb 12.8 oz (97.4 kg)  02/24/20 218 lb (98.9 kg)     CBC    Component Value Date/Time   WBC  8.5 02/24/2020 1542   WBC 5.0 06/26/2018 0532   RBC 4.53 02/24/2020 1542   RBC 3.27 (L) 06/26/2018 0532   HGB 12.9 (L) 02/24/2020 1542   HCT 39.6 02/24/2020 1542   PLT 181 02/24/2020 1542   MCV 87 02/24/2020 1542   MCH 28.5 02/24/2020 1542   MCH 28.4 06/26/2018 0532   MCHC 32.6 02/24/2020 1542   MCHC 32.2 06/26/2018 0532   RDW 15.6 (H) 02/24/2020 1542   LYMPHSABS 5.3 (H) 02/24/2020 1542   MONOABS 0.4 06/18/2018 0514   EOSABS 0.1 02/24/2020 1542   BASOSABS 0.0 02/24/2020 1542    Chest Imaging: 02/19/2020: CT chest Extensive thoracic adenopathy that was initially found on coronary CT concerning for potential lymphoproliferative disorder underlying malignancy.  Images reviewed today in the office. The patient's images have been independently reviewed by me.    Pulmonary Functions Testing Results: No flowsheet data found.  FeNO: none  Pathology: no  Echocardiogram: none  Heart Catheterization: none     Assessment & Plan:     ICD-10-CM   1. Adenopathy  R59.9 Ambulatory referral to Pulmonology    NM PET Image Initial (PI) Skull Base To Thigh  2. Abnormal CT scan of lung  R91.8 Ambulatory referral to Pulmonology    NM PET Image Initial (PI) Skull Base To Thigh  3. Localized enlarged lymph nodes  R59.0 NM PET Image Initial (PI) Skull Base To Thigh  4. Former smoker  Z87.891   5. Mediastinal adenopathy  R59.0     Discussion:  [name], presents with  adenopathy of the chest and abnormal CT imaging. This potentially could represent a lymphoproliferative disorder, malignancy not excluded such as lymphoma.  Additional differential diagnosis would include sarcoidosis.  No prior family history of inflammatory disorder such as these.  Plan: Expect steps to include video bronchoscopy with endobronchial ultrasound and transbronchial needle aspiration. If the lymph nodes are negative and we have a concerning PET scan may need to consider excisional nodal biopsy. We will address this pending results of bronchoscopy. We discussed this today in the office. I have also ordered the PET scan to be completed.  Tentative date for bronchoscopy on 03/12/2020. Nothing to eat the night before past midnight. We discussed the risk of bleeding, pneumothorax with bronchoscopy as well as issues or concerns regarding anesthesia.  Patient is agreeable to proceed.   Current Outpatient Medications:  .  amLODipine (NORVASC) 10 MG tablet, Take 1 tablet (10 mg total) by mouth daily., Disp: 90 tablet, Rfl: 3 .  aspirin 81 MG tablet, Take 81 mg by mouth every morning. , Disp: , Rfl:  .  atorvastatin (LIPITOR) 40 MG tablet, Take 1 tablet (40 mg total) by mouth daily., Disp: 90 tablet, Rfl: 3 .  losartan (COZAAR) 25 MG tablet, Take 1 tablet (25 mg total) by mouth daily., Disp: 90 tablet, Rfl: 3 .  meloxicam (MOBIC) 15 MG tablet, Take 15 mg by mouth daily., Disp: , Rfl:  .  Naproxen Sodium (ALEVE PO), Take 1 tablet by mouth daily as needed (for pain)., Disp: , Rfl:   I spent 61 minutes dedicated to the care of this patient on the date of this encounter to include pre-visit review of records, face-to-face time with the patient discussing conditions above, post visit ordering of testing, clinical documentation with the electronic health record, making appropriate referrals as documented, and communicating necessary findings to members of the patients care team.   Garner Nash, DO [place]  Pulmonary Critical Care 03/01/2020 11:51 AM

## 2020-03-01 NOTE — Patient Instructions (Addendum)
Thank you for visiting Dr. Valeta Harms at [facility]. Today we recommend the following:  Orders Placed This Encounter  Procedures  . NM PET Image Initial (PI) Skull Base To Thigh  . Ambulatory referral to Pulmonology   Bronchoscopy scheduled for 03/12/2020 [facility] Nothing to eat the night before past midnight Expect calls from perioperative services and scheduling regarding times.  Return in about 3 weeks (around 03/22/2020) for with APP or Dr. Valeta Harms. to review bronch results.     Please do your part to reduce the spread of COVID-19.

## 2020-03-01 NOTE — Progress Notes (Signed)
Synopsis: Referred in January 2022 for abnormal CT chest by [name], MD  Subjective:   PATIENT ID: Joseph Moyer GENDER: male DOB: 01/04/1941, MRN: [id number]  Chief Complaint  Patient presents with  . Consult    Ct scan done and pt has localized lymph nodes    [name] past medical history of allergies, arthritis, hyperlipidemia, hypertension family history of diabetes.  Patient has no significant respiratory complaints at this time.  Abnormal imaging was completed.  Patient had coronary CT flow imaging which led to a CT scan of the chest.  This revealed bilateral adenopathy within the mediastinum.  The precarinal node at 1.8 cm.  There was right hilar adenopathy present mild centrilobular emphysema.  Additionally there was some adenopathy within the axilla.  Denies fevers chills night sweats weight loss.    Past Medical History:  Diagnosis Date  . Allergy   . Arthritis   . Hyperlipidemia   . Hypertension      Family History  Problem Relation Age of Onset  . Diabetes Mother   . Diabetes Sister   . Diabetes Brother      Past Surgical History:  Procedure Laterality Date  . CIRCUMCISION    . COLONOSCOPY WITH PROPOFOL N/A 05/10/2015   Procedure: COLONOSCOPY WITH PROPOFOL;  Surgeon: Garlan Fair, MD;  Location: [facility];  Service: Endoscopy;  Laterality: N/A;  . DECOMPRESSIVE LUMBAR LAMINECTOMY LEVEL 1 N/A 06/15/2018   Procedure: Gordy Levan decompression L1-L2/ complete inferior facetectomyof L1. Posterior lateral arthrodesiswith autograft bone L1-L2.;  Surgeon: Melina Schools, MD;  Location: [facility];  Service: Orthopedics;  Laterality: N/A;  . HEMORRHOID SURGERY      Social History   Socioeconomic History  . Marital status: Married    Spouse name: Not on file  . Number of children: Not on file  . Years of education: Not on file  . Highest education level: Not on file  Occupational History  . Not on file  Tobacco Use  . Smoking  status: Former Smoker    Quit date: 1994    Years since quitting: 28.1  . Smokeless tobacco: Never Used  Substance and Sexual Activity  . Alcohol use: Yes    Alcohol/week: 0.0 standard drinks    Comment: occassionally  . Drug use: No  . Sexual activity: Not on file  Other Topics Concern  . Not on file  Social History Narrative  . Not on file   Social Determinants of Health   Financial Resource Strain: Not on file  Food Insecurity: Not on file  Transportation Needs: Not on file  Physical Activity: Not on file  Stress: Not on file  Social Connections: Not on file  Intimate Partner Violence: Not on file     Allergies  Allergen Reactions  . Oxycodone Itching  . Ramipril     Swelling under eyes  . Rosuvastatin Itching    Pt reports this medication causes him to itch.      Outpatient Medications Prior to Visit  Medication Sig Dispense Refill  . amLODipine (NORVASC) 10 MG tablet Take 1 tablet (10 mg total) by mouth daily. 90 tablet 3  . aspirin 81 MG tablet Take 81 mg by mouth every morning.     Marland Kitchen atorvastatin (LIPITOR) 40 MG tablet Take 1 tablet (40 mg total) by mouth daily. 90 tablet 3  . losartan (COZAAR) 25 MG tablet Take 1 tablet (25 mg total) by mouth daily. 90 tablet 3  . meloxicam (  MOBIC) 15 MG tablet Take 15 mg by mouth daily.    . Naproxen Sodium (ALEVE PO) Take 1 tablet by mouth daily as needed (for pain).     No facility-administered medications prior to visit.    Review of Systems  Constitutional: Negative for chills, fever, malaise/fatigue and weight loss.  HENT: Negative for hearing loss, sore throat and tinnitus.   Eyes: Negative for blurred vision and double vision.  Respiratory: Negative for cough, hemoptysis, sputum production, shortness of breath, wheezing and stridor.   Cardiovascular: Negative for chest pain, palpitations, orthopnea, leg swelling and PND.  Gastrointestinal: Negative for abdominal pain, constipation, diarrhea, heartburn, nausea and  vomiting.  Genitourinary: Negative for dysuria, hematuria and urgency.  Musculoskeletal: Negative for joint pain and myalgias.  Skin: Negative for itching and rash.  Neurological: Negative for dizziness, tingling, weakness and headaches.  Endo/Heme/Allergies: Negative for environmental allergies. Does not bruise/bleed easily.  Psychiatric/Behavioral: Negative for depression. The patient is not nervous/anxious and does not have insomnia.   All other systems reviewed and are negative.    Objective:  Physical Exam Vitals reviewed.  Constitutional:      General: He is not in acute distress.    Appearance: He is well-developed and well-nourished.  HENT:     Head: Normocephalic and atraumatic.     Mouth/Throat:     Mouth: Oropharynx is clear and moist.  Eyes:     General: No scleral icterus.    Conjunctiva/sclera: Conjunctivae normal.     Pupils: Pupils are equal, round, and reactive to light.  Neck:     Vascular: No JVD.     Trachea: No tracheal deviation.  Cardiovascular:     Rate and Rhythm: Normal rate and regular rhythm.     Pulses: Intact distal pulses.     Heart sounds: Normal heart sounds. No murmur heard.   Pulmonary:     Effort: Pulmonary effort is normal. No tachypnea, accessory muscle usage or respiratory distress.     Breath sounds: No stridor. No wheezing, rhonchi or rales.  Abdominal:     General: Bowel sounds are normal. There is no distension.     Palpations: Abdomen is soft.     Tenderness: There is no abdominal tenderness.  Musculoskeletal:        General: No tenderness or edema.     Cervical back: Neck supple.  Lymphadenopathy:     Cervical: No cervical adenopathy.  Skin:    General: Skin is warm and dry.     Capillary Refill: Capillary refill takes less than 2 seconds.     Findings: No rash.  Neurological:     Mental Status: He is alert and oriented to person, place, and time.  Psychiatric:        Mood and Affect: Mood and affect normal.         Behavior: Behavior normal.      Vitals:   03/01/20 1125  BP: 118/60  Pulse: 94  Temp: 97.6 F (36.4 C)  TempSrc: Tympanic  SpO2: 96%  Weight: 220 lb 8 oz (100 kg)  Height: 5' 11.75" (1.822 m)   96% on RA BMI Readings from Last 3 Encounters:  03/01/20 30.11 kg/m  03/01/20 29.34 kg/m  02/24/20 30.40 kg/m   Wt Readings from Last 3 Encounters:  03/01/20 220 lb 8 oz (100 kg)  03/01/20 214 lb 12.8 oz (97.4 kg)  02/24/20 218 lb (98.9 kg)     CBC    Component Value Date/Time   WBC  8.5 02/24/2020 1542   WBC 5.0 06/26/2018 0532   RBC 4.53 02/24/2020 1542   RBC 3.27 (L) 06/26/2018 0532   HGB 12.9 (L) 02/24/2020 1542   HCT 39.6 02/24/2020 1542   PLT 181 02/24/2020 1542   MCV 87 02/24/2020 1542   MCH 28.5 02/24/2020 1542   MCH 28.4 06/26/2018 0532   MCHC 32.6 02/24/2020 1542   MCHC 32.2 06/26/2018 0532   RDW 15.6 (H) 02/24/2020 1542   LYMPHSABS 5.3 (H) 02/24/2020 1542   MONOABS 0.4 06/18/2018 0514   EOSABS 0.1 02/24/2020 1542   BASOSABS 0.0 02/24/2020 1542    Chest Imaging: 02/19/2020: CT chest Extensive thoracic adenopathy that was initially found on coronary CT concerning for potential lymphoproliferative disorder underlying malignancy.  Images reviewed today in the office. The patient's images have been independently reviewed by me.    Pulmonary Functions Testing Results: No flowsheet data found.  FeNO: none  Pathology: no  Echocardiogram: none  Heart Catheterization: none     Assessment & Plan:     ICD-10-CM   1. Adenopathy  R59.9 Ambulatory referral to Pulmonology    NM PET Image Initial (PI) Skull Base To Thigh  2. Abnormal CT scan of lung  R91.8 Ambulatory referral to Pulmonology    NM PET Image Initial (PI) Skull Base To Thigh  3. Localized enlarged lymph nodes  R59.0 NM PET Image Initial (PI) Skull Base To Thigh  4. Former smoker  Z87.891   5. Mediastinal adenopathy  R59.0     Discussion:  [name], presents with  adenopathy of the chest and abnormal CT imaging. This potentially could represent a lymphoproliferative disorder, malignancy not excluded such as lymphoma.  Additional differential diagnosis would include sarcoidosis.  No prior family history of inflammatory disorder such as these.  Plan: Expect steps to include video bronchoscopy with endobronchial ultrasound and transbronchial needle aspiration. If the lymph nodes are negative and we have a concerning PET scan may need to consider excisional nodal biopsy. We will address this pending results of bronchoscopy. We discussed this today in the office. I have also ordered the PET scan to be completed.  Tentative date for bronchoscopy on 03/12/2020. Nothing to eat the night before past midnight. We discussed the risk of bleeding, pneumothorax with bronchoscopy as well as issues or concerns regarding anesthesia.  Patient is agreeable to proceed.   Current Outpatient Medications:  .  amLODipine (NORVASC) 10 MG tablet, Take 1 tablet (10 mg total) by mouth daily., Disp: 90 tablet, Rfl: 3 .  aspirin 81 MG tablet, Take 81 mg by mouth every morning. , Disp: , Rfl:  .  atorvastatin (LIPITOR) 40 MG tablet, Take 1 tablet (40 mg total) by mouth daily., Disp: 90 tablet, Rfl: 3 .  losartan (COZAAR) 25 MG tablet, Take 1 tablet (25 mg total) by mouth daily., Disp: 90 tablet, Rfl: 3 .  meloxicam (MOBIC) 15 MG tablet, Take 15 mg by mouth daily., Disp: , Rfl:  .  Naproxen Sodium (ALEVE PO), Take 1 tablet by mouth daily as needed (for pain)., Disp: , Rfl:   I spent 61 minutes dedicated to the care of this patient on the date of this encounter to include pre-visit review of records, face-to-face time with the patient discussing conditions above, post visit ordering of testing, clinical documentation with the electronic health record, making appropriate referrals as documented, and communicating necessary findings to members of the patients care team.   Garner Nash, DO   Pulmonary Critical Care 03/01/2020 11:51 AM

## 2020-03-01 NOTE — Patient Instructions (Signed)
Medication Instructions:  Your provider recommends that you continue on your current medications as directed. Please refer to the Current Medication list given to you today.   *If you need a refill on your cardiac medications before your next appointment, please call your pharmacy*   Follow-Up: At [facility], you and your health needs are our priority.  As part of our continuing mission to provide you with exceptional heart care, we have created designated Provider Care Teams.  These Care Teams include your primary Cardiologist (physician) and Advanced Practice Providers (APPs -  Physician Assistants and Nurse Practitioners) who all work together to provide you with the care you need, when you need it. Your next appointment:   6 month(s) The format for your next appointment:   In Person Provider:   You may see Freada Bergeron, MD or one of the following Advanced Practice Providers on your designated Care Team:    Richardson Dopp, PA-C  [place], [place]

## 2020-03-02 ENCOUNTER — Telehealth: Payer: Self-pay | Admitting: Pulmonary Disease

## 2020-03-02 NOTE — Telephone Encounter (Signed)
Spoke to pt & rescheduled his covid test for later in the day.  Nothing further needed.

## 2020-03-02 NOTE — Telephone Encounter (Signed)
PET scans are not scheduled after 3:00.  I called pt to let him know this and to see if I can reschedule his covid test for a later time.  Had to leave him a vm to call me back.

## 2020-03-02 NOTE — Telephone Encounter (Signed)
I have attempted to call the pt but his phone keeps cutting in and out.  The message that was left for him on his phone was just to verify his appt with BI on 1/31.  Nothing further is needed.

## 2020-03-02 NOTE — Telephone Encounter (Signed)
Called and spoke to pt. Informed him of the information from the earlier phone note per Mignon Pine. Pt verbalized understanding but had questions regarding his upcoming procedure and scan. Answered pts questions. Pt is requesting to have the COVID test and PET scan scheduled after 3pm as pt has work.   Will forward to PCC's to see if this can be changed. Thanks.

## 2020-03-03 ENCOUNTER — Ambulatory Visit: Payer: Medicare HMO | Admitting: Cardiology

## 2020-03-03 ENCOUNTER — Telehealth: Payer: Self-pay | Admitting: Pulmonary Disease

## 2020-03-03 NOTE — Telephone Encounter (Signed)
Attempted to contact pt. Left message for pt to call back.  

## 2020-03-05 ENCOUNTER — Telehealth: Payer: Self-pay | Admitting: Pulmonary Disease

## 2020-03-05 NOTE — Telephone Encounter (Signed)
Pt is scheduled for a video bronch with biopsy 03/12/20.  Called and spoke with pt letting him know that due to anesthesia involved, he will not be able to return to work same day after procedure. Pt verbalized understanding. Nothing further needed.

## 2020-03-05 NOTE — Telephone Encounter (Signed)
Patient is returning phone cal. Patient phone number is [phone] or [phone] h. May leave detailed message on voicemail.

## 2020-03-08 ENCOUNTER — Ambulatory Visit (HOSPITAL_COMMUNITY)
Admission: RE | Admit: 2020-03-08 | Discharge: 2020-03-08 | Disposition: A | Discharge status: Home / Self Care | Payer: Medicare HMO | Source: Ambulatory Visit | Attending: Pulmonary Disease | Admitting: Pulmonary Disease

## 2020-03-08 ENCOUNTER — Other Ambulatory Visit: Payer: Self-pay

## 2020-03-08 DIAGNOSIS — R918 Other nonspecific abnormal finding of lung field: Secondary | ICD-10-CM | POA: Insufficient documentation

## 2020-03-08 DIAGNOSIS — R590 Localized enlarged lymph nodes: Secondary | ICD-10-CM | POA: Insufficient documentation

## 2020-03-08 DIAGNOSIS — R599 Enlarged lymph nodes, unspecified: Secondary | ICD-10-CM | POA: Diagnosis not present

## 2020-03-08 DIAGNOSIS — K8020 Calculus of gallbladder without cholecystitis without obstruction: Secondary | ICD-10-CM | POA: Diagnosis not present

## 2020-03-08 DIAGNOSIS — I2510 Atherosclerotic heart disease of native coronary artery without angina pectoris: Secondary | ICD-10-CM | POA: Diagnosis not present

## 2020-03-08 DIAGNOSIS — K5730 Diverticulosis of large intestine without perforation or abscess without bleeding: Secondary | ICD-10-CM | POA: Diagnosis not present

## 2020-03-08 DIAGNOSIS — I700 Atherosclerosis of aorta: Secondary | ICD-10-CM | POA: Insufficient documentation

## 2020-03-08 LAB — GLUCOSE, CAPILLARY: Glucose-Capillary: 106 mg/dL — ABNORMAL HIGH (ref 70–99)

## 2020-03-08 MED ORDER — FLUDEOXYGLUCOSE F - 18 (FDG) INJECTION
11.2000 | Freq: Once | INTRAVENOUS | Status: AC | PRN
  Administered 2020-03-08: 11.2 via INTRAVENOUS

## 2020-03-08 NOTE — Telephone Encounter (Signed)
I placed the orders in for [employer] ins and it states no precert req for the EBUS I then spoke to the wife and notified her my findings I left my # for the pt to call if he has any questions Joellen [place]

## 2020-03-08 NOTE — Telephone Encounter (Signed)
I am working on precert [name] ° °

## 2020-03-09 ENCOUNTER — Other Ambulatory Visit (HOSPITAL_COMMUNITY): Payer: Medicare HMO

## 2020-03-09 ENCOUNTER — Other Ambulatory Visit (HOSPITAL_COMMUNITY)
Admission: RE | Admit: 2020-03-09 | Discharge: 2020-03-09 | Disposition: A | Discharge status: Home / Self Care | Payer: Medicare HMO | Source: Ambulatory Visit | Attending: Pulmonary Disease | Admitting: Pulmonary Disease

## 2020-03-09 DIAGNOSIS — Z01812 Encounter for preprocedural laboratory examination: Secondary | ICD-10-CM | POA: Insufficient documentation

## 2020-03-09 DIAGNOSIS — Z20822 Contact with and (suspected) exposure to covid-19: Secondary | ICD-10-CM | POA: Insufficient documentation

## 2020-03-09 LAB — SARS CORONAVIRUS 2 (TAT 6-24 HRS): SARS Coronavirus 2: NEGATIVE

## 2020-03-11 ENCOUNTER — Telehealth: Payer: Self-pay | Admitting: Pulmonary Disease

## 2020-03-11 ENCOUNTER — Other Ambulatory Visit: Payer: Self-pay

## 2020-03-11 ENCOUNTER — Institutional Professional Consult (permissible substitution): Payer: Medicare HMO | Admitting: Pulmonary Disease

## 2020-03-11 ENCOUNTER — Encounter (HOSPITAL_COMMUNITY): Payer: Self-pay | Admitting: Pulmonary Disease

## 2020-03-11 NOTE — Telephone Encounter (Signed)
Called and spoke to pt. Informed pt that he should be getting a call today from short stay regarding his instructions prior to his ENB tomorrow. Advised pt to call back today if he hasnt heard from anyone by 4pm. Pt verbalized understanding and denied any further questions or concerns at this time.

## 2020-03-12 ENCOUNTER — Telehealth: Payer: Self-pay | Admitting: Pulmonary Disease

## 2020-03-12 ENCOUNTER — Encounter (HOSPITAL_COMMUNITY): Payer: Self-pay | Admitting: Pulmonary Disease

## 2020-03-12 ENCOUNTER — Other Ambulatory Visit: Payer: Self-pay

## 2020-03-12 ENCOUNTER — Telehealth: Payer: Self-pay | Admitting: *Deleted

## 2020-03-12 ENCOUNTER — Ambulatory Visit (HOSPITAL_COMMUNITY)
Admission: RE | Admit: 2020-03-12 | Discharge: 2020-03-12 | Disposition: A | Discharge status: Home / Self Care | Payer: Medicare HMO | Attending: Pulmonary Disease | Admitting: Pulmonary Disease

## 2020-03-12 ENCOUNTER — Encounter (HOSPITAL_COMMUNITY)
Admission: RE | Disposition: A | Discharge status: Home / Self Care | Payer: Self-pay | Source: Home / Self Care | Attending: Pulmonary Disease

## 2020-03-12 ENCOUNTER — Ambulatory Visit (HOSPITAL_COMMUNITY): Payer: Medicare HMO | Admitting: Anesthesiology

## 2020-03-12 DIAGNOSIS — Z885 Allergy status to narcotic agent status: Secondary | ICD-10-CM | POA: Diagnosis not present

## 2020-03-12 DIAGNOSIS — D360 Benign neoplasm of lymph nodes: Secondary | ICD-10-CM | POA: Diagnosis not present

## 2020-03-12 DIAGNOSIS — Z79899 Other long term (current) drug therapy: Secondary | ICD-10-CM | POA: Insufficient documentation

## 2020-03-12 DIAGNOSIS — R599 Enlarged lymph nodes, unspecified: Secondary | ICD-10-CM | POA: Diagnosis not present

## 2020-03-12 DIAGNOSIS — Z87891 Personal history of nicotine dependence: Secondary | ICD-10-CM | POA: Insufficient documentation

## 2020-03-12 DIAGNOSIS — E785 Hyperlipidemia, unspecified: Secondary | ICD-10-CM | POA: Insufficient documentation

## 2020-03-12 DIAGNOSIS — I10 Essential (primary) hypertension: Secondary | ICD-10-CM | POA: Diagnosis not present

## 2020-03-12 DIAGNOSIS — Z888 Allergy status to other drugs, medicaments and biological substances status: Secondary | ICD-10-CM | POA: Insufficient documentation

## 2020-03-12 DIAGNOSIS — Z7982 Long term (current) use of aspirin: Secondary | ICD-10-CM | POA: Diagnosis not present

## 2020-03-12 DIAGNOSIS — M5106 Intervertebral disc disorders with myelopathy, lumbar region: Secondary | ICD-10-CM | POA: Diagnosis not present

## 2020-03-12 DIAGNOSIS — R590 Localized enlarged lymph nodes: Secondary | ICD-10-CM | POA: Insufficient documentation

## 2020-03-12 DIAGNOSIS — R948 Abnormal results of function studies of other organs and systems: Secondary | ICD-10-CM

## 2020-03-12 DIAGNOSIS — R918 Other nonspecific abnormal finding of lung field: Secondary | ICD-10-CM

## 2020-03-12 HISTORY — PX: VIDEO BRONCHOSCOPY WITH ENDOBRONCHIAL ULTRASOUND: SHX6177

## 2020-03-12 HISTORY — DX: Gastro-esophageal reflux disease without esophagitis: K21.9

## 2020-03-12 HISTORY — PX: BRONCHIAL NEEDLE ASPIRATION BIOPSY: SHX5106

## 2020-03-12 LAB — BASIC METABOLIC PANEL
Anion gap: 10 (ref 5–15)
BUN: 22 mg/dL (ref 8–23)
CO2: 23 mmol/L (ref 22–32)
Calcium: 9 mg/dL (ref 8.9–10.3)
Chloride: 103 mmol/L (ref 98–111)
Creatinine, Ser: 0.94 mg/dL (ref 0.61–1.24)
GFR, Estimated: >60 mL/min (ref >60)
Glucose, Bld: 101 mg/dL — ABNORMAL HIGH (ref 70–99)
Potassium: 4.3 mmol/L (ref 3.5–5.1)
Sodium: 136 mmol/L (ref 135–145)

## 2020-03-12 SURGERY — BRONCHOSCOPY, WITH EBUS
Anesthesia: General

## 2020-03-12 MED ORDER — FENTANYL CITRATE (PF) 100 MCG/2ML IJ SOLN: 25.0000 ug | INTRAMUSCULAR | Status: DC | PRN

## 2020-03-12 MED ORDER — ONDANSETRON HCL 4 MG/2ML IJ SOLN
INTRAMUSCULAR | Status: DC | PRN
  Administered 2020-03-12: 4 mg via INTRAVENOUS

## 2020-03-12 MED ORDER — PROPOFOL 10 MG/ML IV BOLUS
INTRAVENOUS | Status: DC | PRN
  Administered 2020-03-12: 175 mg via INTRAVENOUS

## 2020-03-12 MED ORDER — LACTATED RINGERS IV SOLN: INTRAVENOUS | Status: DC

## 2020-03-12 MED ORDER — PHENYLEPHRINE 40 MCG/ML (10ML) SYRINGE FOR IV PUSH (FOR BLOOD PRESSURE SUPPORT)
PREFILLED_SYRINGE | INTRAVENOUS | Status: DC | PRN
  Administered 2020-03-12: 160 ug via INTRAVENOUS

## 2020-03-12 MED ORDER — LIDOCAINE 2% (20 MG/ML) 5 ML SYRINGE
INTRAMUSCULAR | Status: DC | PRN
  Administered 2020-03-12: 60 mg via INTRAVENOUS

## 2020-03-12 MED ORDER — ROCURONIUM BROMIDE 10 MG/ML (PF) SYRINGE
PREFILLED_SYRINGE | INTRAVENOUS | Status: DC | PRN
  Administered 2020-03-12: 50 mg via INTRAVENOUS

## 2020-03-12 MED ORDER — ONDANSETRON HCL 4 MG/2ML IJ SOLN: 4.0000 mg | Freq: Once | INTRAMUSCULAR | Status: DC | PRN

## 2020-03-12 MED ORDER — FENTANYL CITRATE (PF) 100 MCG/2ML IJ SOLN
INTRAMUSCULAR | Status: DC | PRN
  Administered 2020-03-12: 50 ug via INTRAVENOUS

## 2020-03-12 MED ORDER — SUGAMMADEX SODIUM 200 MG/2ML IV SOLN
INTRAVENOUS | Status: DC | PRN
  Administered 2020-03-12: 200 mg via INTRAVENOUS

## 2020-03-12 SURGICAL SUPPLY — 29 items
BRUSH CYTOL CELLEBRITY 1.5X140 (MISCELLANEOUS) IMPLANT
CANISTER SUCT 3000ML PPV (MISCELLANEOUS) ×3 IMPLANT
CONT SPEC 4OZ CLIKSEAL STRL BL (MISCELLANEOUS) ×3 IMPLANT
COVER BACK TABLE 60X90IN (DRAPES) ×3 IMPLANT
COVER DOME SNAP 22 D (MISCELLANEOUS) ×3 IMPLANT
FORCEPS BIOP RJ4 1.8 (CUTTING FORCEPS) IMPLANT
GAUZE SPONGE 4X4 12PLY STRL (GAUZE/BANDAGES/DRESSINGS) ×3 IMPLANT
GLOVE BIO SURGEON STRL SZ7.5 (GLOVE) ×3 IMPLANT
GOWN STRL REUS W/ TWL LRG LVL3 (GOWN DISPOSABLE) ×2 IMPLANT
GOWN STRL REUS W/TWL LRG LVL3 (GOWN DISPOSABLE) ×3
KIT CLEAN ENDO COMPLIANCE (KITS) ×6 IMPLANT
KIT TURNOVER KIT B (KITS) ×3 IMPLANT
MARKER SKIN DUAL TIP RULER LAB (MISCELLANEOUS) ×3 IMPLANT
NEEDLE EBUS SONO TIP PENTAX (NEEDLE) ×3 IMPLANT
NS IRRIG 1000ML POUR BTL (IV SOLUTION) ×3 IMPLANT
OIL SILICONE PENTAX (PARTS (SERVICE/REPAIRS)) ×3 IMPLANT
PAD ARMBOARD 7.5X6 YLW CONV (MISCELLANEOUS) ×6 IMPLANT
SOL ANTI FOG 6CC (MISCELLANEOUS) ×2 IMPLANT
SOLUTION ANTI FOG 6CC (MISCELLANEOUS) ×1
SYR 20CC LL (SYRINGE) ×6 IMPLANT
SYR 20ML ECCENTRIC (SYRINGE) ×6 IMPLANT
SYR 50ML SLIP (SYRINGE) IMPLANT
SYR 5ML LUER SLIP (SYRINGE) ×3 IMPLANT
TOWEL OR 17X24 6PK STRL BLUE (TOWEL DISPOSABLE) ×3 IMPLANT
TRAP SPECIMEN MUCOUS 40CC (MISCELLANEOUS) IMPLANT
TUBE CONNECTING 20X1/4 (TUBING) ×6 IMPLANT
UNDERPAD 30X30 (UNDERPADS AND DIAPERS) ×3 IMPLANT
VALVE DISPOSABLE (MISCELLANEOUS) ×3 IMPLANT
WATER STERILE IRR 1000ML POUR (IV SOLUTION) ×3 IMPLANT

## 2020-03-12 NOTE — Anesthesia Preprocedure Evaluation (Addendum)
Anesthesia Evaluation  Patient identified by MRN, date of birth, ID band Patient awake    Reviewed: Allergy & Precautions, NPO status , Patient's Chart, lab work & pertinent test results  Airway Mallampati: II  TM Distance: >3 FB Neck ROM: Full    Dental  (+) Teeth Intact, Dental Advisory Given   Pulmonary former smoker,    breath sounds clear to auscultation       Cardiovascular hypertension,  Rhythm:Regular Rate:Normal     Neuro/Psych    GI/Hepatic   Endo/Other    Renal/GU      Musculoskeletal   Abdominal (+) + obese,   Peds  Hematology   Anesthesia Other Findings   Reproductive/Obstetrics                            Anesthesia Physical Anesthesia Plan  ASA: III  Anesthesia Plan: General   Post-op Pain Management:    Induction: Intravenous  PONV Risk Score and Plan: Ondansetron and Dexamethasone  Airway Management Planned: Oral ETT  Additional Equipment:   Intra-op Plan:   Post-operative Plan: Extubation in OR  Informed Consent: I have reviewed the patients History and Physical, chart, labs and discussed the procedure including the risks, benefits and alternatives for the proposed anesthesia with the patient or authorized representative who has indicated his/her understanding and acceptance.     Dental advisory given  Plan Discussed with: CRNA and Anesthesiologist  Anesthesia Plan Comments:         Anesthesia Quick Evaluation

## 2020-03-12 NOTE — Interval H&P Note (Signed)
History and Physical Interval Note:  03/12/2020 7:15 AM  Joseph Moyer  has presented today for surgery, with the diagnosis of MEDIASTINAL ADENOPATHY.  The various methods of treatment have been discussed with the patient and family. After consideration of risks, benefits and other options for treatment, the patient has consented to  Procedure(s): [place] (N/A) as a surgical intervention.  The patient's history has been reviewed, patient examined, no change in status, stable for surgery.  I have reviewed the patient's chart and labs.  Questions were answered to the patient's satisfaction.     [place]

## 2020-03-12 NOTE — Anesthesia Procedure Notes (Signed)
Procedure Name: Intubation Date/Time: 03/12/2020 9:34 AM Performed by: Hoy Morn, CRNA Pre-anesthesia Checklist: Patient identified, Emergency Drugs available, Suction available and Patient being monitored Patient Re-evaluated:Patient Re-evaluated prior to induction Oxygen Delivery Method: Circle system utilized Preoxygenation: Pre-oxygenation with 100% oxygen Induction Type: IV induction Ventilation: Mask ventilation without difficulty Laryngoscope Size: Miller and 2 Grade View: Grade I Tube type: Oral Tube size: 8.5 mm Number of attempts: 1 Airway Equipment and Method: Stylet and Oral airway Placement Confirmation: ETT inserted through vocal cords under direct vision,  positive ETCO2 and breath sounds checked- equal and bilateral Tube secured with: Tape Dental Injury: Teeth and Oropharynx as per pre-operative assessment

## 2020-03-12 NOTE — Op Note (Signed)
Video Bronchoscopy with Endobronchial Ultrasound Procedure Note  Date of Operation: 03/12/2020  Pre-op Diagnosis: Mediastinal adenopathy  Post-op Diagnosis: Mediastinal adenopathy  Surgeon: Garner Nash, DO   Assistants: None   Anesthesia: General endotracheal anesthesia  Operation: Flexible video fiberoptic bronchoscopy with endobronchial ultrasound and biopsies.  Estimated Blood Loss: Minimal  Complications: None   Indications and History: Joseph Moyer is a 80 y.o. male with PET avid mediastinal adenopathy.  The risks, benefits, complications, treatment options and expected outcomes were discussed with the patient.  The possibilities of pneumothorax, pneumonia, reaction to medication, pulmonary aspiration, perforation of a viscus, bleeding, failure to diagnose a condition and creating a complication requiring transfusion or operation were discussed with the patient who freely signed the consent.    Description of Procedure: The patient was examined in the preoperative area and history and data from the preprocedure consultation were reviewed. It was deemed appropriate to proceed.  The patient was taken to [facility] endoscopy room 2, identified as Joseph Moyer and the procedure verified as Flexible Video Fiberoptic Bronchoscopy.  A Time Out was held and the above information confirmed. After being taken to the operating room general anesthesia was initiated and the patient  was orally intubated. The video fiberoptic bronchoscope was introduced via the endotracheal tube and a general inspection was performed which showed normal right and left lung anatomy no evidence of endobronchial lesion. The standard scope was then withdrawn and the endobronchial ultrasound was used to identify and characterize the peritracheal, hilar and bronchial lymph nodes. Inspection showed enlarged subcarinal and pretracheal adenopathy. Using real-time ultrasound guidance Wang needle biopsies were take from  [place] nodes and were sent for cytology.  Following the endobronchial ultrasound component the standard bronchoscope was inserted into the airway.  The therapeutic scope was used for bilateral aspiration of the mainstem's and removal of any remaining blood clots and debris.  At the termination of the procedure the distal subsegments were patent and there was no evidence of active bleeding.  The patient tolerated the procedure well without apparent complications. There was no significant blood loss. The bronchoscope was withdrawn. Anesthesia was reversed and the patient was taken to the PACU for recovery.   Samples: 1. Wang needle biopsies from 4R node 2. Wang needle biopsies from 7 node  Plans:  The patient will be discharged from the PACU to home when recovered from anesthesia. We will review the cytology, pathology and microbiology results with the patient when they become available. Outpatient followup will be with Garner Nash, DO.    Garner Nash, DO [facility] 03/12/2020 10:20 AM

## 2020-03-12 NOTE — Telephone Encounter (Signed)
PCCM:  Discussed PET avid prostate lesion seen on recent imaging. Referral placed to urology.  Also, will need to see if oncology needs to follow up with patient. Likely await final bronchoscopy results.   [facility] 03/12/2020 4:21 PM

## 2020-03-12 NOTE — Discharge Instructions (Signed)
Flexible Bronchoscopy, Care After This sheet gives you information about how to care for yourself after your test. Your doctor may also give you more specific instructions. If you have problems or questions, contact your doctor. Follow these instructions at home: Eating and drinking  Do not eat or drink anything (not even water) for 2 hours after your test, or until your numbing medicine (local anesthetic) wears off.  When your numbness is gone and your cough and gag reflexes have come back, you may: ? Eat only soft foods. ? Slowly drink liquids.  The day after the test, go back to your normal diet. Driving  Do not drive for 24 hours if you were given a medicine to help you relax (sedative).  Do not drive or use heavy machinery while taking prescription pain medicine. General instructions   Take over-the-counter and prescription medicines only as told by your doctor.  Return to your normal activities as told. Ask what activities are safe for you.  Do not use any products that have nicotine or tobacco in them. This includes cigarettes and e-cigarettes. If you need help quitting, ask your doctor.  Keep all follow-up visits as told by your doctor. This is important. It is very important if you had a tissue sample (biopsy) taken. Get help right away if:  You have shortness of breath that gets worse.  You get light-headed.  You feel like you are going to pass out (faint).  You have chest pain.  You cough up: ? More than a little blood. ? More blood than before. Summary  Do not eat or drink anything (not even water) for 2 hours after your test, or until your numbing medicine wears off.  Do not use cigarettes. Do not use e-cigarettes.  Get help right away if you have chest pain.   This information is not intended to replace advice given to you by your health care provider. Make sure you discuss any questions you have with your health care provider. Document Released:  11/13/2008 Document Revised: 12/29/2016 Document Reviewed: 02/04/2016 Elsevier Patient Education  2020 [place] Anesthesia, Adult, Care After This sheet gives you information about how to care for yourself after your procedure. Your health care provider may also give you more specific instructions. If you have problems or questions, contact your health care provider. What can I expect after the procedure? After the procedure, the following side effects are common:  Pain or discomfort at the IV site.  Nausea.  Vomiting.  Sore throat.  Trouble concentrating.  Feeling cold or chills.  Feeling weak or tired.  Sleepiness and fatigue.  Soreness and body aches. These side effects can affect parts of the body that were not involved in surgery. Follow these instructions at home: For the time period you were told by your health care provider:  Rest.  Do not participate in activities where you could fall or become injured.  Do not drive or use machinery.  Do not drink alcohol.  Do not take sleeping pills or medicines that cause drowsiness.  Do not make important decisions or sign legal documents.  Do not take care of children on your own.   Eating and drinking  Follow any instructions from your health care provider about eating or drinking restrictions.  When you feel hungry, start by eating small amounts of foods that are soft and easy to digest (bland), such as toast. Gradually return to your regular diet.  Drink enough fluid to keep  your urine pale yellow.  If you vomit, rehydrate by drinking water, juice, or clear broth. General instructions  If you have sleep apnea, surgery and certain medicines can increase your risk for breathing problems. Follow instructions from your health care provider about wearing your sleep device: ? Anytime you are sleeping, including during daytime naps. ? While taking prescription pain medicines, sleeping medicines, or  medicines that make you drowsy.  Have a responsible adult stay with you for the time you are told. It is important to have someone help care for you until you are awake and alert.  Return to your normal activities as told by your health care provider. Ask your health care provider what activities are safe for you.  Take over-the-counter and prescription medicines only as told by your health care provider.  If you smoke, do not smoke without supervision.  Keep all follow-up visits as told by your health care provider. This is important. Contact a health care provider if:  You have nausea or vomiting that does not get better with medicine.  You cannot eat or drink without vomiting.  You have pain that does not get better with medicine.  You are unable to pass urine.  You develop a skin rash.  You have a fever.  You have redness around your IV site that gets worse. Get help right away if:  You have difficulty breathing.  You have chest pain.  You have blood in your urine or stool, or you vomit blood. Summary  After the procedure, it is common to have a sore throat or nausea. It is also common to feel tired.  Have a responsible adult stay with you for the time you are told. It is important to have someone help care for you until you are awake and alert.  When you feel hungry, start by eating small amounts of foods that are soft and easy to digest (bland), such as toast. Gradually return to your regular diet.  Drink enough fluid to keep your urine pale yellow.  Return to your normal activities as told by your health care provider. Ask your health care provider what activities are safe for you. This information is not intended to replace advice given to you by your health care provider. Make sure you discuss any questions you have with your health care provider. Document Revised: 10/02/2019 Document Reviewed: 05/01/2019 Elsevier Patient Education  2021 [employer].

## 2020-03-12 NOTE — Telephone Encounter (Signed)
I received referral on Mr. Joseph Moyer.  I called and schedule him to be seen on 03/18/20 arrive at 2:30.  He verbalized understanding of appt time and place.

## 2020-03-12 NOTE — Transfer of Care (Signed)
Immediate Anesthesia Transfer of Care Note  Patient: Joseph Moyer  Procedure(s) Performed: VIDEO BRONCHOSCOPY WITH ENDOBRONCHIAL ULTRASOUND (N/A ) BRONCHIAL NEEDLE ASPIRATION BIOPSIES  Patient Location: PACU  Anesthesia Type:General  Level of Consciousness: awake  Airway & Oxygen Therapy: Patient Spontanous Breathing and Patient connected to face mask oxygen  Post-op Assessment: Stable  Post vital signs: Reviewed and stable  Last Vitals:  Vitals Value Taken Time  BP 139/86 03/12/20 1043  Temp 36.1 C 03/12/20 1030  Pulse 80 03/12/20 1048  Resp 15 03/12/20 1048  SpO2 100 % 03/12/20 1048  Vitals shown include unvalidated device data.  Last Pain:  Vitals:   03/12/20 1040  PainSc: 0-No pain      Patients Stated Pain Goal: 5 (33/00/76 2263)  Complications: No complications documented.

## 2020-03-13 NOTE — Anesthesia Postprocedure Evaluation (Signed)
Anesthesia Post Note  Patient: GLADYS GUTMAN  Procedure(s) Performed: VIDEO BRONCHOSCOPY WITH ENDOBRONCHIAL ULTRASOUND (N/A ) BRONCHIAL NEEDLE ASPIRATION BIOPSIES     Patient location during evaluation: PACU Anesthesia Type: General Level of consciousness: awake and alert Pain management: pain level controlled Vital Signs Assessment: post-procedure vital signs reviewed and stable Respiratory status: spontaneous breathing, nonlabored ventilation, respiratory function stable and patient connected to nasal cannula oxygen Cardiovascular status: blood pressure returned to baseline and stable Postop Assessment: no apparent nausea or vomiting Anesthetic complications: no   No complications documented.  Last Vitals:  Vitals:   03/12/20 1050 03/12/20 1055  BP: 140/87 136/86  Pulse: 72 73  Resp: 15 10  Temp:  (!) 36.4 C  SpO2: 99% 99%    Last Pain:  Vitals:   03/12/20 1055  PainSc: 0-No pain                 Kamden Reber S

## 2020-03-15 ENCOUNTER — Encounter (HOSPITAL_COMMUNITY): Payer: Self-pay | Admitting: Pulmonary Disease

## 2020-03-16 ENCOUNTER — Telehealth: Payer: Self-pay | Admitting: Pulmonary Disease

## 2020-03-16 LAB — CYTOLOGY - NON PAP

## 2020-03-16 LAB — SURGICAL PATHOLOGY

## 2020-03-16 NOTE — Telephone Encounter (Signed)
BI please advise of results of bronch done on 2/11.

## 2020-03-18 ENCOUNTER — Other Ambulatory Visit: Payer: Self-pay

## 2020-03-18 ENCOUNTER — Encounter: Payer: Self-pay | Admitting: Internal Medicine

## 2020-03-18 ENCOUNTER — Inpatient Hospital Stay: Payer: Medicare HMO | Attending: Internal Medicine | Admitting: Internal Medicine

## 2020-03-18 ENCOUNTER — Inpatient Hospital Stay: Payer: Medicare HMO

## 2020-03-18 VITALS — BP 133/86 | HR 95 | Temp 97.8°F | Resp 16 | Ht 71.75 in | Wt 223.8 lb

## 2020-03-18 DIAGNOSIS — R918 Other nonspecific abnormal finding of lung field: Secondary | ICD-10-CM

## 2020-03-18 DIAGNOSIS — R591 Generalized enlarged lymph nodes: Secondary | ICD-10-CM | POA: Diagnosis present

## 2020-03-18 DIAGNOSIS — Z87891 Personal history of nicotine dependence: Secondary | ICD-10-CM

## 2020-03-18 DIAGNOSIS — I10 Essential (primary) hypertension: Secondary | ICD-10-CM

## 2020-03-18 DIAGNOSIS — N429 Disorder of prostate, unspecified: Secondary | ICD-10-CM | POA: Insufficient documentation

## 2020-03-18 DIAGNOSIS — R590 Localized enlarged lymph nodes: Secondary | ICD-10-CM | POA: Diagnosis not present

## 2020-03-18 DIAGNOSIS — E785 Hyperlipidemia, unspecified: Secondary | ICD-10-CM | POA: Diagnosis not present

## 2020-03-18 LAB — CMP (CANCER CENTER ONLY)
ALT: 18 U/L (ref 0–44)
AST: 16 U/L (ref 15–41)
Albumin: 4 g/dL (ref 3.5–5.0)
Alkaline Phosphatase: 51 U/L (ref 38–126)
Anion gap: 7 (ref 5–15)
BUN: 16 mg/dL (ref 8–23)
CO2: 28 mmol/L (ref 22–32)
Calcium: 9.1 mg/dL (ref 8.9–10.3)
Chloride: 103 mmol/L (ref 98–111)
Creatinine: 1.08 mg/dL (ref 0.61–1.24)
GFR, Estimated: >60 mL/min (ref >60)
Glucose, Bld: 121 mg/dL — ABNORMAL HIGH (ref 70–99)
Potassium: 4.2 mmol/L (ref 3.5–5.1)
Sodium: 138 mmol/L (ref 135–145)
Total Bilirubin: 0.3 mg/dL (ref 0.3–1.2)
Total Protein: 7.5 g/dL (ref 6.5–8.1)

## 2020-03-18 LAB — CBC WITH DIFFERENTIAL (CANCER CENTER ONLY)
Abs Immature Granulocytes: 0.02 10*3/uL (ref 0.00–0.07)
Basophils Absolute: 0 10*3/uL (ref 0.0–0.1)
Basophils Relative: 0 %
Eosinophils Absolute: 0.1 10*3/uL (ref 0.0–0.5)
Eosinophils Relative: 1 %
HCT: 34 % — ABNORMAL LOW (ref 39.0–52.0)
Hemoglobin: 11.5 g/dL — ABNORMAL LOW (ref 13.0–17.0)
Immature Granulocytes: 0 %
Lymphocytes Relative: 63 %
Lymphs Abs: 4.9 10*3/uL — ABNORMAL HIGH (ref 0.7–4.0)
MCH: 29.6 pg (ref 26.0–34.0)
MCHC: 33.8 g/dL (ref 30.0–36.0)
MCV: 87.4 fL (ref 80.0–100.0)
Monocytes Absolute: 0.4 10*3/uL (ref 0.1–1.0)
Monocytes Relative: 5 %
Neutro Abs: 2.5 10*3/uL (ref 1.7–7.7)
Neutrophils Relative %: 31 %
Platelet Count: 139 10*3/uL — ABNORMAL LOW (ref 150–400)
RBC: 3.89 MIL/uL — ABNORMAL LOW (ref 4.22–5.81)
RDW: 16.4 % — ABNORMAL HIGH (ref 11.5–15.5)
WBC Count: 7.9 10*3/uL (ref 4.0–10.5)
nRBC: 0 % (ref 0.0–0.2)

## 2020-03-18 NOTE — Telephone Encounter (Signed)
I called the patient this morning via phone.   Garner Nash, DO  [facility] 03/18/2020 1:47 AM

## 2020-03-18 NOTE — Patient Instructions (Signed)
Lymphadenopathy  Lymphadenopathy means that your lymph glands are swollen or larger than normal. Lymph glands, also called lymph nodes, are collections of tissue that filter excess fluid, bacteria, viruses, and waste from your bloodstream. They are part of your body's disease-fighting system (immune system), which protects your body from germs. There may be different causes of lymphadenopathy, depending on where it is in your body. Some types go away on their own. Lymphadenopathy can occur anywhere that you have lymph glands, including these areas:  Neck (cervical lymphadenopathy).  Chest (mediastinal lymphadenopathy).  Lungs (hilar lymphadenopathy).  Underarms (axillary lymphadenopathy).  Groin (inguinal lymphadenopathy). When your immune system responds to germs, infection-fighting cells and fluid build up in your lymph glands. This causes some swelling and enlargement. If the lymph nodes do not go back to normal size after you have an infection or disease, your health care provider may do tests. These tests help to monitor your condition and find the reason why the glands are still swollen and enlarged. Follow these instructions at home:  Get plenty of rest.  Your health care provider may recommend over-the-counter medicines for pain. Take over-the-counter and prescription medicines only as told by your health care provider.  If directed, apply heat to swollen lymph glands as often as told by your health care provider. Use the heat source that your health care provider recommends, such as a moist heat pack or a heating pad. ? Place a towel between your skin and the heat source. ? Leave the heat on for 20-30 minutes. ? Remove the heat if your skin turns bright red. This is especially important if you are unable to feel pain, heat, or cold. You may have a greater risk of getting burned.  Check your affected lymph glands every day for changes. Check other lymph gland areas as told by your  health care provider. Check for changes such as: ? More swelling. ? Sudden increase in size. ? Redness or pain. ? Hardness.  Keep all follow-up visits. This is important.   Contact a health care provider if you have:  Lymph glands that: ? Are still swollen after 2 weeks. ? Have suddenly gotten bigger or the swelling spreads. ? Are red, painful, or hard.  Fluid leaking from the skin near an enlarged lymph gland.  Problems with breathing.  A fever, chills, or night sweats.  Fatigue.  A sore throat.  Pain in your abdomen.  Weight loss. Get help right away if you have:  Severe pain.  Chest pain.  Shortness of breath. These symptoms may represent a serious problem that is an emergency. Do not wait to see if the symptoms will go away. Get medical help right away. Call your local emergency services (911 in the [place] Do not drive yourself to the hospital. Summary  Lymphadenopathy means that your lymph glands are swollen or larger than normal.  Lymph glands, also called lymph nodes, are collections of tissue that filter excess fluid, bacteria, viruses, and waste from the bloodstream. They are part of your body's disease-fighting system (immune system).  Lymphadenopathy can occur anywhere that you have lymph glands.  If the lymph nodes do not go back to normal size after you have an infection or disease, your health care provider may do tests to monitor your condition and find the reason why the glands are still swollen and enlarged.  Check your affected lymph glands every day for changes. Check other lymph gland areas as told by your health care provider. This information   is not intended to replace advice given to you by your health care provider. Make sure you discuss any questions you have with your health care provider. Document Revised: 11/12/2019 Document Reviewed: 11/12/2019 Elsevier Patient Education  2021 [employer]  

## 2020-03-18 NOTE — Progress Notes (Signed)
[place] [phone]) [phone]   [phone]  CONSULT NOTE  REFERRING PHYSICIAN: Dr. Leory Plowman Icard  REASON FOR CONSULTATION:  80 years old African-American male with lymphadenopathy  HPI Joseph Moyer is a 80 y.o. male with past medical history significant for osteoarthritis, hypertension, dyslipidemia, history of allergy as well as history for smoking but quit 30 years ago.  The patient mentioned that he was mowing his yard when he started having shortness of breath.  He was advised by friend to see cardiology for evaluation.  He had cardiac work-up that was unremarkable.  He had CT of the coronaries that showed suspicious lymphadenopathy in the chest.  This was followed by CT scan of the chest on February 19, 2020 and it showed extensive thoracic adenopathy highly suspicious for lymphoproliferative disorder.  The lymphadenopathy include bilateral axillary adenopathy with an index left-sided node measuring maximally 3.5 cm.  There was also mediastinal lymphadenopathy with the precarinal node measuring 1.8 cm and a node within the subcarinal station with extension into the azygos esophageal recess measuring 1.5 cm there was also right hilar adenopathy measuring 1.5 cm and prevascular node measuring 1.3 cm.  The patient was referred to Dr. Valeta Harms and had a PET scan which was performed on March 08, 2020.  And that showed Deauville 3 and low Deauville 4 adenopathy in the neck, and abdomen pelvis.  This favoring lymphoproliferative disorder.  There was no splenomegaly or focal splenic lesion identified.  There was also accentuated activity in the posterior lateral peripheral zones of the prostate gland at the apex right greater than left with maximum SUV of 8.1. The patient underwent video bronchoscopy with endobronchial ultrasound procedure under the care of Dr. Valeta Harms on March 12, 2020.  The final pathology showed benign lymphoid tissue and the fine-needle aspiration  of lymph node from level 7 and 4R The patient was referred to me today for evaluation and recommendation regarding the suspicious myeloproliferative process. When seen today he is feeling fine today with no concerning complaints.  He denied having any recent weight loss but has occasional night sweats.  He has no chest pain but has shortness of breath with exertion and no cough or hemoptysis.  He has no nausea, vomiting, diarrhea or constipation.  He denied having any fever or chills.  He has no headache or visual changes but has occasional muscle pain in the left arm. Family history significant for mother, brother and sister with heart disease.  His father died from alcoholic complication. The patient is married and has 1 daughter.  He is currently retired and works part-time at the [employer] in [place].  Has a history of smoking for around 25 years and quit December 27, 1992.  He drinks a couple of beer few days a week and no history of drug abuse. HPI  Past Medical History:  Diagnosis Date  . Allergy   . Arthritis   . GERD (gastroesophageal reflux disease)   . Hyperlipidemia   . Hypertension     Past Surgical History:  Procedure Laterality Date  . BRONCHIAL NEEDLE ASPIRATION BIOPSY  03/12/2020   Procedure: BRONCHIAL NEEDLE ASPIRATION BIOPSIES;  Surgeon: Garner Nash, DO;  Location: [facility];  Service: Pulmonary;;  . CIRCUMCISION    . COLONOSCOPY WITH PROPOFOL N/A 05/10/2015   Procedure: COLONOSCOPY WITH PROPOFOL;  Surgeon: Garlan Fair, MD;  Location: [facility];  Service: Endoscopy;  Laterality: N/A;  . DECOMPRESSIVE LUMBAR LAMINECTOMY LEVEL 1 N/A  06/15/2018   Procedure: Gordy Levan decompression L1-L2/ complete inferior facetectomyof L1. Posterior lateral arthrodesiswith autograft bone L1-L2.;  Surgeon: Melina Schools, MD;  Location: [facility];  Service: Orthopedics;  Laterality: N/A;  . HEMORRHOID SURGERY    . VIDEO BRONCHOSCOPY WITH ENDOBRONCHIAL ULTRASOUND N/A  03/12/2020   Procedure: VIDEO BRONCHOSCOPY WITH ENDOBRONCHIAL ULTRASOUND;  Surgeon: Garner Nash, DO;  Location: [facility];  Service: Pulmonary;  Laterality: N/A;    Family History  Problem Relation Age of Onset  . Diabetes Mother   . Diabetes Sister   . Diabetes Brother     Social History Social History   Tobacco Use  . Smoking status: Former Smoker    Quit date: 1994    Years since quitting: 28.1  . Smokeless tobacco: Never Used  Vaping Use  . Vaping Use: Never used  Substance Use Topics  . Alcohol use: Yes    Alcohol/week: 0.0 standard drinks    Comment: occassionally  . Drug use: No    Allergies  Allergen Reactions  . Oxycodone Itching  . Ramipril     Swelling under eyes  . Rosuvastatin Itching    Pt reports this medication causes him to itch.     Current Outpatient Medications  Medication Sig Dispense Refill  . amLODipine (NORVASC) 10 MG tablet Take 1 tablet (10 mg total) by mouth daily. 90 tablet 3  . aspirin 81 MG tablet Take 81 mg by mouth every morning.     Marland Kitchen atorvastatin (LIPITOR) 40 MG tablet Take 1 tablet (40 mg total) by mouth daily. 90 tablet 3  . diphenhydrAMINE HCl, Sleep, 50 MG CAPS Take 50 mg by mouth at bedtime.    Marland Kitchen losartan (COZAAR) 25 MG tablet Take 1 tablet (25 mg total) by mouth daily. 90 tablet 3  . Magnesium ([employer] TRIPLE MAGNESIUM COMPLEX) 400 MG CAPS Take 400 mg by mouth daily.    . meloxicam (MOBIC) 15 MG tablet Take 15 mg by mouth daily as needed for pain.    . Naphazoline-Glycerin (CLEAR EYES REDNESS RELIEF OP) Place 1 drop into both eyes daily.    Marland Kitchen OVER THE COUNTER MEDICATION Apply 1 application topically daily as needed (pain). Hemp cream     No current facility-administered medications for this visit.    Review of Systems  Constitutional: positive for fatigue Eyes: negative Ears, nose, mouth, throat, and face: negative Respiratory: positive for dyspnea on exertion Cardiovascular: negative Gastrointestinal:  negative Genitourinary:negative Integument/breast: negative Hematologic/lymphatic: positive for lymphadenopathy Musculoskeletal:negative Neurological: negative Behavioral/Psych: negative Endocrine: negative Allergic/Immunologic: negative  Physical Exam  EZM:OQHUT, healthy, no distress, well nourished and well developed SKIN: skin color, texture, turgor are normal, no rashes or significant lesions HEAD: Normocephalic, No masses, lesions, tenderness or abnormalities EYES: normal, PERRLA, Conjunctiva are pink and non-injected EARS: External ears normal, Canals clear OROPHARYNX:no exudate, no erythema and lips, buccal mucosa, and tongue normal  NECK: supple, no adenopathy, no JVD LYMPH:  no palpable lymphadenopathy, no hepatosplenomegaly LUNGS: clear to auscultation , and palpation HEART: regular rate & rhythm, no murmurs and no gallops ABDOMEN:abdomen soft, non-tender, normal bowel sounds and no masses or organomegaly BACK: No CVA tenderness, Range of motion is normal EXTREMITIES:no joint deformities, effusion, or inflammation, no edema  NEURO: alert & oriented x 3 with fluent speech, no focal motor/sensory deficits  PERFORMANCE STATUS: ECOG 1  LABORATORY DATA: Lab Results  Component Value Date   WBC 8.5 02/24/2020   HGB 12.9 (L) 02/24/2020   HCT 39.6 02/24/2020   MCV 87  02/24/2020   PLT 181 02/24/2020      Chemistry      Component Value Date/Time   NA 136 03/12/2020 0652   NA 139 02/05/2020 1503   K 4.3 03/12/2020 0652   CL 103 03/12/2020 0652   CO2 23 03/12/2020 0652   BUN 22 03/12/2020 0652   BUN 11 02/05/2020 1503   CREATININE 0.94 03/12/2020 0652      Component Value Date/Time   CALCIUM 9.0 03/12/2020 0652   ALKPHOS 50 06/18/2018 0514   AST 70 (H) 06/18/2018 0514   ALT 90 (H) 06/18/2018 0514   BILITOT 0.8 06/18/2018 0514       RADIOGRAPHIC STUDIES: CT Chest W Contrast  Result Date: 02/20/2020 CLINICAL DATA:  Follow-up of thoracic adenopathy on prior  cardiac CT. EXAM: CT CHEST WITH CONTRAST TECHNIQUE: Multidetector CT imaging of the chest was performed during intravenous contrast administration. CONTRAST:  67mL OMNIPAQUE IOHEXOL 300 MG/ML  SOLN COMPARISON:  02/10/2020 coronary CTA. Chest radiograph 06/14/2018 also reviewed. FINDINGS: Cardiovascular: Aortic atherosclerosis. Normal heart size, without pericardial effusion. Multivessel coronary artery atherosclerosis. No central pulmonary embolism, on this non-dedicated study. Mediastinum/Nodes: Soft tissue fullness about the anterior low left neck, including on 01/02. Bilateral axillary adenopathy, as evidenced by increased number and size of nodes, with maintenance of fatty hila. An index left-sided node measures maximally 3.5 cm on 43/2. Mediastinal adenopathy, with a precarinal node measuring 1.8 cm on 59/2. A node within the subcarinal station with extension into the azygoesophageal recess measures 1.5 cm on 72/2. Right hilar adenopathy at 1.5 cm on 68/2. Prevascular node of 1.3 cm on 53/2. Lungs/Pleura: No pleural fluid.  Mild centrilobular emphysema. Upper Abdomen: Normal imaged portions of the liver, spleen, stomach, pancreas, adrenal glands. Porta hepatis node of 1.2 cm on 147/2 is upper normal sized. Musculoskeletal: No acute osseous abnormality. IMPRESSION: 1. Extensive thoracic adenopathy, highly suspicious for lymphoproliferative process. Consider multidisciplinary thoracic oncology consultation for eventual PET and tissue sampling. 2. Possible soft tissue fullness about the low left neck. This may represent adenopathy. Recommend attention on follow-up. 3. Upper normal sized upper abdominal nodes, indeterminate. 4. 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 Electronically Signed   By: Abigail Miyamoto M.D.   On: 02/20/2020 08:49   NM PET Image Initial (PI) Skull Base To Thigh  Result Date: 03/08/2020 CLINICAL DATA:  Initial treatment strategy for  thoracic adenopathy and suspicion for lymphoproliferative disease. EXAM: NUCLEAR MEDICINE PET SKULL BASE TO THIGH TECHNIQUE: 10.7 mCi F-18 FDG was injected intravenously. Full-ring PET imaging was performed from the skull base to thigh after the radiotracer. CT data was obtained and used for attenuation correction and anatomic localization. Fasting blood glucose: 106 mg/dl COMPARISON:  Chest CT 02/19/2020 FINDINGS: Mediastinal blood pool activity: SUV max 2.4 Liver activity: SUV max 3.7 NECK: Mildly hypermetabolic level IIa, level II, level III, level V, and level Ib adenopathy observed. Index right level IIa lymph node measures 1.3 cm in short axis on image 29 of series 4 with maximum SUV 4.2, Deauville 4. Index left level V lymph node measures 1.2 cm in short axis on image 43 of series 4 with maximum SUV of 2.5, Deauville 3. Mildly accentuated activity along the soft palate, maximum SUV 6.7, Deauville 4. Incidental CT findings: Anterior to the left lobe of the thyroid, a cystic lesion does not have substantial accentuated metabolic activity and was shown on the prior thyroid ultrasound of 10/02/2000. This is not felt to be within the thyroid gland  itself. CHEST: Multiple small and mildly enlarged mediastinal and axillary lymph nodes are present. One of the larger right paratracheal nodes measures 1.5 cm in short axis on image 68 of series 4 with maximum SUV 2.6, Deauville 3. Subcarinal node 1.3 cm in short axis on image 79 of series 4 with maximum SUV 3.5, Deauville 3. A right axillary node with fatty hilum and short axis diameter of about 1.2 cm on image 61 of series 4 (excluding the flat fatty hilum) has a maximum SUV of 3.2, Deauville 3. Incidental CT findings: Atherosclerotic calcification of the thoracic aorta, right coronary artery, and left anterior descending coronary artery. ABDOMEN/PELVIS: No splenomegaly or focal hypermetabolic splenic activity. Mildly hypermetabolic enlarged retroperitoneal and pelvic  lymph nodes are present. Index left periaortic lymph node 1.3 cm in short axis on image 144 of series 4 with maximum SUV 3.2, Deauville 3. Index left external iliac lymph node 1.2 cm in short axis on image 187 of series 4 with maximum SUV of 3.9, Deauville 4. Numerous small Deauville 2 and overall 3 mesenteric lymph nodes are present. A left inguinal node with a short axis diameter 1.1 cm on image 197 of series 4 has a maximum SUV of 5.2, Deauville 4. There is some accentuated activity in the posterolateral peripheral zone of the prostate gland at the apex, more notable on the right with maximum SUV of 8.1 Incidental CT findings: Cholelithiasis. Aortoiliac atherosclerotic vascular disease. Colonic diverticulosis. Nondistended urinary bladder. Distal right iloipsoas lipoma. SKELETON: No significant abnormal hypermetabolic activity in this region. Incidental CT findings: Posterolateral rod and pedicle screw fixation at L1-2. Lower lumbar spondylosis and degenerative disc disease likely causing multilevel impingement. IMPRESSION: 1. Deauville 3 and low Deauville 4 adenopathy in the neck and abdomen/pelvis, with Deauville 3 adenopathy in the chest. Wide distribution favors lymphoproliferative disease. Tissue diagnosis recommended. A left inguinal lymph node, although not as enlarged, is hypermetabolic and might present a reasonably superficial site for biopsy. 2. No splenomegaly or focal splenic lesion identified. 3. Accentuated activity in the posterolateral peripheral zones of the prostate gland at the apex, right greater than left, with maximum SUV of 8.1. Although nonspecific, prostate cancer is not excluded correlation with PSA level is suggested. 4. Accentuated activity along the soft palate is nonspecific but probably benign. 5. 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 6.  Aortic Atherosclerosis (ICD10-I70.0).  Coronary atherosclerosis. 7.  Cholelithiasis. 8. Colonic diverticulosis. 9. Multilevel lumbar impingement. Electronically Signed   By: Van Clines M.D.   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   PLAN: I had a lengthy discussion with the patient today about his current condition and further investigation to confirm diagnosis.  The patient underwent bronchoscopy with endobronchial ultrasound and biopsy of some of the hilar and mediastinal lymphadenopathy that were unremarkable for malignancy and showed only lymphoid tissue. I recommended for the patient to proceed with ultrasound-guided core biopsy of one of the hypermetabolic left inguinal lymph nodes for confirmation of tissue diagnosis. For the suspicious lesion seen on the PET scan in the prostate gland, I will check his PSA with the upcoming lab. I will arrange for the patient to come back for follow-up visit in 2 weeks for evaluation and repeat blood work. The patient  was advised to call immediately if he has any other concerning symptoms in the interval.  The patient voices understanding of current disease status and treatment options and is in agreement with the current care plan.  All questions were answered. The patient knows to call the clinic with any problems, questions or concerns. We can certainly see the patient much sooner if necessary.  Thank you so much for allowing me to participate in the care of Joseph Moyer. I will continue to follow up the patient with you and assist in his care. The total time spent in the appointment was 60 minutes.  Disclaimer: This note was dictated with voice recognition software. Similar sounding words can inadvertently be transcribed and may not be corrected upon  review.   Eilleen Kempf March 18, 2020, 2:53 PM

## 2020-03-22 ENCOUNTER — Other Ambulatory Visit: Payer: Self-pay

## 2020-03-22 ENCOUNTER — Telehealth (HOSPITAL_COMMUNITY): Payer: Self-pay

## 2020-03-22 ENCOUNTER — Ambulatory Visit: Payer: Medicare HMO | Admitting: Pulmonary Disease

## 2020-03-22 ENCOUNTER — Encounter: Payer: Self-pay | Admitting: Pulmonary Disease

## 2020-03-22 VITALS — BP 128/72 | HR 86 | Temp 97.8°F | Ht 71.0 in | Wt 220.4 lb

## 2020-03-22 DIAGNOSIS — R948 Abnormal results of function studies of other organs and systems: Secondary | ICD-10-CM | POA: Diagnosis not present

## 2020-03-22 DIAGNOSIS — R590 Localized enlarged lymph nodes: Secondary | ICD-10-CM | POA: Diagnosis not present

## 2020-03-22 DIAGNOSIS — Z87891 Personal history of nicotine dependence: Secondary | ICD-10-CM | POA: Diagnosis not present

## 2020-03-22 DIAGNOSIS — R0602 Shortness of breath: Secondary | ICD-10-CM | POA: Diagnosis not present

## 2020-03-22 DIAGNOSIS — R599 Enlarged lymph nodes, unspecified: Secondary | ICD-10-CM

## 2020-03-22 DIAGNOSIS — R918 Other nonspecific abnormal finding of lung field: Secondary | ICD-10-CM

## 2020-03-22 NOTE — Patient Instructions (Addendum)
Thank you for visiting Dr. Valeta Harms at [facility]. Today we recommend the following:  Orders Placed This Encounter  Procedures  . Pulmonary Function Test   Return in about 4 weeks (around 04/19/2020) for with APP.    Please do your part to reduce the spread of COVID-19.

## 2020-03-22 NOTE — Progress Notes (Signed)
Synopsis: Referred in January 2022 for abnormal CT chest by [name], MD  Subjective:   PATIENT ID: Joseph Moyer GENDER: male DOB: 1941-01-01, MRN: [id number]  Chief Complaint  Patient presents with  . Follow-up    No complaints currently    [name] past medical history of allergies, arthritis, hyperlipidemia, hypertension family history of diabetes.  Patient has no significant respiratory complaints at this time.  Abnormal imaging was completed.  Patient had coronary CT flow imaging which led to a CT scan of the chest.  This revealed bilateral adenopathy within the mediastinum.  The precarinal node at 1.8 cm.  There was right hilar adenopathy present mild centrilobular emphysema.  Additionally there was some adenopathy within the axilla.  Denies fevers chills night sweats weight loss.  OV 03/22/2020: Here today for follow-up after recent bronchoscopy. Tissue sampling of the mediastinal nodes revealed lymphocytes. There was insufficient material for flow cytometry. Patient has had follow-up and evaluation by medical oncology. They recommended a CT-guided biopsy core needle biopsy of one of the inguinal nodes to see if we can get an answer. Otherwise from a respiratory standpoint he does feel short of breath with exertion he quit smoking in 1994. He only feels short of breath when he exerts himself doing labor or going up and down stairs.    Past Medical History:  Diagnosis Date  . Allergy   . Arthritis   . GERD (gastroesophageal reflux disease)   . Hyperlipidemia   . Hypertension      Family History  Problem Relation Age of Onset  . Diabetes Mother   . Diabetes Sister   . Diabetes Brother      Past Surgical History:  Procedure Laterality Date  . BRONCHIAL NEEDLE ASPIRATION BIOPSY  03/12/2020   Procedure: BRONCHIAL NEEDLE ASPIRATION BIOPSIES;  Surgeon: Garner Nash, DO;  Location: [facility];  Service: Pulmonary;;  . CIRCUMCISION    .  COLONOSCOPY WITH PROPOFOL N/A 05/10/2015   Procedure: COLONOSCOPY WITH PROPOFOL;  Surgeon: Garlan Fair, MD;  Location: [facility];  Service: Endoscopy;  Laterality: N/A;  . DECOMPRESSIVE LUMBAR LAMINECTOMY LEVEL 1 N/A 06/15/2018   Procedure: Gordy Levan decompression L1-L2/ complete inferior facetectomyof L1. Posterior lateral arthrodesiswith autograft bone L1-L2.;  Surgeon: Melina Schools, MD;  Location: [facility];  Service: Orthopedics;  Laterality: N/A;  . HEMORRHOID SURGERY    . VIDEO BRONCHOSCOPY WITH ENDOBRONCHIAL ULTRASOUND N/A 03/12/2020   Procedure: VIDEO BRONCHOSCOPY WITH ENDOBRONCHIAL ULTRASOUND;  Surgeon: Garner Nash, DO;  Location: [facility];  Service: Pulmonary;  Laterality: N/A;    Social History   Socioeconomic History  . Marital status: Married    Spouse name: Not on file  . Number of children: Not on file  . Years of education: Not on file  . Highest education level: Not on file  Occupational History  . Not on file  Tobacco Use  . Smoking status: Former Smoker    Quit date: 1994    Years since quitting: 28.1  . Smokeless tobacco: Never Used  Vaping Use  . Vaping Use: Never used  Substance and Sexual Activity  . Alcohol use: Yes    Alcohol/week: 0.0 standard drinks    Comment: occassionally  . Drug use: No  . Sexual activity: Not on file  Other Topics Concern  . Not on file  Social History Narrative  . Not on file   Social Determinants of Health   Financial Resource Strain: Not on file  Food Insecurity: Not on file  Transportation Needs: Not on file  Physical Activity: Not on file  Stress: Not on file  Social Connections: Not on file  Intimate Partner Violence: Not on file     Allergies  Allergen Reactions  . Oxycodone Itching  . Ramipril     Swelling under eyes  . Rosuvastatin Itching    Pt reports this medication causes him to itch.      Outpatient Medications Prior to Visit  Medication Sig Dispense Refill  . amLODipine (NORVASC) 10 MG  tablet Take 1 tablet (10 mg total) by mouth daily. 90 tablet 3  . aspirin 81 MG tablet Take 81 mg by mouth every morning.     Marland Kitchen atorvastatin (LIPITOR) 40 MG tablet Take 1 tablet (40 mg total) by mouth daily. 90 tablet 3  . diphenhydrAMINE HCl, Sleep, 50 MG CAPS Take 50 mg by mouth at bedtime.    Marland Kitchen losartan (COZAAR) 25 MG tablet Take 1 tablet (25 mg total) by mouth daily. 90 tablet 3  . Magnesium ([employer] TRIPLE MAGNESIUM COMPLEX) 400 MG CAPS Take 400 mg by mouth daily.    . Naphazoline-Glycerin (CLEAR EYES REDNESS RELIEF OP) Place 1 drop into both eyes daily.    Marland Kitchen OVER THE COUNTER MEDICATION Apply 1 application topically daily as needed (pain). Hemp cream     No facility-administered medications prior to visit.    Review of Systems  Constitutional: Negative for chills, fever, malaise/fatigue and weight loss.  HENT: Negative for hearing loss, sore throat and tinnitus.   Eyes: Negative for blurred vision and double vision.  Respiratory: Positive for shortness of breath. Negative for cough, hemoptysis, sputum production, wheezing and stridor.   Cardiovascular: Negative for chest pain, palpitations, orthopnea, leg swelling and PND.  Gastrointestinal: Negative for abdominal pain, constipation, diarrhea, heartburn, nausea and vomiting.  Genitourinary: Negative for dysuria, hematuria and urgency.  Musculoskeletal: Negative for joint pain and myalgias.  Skin: Negative for itching and rash.  Neurological: Negative for dizziness, tingling, weakness and headaches.  Endo/Heme/Allergies: Negative for environmental allergies. Does not bruise/bleed easily.  Psychiatric/Behavioral: Negative for depression. The patient is not nervous/anxious and does not have insomnia.   All other systems reviewed and are negative.    Objective:  Physical Exam Vitals reviewed.  Constitutional:      General: He is not in acute distress.    Appearance: He is well-developed and well-nourished. He is obese.  HENT:      Head: Normocephalic and atraumatic.     Mouth/Throat:     Mouth: Oropharynx is clear and moist.  Eyes:     General: No scleral icterus.    Conjunctiva/sclera: Conjunctivae normal.     Pupils: Pupils are equal, round, and reactive to light.  Neck:     Vascular: No JVD.     Trachea: No tracheal deviation.  Cardiovascular:     Rate and Rhythm: Normal rate and regular rhythm.     Pulses: Intact distal pulses.     Heart sounds: Normal heart sounds. No murmur heard.   Pulmonary:     Effort: Pulmonary effort is normal. No tachypnea, accessory muscle usage or respiratory distress.     Breath sounds: Normal breath sounds. No stridor. No wheezing, rhonchi or rales.  Abdominal:     General: Bowel sounds are normal. There is no distension.     Palpations: Abdomen is soft.     Tenderness: There is no abdominal tenderness.  Musculoskeletal:  General: No tenderness or edema.     Cervical back: Neck supple.  Lymphadenopathy:     Cervical: No cervical adenopathy.  Skin:    General: Skin is warm and dry.     Capillary Refill: Capillary refill takes less than 2 seconds.     Findings: No rash.  Neurological:     Mental Status: He is alert and oriented to person, place, and time.  Psychiatric:        Mood and Affect: Mood and affect normal.        Behavior: Behavior normal.      Vitals:   03/22/20 1553  BP: 128/72  Pulse: 86  Temp: 97.8 F (36.6 C)  TempSrc: Temporal  SpO2: 98%  Weight: 220 lb 6.4 oz (100 kg)  Height: 5\' 11"  (1.803 m)   98% on RA BMI Readings from Last 3 Encounters:  03/22/20 30.74 kg/m  03/18/20 30.56 kg/m  03/01/20 30.11 kg/m   Wt Readings from Last 3 Encounters:  03/22/20 220 lb 6.4 oz (100 kg)  03/18/20 223 lb 12.8 oz (101.5 kg)  03/01/20 220 lb 8 oz (100 kg)     CBC    Component Value Date/Time   WBC 7.9 03/18/2020 1430   WBC 5.0 06/26/2018 0532   RBC 3.89 (L) 03/18/2020 1430   HGB 11.5 (L) 03/18/2020 1430   HGB 12.9 (L) 02/24/2020  1542   HCT 34.0 (L) 03/18/2020 1430   HCT 39.6 02/24/2020 1542   PLT 139 (L) 03/18/2020 1430   PLT 181 02/24/2020 1542   MCV 87.4 03/18/2020 1430   MCV 87 02/24/2020 1542   MCH 29.6 03/18/2020 1430   MCHC 33.8 03/18/2020 1430   RDW 16.4 (H) 03/18/2020 1430   RDW 15.6 (H) 02/24/2020 1542   LYMPHSABS 4.9 (H) 03/18/2020 1430   LYMPHSABS 5.3 (H) 02/24/2020 1542   MONOABS 0.4 03/18/2020 1430   EOSABS 0.1 03/18/2020 1430   EOSABS 0.1 02/24/2020 1542   BASOSABS 0.0 03/18/2020 1430   BASOSABS 0.0 02/24/2020 1542    Chest Imaging: 02/19/2020: CT chest Extensive thoracic adenopathy that was initially found on coronary CT concerning for potential lymphoproliferative disorder underlying malignancy.  Images reviewed today in the office. The patient's images have been independently reviewed by me.    03/08/2020: PET  PET avid nodes within the chest, neck, abdomen/pelvis The patient's images have been independently reviewed by me.    Pulmonary Functions Testing Results: No flowsheet data found.  FeNO: none  Pathology:   03/12/2020: Bronchoscopy A. LYMPH NODE, 7 NODE, FINE NEEDLE ASPIRATION:  - Lymphoid tissue present   B. LYMPH NODE, 4R, FINE NEEDLE ASPIRATION:  - No malignant cells identified  - No significant lymphoid tissue.   COMMENT:   A. While overt atypia is not seen, a low grade lymphoproliferative  process can not be entirely excluded. Flow cytometry ([id number]) has  insufficient cells for analysis.    Echocardiogram:   1. Left ventricular ejection fraction, by estimation, is 65 to 70%. The  left ventricle has normal function. The left ventricle has no regional  wall motion abnormalities. Left ventricular diastolic parameters were  normal.  2. Right ventricular systolic function is normal. The right ventricular  size is normal. Tricuspid regurgitation signal is inadequate for assessing  PA pressure.  3. The mitral valve is grossly normal. No evidence of mitral  valve  regurgitation. No evidence of mitral stenosis.  4. The aortic valve is tricuspid. There is mild calcification of the  aortic  valve. Aortic valve regurgitation is not visualized. No aortic  stenosis is present.  5. The inferior vena cava is normal in size with greater than 50%  respiratory variability, suggesting right atrial pressure of 3 mmHg.   Heart Catheterization: none     Assessment & Plan:     ICD-10-CM   1. SOB (shortness of breath)  R06.02 Pulmonary Function Test  2. Abnormal positron emission tomography (PET) scan  R94.8   3. Adenopathy  R59.9   4. Abnormal CT scan of lung  R91.8   5. Localized enlarged lymph nodes  R59.0   6. Former smoker  Z87.891   39. Mediastinal adenopathy  R59.0     Discussion:  80 year old gentleman, presents with adenopathy, abnormal pet imaging concern for underlying lymphoproliferative disorder. Reviewed pet imaging today in the office. Patient had a endobronchial ultrasound with transbronchial needle aspiration of the mediastinal nodes which revealed lymphocytes no overt atypical cells. He is a former smoker and does have shortness of breath with exertion  Plan: Has establish care with medical oncology They have ordered repeat biopsy for core biopsy of one of the inguinal lymph nodes. We will let oncology continue the work-up of the adenopathy. As for his shortness of breath we will plan for pulmonary function tests to see if he has COPD. He is a former smoker that quit 1994. Return to clinic in approximately 3 to 4 weeks at the time of PFTs being scheduled to review.   Current Outpatient Medications:  .  amLODipine (NORVASC) 10 MG tablet, Take 1 tablet (10 mg total) by mouth daily., Disp: 90 tablet, Rfl: 3 .  aspirin 81 MG tablet, Take 81 mg by mouth every morning. , Disp: , Rfl:  .  atorvastatin (LIPITOR) 40 MG tablet, Take 1 tablet (40 mg total) by mouth daily., Disp: 90 tablet, Rfl: 3 .  diphenhydrAMINE HCl, Sleep, 50 MG CAPS,  Take 50 mg by mouth at bedtime., Disp: , Rfl:  .  losartan (COZAAR) 25 MG tablet, Take 1 tablet (25 mg total) by mouth daily., Disp: 90 tablet, Rfl: 3 .  Magnesium ([employer] TRIPLE MAGNESIUM COMPLEX) 400 MG CAPS, Take 400 mg by mouth daily., Disp: , Rfl:  .  Naphazoline-Glycerin (CLEAR EYES REDNESS RELIEF OP), Place 1 drop into both eyes daily., Disp: , Rfl:  .  OVER THE COUNTER MEDICATION, Apply 1 application topically daily as needed (pain). Hemp cream, Disp: , Rfl:   I spent 30 minutes dedicated to the care of this patient on the date of this encounter to include pre-visit review of records, face-to-face time with the patient discussing conditions above, post visit ordering of testing, clinical documentation with the electronic health record, making appropriate referrals as documented, and communicating necessary findings to members of the patients care team.    Garner Nash, [facility] 03/22/2020 4:06 PM

## 2020-03-23 ENCOUNTER — Telehealth: Payer: Self-pay | Admitting: *Deleted

## 2020-03-23 ENCOUNTER — Telehealth (HOSPITAL_COMMUNITY): Payer: Self-pay

## 2020-03-23 ENCOUNTER — Encounter: Payer: Self-pay | Admitting: *Deleted

## 2020-03-23 ENCOUNTER — Encounter (HOSPITAL_COMMUNITY): Payer: Self-pay

## 2020-03-23 NOTE — Progress Notes (Signed)
  Oncology Nurse Navigator Documentation  Oncology Nurse Navigator Flowsheets 03/23/2020  Abnormal Finding Date 02/19/2020  Diagnosis Status Additional Work Up  Navigator Follow Up Date: 03/25/2020  Navigator Follow Up Reason: Other:  Navigator Location [facility]  Referral Date to RadOnc/MedOnc 02/24/2020  Navigator Encounter Type Clinic/MDC;Initial [facility] Date 03/18/2020  Multidisiplinary Clinic Type Thoracic  Patient Visit Type MedOnc;Initial  Treatment Phase Pre-Tx/Tx Discussion  Barriers/Navigation Needs Coordination of Care;Education  Education Other  Interventions Coordination of Care;Education;Psycho-Social Support  Acuity Level 2-Minimal Needs (1-2 Barriers Identified)  Coordination of Care Other  Education Method Verbal  Time Spent with Patient 45

## 2020-03-23 NOTE — Telephone Encounter (Signed)
I followed up on Joseph Moyer's schedule and his bx has not been set up. I called central scheduling and was able to obtain a date and time for his bx.  Radiology states they have tried to call him 3 times without response.  I called him today and was not able to reach. I did leave a vm message of bx and to call me for instructions and location.

## 2020-03-23 NOTE — Progress Notes (Signed)
Joseph Moyer Male, 80 y.o., [birth year]  MRN:  [id number] Phone:  [phone] Joseph Moyer)       PCP:  [name], MD Coverage:  [place] Hmo/Ppo  Next Appt With Cardiology 04/05/2020 at 3:00 PM           RE: Biopsy Received: Yesterday  Message Details  Joseph Cleveland, MD  Joseph Moyer   [place] core L ing LN  R/o lymphoma    [facility]    Previous Messages  ----- Message -----  From: Joseph Moyer  Sent: 03/19/2020  1:24 PM EST  To: [facility] Procedure Requests  Subject: Biopsy                      Procedure Requested: Ultrasound-guided core biopsy    Reason for Procedure: hypermetabolic left inguinal lymph node for tissue diagnosis.    Provider Requesting: Dr Curt Bears  Provider Telephone: [phone]    Other Info:

## 2020-03-24 ENCOUNTER — Telehealth: Payer: Self-pay | Admitting: *Deleted

## 2020-03-24 NOTE — Telephone Encounter (Signed)
I received a vm message from Joseph Moyer.  I called him back to update on bx.  I was unable to reach but did leave vm message to call me with an update.

## 2020-03-25 ENCOUNTER — Other Ambulatory Visit: Payer: Self-pay | Admitting: Radiology

## 2020-03-26 ENCOUNTER — Other Ambulatory Visit: Payer: Self-pay | Admitting: Radiology

## 2020-03-29 ENCOUNTER — Ambulatory Visit (HOSPITAL_COMMUNITY)
Admission: RE | Admit: 2020-03-29 | Discharge: 2020-03-29 | Disposition: A | Discharge status: Home / Self Care | Payer: Medicare HMO | Source: Ambulatory Visit | Attending: Internal Medicine | Admitting: Internal Medicine

## 2020-03-29 ENCOUNTER — Encounter (HOSPITAL_COMMUNITY): Payer: Self-pay

## 2020-03-29 ENCOUNTER — Other Ambulatory Visit: Payer: Self-pay

## 2020-03-29 DIAGNOSIS — E785 Hyperlipidemia, unspecified: Secondary | ICD-10-CM | POA: Diagnosis not present

## 2020-03-29 DIAGNOSIS — R590 Localized enlarged lymph nodes: Secondary | ICD-10-CM | POA: Diagnosis not present

## 2020-03-29 DIAGNOSIS — Z79899 Other long term (current) drug therapy: Secondary | ICD-10-CM | POA: Diagnosis not present

## 2020-03-29 DIAGNOSIS — I2510 Atherosclerotic heart disease of native coronary artery without angina pectoris: Secondary | ICD-10-CM | POA: Insufficient documentation

## 2020-03-29 DIAGNOSIS — Z7982 Long term (current) use of aspirin: Secondary | ICD-10-CM | POA: Insufficient documentation

## 2020-03-29 DIAGNOSIS — C8590 Non-Hodgkin lymphoma, unspecified, unspecified site: Secondary | ICD-10-CM | POA: Diagnosis not present

## 2020-03-29 DIAGNOSIS — C8585 Other specified types of non-Hodgkin lymphoma, lymph nodes of inguinal region and lower limb: Secondary | ICD-10-CM | POA: Diagnosis not present

## 2020-03-29 DIAGNOSIS — K5730 Diverticulosis of large intestine without perforation or abscess without bleeding: Secondary | ICD-10-CM | POA: Diagnosis not present

## 2020-03-29 DIAGNOSIS — Z87891 Personal history of nicotine dependence: Secondary | ICD-10-CM | POA: Diagnosis not present

## 2020-03-29 DIAGNOSIS — K8020 Calculus of gallbladder without cholecystitis without obstruction: Secondary | ICD-10-CM | POA: Diagnosis not present

## 2020-03-29 DIAGNOSIS — I10 Essential (primary) hypertension: Secondary | ICD-10-CM | POA: Diagnosis not present

## 2020-03-29 DIAGNOSIS — I700 Atherosclerosis of aorta: Secondary | ICD-10-CM | POA: Insufficient documentation

## 2020-03-29 DIAGNOSIS — R599 Enlarged lymph nodes, unspecified: Secondary | ICD-10-CM | POA: Diagnosis not present

## 2020-03-29 DIAGNOSIS — Z7901 Long term (current) use of anticoagulants: Secondary | ICD-10-CM | POA: Diagnosis not present

## 2020-03-29 LAB — CBC WITH DIFFERENTIAL/PLATELET
Abs Immature Granulocytes: 0.01 10*3/uL (ref 0.00–0.07)
Basophils Absolute: 0 10*3/uL (ref 0.0–0.1)
Basophils Relative: 0 %
Eosinophils Absolute: 0.1 10*3/uL (ref 0.0–0.5)
Eosinophils Relative: 1 %
HCT: 41.8 % (ref 39.0–52.0)
Hemoglobin: 13.5 g/dL (ref 13.0–17.0)
Immature Granulocytes: 0 %
Lymphocytes Relative: 66 %
Lymphs Abs: 5.6 10*3/uL — ABNORMAL HIGH (ref 0.7–4.0)
MCH: 28.9 pg (ref 26.0–34.0)
MCHC: 32.3 g/dL (ref 30.0–36.0)
MCV: 89.5 fL (ref 80.0–100.0)
Monocytes Absolute: 0.4 10*3/uL (ref 0.1–1.0)
Monocytes Relative: 5 %
Neutro Abs: 2.3 10*3/uL (ref 1.7–7.7)
Neutrophils Relative %: 28 %
Platelets: 214 10*3/uL (ref 150–400)
RBC: 4.67 MIL/uL (ref 4.22–5.81)
RDW: 16.4 % — ABNORMAL HIGH (ref 11.5–15.5)
WBC: 8.4 10*3/uL (ref 4.0–10.5)
nRBC: 0 % (ref 0.0–0.2)

## 2020-03-29 LAB — PROTIME-INR
INR: 1 (ref 0.8–1.2)
Prothrombin Time: 12.6 seconds (ref 11.4–15.2)

## 2020-03-29 LAB — PATHOLOGIST SMEAR REVIEW

## 2020-03-29 MED ORDER — FENTANYL CITRATE (PF) 100 MCG/2ML IJ SOLN
INTRAMUSCULAR | Status: AC
  Filled 2020-03-29: qty 2

## 2020-03-29 MED ORDER — SODIUM CHLORIDE 0.9 % IV SOLN: INTRAVENOUS | Status: DC

## 2020-03-29 MED ORDER — FENTANYL CITRATE (PF) 100 MCG/2ML IJ SOLN
INTRAMUSCULAR | Status: AC | PRN
Start: 2020-03-29 — End: 2020-03-29
  Administered 2020-03-29: 50 ug via INTRAVENOUS

## 2020-03-29 MED ORDER — LIDOCAINE HCL 1 % IJ SOLN
INTRAMUSCULAR | Status: AC
  Filled 2020-03-29: qty 20

## 2020-03-29 MED ORDER — MIDAZOLAM HCL 2 MG/2ML IJ SOLN
INTRAMUSCULAR | Status: AC
  Filled 2020-03-29: qty 2

## 2020-03-29 MED ORDER — MIDAZOLAM HCL 2 MG/2ML IJ SOLN
INTRAMUSCULAR | Status: AC | PRN
  Administered 2020-03-29: 1 mg via INTRAVENOUS

## 2020-03-29 NOTE — Consult Note (Addendum)
Chief Complaint: Patient was seen in consultation today for image guided left inguinal lymph node biopsy  Referring Physician(s): Mohamed,Mohamed  Supervising Physician: Mir, Biochemist, clinical  Patient Status: [facility] - Out-pt  History of Present Illness: Joseph Moyer is a 80 y.o. male with past medical history of osteoarthritis, hypertension, hyperlipidemia, prior tobacco use who recently underwent imaging work-up for dyspnea and was found to have extensive thoracic adenopathy highly suspicious for lymphoproliferative disorder. He underwent video bronchoscopy with endobronchial ultrasound procedure/bx on 03/12/2020 with final pathology showing benign lymphoid tissue. 03/08/20 PET scan revealed:  1. Deauville 3 and low Deauville 4 adenopathy in the neck and abdomen/pelvis, with Deauville 3 adenopathy in the chest. Wide distribution favors lymphoproliferative disease. Tissue diagnosis recommended. A left inguinal lymph node, although not as enlarged, is hypermetabolic and might present a reasonably superficial site for biopsy. 2. No splenomegaly or focal splenic lesion identified. 3. Accentuated activity in the posterolateral peripheral zones of the prostate gland at the apex, right greater than left, with maximum SUV of 8.1. Although nonspecific, prostate cancer is not excluded correlation with PSA level is suggested. 4. Accentuated activity along the soft palate is nonspecific but probably benign. 5. 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 6.  Aortic Atherosclerosis (ICD10-I70.0).  Coronary atherosclerosis. 7. Cholelithiasis. 8. Colonic diverticulosis. 9. Multilevel lumbar impingement  He has no prior history of malignancy. He presents today for image guided left inguinal lymph node biopsy for further evaluation.    Past Medical History:  Diagnosis Date  . Allergy   . Arthritis   . GERD (gastroesophageal reflux  disease)   . Hyperlipidemia   . Hypertension     Past Surgical History:  Procedure Laterality Date  . BRONCHIAL NEEDLE ASPIRATION BIOPSY  03/12/2020   Procedure: BRONCHIAL NEEDLE ASPIRATION BIOPSIES;  Surgeon: Garner Nash, DO;  Location: [facility];  Service: Pulmonary;;  . CIRCUMCISION    . COLONOSCOPY WITH PROPOFOL N/A 05/10/2015   Procedure: COLONOSCOPY WITH PROPOFOL;  Surgeon: Garlan Fair, MD;  Location: [facility];  Service: Endoscopy;  Laterality: N/A;  . DECOMPRESSIVE LUMBAR LAMINECTOMY LEVEL 1 N/A 06/15/2018   Procedure: Gordy Levan decompression L1-L2/ complete inferior facetectomyof L1. Posterior lateral arthrodesiswith autograft bone L1-L2.;  Surgeon: Melina Schools, MD;  Location: [facility];  Service: Orthopedics;  Laterality: N/A;  . HEMORRHOID SURGERY    . VIDEO BRONCHOSCOPY WITH ENDOBRONCHIAL ULTRASOUND N/A 03/12/2020   Procedure: VIDEO BRONCHOSCOPY WITH ENDOBRONCHIAL ULTRASOUND;  Surgeon: Garner Nash, DO;  Location: [facility];  Service: Pulmonary;  Laterality: N/A;    Allergies: Oxycodone, Ramipril, and Rosuvastatin  Medications: Prior to Admission medications   Medication Sig Start Date End Date Taking? Authorizing Provider  amLODipine (NORVASC) 10 MG tablet Take 1 tablet (10 mg total) by mouth daily. 02/24/20 05/24/20  Freada Bergeron, MD  aspirin 81 MG tablet Take 81 mg by mouth every morning.     [provider]  atorvastatin (LIPITOR) 40 MG tablet Take 1 tablet (40 mg total) by mouth daily. 02/24/20 05/24/20  Freada Bergeron, MD  diphenhydrAMINE HCl, Sleep, 50 MG CAPS Take 50 mg by mouth at bedtime.    [provider]  losartan (COZAAR) 25 MG tablet Take 1 tablet (25 mg total) by mouth daily. 02/24/20 05/24/20  Freada Bergeron, MD  Magnesium ([employer] TRIPLE MAGNESIUM COMPLEX) 400 MG CAPS Take 400 mg by mouth daily.    [provider]  Naphazoline-Glycerin (CLEAR EYES REDNESS RELIEF OP)  Place 1 drop into both eyes daily.     [provider]  OVER THE COUNTER MEDICATION Apply 1 application topically daily as needed (pain). Hemp cream    [provider]     Family History  Problem Relation Age of Onset  . Diabetes Mother   . Diabetes Sister   . Diabetes Brother     Social History   Socioeconomic History  . Marital status: Married    Spouse name: Not on file  . Number of children: Not on file  . Years of education: Not on file  . Highest education level: Not on file  Occupational History  . Not on file  Tobacco Use  . Smoking status: Former Smoker    Quit date: 1994    Years since quitting: 28.1  . Smokeless tobacco: Never Used  Vaping Use  . Vaping Use: Never used  Substance and Sexual Activity  . Alcohol use: Yes    Alcohol/week: 0.0 standard drinks    Comment: occassionally  . Drug use: No  . Sexual activity: Not on file  Other Topics Concern  . Not on file  Social History Narrative  . Not on file   Social Determinants of Health   Financial Resource Strain: Not on file  Food Insecurity: Not on file  Transportation Needs: Not on file  Physical Activity: Not on file  Stress: Not on file  Social Connections: Not on file     Review of Systems denies fever,HA,CP,dyspnea, cough, abd/back pain,N/V or bleeding  Vital Signs: Vitals:   03/29/20 1203  BP: 136/87  Pulse: 81  Resp: 18  Temp: 98.1 F (36.7 C)  SpO2: 100%      Physical Exam awake, alert.  Chest clear to auscultation bilaterally.  Heart with regular rate and rhythm, ?soft murmur.  Abdomen soft, positive bowel sounds, nontender.  No lower extremity edema.  Imaging: NM PET Image Initial (PI) Skull Base To Thigh  Result Date: 03/08/2020 CLINICAL DATA:  Initial treatment strategy for thoracic adenopathy and suspicion for lymphoproliferative disease. EXAM: NUCLEAR MEDICINE PET SKULL BASE TO THIGH TECHNIQUE: 10.7 mCi F-18 FDG was injected intravenously. Full-ring PET imaging was performed from the skull  base to thigh after the radiotracer. CT data was obtained and used for attenuation correction and anatomic localization. Fasting blood glucose: 106 mg/dl COMPARISON:  Chest CT 02/19/2020 FINDINGS: Mediastinal blood pool activity: SUV max 2.4 Liver activity: SUV max 3.7 NECK: Mildly hypermetabolic level IIa, level II, level III, level V, and level Ib adenopathy observed. Index right level IIa lymph node measures 1.3 cm in short axis on image 29 of series 4 with maximum SUV 4.2, Deauville 4. Index left level V lymph node measures 1.2 cm in short axis on image 43 of series 4 with maximum SUV of 2.5, Deauville 3. Mildly accentuated activity along the soft palate, maximum SUV 6.7, Deauville 4. Incidental CT findings: Anterior to the left lobe of the thyroid, a cystic lesion does not have substantial accentuated metabolic activity and was shown on the prior thyroid ultrasound of 10/02/2000. This is not felt to be within the thyroid gland itself. CHEST: Multiple small and mildly enlarged mediastinal and axillary lymph nodes are present. One of the larger right paratracheal nodes measures 1.5 cm in short axis on image 68 of series 4 with maximum SUV 2.6, Deauville 3. Subcarinal node 1.3 cm in short axis on image 79 of series 4 with maximum SUV 3.5, Deauville 3. A right axillary node with  fatty hilum and short axis diameter of about 1.2 cm on image 61 of series 4 (excluding the flat fatty hilum) has a maximum SUV of 3.2, Deauville 3. Incidental CT findings: Atherosclerotic calcification of the thoracic aorta, right coronary artery, and left anterior descending coronary artery. ABDOMEN/PELVIS: No splenomegaly or focal hypermetabolic splenic activity. Mildly hypermetabolic enlarged retroperitoneal and pelvic lymph nodes are present. Index left periaortic lymph node 1.3 cm in short axis on image 144 of series 4 with maximum SUV 3.2, Deauville 3. Index left external iliac lymph node 1.2 cm in short axis on image 187 of series 4  with maximum SUV of 3.9, Deauville 4. Numerous small Deauville 2 and overall 3 mesenteric lymph nodes are present. A left inguinal node with a short axis diameter 1.1 cm on image 197 of series 4 has a maximum SUV of 5.2, Deauville 4. There is some accentuated activity in the posterolateral peripheral zone of the prostate gland at the apex, more notable on the right with maximum SUV of 8.1 Incidental CT findings: Cholelithiasis. Aortoiliac atherosclerotic vascular disease. Colonic diverticulosis. Nondistended urinary bladder. Distal right iloipsoas lipoma. SKELETON: No significant abnormal hypermetabolic activity in this region. Incidental CT findings: Posterolateral rod and pedicle screw fixation at L1-2. Lower lumbar spondylosis and degenerative disc disease likely causing multilevel impingement. IMPRESSION: 1. Deauville 3 and low Deauville 4 adenopathy in the neck and abdomen/pelvis, with Deauville 3 adenopathy in the chest. Wide distribution favors lymphoproliferative disease. Tissue diagnosis recommended. A left inguinal lymph node, although not as enlarged, is hypermetabolic and might present a reasonably superficial site for biopsy. 2. No splenomegaly or focal splenic lesion identified. 3. Accentuated activity in the posterolateral peripheral zones of the prostate gland at the apex, right greater than left, with maximum SUV of 8.1. Although nonspecific, prostate cancer is not excluded correlation with PSA level is suggested. 4. Accentuated activity along the soft palate is nonspecific but probably benign. 5. 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 6.  Aortic Atherosclerosis (ICD10-I70.0).  Coronary atherosclerosis. 7. Cholelithiasis. 8. Colonic diverticulosis. 9. Multilevel lumbar impingement. Electronically Signed   By: Van Clines M.D.   On: 03/08/2020 10:22    Labs:  CBC: Recent Labs    02/24/20 1542 03/18/20 1430  WBC 8.5 7.9   HGB 12.9* 11.5*  HCT 39.6 34.0*  PLT 181 139*    COAGS: No results for input(s): INR, APTT in the last 8760 hours.  BMP: Recent Labs    02/05/20 1503 03/12/20 0652 03/18/20 1430  NA 139 136 138  K 4.2 4.3 4.2  CL 102 103 103  CO2 24 23 28   GLUCOSE 114* 101* 121*  BUN 11 22 16   CALCIUM 9.4 9.0 9.1  CREATININE 0.86 0.94 1.08  GFRNONAA 84 >60 >60  GFRAA 97  --   --     LIVER FUNCTION TESTS: Recent Labs    03/18/20 1430  BILITOT 0.3  AST 16  ALT 18  ALKPHOS 51  PROT 7.5  ALBUMIN 4.0    TUMOR MARKERS: No results for input(s): AFPTM, CEA, CA199, CHROMGRNA in the last 8760 hours.  Assessment and Plan: 80 y.o. male with past medical history of osteoarthritis, hypertension, hyperlipidemia, prior tobacco use who recently underwent imaging work-up for dyspnea and was found to have extensive thoracic adenopathy highly suspicious for lymphoproliferative disorder. He underwent video bronchoscopy with endobronchial ultrasound procedure/bx on 03/12/2020 with final pathology showing benign lymphoid tissue. 03/08/20 PET scan revealed:  1. Deauville 3 and low Deauville 4 adenopathy in the neck and abdomen/pelvis, with Deauville 3 adenopathy in the chest. Wide distribution favors lymphoproliferative disease. Tissue diagnosis recommended. A left inguinal lymph node, although not as enlarged, is hypermetabolic and might present a reasonably superficial site for biopsy. 2. No splenomegaly or focal splenic lesion identified. 3. Accentuated activity in the posterolateral peripheral zones of the prostate gland at the apex, right greater than left, with maximum SUV of 8.1. Although nonspecific, prostate cancer is not excluded correlation with PSA level is suggested. 4. Accentuated activity along the soft palate is nonspecific but probably benign. 5. 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 6.  Aortic Atherosclerosis  (ICD10-I70.0).  Coronary atherosclerosis. 7. Cholelithiasis. 8. Colonic diverticulosis. 9. Multilevel lumbar impingement  He has no prior history of malignancy. He presents today for image guided left inguinal lymph node biopsy for further evaluation.Risks and benefits of procedure was discussed with the patient  including, but not limited to bleeding, infection, damage to adjacent structures or low yield requiring additional tests.  All of the questions were answered and there is agreement to proceed.  Consent signed and in chart.     Thank you for this interesting consult.  I greatly enjoyed meeting Joseph Moyer and look forward to participating in their care.  A copy of this report was sent to the requesting provider on this date.  Electronically Signed: D. Rowe Robert, PA-C 03/29/2020, 11:51 AM   I spent a total of  20 minutes   in face to face in clinical consultation, greater than 50% of which was counseling/coordinating care for image guided left inguinal lymph node biopsy

## 2020-03-29 NOTE — Procedures (Signed)
Interventional Radiology Procedure Note  Procedure: US guided lymph node biopsy  Indication: FDG avid left inguinal lymph node  Findings: Please refer to procedural dictation for full description.  Complications: None  EBL: < 10 mL  Miachel Roux, MD [phone]

## 2020-03-29 NOTE — Discharge Instructions (Signed)
Please call Interventional Radiology clinic [phone] with any questions or concerns.  You may remove your dressing and shower tomorrow.   Needle Biopsy, Care After These instructions tell you how to care for yourself after your procedure. Your doctor may also give you more specific instructions. Call your doctor if you have any problems or questions. What can I expect after the procedure? After the procedure, it is common to have:  Soreness.  Bruising.  Mild pain. Follow these instructions at home:  Return to your normal activities as told by your doctor. Ask your doctor what activities are safe for you.  Take over-the-counter and prescription medicines only as told by your doctor.  Wash your hands with soap and water before you change your bandage (dressing). If you cannot use soap and water, use hand sanitizer.  Follow instructions from your doctor about: ? How to take care of your puncture site. ? When and how to change your bandage. ? When to remove your bandage.  Check your puncture site every day for signs of infection. Watch for: ? Redness, swelling, or pain. ? Fluid or blood. ? Pus or a bad smell. ? Warmth.  Do not take baths, swim, or use a hot tub until your doctor approves. Ask your doctor if you may take showers. You may only be allowed to take sponge baths.  Keep all follow-up visits as told by your doctor. This is important.   Contact a doctor if you have:  A fever.  Redness, swelling, or pain at the puncture site, and it lasts longer than a few days.  Fluid, blood, or pus coming from the puncture site.  Warmth coming from the puncture site. Get help right away if:  You have a lot of bleeding from the puncture site. Summary  After the procedure, it is common to have soreness, bruising, or mild pain at the puncture site.  Check your puncture site every day for signs of infection, such as redness, swelling, or pain.  Get help right away if you  have severe bleeding from your puncture site. This information is not intended to replace advice given to you by your health care provider. Make sure you discuss any questions you have with your health care provider. Document Revised: 07/17/2019 Document Reviewed: 07/17/2019 Elsevier Patient Education  2021 [employer] Inc.   Moderate Conscious Sedation, Adult, Care After This sheet gives you information about how to care for yourself after your procedure. Your health care provider may also give you more specific instructions. If you have problems or questions, contact your health care provider. What can I expect after the procedure? After the procedure, it is common to have:  Sleepiness for several hours.  Impaired judgment for several hours.  Difficulty with balance.  Vomiting if you eat too soon. Follow these instructions at home: For the time period you were told by your health care provider:  Rest.  Do not participate in activities where you could fall or become injured.  Do not drive or use machinery.  Do not drink alcohol.  Do not take sleeping pills or medicines that cause drowsiness.  Do not make important decisions or sign legal documents.  Do not take care of children on your own.      Eating and drinking  Follow the diet recommended by your health care provider.  Drink enough fluid to keep your urine pale yellow.  If you vomit: ? Drink water, juice, or soup when you can drink without vomiting. ?   Make sure you have little or no nausea before eating solid foods.   General instructions  Take over-the-counter and prescription medicines only as told by your health care provider.  Have a responsible adult stay with you for the time you are told. It is important to have someone help care for you until you are awake and alert.  Do not smoke.  Keep all follow-up visits as told by your health care provider. This is important. Contact a health care provider  if:  You are still sleepy or having trouble with balance after 24 hours.  You feel light-headed.  You keep feeling nauseous or you keep vomiting.  You develop a rash.  You have a fever.  You have redness or swelling around the IV site. Get help right away if:  You have trouble breathing.  You have new-onset confusion at home. Summary  After the procedure, it is common to feel sleepy, have impaired judgment, or feel nauseous if you eat too soon.  Rest after you get home. Know the things you should not do after the procedure.  Follow the diet recommended by your health care provider and drink enough fluid to keep your urine pale yellow.  Get help right away if you have trouble breathing or new-onset confusion at home. This information is not intended to replace advice given to you by your health care provider. Make sure you discuss any questions you have with your health care provider. Document Revised: 05/16/2019 Document Reviewed: 12/12/2018 Elsevier Patient Education  2021 Elsevier Inc.   

## 2020-03-31 ENCOUNTER — Telehealth: Payer: Self-pay | Admitting: *Deleted

## 2020-03-31 LAB — SURGICAL PATHOLOGY

## 2020-03-31 NOTE — Telephone Encounter (Signed)
Mr. Carden called and left a vm message. I called him back.  He wanted an update on his path.  I updated that not all the results are completed.  He was thankful for the call and update.

## 2020-04-01 ENCOUNTER — Telehealth: Payer: Self-pay | Admitting: *Deleted

## 2020-04-01 NOTE — Telephone Encounter (Signed)
I received a message from Dr. Julien Nordmann updating on pathology and he would like to discuss with patient. I called Joseph Moyer to set him up with an appt but was unable to reach. I did leave vm message with my name and phone number to call.

## 2020-04-01 NOTE — Telephone Encounter (Signed)
I received a call from Joseph Moyer.  I updated him that Dr. Julien Nordmann would like to see him to discuss pathology.

## 2020-04-05 ENCOUNTER — Other Ambulatory Visit: Payer: Medicare HMO | Admitting: *Deleted

## 2020-04-05 ENCOUNTER — Other Ambulatory Visit: Payer: Self-pay

## 2020-04-05 DIAGNOSIS — E785 Hyperlipidemia, unspecified: Secondary | ICD-10-CM

## 2020-04-05 DIAGNOSIS — I2510 Atherosclerotic heart disease of native coronary artery without angina pectoris: Secondary | ICD-10-CM

## 2020-04-06 ENCOUNTER — Other Ambulatory Visit: Payer: Self-pay | Admitting: Dermatology

## 2020-04-06 LAB — LIPID PANEL
Chol/HDL Ratio: 2.7 ratio (ref 0.0–5.0)
Cholesterol, Total: 139 mg/dL (ref 100–199)
HDL: 52 mg/dL (ref >39)
LDL Chol Calc (NIH): 64 mg/dL (ref 0–99)
Triglycerides: 134 mg/dL (ref 0–149)
VLDL Cholesterol Cal: 23 mg/dL (ref 5–40)

## 2020-04-07 ENCOUNTER — Encounter: Payer: Self-pay | Admitting: Internal Medicine

## 2020-04-07 ENCOUNTER — Telehealth: Payer: Self-pay | Admitting: Internal Medicine

## 2020-04-07 ENCOUNTER — Inpatient Hospital Stay: Payer: Medicare HMO | Attending: Internal Medicine | Admitting: Internal Medicine

## 2020-04-07 ENCOUNTER — Other Ambulatory Visit: Payer: Self-pay

## 2020-04-07 VITALS — BP 131/73 | HR 84 | Temp 97.4°F | Resp 18 | Ht 71.0 in | Wt 219.2 lb

## 2020-04-07 DIAGNOSIS — C8308 Small cell B-cell lymphoma, lymph nodes of multiple sites: Secondary | ICD-10-CM | POA: Insufficient documentation

## 2020-04-07 DIAGNOSIS — R5383 Other fatigue: Secondary | ICD-10-CM | POA: Diagnosis not present

## 2020-04-07 DIAGNOSIS — C8300 Small cell B-cell lymphoma, unspecified site: Secondary | ICD-10-CM | POA: Diagnosis not present

## 2020-04-07 NOTE — Telephone Encounter (Signed)
Scheduled appointments per 3/9 los. Spoke to patient who is aware of appointments date and times. Gave patient calendar print out.

## 2020-04-07 NOTE — Progress Notes (Signed)
[facility] [phone]   [phone]  OFFICE PROGRESS NOTE  [name], MD 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  PRIOR THERAPY: None  CURRENT THERAPY: Observation.  INTERVAL HISTORY: Joseph Moyer 80 y.o. male returns to the clinic today for follow-up visit.  The patient is feeling fine today with no concerning complaints except for mild fatigue.  He denied having any palpable lymphadenopathy.  He denied having any weight loss or night sweats.  He has no nausea, vomiting, diarrhea or constipation.  He has no headache or visual changes.  He denied having any chest pain, shortness of breath, cough or hemoptysis.  The patient had ultrasound-guided core biopsy of hypermetabolic and enlarged left inguinal lymph node by interventional radiology and the final pathology ([id number]) was consistent with small lymphocytic lymphoma.  The patient is here today for evaluation and discussion of his treatment options.  MEDICAL HISTORY: Past Medical History:  Diagnosis Date  . Allergy   . Arthritis   . GERD (gastroesophageal reflux disease)   . Hyperlipidemia   . Hypertension     ALLERGIES:  is allergic to oxycodone, ramipril, and rosuvastatin.  MEDICATIONS:  Current Outpatient Medications  Medication Sig Dispense Refill  . amLODipine (NORVASC) 10 MG tablet Take 1 tablet (10 mg total) by mouth daily. 90 tablet 3  . aspirin 81 MG tablet Take 81 mg by mouth every morning.     Marland Kitchen atorvastatin (LIPITOR) 40 MG tablet Take 1 tablet (40 mg total) by mouth daily. 90 tablet 3  . diphenhydrAMINE HCl, Sleep, 50 MG CAPS Take 50 mg by mouth at bedtime.    Marland Kitchen losartan (COZAAR) 25 MG tablet Take 1 tablet (25 mg total) by mouth daily. 90 tablet 3  . Magnesium ([employer]S TRIPLE  MAGNESIUM COMPLEX) 400 MG CAPS Take 400 mg by mouth daily.    . Naphazoline-Glycerin (CLEAR EYES REDNESS RELIEF OP) Place 1 drop into both eyes daily.    Marland Kitchen OVER THE COUNTER MEDICATION Apply 1 application topically daily as needed (pain). Hemp cream     No current facility-administered medications for this visit.    SURGICAL HISTORY:  Past Surgical History:  Procedure Laterality Date  . BRONCHIAL NEEDLE ASPIRATION BIOPSY  03/12/2020   Procedure: BRONCHIAL NEEDLE ASPIRATION BIOPSIES;  Surgeon: Garner Nash, DO;  Location: [facility];  Service: Pulmonary;;  . CIRCUMCISION    . COLONOSCOPY WITH PROPOFOL N/A 05/10/2015   Procedure: COLONOSCOPY WITH PROPOFOL;  Surgeon: Garlan Fair, MD;  Location: [facility];  Service: Endoscopy;  Laterality: N/A;  . DECOMPRESSIVE LUMBAR LAMINECTOMY LEVEL 1 N/A 06/15/2018   Procedure: Gordy Levan decompression L1-L2/ complete inferior facetectomyof L1. Posterior lateral arthrodesiswith autograft bone L1-L2.;  Surgeon: Melina Schools, MD;  Location: [facility];  Service: Orthopedics;  Laterality: N/A;  . HEMORRHOID SURGERY    . VIDEO BRONCHOSCOPY WITH ENDOBRONCHIAL ULTRASOUND N/A 03/12/2020   Procedure: VIDEO BRONCHOSCOPY WITH ENDOBRONCHIAL ULTRASOUND;  Surgeon: Garner Nash, DO;  Location: [facility];  Service: Pulmonary;  Laterality: N/A;    REVIEW OF SYSTEMS:  Constitutional: positive for fatigue Eyes: negative Ears, nose, mouth, throat, and face: negative Respiratory: negative Cardiovascular: negative Gastrointestinal: negative Genitourinary:negative Integument/breast: negative Hematologic/lymphatic: negative Musculoskeletal:negative Neurological: negative Behavioral/Psych: negative Endocrine: negative Allergic/Immunologic: negative   PHYSICAL EXAMINATION: General appearance: alert, cooperative, fatigued and no distress Head:  Normocephalic, without obvious abnormality, atraumatic Neck: no adenopathy, no JVD, supple, symmetrical, trachea midline  and thyroid not enlarged, symmetric, no tenderness/mass/nodules Lymph nodes: Cervical, supraclavicular, and axillary nodes normal. Resp: clear to auscultation bilaterally Back: symmetric, no curvature. ROM normal. No CVA tenderness. Cardio: regular rate and rhythm, S1, S2 normal, no murmur, click, rub or gallop GI: soft, non-tender; bowel sounds normal; no masses,  no organomegaly Extremities: extremities normal, atraumatic, no cyanosis or edema Neurologic: Alert and oriented X 3, normal strength and tone. Normal symmetric reflexes. Normal coordination and gait  ECOG PERFORMANCE STATUS: 1 - Symptomatic but completely ambulatory  Blood pressure 131/73, pulse 84, temperature (!) 97.4 F (36.3 C), temperature source Tympanic, resp. rate 18, height 5\' 11"  (1.803 m), weight 219 lb 3.2 oz (99.4 kg), SpO2 100 %.  LABORATORY DATA: Lab Results  Component Value Date   WBC 8.4 03/29/2020   HGB 13.5 03/29/2020   HCT 41.8 03/29/2020   MCV 89.5 03/29/2020   PLT 214 03/29/2020      Chemistry      Component Value Date/Time   NA 138 03/18/2020 1430   NA 139 02/05/2020 1503   K 4.2 03/18/2020 1430   CL 103 03/18/2020 1430   CO2 28 03/18/2020 1430   BUN 16 03/18/2020 1430   BUN 11 02/05/2020 1503   CREATININE 1.08 03/18/2020 1430      Component Value Date/Time   CALCIUM 9.1 03/18/2020 1430   ALKPHOS 51 03/18/2020 1430   AST 16 03/18/2020 1430   ALT 18 03/18/2020 1430   BILITOT 0.3 03/18/2020 1430       RADIOGRAPHIC STUDIES: [place] CORE BIOPSY (LYMPH NODES)  Result Date: 03/29/2020 INDICATION: Hypermetabolic left inguinal lymph node EXAM: ULTRASOUND GUIDED CORE BIOPSY OF FDG AVID LEFT INGUINAL LEFT MEDICATIONS: None. ANESTHESIA/SEDATION: Fentanyl 50 mcg IV; Versed 1 mg IV Moderate Sedation Time:  10 minutes The patient was continuously monitored during the procedure by the interventional radiology nurse under my direct supervision. PROCEDURE: The procedure, risks, benefits, and alternatives  were explained to the patient. Questions regarding the procedure were encouraged and answered. The patient understands and consents to the procedure. Patient position supine on the ultrasound table. The left inguinal skin was prepped with chlorhexidine in a sterile fashion, and a sterile drape was applied covering the operative field. A sterile gown and sterile gloves were used for the procedure. Local anesthesia was provided with 1% Lidocaine. Following local lidocaine administration, four 16 gauge cores were obtained from the FDG avid left inguinal lymph node utilizing continuous ultrasound guidance. Samples were sent to pathology in sterile saline. Needle removed and hemostasis achieved with 2 minutes of manual compression. Post procedure ultrasound images showed no evidence of significant hemorrhage. COMPLICATIONS: None immediate. FINDINGS: Ultrasound-guided biopsy of enlarged left inguinal lymph node. COMPLICATIONS: None immediate. Electronically Signed   By: Miachel Roux M.D.   On: 03/29/2020 15:40    ASSESSMENT AND PLAN: This is a very pleasant 80 years old African-American male recently diagnosed with a small lymphocytic lymphoma with generalized lymphadenopathy in the neck, chest, axilla, abdomen and pelvis. The recent core biopsy of the left inguinal lymph node was consistent with the above diagnosis. I had a lengthy discussion with the patient today about his condition and treatment options.  The patient is currently asymptomatic.  I recommended for him to continue on observation and close monitoring. He agreed to the current plan. I will see him back for follow-up visit in around 6 months with repeat CT scan  of the chest, abdomen pelvis for restaging of his disease. The patient was advised to call immediately if he has any concerning symptoms in the interval. The patient voices understanding of current disease status and treatment options and is in agreement with the current care plan.  All  questions were answered. The patient knows to call the clinic with any problems, questions or concerns. We can certainly see the patient much sooner if necessary.  The total time spent in the appointment was 30 minutes.  Disclaimer: This note was dictated with voice recognition software. Similar sounding words can inadvertently be transcribed and may not be corrected upon review.

## 2020-04-17 ENCOUNTER — Other Ambulatory Visit (HOSPITAL_COMMUNITY)
Admission: RE | Admit: 2020-04-17 | Discharge: 2020-04-17 | Disposition: A | Discharge status: Home / Self Care | Payer: Medicare HMO | Source: Ambulatory Visit | Attending: Adult Health | Admitting: Adult Health

## 2020-04-17 DIAGNOSIS — Z20822 Contact with and (suspected) exposure to covid-19: Secondary | ICD-10-CM | POA: Insufficient documentation

## 2020-04-17 DIAGNOSIS — Z01812 Encounter for preprocedural laboratory examination: Secondary | ICD-10-CM | POA: Diagnosis not present

## 2020-04-17 LAB — SARS CORONAVIRUS 2 (TAT 6-24 HRS): SARS Coronavirus 2: NEGATIVE

## 2020-04-20 ENCOUNTER — Ambulatory Visit (INDEPENDENT_AMBULATORY_CARE_PROVIDER_SITE_OTHER): Payer: Medicare HMO | Admitting: Pulmonary Disease

## 2020-04-20 ENCOUNTER — Ambulatory Visit: Payer: Medicare HMO | Admitting: Adult Health

## 2020-04-20 ENCOUNTER — Encounter: Payer: Self-pay | Admitting: Adult Health

## 2020-04-20 ENCOUNTER — Other Ambulatory Visit: Payer: Self-pay

## 2020-04-20 DIAGNOSIS — J439 Emphysema, unspecified: Secondary | ICD-10-CM | POA: Diagnosis not present

## 2020-04-20 DIAGNOSIS — R0602 Shortness of breath: Secondary | ICD-10-CM | POA: Diagnosis not present

## 2020-04-20 DIAGNOSIS — R9389 Abnormal findings on diagnostic imaging of other specified body structures: Secondary | ICD-10-CM | POA: Diagnosis not present

## 2020-04-20 LAB — PULMONARY FUNCTION TEST
DL/VA % pred: 127 %
DL/VA: 4.97 ml/min/mmHg/L
DLCO cor % pred: 91 %
DLCO cor: 23.75 ml/min/mmHg
DLCO unc % pred: 82 %
DLCO unc: 21.38 ml/min/mmHg
FEF 25-75 Post: 2.7 L/sec
FEF 25-75 Pre: 1.81 L/sec
FEF2575-%Change-Post: 48 %
FEF2575-%Pred-Post: 115 %
FEF2575-%Pred-Pre: 77 %
FEV1-%Change-Post: 8 %
FEV1-%Pred-Post: 73 %
FEV1-%Pred-Pre: 68 %
FEV1-Post: 2.15 L
FEV1-Pre: 1.99 L
FEV1FVC-%Change-Post: 2 %
FEV1FVC-%Pred-Pre: 103 %
FEV6-%Change-Post: 5 %
FEV6-%Pred-Post: 72 %
FEV6-%Pred-Pre: 68 %
FEV6-Post: 2.69 L
FEV6-Pre: 2.55 L
FEV6FVC-%Pred-Post: 105 %
FEV6FVC-%Pred-Pre: 105 %
FVC-%Change-Post: 5 %
FVC-%Pred-Post: 68 %
FVC-%Pred-Pre: 65 %
FVC-Post: 2.69 L
FVC-Pre: 2.56 L
Post FEV1/FVC ratio: 80 %
Post FEV6/FVC ratio: 100 %
Pre FEV1/FVC ratio: 78 %
Pre FEV6/FVC Ratio: 100 %
RV % pred: 80 %
RV: 2.15 L
TLC % pred: 69 %
TLC: 5.13 L

## 2020-04-20 MED ORDER — SPIRIVA RESPIMAT 2.5 MCG/ACT IN AERS: 2.0000 | INHALATION_SPRAY | Freq: Every day | RESPIRATORY_TRACT | 5 refills | Status: DC

## 2020-04-20 NOTE — Progress Notes (Signed)
PCCM:  Thanks for seeing him  I am glad they established a diagnosis  Garner Nash, DO [facility] 04/20/2020 4:51 PM

## 2020-04-20 NOTE — Patient Instructions (Signed)
Pulmonary function test performed today. 

## 2020-04-20 NOTE — Patient Instructions (Addendum)
Activity as tolerated  Follow up with Oncology Dr Julien Nordmann as planned with scans later this year  Begin Spiriva 2 puffs daily , rinse after use.  Refer to Urology for abnormal prostate results on PET scan .  Follow up with Dr. Valeta Harms in 3 months and As needed   Please contact office for sooner follow up if symptoms do not improve or worsen or seek emergency care

## 2020-04-20 NOTE — Assessment & Plan Note (Signed)
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  Pulmonary function testing today shows no significant obstruction.  There is some mild to moderate restriction.  Patient does have some shortness of breath with activities but remains pretty active for his age.  Continues to work full-time.  Has no significant cough or wheezing. A trial of Spiriva to see if this helps with his shortness of breath.  Plan  Patient Instructions  Activity as tolerated  Follow up with Oncology Dr Julien Nordmann as planned with scans later this year  Begin Spiriva 2 puffs daily , rinse after use.  Refer to Urology for abnormal prostate results on PET scan .  Follow up with Dr. Valeta Harms in 3 months and As needed   Please contact office for sooner follow up if symptoms do not improve or worsen or seek emergency care

## 2020-04-20 NOTE — Progress Notes (Signed)
Full PFT performed today. °

## 2020-04-20 NOTE — Assessment & Plan Note (Signed)
Abnormal CT chest with diffuse lymphadenopathy.  Recent work-up revealed after inguinal biopsy small lymphocytic lymphoma.  Patient is continue follow-up with oncology.  Incidental findings on PET scan showed abnormal activity in the prostate gland.  A referral to urology has been placed.

## 2020-04-20 NOTE — Progress Notes (Signed)
@Patient  ID: Joseph Moyer, male    DOB: [birth year], 80 y.o.   MRN: [id number]  Chief Complaint  Patient presents with  . Follow-up    Referring provider: [name], MD  HPI: 80 year old male seen for pulmonary consult January 2022 for abnormal CT chest that showed bilateral adenopathy, precarinal node measuring 1.8 cm, patient  bronchoscopy with flow cytometry that showed predominant lymphocytes.  Patient then underwent an ultrasound-guided biopsy of a hypermetabolic enlarged left inguinal node with final pathology consistent with a small lymphocytic lymphoma.   TEST/EVENTS :  PET scan 03/08/20  Deauville 3 and low Deauville 4 adenopathy in the neck and abdomen/pelvis, with Deauville 3 adenopathy in the chest. Wide distribution favors lymphoproliferative disease. Tissue diagnosis recommended. A left inguinal lymph node, although not as enlarged, is hypermetabolic and might present a reasonably superficial site for biopsy. 2. No splenomegaly or focal splenic lesion identified. 3. Accentuated activity in the posterolateral peripheral zones of the prostate gland at the apex, right greater than left, with maximum SUV of 8.1. Although nonspecific, prostate cancer is not excluded correlation with PSA level is suggested. 4. Accentuated activity along the soft palate is nonspecific but probably benign. 5. 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 6.  Aortic Atherosclerosis (ICD10-I70.0).  Coronary atherosclerosis. 7. Cholelithiasis. 8. Colonic diverticulosis. 9. Multilevel lumbar impingement.  02/20/20  CT chest  Extensive thoracic adenopathy, highly suspicious for lymphoproliferative process. Consider multidisciplinary thoracic oncology consultation for eventual PET and tissue sampling. 2. Possible soft tissue fullness about the low left neck. This may represent adenopathy. Recommend attention on follow-up. 3.  Upper normal sized upper abdominal nodes, indeterminate. 4. 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   04/20/2020 Follow up : Dyspnea Patient presents for a 1 month follow-up.  Patient was seen earlier this year for Abnormal CT chest.  This showed extensive thoracic adenopathy.  A subsequent PET scan showed hypermetabolic adenopathy.  Bronchoscopy showed flow cytometry positive for predominant lymphocytes.  Patient was set up for an ultrasound-guided inguinal biopsy that confirmed on final pathology as small lymphocytic lymphoma.  Patient has been seen by oncology Dr. Earlie Server.  Currently is going to be under observation.  With plan to repeat scans in September. Patient says he is active.  He continues to work full-time.  Does get short of breath with activities.  He is a former smoker.  He denies any cough or wheezing.  Carries no previous diagnosis of COPD or asthma.  Patient underwent pulmonary function testing today that showed mild to moderate restriction with an FEV1 at 73%, ratio 80, FVC 68%, no significant bronchodilator response, DLCO 82%.  Previous CT showed emphysema.   Allergies  Allergen Reactions  . Oxycodone Itching  . Ramipril     Swelling under eyes  . Rosuvastatin Itching    Pt reports this medication causes him to itch.     Immunization History  Administered Date(s) Administered  . Influenza Split 11/18/2008, 12/26/2009, 11/18/2010, 11/27/2011, 11/12/2012, 12/08/2013, 11/04/2016, 11/13/2019  . Influenza,inj,quad, With Preservative 10/28/2018  . Influenza-Unspecified 10/16/2016  . PFIZER(Purple Top)SARS-COV-2 Vaccination 04/01/2019, 04/15/2019, 11/15/2019  . Pneumococcal Conjugate-13 04/11/2013  . Pneumococcal Polysaccharide-23 02/23/2007  . Td 02/22/2006  . Zoster 10/15/2013    Past Medical History:  Diagnosis Date  . Allergy   . Arthritis   . GERD (gastroesophageal reflux disease)   . Hyperlipidemia   .  Hypertension     Tobacco History: Social History  Tobacco Use  Smoking Status Former Smoker  . Quit date: 72  . Years since quitting: 28.2  Smokeless Tobacco Never Used   Counseling given: Not Answered   Outpatient Medications Prior to Visit  Medication Sig Dispense Refill  . amLODipine (NORVASC) 10 MG tablet Take 1 tablet (10 mg total) by mouth daily. 90 tablet 3  . aspirin 81 MG tablet Take 81 mg by mouth every morning.     Marland Kitchen atorvastatin (LIPITOR) 40 MG tablet Take 1 tablet (40 mg total) by mouth daily. 90 tablet 3  . diphenhydrAMINE HCl, Sleep, 50 MG CAPS Take 50 mg by mouth at bedtime.    Marland Kitchen losartan (COZAAR) 25 MG tablet Take 1 tablet (25 mg total) by mouth daily. 90 tablet 3  . Magnesium ([employer]S TRIPLE MAGNESIUM COMPLEX) 400 MG CAPS Take 400 mg by mouth daily.    . Naphazoline-Glycerin (CLEAR EYES REDNESS RELIEF OP) Place 1 drop into both eyes daily.    Marland Kitchen OVER THE COUNTER MEDICATION Apply 1 application topically daily as needed (pain). Hemp cream     No facility-administered medications prior to visit.     Review of Systems:   Constitutional:   No  weight loss, night sweats,  Fevers, chills, + fatigue, or  lassitude.  HEENT:   No headaches,  Difficulty swallowing,  Tooth/dental problems, or  Sore throat,                No sneezing, itching, ear ache, nasal congestion, post nasal drip,   CV:  No chest pain,  Orthopnea, PND, swelling in lower extremities, anasarca, dizziness, palpitations, syncope.   GI  No heartburn, indigestion, abdominal pain, nausea, vomiting, diarrhea, change in bowel habits, loss of appetite, bloody stools.   Resp:    No chest wall deformity  Skin: no rash or lesions.  GU: no dysuria, change in color of urine, no urgency or frequency.  No flank pain, no hematuria   MS:  No joint pain or swelling.  No decreased range of motion.  No back pain.    Physical Exam  BP 136/72 (BP Location: Left Arm, Patient Position: Sitting, Cuff Size:  Normal)   Pulse 91   Temp 98.3 F (36.8 C) (Skin)   Ht 5' 11.5" (1.816 m)   Wt 217 lb (98.4 kg)   SpO2 95%   BMI 29.84 kg/m   GEN: A/Ox3; pleasant , NAD, elderly   HEENT:  [place]iv[place]/AT,   NOSE-clear, THROAT-clear, no lesions, no postnasal drip or exudate noted.   NECK:  Supple w/ fair ROM; no JVD; normal carotid impulses w/o bruits; no thyromegaly or nodules palpated; no lymphadenopathy.    RESP  Clear  P & A; w/o, wheezes/ rales/ or rhonchi. no accessory muscle use, no dullness to percussion  CARD:  RRR, no m/r/g, tr  peripheral edema, pulses intact, no cyanosis or clubbing.  GI:   Soft & nt; nml bowel sounds; no organomegaly or masses detected.   Musco: Warm bil, no deformities or joint swelling noted.   Neuro: alert, no focal deficits noted.    Skin: Warm, no lesions or rashes    Lab Results:  CBC    Component Value Date/Time   WBC 8.4 03/29/2020 1200   RBC 4.67 03/29/2020 1200   HGB 13.5 03/29/2020 1200   HGB 11.5 (L) 03/18/2020 1430   HGB 12.9 (L) 02/24/2020 1542   HCT 41.8 03/29/2020 1200   HCT 39.6 02/24/2020 1542   PLT 214 03/29/2020 1200  PLT 139 (L) 03/18/2020 1430   PLT 181 02/24/2020 1542   MCV 89.5 03/29/2020 1200   MCV 87 02/24/2020 1542   MCH 28.9 03/29/2020 1200   MCHC 32.3 03/29/2020 1200   RDW 16.4 (H) 03/29/2020 1200   RDW 15.6 (H) 02/24/2020 1542   LYMPHSABS 5.6 (H) 03/29/2020 1200   LYMPHSABS 5.3 (H) 02/24/2020 1542   MONOABS 0.4 03/29/2020 1200   EOSABS 0.1 03/29/2020 1200   EOSABS 0.1 02/24/2020 1542   BASOSABS 0.0 03/29/2020 1200   BASOSABS 0.0 02/24/2020 1542    BMET    Component Value Date/Time   NA 138 03/18/2020 1430   NA 139 02/05/2020 1503   K 4.2 03/18/2020 1430   CL 103 03/18/2020 1430   CO2 28 03/18/2020 1430   GLUCOSE 121 (H) 03/18/2020 1430   BUN 16 03/18/2020 1430   BUN 11 02/05/2020 1503   CREATININE 1.08 03/18/2020 1430   CALCIUM 9.1 03/18/2020 1430   GFRNONAA >60 03/18/2020 1430   GFRAA 97 02/05/2020 1503     BNP No results found for: BNP  ProBNP No results found for: PROBNP  Imaging: [place] CORE BIOPSY (LYMPH NODES)  Result Date: 03/29/2020 INDICATION: Hypermetabolic left inguinal lymph node EXAM: ULTRASOUND GUIDED CORE BIOPSY OF FDG AVID LEFT INGUINAL LEFT MEDICATIONS: None. ANESTHESIA/SEDATION: Fentanyl 50 mcg IV; Versed 1 mg IV Moderate Sedation Time:  10 minutes The patient was continuously monitored during the procedure by the interventional radiology nurse under my direct supervision. PROCEDURE: The procedure, risks, benefits, and alternatives were explained to the patient. Questions regarding the procedure were encouraged and answered. The patient understands and consents to the procedure. Patient position supine on the ultrasound table. The left inguinal skin was prepped with chlorhexidine in a sterile fashion, and a sterile drape was applied covering the operative field. A sterile gown and sterile gloves were used for the procedure. Local anesthesia was provided with 1% Lidocaine. Following local lidocaine administration, four 16 gauge cores were obtained from the FDG avid left inguinal lymph node utilizing continuous ultrasound guidance. Samples were sent to pathology in sterile saline. Needle removed and hemostasis achieved with 2 minutes of manual compression. Post procedure ultrasound images showed no evidence of significant hemorrhage. COMPLICATIONS: None immediate. FINDINGS: Ultrasound-guided biopsy of enlarged left inguinal lymph node. COMPLICATIONS: None immediate. Electronically Signed   By: Miachel Roux M.D.   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  Pulmonary function testing today shows no significant obstruction.  There is some mild to moderate restriction.  Patient does have some shortness of breath with activities but remains pretty active for his age.  Continues to work full-time.  Has no significant cough or wheezing. A trial of Spiriva to see if this helps with his shortness of breath.  Plan  Patient Instructions  Activity as tolerated  Follow up with Oncology Dr Julien Nordmann as planned with scans later this year  Begin Spiriva 2 puffs daily , rinse after use.  Refer to Urology for abnormal prostate results on PET scan .  Follow up with Dr. Valeta Harms in 3 months and As needed   Please contact office for sooner follow up if symptoms do not improve or worsen or seek emergency care       Abnormal CT of the chest Abnormal CT chest with diffuse lymphadenopathy.  Recent work-up revealed after inguinal biopsy small lymphocytic lymphoma.  Patient is continue follow-up with oncology.  Incidental findings on PET scan showed abnormal activity in the prostate gland.  A referral to urology has been placed.     Rexene Edison, NP 04/20/2020

## 2020-04-22 DIAGNOSIS — Z7982 Long term (current) use of aspirin: Secondary | ICD-10-CM | POA: Diagnosis not present

## 2020-04-22 DIAGNOSIS — Z7722 Contact with and (suspected) exposure to environmental tobacco smoke (acute) (chronic): Secondary | ICD-10-CM | POA: Diagnosis not present

## 2020-04-22 DIAGNOSIS — E663 Overweight: Secondary | ICD-10-CM | POA: Diagnosis not present

## 2020-04-22 DIAGNOSIS — Z791 Long term (current) use of non-steroidal anti-inflammatories (NSAID): Secondary | ICD-10-CM | POA: Diagnosis not present

## 2020-04-22 DIAGNOSIS — I10 Essential (primary) hypertension: Secondary | ICD-10-CM | POA: Diagnosis not present

## 2020-04-22 DIAGNOSIS — Z6829 Body mass index (BMI) 29.0-29.9, adult: Secondary | ICD-10-CM | POA: Diagnosis not present

## 2020-04-22 DIAGNOSIS — M1990 Unspecified osteoarthritis, unspecified site: Secondary | ICD-10-CM | POA: Diagnosis not present

## 2020-04-22 DIAGNOSIS — I951 Orthostatic hypotension: Secondary | ICD-10-CM | POA: Diagnosis not present

## 2020-04-22 DIAGNOSIS — E785 Hyperlipidemia, unspecified: Secondary | ICD-10-CM | POA: Diagnosis not present

## 2020-04-22 DIAGNOSIS — G8929 Other chronic pain: Secondary | ICD-10-CM | POA: Diagnosis not present

## 2020-05-25 ENCOUNTER — Other Ambulatory Visit: Payer: Self-pay | Admitting: Adult Health

## 2020-05-25 NOTE — Telephone Encounter (Signed)
Note on the refill request that was sent in stated that the incruse is not covered by the pts insurance.  What would you like to change this to.  Looks like the incruse was changed from the spiriva resp.   Thanks

## 2020-05-28 ENCOUNTER — Telehealth: Payer: Self-pay | Admitting: Adult Health

## 2020-05-28 MED ORDER — INCRUSE ELLIPTA 62.5 MCG/INH IN AEPB: 1.0000 | INHALATION_SPRAY | Freq: Every day | RESPIRATORY_TRACT | 5 refills | Status: AC

## 2020-05-28 NOTE — Telephone Encounter (Signed)
Spoke with pharmacist Mitzi Hansen, states that Spiriva Respimat is not covered by insurance. Preferred alternative is Incruse.   Dr. Valeta Harms ok to switch to covered alternative? Thanks!

## 2020-05-28 NOTE — Telephone Encounter (Signed)
New rx sent to pharmacy.  atc pt to make aware of rx change, but no answer and VM full. Wcb.

## 2020-05-28 NOTE — Telephone Encounter (Signed)
Ok to switch to incruse  Garner Nash, DO [facility] 05/28/2020 11:00 AM

## 2020-06-01 DIAGNOSIS — N5201 Erectile dysfunction due to arterial insufficiency: Secondary | ICD-10-CM | POA: Diagnosis not present

## 2020-06-21 DIAGNOSIS — M25512 Pain in left shoulder: Secondary | ICD-10-CM | POA: Diagnosis not present

## 2020-10-08 ENCOUNTER — Inpatient Hospital Stay: Payer: Medicare HMO | Attending: Internal Medicine

## 2020-10-08 ENCOUNTER — Other Ambulatory Visit: Payer: Self-pay

## 2020-10-08 ENCOUNTER — Ambulatory Visit (HOSPITAL_COMMUNITY)
Admission: RE | Admit: 2020-10-08 | Discharge: 2020-10-08 | Disposition: A | Discharge status: Home / Self Care | Payer: Medicare HMO | Source: Ambulatory Visit | Attending: Internal Medicine | Admitting: Internal Medicine

## 2020-10-08 DIAGNOSIS — Z79899 Other long term (current) drug therapy: Secondary | ICD-10-CM | POA: Insufficient documentation

## 2020-10-08 DIAGNOSIS — C8301 Small cell B-cell lymphoma, lymph nodes of head, face, and neck: Secondary | ICD-10-CM | POA: Insufficient documentation

## 2020-10-08 DIAGNOSIS — C8300 Small cell B-cell lymphoma, unspecified site: Secondary | ICD-10-CM

## 2020-10-08 LAB — CBC WITH DIFFERENTIAL (CANCER CENTER ONLY)
Abs Immature Granulocytes: 0.01 10*3/uL (ref 0.00–0.07)
Basophils Absolute: 0 10*3/uL (ref 0.0–0.1)
Basophils Relative: 0 %
Eosinophils Absolute: 0 10*3/uL (ref 0.0–0.5)
Eosinophils Relative: 0 %
HCT: 34.7 % — ABNORMAL LOW (ref 39.0–52.0)
Hemoglobin: 11.4 g/dL — ABNORMAL LOW (ref 13.0–17.0)
Immature Granulocytes: 0 %
Lymphocytes Relative: 70 %
Lymphs Abs: 5.1 10*3/uL — ABNORMAL HIGH (ref 0.7–4.0)
MCH: 29.6 pg (ref 26.0–34.0)
MCHC: 32.9 g/dL (ref 30.0–36.0)
MCV: 90.1 fL (ref 80.0–100.0)
Monocytes Absolute: 0.3 10*3/uL (ref 0.1–1.0)
Monocytes Relative: 5 %
Neutro Abs: 1.8 10*3/uL (ref 1.7–7.7)
Neutrophils Relative %: 25 %
Platelet Count: 138 10*3/uL — ABNORMAL LOW (ref 150–400)
RBC: 3.85 MIL/uL — ABNORMAL LOW (ref 4.22–5.81)
RDW: 16.6 % — ABNORMAL HIGH (ref 11.5–15.5)
WBC Count: 7.3 10*3/uL (ref 4.0–10.5)
nRBC: 0 % (ref 0.0–0.2)

## 2020-10-08 LAB — CMP (CANCER CENTER ONLY)
ALT: 16 U/L (ref 0–44)
AST: 16 U/L (ref 15–41)
Albumin: 4.2 g/dL (ref 3.5–5.0)
Alkaline Phosphatase: 50 U/L (ref 38–126)
Anion gap: 9 (ref 5–15)
BUN: 16 mg/dL (ref 8–23)
CO2: 27 mmol/L (ref 22–32)
Calcium: 9.5 mg/dL (ref 8.9–10.3)
Chloride: 104 mmol/L (ref 98–111)
Creatinine: 1.23 mg/dL (ref 0.61–1.24)
GFR, Estimated: >60 mL/min (ref >60)
Glucose, Bld: 101 mg/dL — ABNORMAL HIGH (ref 70–99)
Potassium: 4.6 mmol/L (ref 3.5–5.1)
Sodium: 140 mmol/L (ref 135–145)
Total Bilirubin: 0.3 mg/dL (ref 0.3–1.2)
Total Protein: 7.9 g/dL (ref 6.5–8.1)

## 2020-10-08 LAB — LACTATE DEHYDROGENASE: LDH: 237 U/L — ABNORMAL HIGH (ref 98–192)

## 2020-10-11 ENCOUNTER — Inpatient Hospital Stay: Payer: Medicare HMO | Admitting: Internal Medicine

## 2020-10-18 ENCOUNTER — Ambulatory Visit (HOSPITAL_COMMUNITY)
Admission: RE | Admit: 2020-10-18 | Discharge: 2020-10-18 | Disposition: A | Discharge status: Home / Self Care | Payer: Medicare HMO | Source: Ambulatory Visit | Attending: Internal Medicine | Admitting: Internal Medicine

## 2020-10-18 ENCOUNTER — Other Ambulatory Visit: Payer: Self-pay

## 2020-10-18 DIAGNOSIS — C8300 Small cell B-cell lymphoma, unspecified site: Secondary | ICD-10-CM | POA: Diagnosis not present

## 2020-10-18 DIAGNOSIS — N3289 Other specified disorders of bladder: Secondary | ICD-10-CM | POA: Diagnosis not present

## 2020-10-18 DIAGNOSIS — K8020 Calculus of gallbladder without cholecystitis without obstruction: Secondary | ICD-10-CM | POA: Diagnosis not present

## 2020-10-18 DIAGNOSIS — I700 Atherosclerosis of aorta: Secondary | ICD-10-CM | POA: Diagnosis not present

## 2020-10-18 DIAGNOSIS — C8590 Non-Hodgkin lymphoma, unspecified, unspecified site: Secondary | ICD-10-CM | POA: Diagnosis not present

## 2020-10-18 MED ORDER — IOHEXOL 350 MG/ML SOLN
100.0000 mL | Freq: Once | INTRAVENOUS | Status: AC | PRN
  Administered 2020-10-18: 80 mL via INTRAVENOUS

## 2020-10-21 NOTE — Progress Notes (Signed)
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  PRIOR THERAPY: None  CURRENT THERAPY: Observation  INTERVAL HISTORY: Joseph Moyer 80 y.o. male returns to the clinic today for a follow-up visit.  The patient was last seen in the clinic on 04/07/2020.  At that time, the patient had an ultrasound-guided core biopsy of a hypermetabolic enlarged left inguinal lymph node by interventional radiology and the final pathology was consistent with small lymphocytic lymphoma.  The patient is on observation and feeling well. He denies any recent fever, chills, night sweats, or unexplained weight loss.  He denies any abdominal pain or bloating. He denies any noticeable adenopathy.  He denies any abnormal bleeding or bruising. He denies shortness of breath, lightheadedness, or pallor.  The patient denies any recent or frequent infections.  The patient recently had a restaging CT scan performed.  He is here today for evaluation to review his scan results.    MEDICAL HISTORY: Past Medical History:  Diagnosis Date   Allergy    Arthritis    GERD (gastroesophageal reflux disease)    Hyperlipidemia    Hypertension     ALLERGIES:  is allergic to oxycodone, ramipril, and rosuvastatin.  MEDICATIONS:  Current Outpatient Medications  Medication Sig Dispense Refill   aspirin 81 MG tablet Take 81 mg by mouth every morning.      atorvastatin (LIPITOR) 40 MG tablet Take 1 tablet (40 mg total) by mouth daily. 90 tablet 3   diphenhydrAMINE HCl, Sleep, 50 MG CAPS Take 50 mg by mouth at bedtime.     losartan (COZAAR) 25 MG tablet Take 1 tablet (25 mg total) by mouth daily. 90 tablet 3   Magnesium ([employer] TRIPLE MAGNESIUM COMPLEX) 400 MG CAPS Take 400 mg by mouth daily.      Naphazoline-Glycerin (CLEAR EYES REDNESS RELIEF OP) Place 1 drop into both eyes daily.     OVER THE COUNTER MEDICATION Apply 1 application topically daily as needed (pain). Hemp cream     umeclidinium bromide (INCRUSE ELLIPTA) 62.5 MCG/INH AEPB Inhale 1 puff into the lungs daily. 60 each 5   amLODipine (NORVASC) 10 MG tablet Take 1 tablet (10 mg total) by mouth daily. 90 tablet 3   No current facility-administered medications for this visit.    SURGICAL HISTORY:  Past Surgical History:  Procedure Laterality Date   BRONCHIAL NEEDLE ASPIRATION BIOPSY  03/12/2020   Procedure: BRONCHIAL NEEDLE ASPIRATION BIOPSIES;  Surgeon: Garner Nash, DO;  Location: [facility];  Service: Pulmonary;;   CIRCUMCISION     COLONOSCOPY WITH PROPOFOL N/A 05/10/2015   Procedure: COLONOSCOPY WITH PROPOFOL;  Surgeon: Garlan Fair, MD;  Location: [facility];  Service: Endoscopy;  Laterality: N/A;   DECOMPRESSIVE LUMBAR LAMINECTOMY LEVEL 1 N/A 06/15/2018   Procedure: Gordy Levan decompression L1-L2/ complete inferior facetectomyof L1. Posterior lateral arthrodesiswith autograft bone L1-L2.;  Surgeon: Melina Schools, MD;  Location: [facility];  Service: Orthopedics;  Laterality: N/A;   HEMORRHOID SURGERY     VIDEO BRONCHOSCOPY WITH ENDOBRONCHIAL ULTRASOUND N/A 03/12/2020   Procedure: VIDEO BRONCHOSCOPY WITH ENDOBRONCHIAL ULTRASOUND;  Surgeon: Garner Nash, DO;  Location: [facility];  Service: Pulmonary;  Laterality: N/A;    REVIEW OF SYSTEMS:   Review of Systems  Constitutional: Negative for appetite change, chills, fatigue, fever and unexpected weight  change.  HENT:   Negative for mouth sores, nosebleeds, sore throat and trouble swallowing.   Eyes: Negative for eye problems and icterus.  Respiratory: Negative for cough, hemoptysis, shortness of breath and wheezing.   Cardiovascular: Negative for chest pain and leg swelling.  Gastrointestinal: Negative for abdominal pain, constipation, diarrhea, nausea and  vomiting.  Genitourinary: Negative for bladder incontinence, difficulty urinating, dysuria, frequency and hematuria.   Musculoskeletal: Negative for back pain, gait problem, neck pain and neck stiffness.  Skin: Negative for itching and rash.  Neurological: Negative for dizziness, extremity weakness, gait problem, headaches, light-headedness and seizures.  Hematological: Negative for adenopathy. Does not bruise/bleed easily.  Psychiatric/Behavioral: Negative for confusion, depression and sleep disturbance. The patient is not nervous/anxious.     PHYSICAL EXAMINATION:  Blood pressure 125/61, pulse 87, temperature (!) 97.1 F (36.2 C), temperature source Tympanic, resp. rate 18, weight 208 lb 7 oz (94.5 kg), SpO2 96 %.  ECOG PERFORMANCE STATUS:  0  Physical Exam  Constitutional: Oriented to person, place, and time and well-developed, well-nourished, and in no distress.  HENT:  Head: Normocephalic and atraumatic.  Mouth/Throat: Oropharynx is clear and moist. No oropharyngeal exudate.  Eyes: Conjunctivae are normal. Right eye exhibits no discharge. Left eye exhibits no discharge. No scleral icterus.  Neck: Normal range of motion. Neck supple.  Cardiovascular: Normal rate, regular rhythm, normal heart sounds and intact distal pulses.   Pulmonary/Chest: Effort normal and breath sounds normal. No respiratory distress. No wheezes. No rales.  Abdominal: Soft. Bowel sounds are normal. Exhibits no distension and no mass. There is no tenderness.  Musculoskeletal: Normal range of motion. Exhibits no edema.  Lymphadenopathy:    No cervical adenopathy.  Neurological: Alert and oriented to person, place, and time. Exhibits normal muscle tone. Gait normal. Coordination normal.  Skin: Skin is warm and dry. No rash noted. Not diaphoretic. No erythema. No pallor.  Psychiatric: Mood, memory and judgment normal.  Vitals reviewed.  LABORATORY DATA: Lab Results  Component Value Date   WBC 7.3 10/08/2020    HGB 11.4 (L) 10/08/2020   HCT 34.7 (L) 10/08/2020   MCV 90.1 10/08/2020   PLT 138 (L) 10/08/2020      Chemistry      Component Value Date/Time   NA 140 10/08/2020 1518   NA 139 02/05/2020 1503   K 4.6 10/08/2020 1518   CL 104 10/08/2020 1518   CO2 27 10/08/2020 1518   BUN 16 10/08/2020 1518   BUN 11 02/05/2020 1503   CREATININE 1.23 10/08/2020 1518      Component Value Date/Time   CALCIUM 9.5 10/08/2020 1518   ALKPHOS 50 10/08/2020 1518   AST 16 10/08/2020 1518   ALT 16 10/08/2020 1518   BILITOT 0.3 10/08/2020 1518       RADIOGRAPHIC STUDIES:  CT Chest W Contrast  Result Date: 10/19/2020 CLINICAL DATA:  Lymphoma staging EXAM: CT CHEST, ABDOMEN, AND PELVIS WITH CONTRAST TECHNIQUE: Multidetector CT imaging of the chest, abdomen and pelvis was performed following the standard protocol during bolus administration of intravenous contrast. CONTRAST:  34mL OMNIPAQUE IOHEXOL 350 MG/ML SOLN COMPARISON:  PET-CT dated March 08, 2020 FINDINGS: CT CHEST FINDINGS Cardiovascular: Normal heart size. No pericardial effusion. Three-vessel coronary artery calcifications atherosclerotic disease of the thoracic aorta. Mediastinum/Nodes: Increased size of subcarinal lymph node, measuring up to 1.7 cm on image 31, previously 1.3 cm. Other previously seen lymph nodes are unchanged in size compared to prior exam. Reference AP window lymph node measuring 1.6 cm in  short axis. Mildly enlarged bilateral axillary lymph nodes are unchanged compared to prior reference right axillary lymph node measuring 1.1 cm in short axis on series 2, image 11. Lungs/Pleura: Central airways are patent. No consolidation, pleural effusion or pneumothorax. No suspicious pulmonary nodules. Musculoskeletal: No chest wall mass or suspicious bone lesions identified. CT ABDOMEN PELVIS FINDINGS Hepatobiliary: No suspicious liver lesions. Cholelithiasis. No gallbladder wall thickening. No biliary ductal dilation. Pancreas:  Unremarkable. No pancreatic ductal dilatation or surrounding inflammatory changes. Spleen: Normal in size without focal abnormality. Adrenals/Urinary Tract: Adrenal glands are unremarkable. Kidneys are normal, without renal calculi, focal lesion, or hydronephrosis. Mild bladder wall thickening. Stomach/Bowel: Stomach is within normal limits. Appendix appears normal. Diverticula of the sigmoid colon. No evidence of bowel wall thickening, distention, or inflammatory changes. Vascular/Lymphatic: Atherosclerotic disease of the abdominal aorta. Enlarged periportal lymph nodes are unchanged compared to prior exam. Reference node adjacent the pancreatic head measuring 1.4 cm in short axis on series 2, image 62.Retroperitoneal and pelvic lymph nodes are slightly increased in size compared to prior exam. Reference left periaortic lymph node measures 1.6 cm in short axis on series 2 image 81, previously 1.4 cm. Reference left common iliac lymph node measuring 1.0 cm in short axis on image 89, previously 0.8 cm. Reference lymph node of the left pelvic sidewall measuring 1.0 cm in short axis on series 2, image 102, previously 0.8 cm. Reproductive: Prostatomegaly. Other: No abdominal wall hernia or abnormality. No abdominopelvic ascites. Musculoskeletal: Prior posterior fusion of the upper lumbar spine. No aggressive appearing osseous lesions. IMPRESSION: Slight increased size of subcarinal, retroperitoneal and pelvic lymph nodes when compared with prior PET-CT. Aortic Atherosclerosis (ICD10-I70.0). Electronically Signed   By: Yetta Glassman M.D.   On: 10/19/2020 13:12   CT Abdomen Pelvis W Contrast  Result Date: 10/19/2020 CLINICAL DATA:  Lymphoma staging EXAM: CT CHEST, ABDOMEN, AND PELVIS WITH CONTRAST TECHNIQUE: Multidetector CT imaging of the chest, abdomen and pelvis was performed following the standard protocol during bolus administration of intravenous contrast. CONTRAST:  45mL OMNIPAQUE IOHEXOL 350 MG/ML SOLN  COMPARISON:  PET-CT dated March 08, 2020 FINDINGS: CT CHEST FINDINGS Cardiovascular: Normal heart size. No pericardial effusion. Three-vessel coronary artery calcifications atherosclerotic disease of the thoracic aorta. Mediastinum/Nodes: Increased size of subcarinal lymph node, measuring up to 1.7 cm on image 31, previously 1.3 cm. Other previously seen lymph nodes are unchanged in size compared to prior exam. Reference AP window lymph node measuring 1.6 cm in short axis. Mildly enlarged bilateral axillary lymph nodes are unchanged compared to prior reference right axillary lymph node measuring 1.1 cm in short axis on series 2, image 11. Lungs/Pleura: Central airways are patent. No consolidation, pleural effusion or pneumothorax. No suspicious pulmonary nodules. Musculoskeletal: No chest wall mass or suspicious bone lesions identified. CT ABDOMEN PELVIS FINDINGS Hepatobiliary: No suspicious liver lesions. Cholelithiasis. No gallbladder wall thickening. No biliary ductal dilation. Pancreas: Unremarkable. No pancreatic ductal dilatation or surrounding inflammatory changes. Spleen: Normal in size without focal abnormality. Adrenals/Urinary Tract: Adrenal glands are unremarkable. Kidneys are normal, without renal calculi, focal lesion, or hydronephrosis. Mild bladder wall thickening. Stomach/Bowel: Stomach is within normal limits. Appendix appears normal. Diverticula of the sigmoid colon. No evidence of bowel wall thickening, distention, or inflammatory changes. Vascular/Lymphatic: Atherosclerotic disease of the abdominal aorta. Enlarged periportal lymph nodes are unchanged compared to prior exam. Reference node adjacent the pancreatic head measuring 1.4 cm in short axis on series 2, image 62.Retroperitoneal and pelvic lymph nodes are slightly increased in size compared to  prior exam. Reference left periaortic lymph node measures 1.6 cm in short axis on series 2 image 81, previously 1.4 cm. Reference left common  iliac lymph node measuring 1.0 cm in short axis on image 89, previously 0.8 cm. Reference lymph node of the left pelvic sidewall measuring 1.0 cm in short axis on series 2, image 102, previously 0.8 cm. Reproductive: Prostatomegaly. Other: No abdominal wall hernia or abnormality. No abdominopelvic ascites. Musculoskeletal: Prior posterior fusion of the upper lumbar spine. No aggressive appearing osseous lesions. IMPRESSION: Slight increased size of subcarinal, retroperitoneal and pelvic lymph nodes when compared with prior PET-CT. Aortic Atherosclerosis (ICD10-I70.0). Electronically Signed   By: Yetta Glassman M.D.   On: 10/19/2020 13:12     ASSESSMENT/PLAN:  This is a very pleasant 80 year old African-American male diagnosed with small lymphocytic lymphoma with generalized adenopathy in his neck, chest, axilla, abdomen, and pelvis.  She was diagnosed in February 2022   The patient recently had a restaging CT scan of the chest, abdomen, and pelvis.  The patient was seen with Dr. Julien Nordmann today.  Labs were reviewed.  Dr. Julien Nordmann personally and independently reviewed the patient scan discussed results with the patient today.  The scan showed stable/slightly/minimally increased adenopathy. His CBC shows a normal total WBC and stable lymphocyte count at 5.1.   Dr. Julien Nordmann recommends that he continue on observation with a repeat CT chest, abdomen, and pelvis in 6 months.   We will see the patient back for follow-up visit in 6 months for repeat blood work and to review his scan.   The patient was advised to call immediately if he has any concerning symptoms in the interval. The patient voices understanding of current disease status and treatment options and is in agreement with the current care plan. All questions were answered. The patient knows to call the clinic with any problems, questions or concerns. We can certainly see the patient much sooner if necessary      Orders Placed This Encounter   Procedures   CT Abdomen Pelvis W Contrast    Standing Status:   Future    Standing Expiration Date:   10/25/2021    Order Specific Question:   If indicated for the ordered procedure, I authorize the administration of contrast media per Radiology protocol    Answer:   Yes    Order Specific Question:   Preferred imaging location?    Answer:   [facility]    Order Specific Question:   Is Oral Contrast requested for this exam?    Answer:   Yes, Per Radiology protocol   CT Chest W Contrast    Standing Status:   Future    Standing Expiration Date:   10/25/2021    Order Specific Question:   If indicated for the ordered procedure, I authorize the administration of contrast media per Radiology protocol    Answer:   Yes    Order Specific Question:   Preferred imaging location?    Answer:   [facility]   CBC with Differential ([place] Only)    Standing Status:   Future    Standing Expiration Date:   10/25/2021   CMP ([place] [facility] only)    Standing Status:   Future    Standing Expiration Date:   10/25/2021   Lactate dehydrogenase    Standing Status:   Future    Standing Expiration Date:   10/25/2021      Joseph Sos Rachana Malesky, PA-C 10/25/20  ADDENDUM: Hematology/Oncology  Attending: I had a face-to-face encounter with the patient today.  I reviewed his record, lab, scan and recommended his care plan.  This is a very pleasant 80 years old African-American male diagnosed with small lymphocytic lymphoma with generalized lymphadenopathy in the neck, chest, abdomen pelvis in February 2022.  The patient is currently asymptomatic.  He has been on observation with no active treatment. He had repeat CT scan of the chest, abdomen pelvis performed recently.  I personally and independently reviewed the scans and discussed the results with the patient today. His scan showed no concerning findings for disease progression and he has stable lymphadenopathy. I recommended for the  patient to continue on observation with repeat CT scan of the chest, abdomen and pelvis in 6 months. He was advised to call immediately if he has any other concerning symptoms in the interval.  Disclaimer: This note was dictated with voice recognition software. Similar sounding words can inadvertently be transcribed and may be missed upon review. Eilleen Kempf, MD 10/25/20

## 2020-10-25 ENCOUNTER — Inpatient Hospital Stay (HOSPITAL_BASED_OUTPATIENT_CLINIC_OR_DEPARTMENT_OTHER): Payer: Medicare HMO | Admitting: Physician Assistant

## 2020-10-25 ENCOUNTER — Other Ambulatory Visit: Payer: Self-pay

## 2020-10-25 ENCOUNTER — Encounter: Payer: Self-pay | Admitting: Physician Assistant

## 2020-10-25 VITALS — BP 125/61 | HR 87 | Temp 97.1°F | Resp 18 | Wt 208.4 lb

## 2020-10-25 DIAGNOSIS — R590 Localized enlarged lymph nodes: Secondary | ICD-10-CM

## 2020-10-25 DIAGNOSIS — C8300 Small cell B-cell lymphoma, unspecified site: Secondary | ICD-10-CM | POA: Diagnosis not present

## 2020-10-25 DIAGNOSIS — Z79899 Other long term (current) drug therapy: Secondary | ICD-10-CM | POA: Diagnosis not present

## 2020-10-25 DIAGNOSIS — C8301 Small cell B-cell lymphoma, lymph nodes of head, face, and neck: Secondary | ICD-10-CM | POA: Diagnosis not present

## 2020-11-05 ENCOUNTER — Telehealth: Payer: Self-pay | Admitting: Physician Assistant

## 2020-11-05 NOTE — Telephone Encounter (Signed)
Sch per 9/23 los, left msg

## 2021-03-06 DIAGNOSIS — R051 Acute cough: Secondary | ICD-10-CM | POA: Diagnosis not present

## 2021-03-06 DIAGNOSIS — J069 Acute upper respiratory infection, unspecified: Secondary | ICD-10-CM | POA: Diagnosis not present

## 2021-03-06 DIAGNOSIS — R351 Nocturia: Secondary | ICD-10-CM | POA: Diagnosis not present

## 2021-03-22 ENCOUNTER — Other Ambulatory Visit: Payer: Self-pay | Admitting: *Deleted

## 2021-03-22 MED ORDER — ATORVASTATIN CALCIUM 40 MG PO TABS: 40.0000 mg | ORAL_TABLET | Freq: Every day | ORAL | 0 refills | Status: DC

## 2021-04-05 DIAGNOSIS — M79606 Pain in leg, unspecified: Secondary | ICD-10-CM | POA: Diagnosis not present

## 2021-04-05 DIAGNOSIS — M25512 Pain in left shoulder: Secondary | ICD-10-CM | POA: Diagnosis not present

## 2021-04-14 ENCOUNTER — Other Ambulatory Visit: Payer: Self-pay

## 2021-04-14 MED ORDER — ATORVASTATIN CALCIUM 40 MG PO TABS: 40.0000 mg | ORAL_TABLET | Freq: Every day | ORAL | 0 refills | Status: DC

## 2021-04-15 ENCOUNTER — Other Ambulatory Visit: Payer: Self-pay

## 2021-04-15 MED ORDER — AMLODIPINE BESYLATE 10 MG PO TABS: 10.0000 mg | ORAL_TABLET | Freq: Every day | ORAL | 0 refills | Status: DC

## 2021-04-15 MED ORDER — LOSARTAN POTASSIUM 25 MG PO TABS: 25.0000 mg | ORAL_TABLET | Freq: Every day | ORAL | 0 refills | Status: DC

## 2021-04-26 ENCOUNTER — Inpatient Hospital Stay: Payer: Medicare HMO | Attending: Internal Medicine

## 2021-04-26 ENCOUNTER — Telehealth: Payer: Self-pay | Admitting: Physician Assistant

## 2021-04-26 NOTE — Telephone Encounter (Signed)
I am scheduled to see the patient on Thursday. He was supposed to have a CT scan prior to this appointment which has not been scheduled. I called the patient to give instructions on how to call the imaging department to schedule his CT scan. I was unable to reach the patient and spoke to his wife and gave her the number to the imaging department to give to her husband. She states her and her husband were very confused why he was being seen here and was not aware of his appointments and seemed frustrated. I encouraged her to speak with him and recommended he call us back once he returns home from work for clarification.  ?

## 2021-04-26 NOTE — Progress Notes (Deleted)
[facility] ?OFFICE PROGRESS NOTE ? ?[name], MD ?[address] ?[place] [address] ? ?DIAGNOSIS: *** ? ?PRIOR THERAPY: ? ?CURRENT THERAPY: ? ?INTERVAL HISTORY: ?KRUE PETERKA 81 y.o. male returns for *** regular *** visit for followup of ***  ? ?MEDICAL HISTORY: ?Past Medical History:  ?Diagnosis Date  ? Allergy   ? Arthritis   ? GERD (gastroesophageal reflux disease)   ? Hyperlipidemia   ? Hypertension   ? ? ?ALLERGIES:  is allergic to oxycodone, ramipril, and rosuvastatin. ? ?MEDICATIONS:  ?Current Outpatient Medications  ?Medication Sig Dispense Refill  ? amLODipine (NORVASC) 10 MG tablet Take 1 tablet (10 mg total) by mouth daily. Please make overdue appt with Dr. Johney Frame before anymore refills. Thank you 1st attempt 30 tablet 0  ? aspirin 81 MG tablet Take 81 mg by mouth every morning.     ? atorvastatin (LIPITOR) 40 MG tablet Take 1 tablet (40 mg total) by mouth daily. Please make overdue appt with Dr. Johney Frame before anymore refills. Thank you 2nd attempt 15 tablet 0  ? diphenhydrAMINE HCl, Sleep, 50 MG CAPS Take 50 mg by mouth at bedtime.    ? losartan (COZAAR) 25 MG tablet Take 1 tablet (25 mg total) by mouth daily. Please make overdue appt with Dr. Johney Frame before anymore refills. Thank you 1st attempt 30 tablet 0  ? Magnesium ([employer] TRIPLE MAGNESIUM COMPLEX) 400 MG CAPS Take 400 mg by mouth daily.    ? Naphazoline-Glycerin (CLEAR EYES REDNESS RELIEF OP) Place 1 drop into both eyes daily.    ? OVER THE COUNTER MEDICATION Apply 1 application topically daily as needed (pain). Hemp cream    ? umeclidinium bromide (INCRUSE ELLIPTA) 62.5 MCG/INH AEPB Inhale 1 puff into the lungs daily. 60 each 5  ? ?No current facility-administered medications for this visit.  ? ? ?SURGICAL HISTORY:  ?Past Surgical History:  ?Procedure Laterality Date  ? BRONCHIAL NEEDLE ASPIRATION BIOPSY  03/12/2020  ? Procedure: BRONCHIAL NEEDLE ASPIRATION BIOPSIES;  Surgeon: Garner Nash, DO;   Location: [facility];  Service: Pulmonary;;  ? CIRCUMCISION    ? COLONOSCOPY WITH PROPOFOL N/A 05/10/2015  ? Procedure: COLONOSCOPY WITH PROPOFOL;  Surgeon: Garlan Fair, MD;  Location: [facility];  Service: Endoscopy;  Laterality: N/A;  ? DECOMPRESSIVE LUMBAR LAMINECTOMY LEVEL 1 N/A 06/15/2018  ? Procedure: Gill decompression L1-L2/ complete inferior facetectomyof L1. Posterior lateral arthrodesiswith autograft bone L1-L2.;  Surgeon: Melina Schools, MD;  Location: [facility];  Service: Orthopedics;  Laterality: N/A;  ? HEMORRHOID SURGERY    ? VIDEO BRONCHOSCOPY WITH ENDOBRONCHIAL ULTRASOUND N/A 03/12/2020  ? Procedure: VIDEO BRONCHOSCOPY WITH ENDOBRONCHIAL ULTRASOUND;  Surgeon: Garner Nash, DO;  Location: [facility];  Service: Pulmonary;  Laterality: N/A;  ? ? ?REVIEW OF SYSTEMS:   ?Review of Systems  ?Constitutional: Negative for appetite change, chills, fatigue, fever and unexpected weight change.  ?HENT:   Negative for mouth sores, nosebleeds, sore throat and trouble swallowing.   ?Eyes: Negative for eye problems and icterus.  ?Respiratory: Negative for cough, hemoptysis, shortness of breath and wheezing.   ?Cardiovascular: Negative for chest pain and leg swelling.  ?Gastrointestinal: Negative for abdominal pain, constipation, diarrhea, nausea and vomiting.  ?Genitourinary: Negative for bladder incontinence, difficulty urinating, dysuria, frequency and hematuria.   ?Musculoskeletal: Negative for back pain, gait problem, neck pain and neck stiffness.  ?Skin: Negative for itching and rash.  ?Neurological: Negative for dizziness, extremity weakness, gait problem, headaches, light-headedness and seizures.  ?Hematological: Negative  for adenopathy. Does not bruise/bleed easily.  ?Psychiatric/Behavioral: Negative for confusion, depression and sleep disturbance. The patient is not nervous/anxious.   ? ? ?PHYSICAL EXAMINATION:  ?There were no vitals taken for this visit. ? ?ECOG PERFORMANCE STATUS: {CHL ONC ECOG  VO:[id number]} ? ?Physical Exam  ?Constitutional: Oriented to person, place, and time and well-developed, well-nourished, and in no distress. No distress.  ?HENT:  ?Head: Normocephalic and atraumatic.  ?Mouth/Throat: Oropharynx is clear and moist. No oropharyngeal exudate.  ?Eyes: Conjunctivae are normal. Right eye exhibits no discharge. Left eye exhibits no discharge. No scleral icterus.  ?Neck: Normal range of motion. Neck supple.  ?Cardiovascular: Normal rate, regular rhythm, normal heart sounds and intact distal pulses.   ?Pulmonary/Chest: Effort normal and breath sounds normal. No respiratory distress. No wheezes. No rales.  ?Abdominal: Soft. Bowel sounds are normal. Exhibits no distension and no mass. There is no tenderness.  ?Musculoskeletal: Normal range of motion. Exhibits no edema.  ?Lymphadenopathy:  ?  No cervical adenopathy.  ?Neurological: Alert and oriented to person, place, and time. Exhibits normal muscle tone. Gait normal. Coordination normal.  ?Skin: Skin is warm and dry. No rash noted. Not diaphoretic. No erythema. No pallor.  ?Psychiatric: Mood, memory and judgment normal.  ?Vitals reviewed. ? ?LABORATORY DATA: ?Lab Results  ?Component Value Date  ? WBC 7.3 10/08/2020  ? HGB 11.4 (L) 10/08/2020  ? HCT 34.7 (L) 10/08/2020  ? MCV 90.1 10/08/2020  ? PLT 138 (L) 10/08/2020  ? ? ?  Chemistry   ?   ?Component Value Date/Time  ? NA 140 10/08/2020 1518  ? NA 139 02/05/2020 1503  ? K 4.6 10/08/2020 1518  ? CL 104 10/08/2020 1518  ? CO2 27 10/08/2020 1518  ? BUN 16 10/08/2020 1518  ? BUN 11 02/05/2020 1503  ? CREATININE 1.23 10/08/2020 1518  ?    ?Component Value Date/Time  ? CALCIUM 9.5 10/08/2020 1518  ? ALKPHOS 50 10/08/2020 1518  ? AST 16 10/08/2020 1518  ? ALT 16 10/08/2020 1518  ? BILITOT 0.3 10/08/2020 1518  ?  ? ? ? ?RADIOGRAPHIC STUDIES: ? ?No results found. ? ? ?ASSESSMENT/PLAN:  ?No problem-specific Assessment & Plan notes found for this encounter. ? ? ?No orders of the defined types were  placed in this encounter. ?  ? ?I spent {CHL ONC TIME VISIT - [id number]} counseling the patient face to face. The total time spent in the appointment was {CHL ONC TIME VISIT - FBPZW:[id number]}. ? ?Ferman Basilio L Sarkis Rhines, PA-C ?04/26/21 ? ?

## 2021-04-28 ENCOUNTER — Inpatient Hospital Stay: Payer: Medicare HMO | Admitting: Physician Assistant

## 2021-04-28 ENCOUNTER — Other Ambulatory Visit: Payer: Self-pay

## 2021-04-28 MED ORDER — ATORVASTATIN CALCIUM 40 MG PO TABS: 40.0000 mg | ORAL_TABLET | Freq: Every day | ORAL | 0 refills | Status: DC

## 2021-05-16 ENCOUNTER — Other Ambulatory Visit: Payer: Self-pay | Admitting: *Deleted

## 2021-05-16 MED ORDER — AMLODIPINE BESYLATE 10 MG PO TABS: 10.0000 mg | ORAL_TABLET | Freq: Every day | ORAL | 0 refills | Status: DC

## 2021-05-30 ENCOUNTER — Other Ambulatory Visit: Payer: Self-pay

## 2021-05-30 MED ORDER — ATORVASTATIN CALCIUM 40 MG PO TABS: 40.0000 mg | ORAL_TABLET | Freq: Every day | ORAL | 0 refills | Status: DC

## 2021-05-30 MED ORDER — LOSARTAN POTASSIUM 25 MG PO TABS: 25.0000 mg | ORAL_TABLET | Freq: Every day | ORAL | 0 refills | Status: DC

## 2021-06-01 ENCOUNTER — Other Ambulatory Visit: Payer: Self-pay

## 2021-06-09 ENCOUNTER — Other Ambulatory Visit: Payer: Self-pay

## 2021-06-13 ENCOUNTER — Other Ambulatory Visit: Payer: Self-pay

## 2021-06-13 NOTE — Addendum Note (Signed)
Addended by: Carter Kitten D on: 06/13/2021 11:51 AM ? ? Modules accepted: Orders ? ?

## 2021-07-12 DIAGNOSIS — C8300 Small cell B-cell lymphoma, unspecified site: Secondary | ICD-10-CM | POA: Diagnosis not present

## 2021-07-12 DIAGNOSIS — E7800 Pure hypercholesterolemia, unspecified: Secondary | ICD-10-CM | POA: Diagnosis not present

## 2021-07-12 DIAGNOSIS — R0600 Dyspnea, unspecified: Secondary | ICD-10-CM | POA: Diagnosis not present

## 2021-07-12 DIAGNOSIS — M25521 Pain in right elbow: Secondary | ICD-10-CM | POA: Diagnosis not present

## 2021-07-12 DIAGNOSIS — J439 Emphysema, unspecified: Secondary | ICD-10-CM | POA: Diagnosis not present

## 2021-07-18 ENCOUNTER — Telehealth: Payer: Self-pay | Admitting: Physician Assistant

## 2021-07-18 NOTE — Telephone Encounter (Signed)
Pt was referred back to [place] on 6/14 by Dr. Lina Sar office. Per Cassie, pt will need to have CT scan done before f/u. I called pt and spoke to him about scheduling his CT scan. I gave him the number to call to set up the scan and asked him to call me back once it was scheduled so I could set up his f/u. He verbalized understanding.

## 2021-07-21 ENCOUNTER — Ambulatory Visit (HOSPITAL_COMMUNITY)
Admission: RE | Admit: 2021-07-21 | Discharge: 2021-07-21 | Disposition: A | Discharge status: Home / Self Care | Payer: Medicare HMO | Source: Ambulatory Visit | Attending: Physician Assistant | Admitting: Physician Assistant

## 2021-07-21 DIAGNOSIS — K8020 Calculus of gallbladder without cholecystitis without obstruction: Secondary | ICD-10-CM | POA: Diagnosis not present

## 2021-07-21 DIAGNOSIS — R590 Localized enlarged lymph nodes: Secondary | ICD-10-CM | POA: Diagnosis not present

## 2021-07-21 DIAGNOSIS — K828 Other specified diseases of gallbladder: Secondary | ICD-10-CM | POA: Diagnosis not present

## 2021-07-21 DIAGNOSIS — C801 Malignant (primary) neoplasm, unspecified: Secondary | ICD-10-CM | POA: Diagnosis not present

## 2021-07-21 DIAGNOSIS — C8300 Small cell B-cell lymphoma, unspecified site: Secondary | ICD-10-CM | POA: Diagnosis not present

## 2021-07-21 DIAGNOSIS — N3289 Other specified disorders of bladder: Secondary | ICD-10-CM | POA: Diagnosis not present

## 2021-07-21 MED ORDER — SODIUM CHLORIDE (PF) 0.9 % IJ SOLN
INTRAMUSCULAR | Status: AC
  Filled 2021-07-21: qty 50

## 2021-07-21 MED ORDER — IOHEXOL 300 MG/ML  SOLN
100.0000 mL | Freq: Once | INTRAMUSCULAR | Status: AC | PRN
  Administered 2021-07-21: 100 mL via INTRAVENOUS

## 2021-07-25 ENCOUNTER — Other Ambulatory Visit: Payer: Self-pay

## 2021-07-25 ENCOUNTER — Inpatient Hospital Stay: Payer: Medicare HMO

## 2021-07-25 ENCOUNTER — Inpatient Hospital Stay: Payer: Medicare HMO | Attending: Physician Assistant | Admitting: Physician Assistant

## 2021-07-25 ENCOUNTER — Encounter: Payer: Self-pay | Admitting: Physician Assistant

## 2021-07-25 DIAGNOSIS — D696 Thrombocytopenia, unspecified: Secondary | ICD-10-CM | POA: Diagnosis not present

## 2021-07-25 DIAGNOSIS — R590 Localized enlarged lymph nodes: Secondary | ICD-10-CM

## 2021-07-25 DIAGNOSIS — C8300 Small cell B-cell lymphoma, unspecified site: Secondary | ICD-10-CM | POA: Diagnosis not present

## 2021-07-25 DIAGNOSIS — C9110 Chronic lymphocytic leukemia of B-cell type not having achieved remission: Secondary | ICD-10-CM | POA: Insufficient documentation

## 2021-07-25 DIAGNOSIS — D649 Anemia, unspecified: Secondary | ICD-10-CM | POA: Diagnosis not present

## 2021-07-25 LAB — CBC WITH DIFFERENTIAL (CANCER CENTER ONLY)
Abs Immature Granulocytes: 0 10*3/uL (ref 0.00–0.07)
Band Neutrophils: 2 %
Basophils Absolute: 0 10*3/uL (ref 0.0–0.1)
Basophils Relative: 0 %
Eosinophils Absolute: 0.2 10*3/uL (ref 0.0–0.5)
Eosinophils Relative: 1 %
HCT: 25.7 % — ABNORMAL LOW (ref 39.0–52.0)
Hemoglobin: 8.3 g/dL — ABNORMAL LOW (ref 13.0–17.0)
Lymphocytes Relative: 91 %
Lymphs Abs: 14 10*3/uL — ABNORMAL HIGH (ref 0.7–4.0)
MCH: 30.3 pg (ref 26.0–34.0)
MCHC: 32.3 g/dL (ref 30.0–36.0)
MCV: 93.8 fL (ref 80.0–100.0)
Monocytes Absolute: 0.2 10*3/uL (ref 0.1–1.0)
Monocytes Relative: 1 %
Neutro Abs: 1.1 10*3/uL — ABNORMAL LOW (ref 1.7–7.7)
Neutrophils Relative %: 5 %
Platelet Count: 49 10*3/uL — ABNORMAL LOW (ref 150–400)
RBC: 2.74 MIL/uL — ABNORMAL LOW (ref 4.22–5.81)
RDW: 19.8 % — ABNORMAL HIGH (ref 11.5–15.5)
WBC Count: 15.4 10*3/uL — ABNORMAL HIGH (ref 4.0–10.5)
nRBC: 0.6 % — ABNORMAL HIGH (ref 0.0–0.2)

## 2021-07-25 LAB — LACTATE DEHYDROGENASE: LDH: 248 U/L — ABNORMAL HIGH (ref 98–192)

## 2021-07-25 LAB — CMP (CANCER CENTER ONLY)
ALT: 18 U/L (ref 0–44)
AST: 17 U/L (ref 15–41)
Albumin: 4.2 g/dL (ref 3.5–5.0)
Alkaline Phosphatase: 46 U/L (ref 38–126)
Anion gap: 4 — ABNORMAL LOW (ref 5–15)
BUN: 15 mg/dL (ref 8–23)
CO2: 29 mmol/L (ref 22–32)
Calcium: 9.4 mg/dL (ref 8.9–10.3)
Chloride: 106 mmol/L (ref 98–111)
Creatinine: 0.93 mg/dL (ref 0.61–1.24)
GFR, Estimated: >60 mL/min (ref >60)
Glucose, Bld: 150 mg/dL — ABNORMAL HIGH (ref 70–99)
Potassium: 4.1 mmol/L (ref 3.5–5.1)
Sodium: 139 mmol/L (ref 135–145)
Total Bilirubin: 0.4 mg/dL (ref 0.3–1.2)
Total Protein: 7.8 g/dL (ref 6.5–8.1)

## 2021-07-25 LAB — SAMPLE TO BLOOD BANK

## 2021-07-25 MED ORDER — PREDNISONE 20 MG PO TABS: ORAL_TABLET | ORAL | 0 refills | Status: DC

## 2021-07-25 MED ORDER — OMEPRAZOLE 20 MG PO CPDR: 20.0000 mg | DELAYED_RELEASE_CAPSULE | Freq: Every day | ORAL | 1 refills | Status: DC

## 2021-07-26 LAB — IGG, IGA, IGM
IgA: 325 mg/dL (ref 61–437)
IgG (Immunoglobin G), Serum: 1818 mg/dL — ABNORMAL HIGH (ref 603–1613)
IgM (Immunoglobulin M), Srm: 61 mg/dL (ref 15–143)

## 2021-08-05 ENCOUNTER — Other Ambulatory Visit: Payer: Self-pay | Admitting: Physician Assistant

## 2021-08-05 DIAGNOSIS — C8300 Small cell B-cell lymphoma, unspecified site: Secondary | ICD-10-CM

## 2021-08-08 ENCOUNTER — Inpatient Hospital Stay: Payer: Medicare HMO

## 2021-08-08 ENCOUNTER — Inpatient Hospital Stay: Payer: Medicare HMO | Admitting: Internal Medicine

## 2021-08-08 ENCOUNTER — Telehealth: Payer: Self-pay

## 2021-08-08 NOTE — Telephone Encounter (Signed)
Called pt and left message requesting a call back to discuss/reschedule missed lab and MD visit appointment today.

## 2021-08-10 ENCOUNTER — Other Ambulatory Visit: Payer: Self-pay | Admitting: Physician Assistant

## 2021-08-10 DIAGNOSIS — C8300 Small cell B-cell lymphoma, unspecified site: Secondary | ICD-10-CM

## 2021-08-16 ENCOUNTER — Other Ambulatory Visit: Payer: Self-pay | Admitting: Physician Assistant

## 2021-08-16 DIAGNOSIS — C8300 Small cell B-cell lymphoma, unspecified site: Secondary | ICD-10-CM

## 2021-08-17 ENCOUNTER — Inpatient Hospital Stay (HOSPITAL_BASED_OUTPATIENT_CLINIC_OR_DEPARTMENT_OTHER): Payer: Medicare HMO | Admitting: Internal Medicine

## 2021-08-17 ENCOUNTER — Other Ambulatory Visit: Payer: Self-pay

## 2021-08-17 ENCOUNTER — Inpatient Hospital Stay: Payer: Medicare HMO | Attending: Physician Assistant

## 2021-08-17 VITALS — BP 145/69 | HR 79 | Temp 97.9°F | Resp 18 | Wt 224.2 lb

## 2021-08-17 DIAGNOSIS — D649 Anemia, unspecified: Secondary | ICD-10-CM | POA: Diagnosis not present

## 2021-08-17 DIAGNOSIS — Z79899 Other long term (current) drug therapy: Secondary | ICD-10-CM | POA: Diagnosis not present

## 2021-08-17 DIAGNOSIS — C8300 Small cell B-cell lymphoma, unspecified site: Secondary | ICD-10-CM

## 2021-08-17 DIAGNOSIS — C8305 Small cell B-cell lymphoma, lymph nodes of inguinal region and lower limb: Secondary | ICD-10-CM | POA: Insufficient documentation

## 2021-08-17 LAB — CBC WITH DIFFERENTIAL (CANCER CENTER ONLY)
Abs Immature Granulocytes: 0.09 10*3/uL — ABNORMAL HIGH (ref 0.00–0.07)
Basophils Absolute: 0 10*3/uL (ref 0.0–0.1)
Basophils Relative: 0 %
Eosinophils Absolute: 0 10*3/uL (ref 0.0–0.5)
Eosinophils Relative: 0 %
HCT: 25.4 % — ABNORMAL LOW (ref 39.0–52.0)
Hemoglobin: 8 g/dL — ABNORMAL LOW (ref 13.0–17.0)
Immature Granulocytes: 1 %
Lymphocytes Relative: 84 %
Lymphs Abs: 16.8 10*3/uL — ABNORMAL HIGH (ref 0.7–4.0)
MCH: 31.3 pg (ref 26.0–34.0)
MCHC: 31.5 g/dL (ref 30.0–36.0)
MCV: 99.2 fL (ref 80.0–100.0)
Monocytes Absolute: 0.4 10*3/uL (ref 0.1–1.0)
Monocytes Relative: 2 %
Neutro Abs: 2.7 10*3/uL (ref 1.7–7.7)
Neutrophils Relative %: 13 %
Platelet Count: 91 10*3/uL — ABNORMAL LOW (ref 150–400)
RBC: 2.56 MIL/uL — ABNORMAL LOW (ref 4.22–5.81)
RDW: 23.3 % — ABNORMAL HIGH (ref 11.5–15.5)
WBC Count: 20 10*3/uL — ABNORMAL HIGH (ref 4.0–10.5)
nRBC: 0.7 % — ABNORMAL HIGH (ref 0.0–0.2)

## 2021-08-17 LAB — SAMPLE TO BLOOD BANK

## 2021-08-17 NOTE — Progress Notes (Signed)
[facility] [phone]   [phone]  OFFICE PROGRESS NOTE  [name], MD 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  PRIOR THERAPY: None  CURRENT THERAPY: A tapered dose of prednisone and currently on 40 mg p.o. daily  INTERVAL HISTORY: Joseph Moyer 81 y.o. male returns to the clinic today for follow-up visit.  The patient is feeling fine today with no concerning complaints except for the lack of energy and fatigue.  He denied having any current chest pain but has shortness of breath with exertion with no cough or hemoptysis.  He has no nausea, vomiting, diarrhea or constipation.  He has no headache or visual changes.  He denied having any significant weight loss or night sweats.  He is currently on a tapered dose of prednisone because of the anemia and thrombocytopenia that could be secondary to his disease with small lymphocytic lymphoma.  His last CT scan of the chest, abdomen and pelvis showed mild increase in the lymphadenopathy but no bulky disease.  He has no bleeding, bruises or ecchymosis.  He is here today for evaluation and repeat blood work.  MEDICAL HISTORY: Past Medical History:  Diagnosis Date   Allergy    Arthritis    GERD (gastroesophageal reflux disease)    Hyperlipidemia    Hypertension     ALLERGIES:  is allergic to oxycodone, ramipril, and rosuvastatin.  MEDICATIONS:  Current Outpatient Medications  Medication Sig Dispense Refill   amLODipine (NORVASC) 10 MG tablet Take 1 tablet (10 mg total) by mouth daily. Please make overdue appt with Dr. Johney Frame before anymore refills. Thank you 1st attempt (Patient taking differently: Take 40 mg by mouth daily. Please make overdue appt with Dr. Johney Frame before anymore refills. Thank you 1st  attempt) 15 tablet 0   aspirin 81 MG tablet Take 81 mg by mouth every morning.      atorvastatin (LIPITOR) 40 MG tablet Take 1 tablet (40 mg total) by mouth daily. Please make overdue appt with Dr. Johney Frame before anymore refills. Thank you 3rd and FINAL attempt. 15 tablet 0   losartan (COZAAR) 25 MG tablet Take 1 tablet (25 mg total) by mouth daily. Please make overdue appt with Dr. Johney Frame before anymore refills. Thank you 3rd and Final attempt 15 tablet 0   Naphazoline-Glycerin (CLEAR EYES REDNESS RELIEF OP) Place 1 drop into both eyes daily.     OVER THE COUNTER MEDICATION Apply 1 application topically daily as needed (pain). Hemp cream     predniSONE (DELTASONE) 20 MG tablet Please take 5 tablets (100 mg) by mouth in the morning for 1 week, then take 4 tablets (80 mg) in the morning for 1 week, followed by 3 tablets in the morning (60 mg) for 1 week, followed by 2 tablets (40 mg) in the morning for 1 week, followed by 1 tablet (20 mg) in the morning for 1 week, followed by 1/2 a tablet for 1 week then stop 109 tablet 0   diphenhydrAMINE HCl, Sleep, 50 MG CAPS Take 50 mg by mouth at bedtime. (Patient not taking: Reported on 08/17/2021)     Magnesium ([employer] TRIPLE MAGNESIUM COMPLEX) 400 MG CAPS Take 400 mg by mouth daily. (Patient not taking: Reported on 08/17/2021)     omeprazole (PRILOSEC) 20 MG capsule TAKE 1 CAPSULE BY MOUTH EVERY  DAY (Patient not taking: Reported on 08/17/2021) 30 capsule 1   umeclidinium bromide (INCRUSE ELLIPTA) 62.5 MCG/INH AEPB Inhale 1 puff into the lungs daily. (Patient not taking: Reported on 08/17/2021) 60 each 5   No current facility-administered medications for this visit.    SURGICAL HISTORY:  Past Surgical History:  Procedure Laterality Date   BRONCHIAL NEEDLE ASPIRATION BIOPSY  03/12/2020   Procedure: BRONCHIAL NEEDLE ASPIRATION BIOPSIES;  Surgeon: Garner Nash, DO;  Location: [facility];  Service: Pulmonary;;   CIRCUMCISION     COLONOSCOPY WITH PROPOFOL  N/A 05/10/2015   Procedure: COLONOSCOPY WITH PROPOFOL;  Surgeon: Garlan Fair, MD;  Location: [facility];  Service: Endoscopy;  Laterality: N/A;   DECOMPRESSIVE LUMBAR LAMINECTOMY LEVEL 1 N/A 06/15/2018   Procedure: Gordy Levan decompression L1-L2/ complete inferior facetectomyof L1. Posterior lateral arthrodesiswith autograft bone L1-L2.;  Surgeon: Melina Schools, MD;  Location: [facility];  Service: Orthopedics;  Laterality: N/A;   HEMORRHOID SURGERY     VIDEO BRONCHOSCOPY WITH ENDOBRONCHIAL ULTRASOUND N/A 03/12/2020   Procedure: VIDEO BRONCHOSCOPY WITH ENDOBRONCHIAL ULTRASOUND;  Surgeon: Garner Nash, DO;  Location: [facility];  Service: Pulmonary;  Laterality: N/A;    REVIEW OF SYSTEMS:  A comprehensive review of systems was negative except for: Constitutional: positive for fatigue Respiratory: positive for dyspnea on exertion   PHYSICAL EXAMINATION: General appearance: alert, cooperative, fatigued, and no distress Head: Normocephalic, without obvious abnormality, atraumatic Neck: no adenopathy, no JVD, supple, symmetrical, trachea midline, and thyroid not enlarged, symmetric, no tenderness/mass/nodules Lymph nodes: Cervical, supraclavicular, and axillary nodes normal. Resp: clear to auscultation bilaterally Back: symmetric, no curvature. ROM normal. No CVA tenderness. Cardio: regular rate and rhythm, S1, S2 normal, no murmur, click, rub or gallop GI: soft, non-tender; bowel sounds normal; no masses,  no organomegaly Extremities: extremities normal, atraumatic, no cyanosis or edema  ECOG PERFORMANCE STATUS: 1 - Symptomatic but completely ambulatory  Blood pressure (!) 145/69, pulse 79, temperature 97.9 F (36.6 C), temperature source Oral, resp. rate 18, weight 224 lb 3 oz (101.7 kg), SpO2 99 %.  LABORATORY DATA: Lab Results  Component Value Date   WBC 20.0 (H) 08/17/2021   HGB 8.0 (L) 08/17/2021   HCT 25.4 (L) 08/17/2021   MCV 99.2 08/17/2021   PLT 91 (L) 08/17/2021       Chemistry      Component Value Date/Time   NA 139 07/25/2021 1231   NA 139 02/05/2020 1503   K 4.1 07/25/2021 1231   CL 106 07/25/2021 1231   CO2 29 07/25/2021 1231   BUN 15 07/25/2021 1231   BUN 11 02/05/2020 1503   CREATININE 0.93 07/25/2021 1231      Component Value Date/Time   CALCIUM 9.4 07/25/2021 1231   ALKPHOS 46 07/25/2021 1231   AST 17 07/25/2021 1231   ALT 18 07/25/2021 1231   BILITOT 0.4 07/25/2021 1231       RADIOGRAPHIC STUDIES: CT Abdomen Pelvis W Contrast  Result Date: 07/22/2021 CLINICAL DATA:  Mediastinal adenopathy. 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 * Tracking Code: BO * EXAM: CT CHEST, ABDOMEN, AND PELVIS WITH CONTRAST TECHNIQUE: Multidetector CT imaging of the chest, abdomen and pelvis was performed following the standard protocol during bolus administration of intravenous contrast. RADIATION DOSE REDUCTION: This exam was performed according to the departmental dose-optimization program which includes automated exposure control, adjustment of the mA and/or kV according to patient size and/or use of iterative reconstruction technique. CONTRAST:  141m OMNIPAQUE IOHEXOL  300 MG/ML  SOLN COMPARISON:  Prior CTs 10/18/2020 and 02/19/2020. PET-CT 03/08/2020. FINDINGS: CT CHEST FINDINGS Cardiovascular: No acute vascular findings. Atherosclerosis of the aorta, great vessels and coronary arteries. The heart size is normal. There is no pericardial effusion. Mediastinum/Nodes: Multiple moderately enlarged mediastinal and hilar lymph nodes are similar to the previous examination. There is a right paratracheal node measuring 1.5 cm on image 24/2 and a subcarinal node measuring 1.7 cm on image 30/2. There are 2 enlarging lymph nodes in the right axilla, measuring 1.8 cm on image 9/2 and 1.6 cm on image 18/2. Small left axillary lymph nodes have not significantly changed. The thyroid gland, trachea and esophagus demonstrate  no significant findings. Lungs/Pleura: No pleural effusion or pneumothorax. No suspicious pulmonary nodule or confluent airspace opacity. New ill-defined density in the right middle lobe (image 103/5), likely atelectasis. Musculoskeletal/Chest wall: No chest wall mass or suspicious osseous findings. Multilevel spondylosis with chronic partially calcified central disc protrusion at T9-10. CT ABDOMEN AND PELVIS FINDINGS Hepatobiliary: The liver is normal in density without suspicious focal abnormality. Calcified gallstones. The gallbladder is incompletely distended, without wall thickening or surrounding inflammation. No evidence of biliary dilatation. Pancreas: Unremarkable. No pancreatic ductal dilatation or surrounding inflammatory changes. Spleen: Normal in size without focal abnormality. Adrenals/Urinary Tract: Both adrenal glands appear normal. The kidneys appear normal without evidence of urinary tract calculus, suspicious lesion or hydronephrosis. Stable mild diffuse bladder wall thickening. No focal abnormality identified. Stomach/Bowel: Enteric contrast was administered and has passed into the mid colon. Prominent ingested material in the stomach. There are diverticular changes throughout the colon, greatest within the descending and sigmoid colon. No bowel distension, wall thickening or surrounding inflammation. The appendix appears normal. Vascular/Lymphatic: Numerous enlarged retroperitoneal, mesenteric and pelvic lymph nodes are again noted. Lymph nodes in the porta hepatis have mildly progressed, including a 1.9 cm portacaval node on image 66/2 (previously 1.5 cm). Left periaortic node measures 1.8 cm on image 78/2 (previously 1.5 cm). Right external iliac node measures 1.7 cm on image 110/2 (previously 1.1 cm). Aortic and branch vessel atherosclerosis without evidence of large vessel occlusion or aneurysm. Reproductive: Prostatomegaly and contour irregularity of the median lobe, similar to prior study.  Other: No evidence of abdominal wall mass or hernia. No ascites. Musculoskeletal: No acute or significant osseous findings. Multilevel spondylosis. Previous posterior decompression and fusion at L1-2. IMPRESSION: 1. Mildly progressive lymphadenopathy as described, most notably within the right axilla, the porta hepatis, retroperitoneum and pelvis. Mediastinal adenopathy has not significantly changed. 2. No splenomegaly or focal visceral organ lesions identified. 3. No acute findings demonstrated. 4. Prostatomegaly with chronic bladder wall thickening suggesting bladder outlet obstruction. 5. 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 Electronically Signed   By: Richardean Sale M.D.   On: 07/22/2021 14:35   CT Chest W Contrast  Result Date: 07/22/2021 CLINICAL DATA:  Mediastinal adenopathy. 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 * Tracking Code: BO * EXAM: CT CHEST, ABDOMEN, AND PELVIS WITH CONTRAST TECHNIQUE: Multidetector CT imaging of the chest, abdomen and pelvis was performed following the standard protocol during bolus administration of intravenous contrast. RADIATION DOSE REDUCTION: This exam was performed according to the departmental dose-optimization program which includes automated exposure control, adjustment of the mA and/or kV according to patient size and/or use of iterative reconstruction technique. CONTRAST:  151m OMNIPAQUE IOHEXOL 300 MG/ML  SOLN COMPARISON:  Prior CTs 10/18/2020 and 02/19/2020. PET-CT 03/08/2020. FINDINGS: CT CHEST FINDINGS Cardiovascular: No acute vascular findings. Atherosclerosis  of the aorta, great vessels and coronary arteries. The heart size is normal. There is no pericardial effusion. Mediastinum/Nodes: Multiple moderately enlarged mediastinal and hilar lymph nodes are similar to the previous examination. There is a right paratracheal node measuring 1.5 cm on image 24/2 and a subcarinal node measuring 1.7  cm on image 30/2. There are 2 enlarging lymph nodes in the right axilla, measuring 1.8 cm on image 9/2 and 1.6 cm on image 18/2. Small left axillary lymph nodes have not significantly changed. The thyroid gland, trachea and esophagus demonstrate no significant findings. Lungs/Pleura: No pleural effusion or pneumothorax. No suspicious pulmonary nodule or confluent airspace opacity. New ill-defined density in the right middle lobe (image 103/5), likely atelectasis. Musculoskeletal/Chest wall: No chest wall mass or suspicious osseous findings. Multilevel spondylosis with chronic partially calcified central disc protrusion at T9-10. CT ABDOMEN AND PELVIS FINDINGS Hepatobiliary: The liver is normal in density without suspicious focal abnormality. Calcified gallstones. The gallbladder is incompletely distended, without wall thickening or surrounding inflammation. No evidence of biliary dilatation. Pancreas: Unremarkable. No pancreatic ductal dilatation or surrounding inflammatory changes. Spleen: Normal in size without focal abnormality. Adrenals/Urinary Tract: Both adrenal glands appear normal. The kidneys appear normal without evidence of urinary tract calculus, suspicious lesion or hydronephrosis. Stable mild diffuse bladder wall thickening. No focal abnormality identified. Stomach/Bowel: Enteric contrast was administered and has passed into the mid colon. Prominent ingested material in the stomach. There are diverticular changes throughout the colon, greatest within the descending and sigmoid colon. No bowel distension, wall thickening or surrounding inflammation. The appendix appears normal. Vascular/Lymphatic: Numerous enlarged retroperitoneal, mesenteric and pelvic lymph nodes are again noted. Lymph nodes in the porta hepatis have mildly progressed, including a 1.9 cm portacaval node on image 66/2 (previously 1.5 cm). Left periaortic node measures 1.8 cm on image 78/2 (previously 1.5 cm). Right external iliac node  measures 1.7 cm on image 110/2 (previously 1.1 cm). Aortic and branch vessel atherosclerosis without evidence of large vessel occlusion or aneurysm. Reproductive: Prostatomegaly and contour irregularity of the median lobe, similar to prior study. Other: No evidence of abdominal wall mass or hernia. No ascites. Musculoskeletal: No acute or significant osseous findings. Multilevel spondylosis. Previous posterior decompression and fusion at L1-2. IMPRESSION: 1. Mildly progressive lymphadenopathy as described, most notably within the right axilla, the porta hepatis, retroperitoneum and pelvis. Mediastinal adenopathy has not significantly changed. 2. No splenomegaly or focal visceral organ lesions identified. 3. No acute findings demonstrated. 4. Prostatomegaly with chronic bladder wall thickening suggesting bladder outlet obstruction. 5. 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 Electronically Signed   By: Richardean Sale M.D.   On: 07/22/2021 14:35     ASSESSMENT AND PLAN: This is a very pleasant 81 years old African-American male recently diagnosed with a small lymphocytic lymphoma with generalized lymphadenopathy in the neck, chest, axilla, abdomen and pelvis. The core biopsy of the left inguinal lymph node was consistent with the above diagnosis. The patient was seen recently with worsening anemia and thrombocytopenia.  He has imaging studies that showed mildly progressive lymphadenopathy in the right axilla, porta hepatis, retroperitoneum and pelvis but not the mediastinal lymphadenopathy. He is currently on a tapered dose of prednisone and currently on 40 mg p.o. daily.  He has been tolerating his treatment well with no concerning complaints but he continues to have mild fatigue. Repeat CBC today showed improvement of the platelets count up to 91,000 but he continues to have anemia with hemoglobin of 8.0 and hematocrit 25.4%. I recommended for the  patient to continue his current treatment with  prednisone for now. I will see him back for follow-up visit in 2 weeks for evaluation and repeat blood work. If no improvement in his anemia, may consider The patient for additional treatment for the small lymphocytic lymphoma. He was advised to call immediately if he has any other concerning symptoms in the interval. The patient voices understanding of current disease status and treatment options and is in agreement with the current care plan.  All questions were answered. The patient knows to call the clinic with any problems, questions or concerns. We can certainly see the patient much sooner if necessary.  The total time spent in the appointment was 20 minutes.  Disclaimer: This note was dictated with voice recognition software. Similar sounding words can inadvertently be transcribed and may not be corrected upon review.

## 2021-08-25 ENCOUNTER — Telehealth: Payer: Self-pay | Admitting: Internal Medicine

## 2021-08-25 NOTE — Telephone Encounter (Signed)
Called patient regarding upcoming August appointment, patient is notified. 

## 2021-08-31 ENCOUNTER — Telehealth: Payer: Self-pay | Admitting: Pharmacist

## 2021-08-31 ENCOUNTER — Other Ambulatory Visit (HOSPITAL_COMMUNITY): Payer: Self-pay

## 2021-08-31 ENCOUNTER — Encounter: Payer: Self-pay | Admitting: Internal Medicine

## 2021-08-31 ENCOUNTER — Inpatient Hospital Stay: Payer: Medicare HMO | Attending: Physician Assistant

## 2021-08-31 ENCOUNTER — Telehealth: Payer: Self-pay | Admitting: Pharmacy Technician

## 2021-08-31 ENCOUNTER — Inpatient Hospital Stay (HOSPITAL_BASED_OUTPATIENT_CLINIC_OR_DEPARTMENT_OTHER): Payer: Medicare HMO | Admitting: Internal Medicine

## 2021-08-31 ENCOUNTER — Other Ambulatory Visit: Payer: Self-pay

## 2021-08-31 ENCOUNTER — Inpatient Hospital Stay: Payer: Medicare HMO

## 2021-08-31 VITALS — BP 148/67 | HR 51 | Temp 97.9°F | Resp 17 | Wt 216.4 lb

## 2021-08-31 DIAGNOSIS — Z5112 Encounter for antineoplastic immunotherapy: Secondary | ICD-10-CM | POA: Insufficient documentation

## 2021-08-31 DIAGNOSIS — D696 Thrombocytopenia, unspecified: Secondary | ICD-10-CM | POA: Diagnosis not present

## 2021-08-31 DIAGNOSIS — T451X5A Adverse effect of antineoplastic and immunosuppressive drugs, initial encounter: Secondary | ICD-10-CM | POA: Diagnosis not present

## 2021-08-31 DIAGNOSIS — C8300 Small cell B-cell lymphoma, unspecified site: Secondary | ICD-10-CM

## 2021-08-31 DIAGNOSIS — Z79899 Other long term (current) drug therapy: Secondary | ICD-10-CM | POA: Insufficient documentation

## 2021-08-31 DIAGNOSIS — C8308 Small cell B-cell lymphoma, lymph nodes of multiple sites: Secondary | ICD-10-CM | POA: Diagnosis not present

## 2021-08-31 DIAGNOSIS — D630 Anemia in neoplastic disease: Secondary | ICD-10-CM | POA: Diagnosis not present

## 2021-08-31 DIAGNOSIS — D701 Agranulocytosis secondary to cancer chemotherapy: Secondary | ICD-10-CM | POA: Insufficient documentation

## 2021-08-31 DIAGNOSIS — I878 Other specified disorders of veins: Secondary | ICD-10-CM | POA: Diagnosis not present

## 2021-08-31 DIAGNOSIS — D649 Anemia, unspecified: Secondary | ICD-10-CM | POA: Diagnosis not present

## 2021-08-31 LAB — CBC WITH DIFFERENTIAL (CANCER CENTER ONLY)
Abs Immature Granulocytes: 0.01 10*3/uL (ref 0.00–0.07)
Basophils Absolute: 0 10*3/uL (ref 0.0–0.1)
Basophils Relative: 0 %
Eosinophils Absolute: 0 10*3/uL (ref 0.0–0.5)
Eosinophils Relative: 0 %
HCT: 22.4 % — ABNORMAL LOW (ref 39.0–52.0)
Hemoglobin: 7.4 g/dL — ABNORMAL LOW (ref 13.0–17.0)
Immature Granulocytes: 0 %
Lymphocytes Relative: 52 %
Lymphs Abs: 2.3 10*3/uL (ref 0.7–4.0)
MCH: 31.9 pg (ref 26.0–34.0)
MCHC: 33 g/dL (ref 30.0–36.0)
MCV: 96.6 fL (ref 80.0–100.0)
Monocytes Absolute: 0.1 10*3/uL (ref 0.1–1.0)
Monocytes Relative: 3 %
Neutro Abs: 2 10*3/uL (ref 1.7–7.7)
Neutrophils Relative %: 45 %
Platelet Count: 70 10*3/uL — ABNORMAL LOW (ref 150–400)
RBC: 2.32 MIL/uL — ABNORMAL LOW (ref 4.22–5.81)
RDW: 21.4 % — ABNORMAL HIGH (ref 11.5–15.5)
WBC Count: 4.4 10*3/uL (ref 4.0–10.5)
nRBC: 0.5 % — ABNORMAL HIGH (ref 0.0–0.2)

## 2021-08-31 LAB — DIRECT ANTIGLOBULIN TEST (NOT AT ARMC)
DAT, IgG: NEGATIVE
DAT, complement: NEGATIVE

## 2021-08-31 LAB — CMP (CANCER CENTER ONLY)
ALT: 48 U/L — ABNORMAL HIGH (ref 0–44)
AST: 33 U/L (ref 15–41)
Albumin: 3.7 g/dL (ref 3.5–5.0)
Alkaline Phosphatase: 54 U/L (ref 38–126)
Anion gap: 8 (ref 5–15)
BUN: 25 mg/dL — ABNORMAL HIGH (ref 8–23)
CO2: 21 mmol/L — ABNORMAL LOW (ref 22–32)
Calcium: 8.6 mg/dL — ABNORMAL LOW (ref 8.9–10.3)
Chloride: 107 mmol/L (ref 98–111)
Creatinine: 1.15 mg/dL (ref 0.61–1.24)
GFR, Estimated: >60 mL/min (ref >60)
Glucose, Bld: 156 mg/dL — ABNORMAL HIGH (ref 70–99)
Potassium: 3.9 mmol/L (ref 3.5–5.1)
Sodium: 136 mmol/L (ref 135–145)
Total Bilirubin: 0.6 mg/dL (ref 0.3–1.2)
Total Protein: 7.7 g/dL (ref 6.5–8.1)

## 2021-08-31 LAB — URIC ACID: Uric Acid, Serum: 5.6 mg/dL (ref 3.7–8.6)

## 2021-08-31 LAB — VITAMIN B12: Vitamin B-12: 308 pg/mL (ref 180–914)

## 2021-08-31 LAB — HEPATITIS PANEL, ACUTE
HCV Ab: NONREACTIVE
Hep A IgM: NONREACTIVE
Hep B C IgM: NONREACTIVE
Hepatitis B Surface Ag: NONREACTIVE

## 2021-08-31 LAB — FOLATE: Folate: 17.7 ng/mL (ref >5.9)

## 2021-08-31 LAB — IRON AND IRON BINDING CAPACITY (CC-WL,HP ONLY)
Iron: 41 ug/dL — ABNORMAL LOW (ref 45–182)
Saturation Ratios: 13 % — ABNORMAL LOW (ref 17.9–39.5)
TIBC: 328 ug/dL (ref 250–450)
UIBC: 287 ug/dL

## 2021-08-31 LAB — LACTATE DEHYDROGENASE: LDH: 246 U/L — ABNORMAL HIGH (ref 98–192)

## 2021-08-31 MED ORDER — INTEGRA PLUS PO CAPS: 1.0000 | ORAL_CAPSULE | Freq: Every day | ORAL | 3 refills | Status: AC

## 2021-08-31 MED ORDER — VENETOCLAX 10 & 50 & 100 MG PO TBPK
ORAL_TABLET | ORAL | 0 refills | Status: DC
  Filled 2021-08-31: qty 42, fill #0

## 2021-08-31 MED ORDER — ALLOPURINOL 100 MG PO TABS: 300.0000 mg | ORAL_TABLET | Freq: Two times a day (BID) | ORAL | 3 refills | Status: DC

## 2021-08-31 MED ORDER — PROCHLORPERAZINE MALEATE 10 MG PO TABS: 10.0000 mg | ORAL_TABLET | Freq: Four times a day (QID) | ORAL | 0 refills | Status: DC | PRN

## 2021-08-31 NOTE — Progress Notes (Signed)
[name]   [phone]  OFFICE PROGRESS NOTE  [name], MD 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  PRIOR THERAPY: A tapered dose of prednisone but his anemia and thrombocytopenia were refractory to the steroids.  CURRENT THERAPY: Systemic treatment with obinutuzumab every 4 weeks for 6 months.  First dose starting next week.  In addition the patient will be treatment with venetoclax initially with a starting dose to be increased gradually to the standard dose of 400 mg p.o. daily for a total of 1 year.  INTERVAL HISTORY: Joseph Moyer 81 y.o. male returns to the clinic today for follow-up visit.  The patient is feeling fine today with no concerning complaints except for the fatigue.  He denied having any chest pain but has shortness of breath with exertion with no cough or hemoptysis.  He has no nausea, vomiting, diarrhea or constipation.  He has no headache or visual changes.  He denied having any recent weight loss or night sweats.  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  The patient is here today for evaluation and repeat blood work as well as discussion of his treatment options.  MEDICAL HISTORY: Past Medical History:  Diagnosis Date   Allergy    Arthritis    GERD (gastroesophageal reflux disease)    Hyperlipidemia    Hypertension     ALLERGIES:  is allergic to oxycodone, ramipril, and rosuvastatin.  MEDICATIONS:  Current Outpatient Medications  Medication Sig Dispense Refill   amLODipine (NORVASC) 10 MG tablet Take 1 tablet (10 mg total) by mouth daily. Please make overdue appt with Dr.  Johney Frame before anymore refills. Thank you 1st attempt (Patient taking differently: Take 40 mg by mouth daily. Please make overdue appt with Dr. Johney Frame before anymore refills. Thank you 1st attempt) 15 tablet 0   aspirin 81 MG tablet Take 81 mg by mouth every morning.      atorvastatin (LIPITOR) 40 MG tablet Take 1 tablet (40 mg total) by mouth daily. Please make overdue appt with Dr. Johney Frame before anymore refills. Thank you 3rd and FINAL attempt. 15 tablet 0   diphenhydrAMINE HCl, Sleep, 50 MG CAPS Take 50 mg by mouth at bedtime. (Patient not taking: Reported on 08/17/2021)     losartan (COZAAR) 25 MG tablet Take 1 tablet (25 mg total) by mouth daily. Please make overdue appt with Dr. Johney Frame before anymore refills. Thank you 3rd and Final attempt 15 tablet 0   Magnesium ([employer] TRIPLE MAGNESIUM COMPLEX) 400 MG CAPS Take 400 mg by mouth daily. (Patient not taking: Reported on 08/17/2021)     Naphazoline-Glycerin (CLEAR EYES REDNESS RELIEF OP) Place 1 drop into both eyes daily.     omeprazole (PRILOSEC) 20 MG capsule TAKE 1 CAPSULE BY MOUTH EVERY DAY (Patient not taking: Reported on 08/17/2021) 30 capsule 1   OVER THE COUNTER MEDICATION Apply 1 application topically daily as needed (pain). Hemp cream     predniSONE (DELTASONE) 20 MG tablet Please take 5 tablets (100 mg) by mouth in the morning for 1 week, then take 4 tablets (80 mg) in the morning for 1 week, followed by 3 tablets in the morning (60 mg) for  1 week, followed by 2 tablets (40 mg) in the morning for 1 week, followed by 1 tablet (20 mg) in the morning for 1 week, followed by 1/2 a tablet for 1 week then stop 109 tablet 0   umeclidinium bromide (INCRUSE ELLIPTA) 62.5 MCG/INH AEPB Inhale 1 puff into the lungs daily. (Patient not taking: Reported on 08/17/2021) 60 each 5   No current facility-administered medications for this visit.    SURGICAL HISTORY:  Past Surgical History:  Procedure Laterality Date   BRONCHIAL NEEDLE ASPIRATION  BIOPSY  03/12/2020   Procedure: BRONCHIAL NEEDLE ASPIRATION BIOPSIES;  Surgeon: Garner Nash, DO;  Location: [facility];  Service: Pulmonary;;   CIRCUMCISION     COLONOSCOPY WITH PROPOFOL N/A 05/10/2015   Procedure: COLONOSCOPY WITH PROPOFOL;  Surgeon: Garlan Fair, MD;  Location: [facility];  Service: Endoscopy;  Laterality: N/A;   DECOMPRESSIVE LUMBAR LAMINECTOMY LEVEL 1 N/A 06/15/2018   Procedure: Gordy Levan decompression L1-L2/ complete inferior facetectomyof L1. Posterior lateral arthrodesiswith autograft bone L1-L2.;  Surgeon: Melina Schools, MD;  Location: [facility];  Service: Orthopedics;  Laterality: N/A;   HEMORRHOID SURGERY     VIDEO BRONCHOSCOPY WITH ENDOBRONCHIAL ULTRASOUND N/A 03/12/2020   Procedure: VIDEO BRONCHOSCOPY WITH ENDOBRONCHIAL ULTRASOUND;  Surgeon: Garner Nash, DO;  Location: [facility];  Service: Pulmonary;  Laterality: N/A;    REVIEW OF SYSTEMS:  Constitutional: positive for fatigue Eyes: negative Ears, nose, mouth, throat, and face: negative Respiratory: positive for dyspnea on exertion Cardiovascular: negative Gastrointestinal: negative Genitourinary:negative Integument/breast: negative Hematologic/lymphatic: negative Musculoskeletal:negative Neurological: negative Behavioral/Psych: negative Endocrine: negative Allergic/Immunologic: negative   PHYSICAL EXAMINATION: General appearance: alert, cooperative, fatigued, and no distress Head: Normocephalic, without obvious abnormality, atraumatic Neck: no adenopathy, no JVD, supple, symmetrical, trachea midline, and thyroid not enlarged, symmetric, no tenderness/mass/nodules Lymph nodes: Cervical, supraclavicular, and axillary nodes normal. Resp: clear to auscultation bilaterally Back: symmetric, no curvature. ROM normal. No CVA tenderness. Cardio: regular rate and rhythm, S1, S2 normal, no murmur, click, rub or gallop GI: soft, non-tender; bowel sounds normal; no masses,  no organomegaly Extremities:  extremities normal, atraumatic, no cyanosis or edema Neurologic: Alert and oriented X 3, normal strength and tone. Normal symmetric reflexes. Normal coordination and gait  ECOG PERFORMANCE STATUS: 1 - Symptomatic but completely ambulatory  Blood pressure (!) 148/67, pulse (!) 51, temperature 97.9 F (36.6 C), temperature source Oral, resp. rate 17, weight 216 lb 7 oz (98.2 kg), SpO2 97 %.  LABORATORY DATA: Lab Results  Component Value Date   WBC 4.4 08/31/2021   HGB 7.4 (L) 08/31/2021   HCT 22.4 (L) 08/31/2021   MCV 96.6 08/31/2021   PLT 70 (L) 08/31/2021      Chemistry      Component Value Date/Time   NA 139 07/25/2021 1231   NA 139 02/05/2020 1503   K 4.1 07/25/2021 1231   CL 106 07/25/2021 1231   CO2 29 07/25/2021 1231   BUN 15 07/25/2021 1231   BUN 11 02/05/2020 1503   CREATININE 0.93 07/25/2021 1231      Component Value Date/Time   CALCIUM 9.4 07/25/2021 1231   ALKPHOS 46 07/25/2021 1231   AST 17 07/25/2021 1231   ALT 18 07/25/2021 1231   BILITOT 0.4 07/25/2021 1231       RADIOGRAPHIC STUDIES: No results found.   ASSESSMENT AND PLAN: This is a very pleasant 81 years old African-American male recently diagnosed with a small lymphocytic lymphoma with generalized lymphadenopathy in the neck, chest, axilla, abdomen and pelvis. The  core biopsy of the left inguinal lymph node was consistent with the above diagnosis. The patient was seen recently with worsening anemia and thrombocytopenia.  He has imaging studies that showed mildly progressive lymphadenopathy in the right axilla, porta hepatis, retroperitoneum and pelvis but not the mediastinal lymphadenopathy. The patient was treated with a tapered dose of prednisone with no improvement in his anemia and thrombocytopenia. I had a lengthy discussion with the patient today about his current condition and treatment options.  The patient definitely has indication now for treatment of his condition especially with the  persistent refractory anemia and thrombocytopenia. I discussed with him several options for management of his condition including treatment with acalabrutinib or zanubrutinib but this would require daily treatment and not a fixed duration versus treatment with obinutuzumab and venetoclax which has affected duration treatment of 1 year. I discussed with the patient the adverse effect of this treatment and he is interested in proceeding with the effects of the duration treatment with obinutuzumab and venetoclax. He will meet with the pharmacist for oral oncolytic for education about venetoclax. He is expected to start the first dose of his treatment with obinutuzumab next week. We will check hepatitis panel today. I will also start the patient on prophylactic allopurinol and continue to monitor his blood count closely. For the anemia and thrombocytopenia this is immune mediated secondary to his condition with a small lymphocytic lymphoma and we hope that it would improve after starting his systemic therapy. I will see the patient back for follow-up visit next week before the start of the first dose of his treatment with obinutuzumab. We will arrange for the patient to have a chemotherapy location class before the first dose of his treatment. He was advised to call immediately if he has any other concerning symptoms in the interval.  If no improvement in his anemia, may consider The patient for additional treatment for the small lymphocytic lymphoma.  The patient voices understanding of current disease status and treatment options and is in agreement with the current care plan.  All questions were answered. The patient knows to call the clinic with any problems, questions or concerns. We can certainly see the patient much sooner if necessary.  The total time spent in the appointment was 35 minutes.  Disclaimer: This note was dictated with voice recognition software. Similar sounding words can  inadvertently be transcribed and may not be corrected upon review.

## 2021-08-31 NOTE — Telephone Encounter (Signed)
Oral Oncology Patient Advocate Encounter  Was successful in securing patient a $8,000 grant from [employer] to provide copayment coverage for [place].  This will keep the out of pocket expense at $0.     [employer] ID: [id number]  I have spoken with the patient.   The billing information is as follows and has been shared with [facility].    RxBin: [id number] PCN: [id number] Member ID: [id number] Group ID: [id number] Dates of Eligibility: 08/01/2021 through 08/01/2022  Fund:  [place], CPhT-Adv Pharmacy Patient Advocate Specialist [facility] Patient Advocate Team Direct Number: [phone]  Fax: [phone]

## 2021-08-31 NOTE — Progress Notes (Signed)
START ON PATHWAY REGIMEN - Lymphoma and CLL     Cycle 1: A cycle is 28 days:     Venetoclax      Obinutuzumab      Obinutuzumab      Obinutuzumab    Cycle 2: A cycle is 28 days:     Venetoclax      Venetoclax      Venetoclax      Venetoclax      Obinutuzumab    Cycles 3 through 6: A cycle is 28 days:     Venetoclax      Obinutuzumab    Cycles 7 through 12: A cycle is 28 days:     Venetoclax   **Always confirm dose/schedule in your pharmacy ordering system**  Patient Characteristics: Small Lymphocytic Lymphoma (SLL), Treatment Indicated, First Line Disease Type: Small Lymphocytic Lymphoma (SLL) Disease Type: Not Applicable Disease Type: Not Applicable Treatment Indicated<= Treatment Indicated Line of Therapy: First Line Intent of Therapy: Non-Curative / Palliative Intent, Discussed with Patient

## 2021-08-31 NOTE — Telephone Encounter (Signed)
Oral Oncology Patient Advocate Encounter  Prior Authorization for Lynita [facility] has been approved.    PA# [id number] Effective dates: 08/31/2021 through 01/29/2022  Patients co-pay is $1,112.79.    Lady Deutscher, CPhT-Adv Pharmacy Patient Advocate Specialist [facility] Patient Advocate Team Direct Number: [phone]  Fax: [phone]

## 2021-08-31 NOTE — Telephone Encounter (Signed)
Oral Oncology Patient Advocate Encounter   Received notification that prior authorization for Venclexta is required.   PA submitted on 08/31/2021 Key [id number] Status is pending     Joseph Moyer, CPhT-Adv Pharmacy Patient Advocate Specialist [name] Patient Advocate Team Direct Number: [phone]  Fax: [phone]

## 2021-09-01 ENCOUNTER — Other Ambulatory Visit: Payer: Self-pay

## 2021-09-01 ENCOUNTER — Other Ambulatory Visit (HOSPITAL_COMMUNITY): Payer: Self-pay

## 2021-09-01 LAB — FERRITIN: Ferritin: 269 ng/mL (ref 24–336)

## 2021-09-01 MED ORDER — VENETOCLAX 10 & 50 & 100 MG PO TBPK
ORAL_TABLET | ORAL | 0 refills | Status: DC
  Filled 2021-09-01: qty 42, fill #0
  Filled 2021-09-05: qty 42, 28d supply, fill #0

## 2021-09-01 NOTE — Telephone Encounter (Signed)
Oral Oncology Pharmacist Encounter  Received new prescription for Venclexta (venetoclax) for the treatment of small lymphoctyic lymphoma in conjunction with obinutuzumab, planned duration ~1 year.  CBC w/ Diff, CMP from 08/31/21 assessed, noted pt with pltc of 70 K/uL - recommend close monitoring after initiation of Venclexta. Pt with Scr of 1.15 mg/dL (CrCl ~73.5 mL/min). Uric acid from 08/31/21 WNL at 5.6 mg/dL - patient will be started on allopurinol for TLS PPX. Prescription dose and frequency assessed for appropriateness.  Current medication list in Epic reviewed, no relevant/significant DDIs with Venclexta identified.  Evaluated chart and no patient barriers to medication adherence noted.   Patient agreement for treatment documented in MD note on 08/31/21.  Prescription has been e-scribed to the [facility] for benefits analysis and approval.  Oral Oncology Clinic will continue to follow for insurance authorization, copayment issues, initial counseling and start date.  Leron Croak, PharmD, BCPS, [facility] Hematology/Oncology Clinical Pharmacist [facility] [phone] 09/01/2021 9:35 AM

## 2021-09-02 ENCOUNTER — Other Ambulatory Visit: Payer: Medicare HMO

## 2021-09-02 ENCOUNTER — Telehealth: Payer: Self-pay | Admitting: Internal Medicine

## 2021-09-02 ENCOUNTER — Other Ambulatory Visit: Payer: Self-pay

## 2021-09-02 ENCOUNTER — Other Ambulatory Visit (HOSPITAL_COMMUNITY): Payer: Self-pay

## 2021-09-02 DIAGNOSIS — C8300 Small cell B-cell lymphoma, unspecified site: Secondary | ICD-10-CM

## 2021-09-02 LAB — HAPTOGLOBIN: Haptoglobin: 288 mg/dL (ref 34–355)

## 2021-09-02 NOTE — Progress Notes (Signed)
Pharmacist Chemotherapy Monitoring - Initial Assessment    Anticipated start date: 09/07/21   The following has been reviewed per standard work regarding the patient's treatment regimen: The patient's diagnosis, treatment plan and drug doses, and organ/hematologic function Lab orders and baseline tests specific to treatment regimen  The treatment plan start date, drug sequencing, and pre-medications Prior authorization status  Patient's documented medication list, including drug-drug interaction screen and prescriptions for anti-emetics and supportive care specific to the treatment regimen The drug concentrations, fluid compatibility, administration routes, and timing of the medications to be used The patient's access for treatment and lifetime cumulative dose history, if applicable  The patient's medication allergies and previous infusion related reactions, if applicable   Changes made to treatment plan:  Added C1D15 to plan.  Notified MD to sign.  Follow up needed:  Pending authorization for treatment    Kennith Center, Pharm.D., CPP 09/02/2021'@9'$ :21 AM

## 2021-09-02 NOTE — Progress Notes (Addendum)
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  PRIOR THERAPY:  A tapered dose of prednisone but his anemia and thrombocytopenia were refractory to the steroids.  CURRENT THERAPY:  Systemic treatment with obinutuzumab every 4 weeks for 6 months.  First dose starting 09/07/21.  In addition the patient will be treatment with venetoclax initially with a starting dose to be increased gradually to the standard dose of 400 mg p.o. daily for a total of 1 year.  INTERVAL HISTORY: Joseph Moyer 81 y.o. male returns to the clinic today for a follow-up visit.  The patient was recently found to have progressive small lymphocytic lymphoma and he is expected to start treatment for this today with obinutuzumab.  He received his venetoclax prescription and is starting this on day 22 cycle #1 which is 09/28/21.  The patient has been having anemia and thrombocytopenia refractory to steroids.  The patient denies any abnormal bleeding or bruising. He reports fatigue and dyspnea on exertion for several months. He has not noticed any acute worsening of this since last being seen.  He denies any fever, chills, or night sweats. He reports normal appetite but it appears he lost weight.  Denies any lymphadenopathy.  Denies any nausea, vomiting, diarrhea, constipation, abdominal pain, abdominal fullness, early satiety.   Denies any signs or symptoms of infection including sore throat, skin infections, or dysuria.  He is here today for evaluation and repeat blood work before undergoing cycle #1.    MEDICAL HISTORY: Past Medical History:  Diagnosis Date   Allergy    Arthritis    CAD in native artery    COPD (chronic obstructive pulmonary disease) (HCC)    GERD  (gastroesophageal reflux disease)    Hyperlipidemia    Hypertension    Mild carotid artery disease (HCC)    Small lymphocytic lymphoma (HCC)     ALLERGIES:  is allergic to oxycodone, ramipril, and rosuvastatin.  MEDICATIONS:  Current Outpatient Medications  Medication Sig Dispense Refill   allopurinol (ZYLOPRIM) 100 MG tablet Take 3 tablets (300 mg total) by mouth 2 (two) times daily. 60 tablet 3   amLODipine (NORVASC) 5 MG tablet Take 5 mg by mouth daily.     atorvastatin (LIPITOR) 40 MG tablet Take 1 tablet (40 mg total) by mouth daily. Please make overdue appt with Dr. Johney Frame before anymore refills. Thank you 3rd and FINAL attempt. 15 tablet 0   diphenhydrAMINE HCl, Sleep, 50 MG CAPS Take 50 mg by mouth at bedtime.     FeFum-FePoly-FA-B Cmp-C-Biot (INTEGRA PLUS) CAPS Take 1 capsule by mouth daily. 30 capsule 3   losartan (COZAAR) 25 MG tablet Take 1 tablet (25 mg total) by mouth daily. Please make overdue appt with Dr. Johney Frame before anymore refills. Thank you 3rd and Final attempt 15 tablet 0   Naphazoline-Glycerin (CLEAR EYES REDNESS RELIEF OP) Place 1 drop into both eyes daily.     omeprazole (PRILOSEC) 20 MG capsule TAKE 1 CAPSULE BY MOUTH EVERY DAY 30 capsule 1   OVER THE COUNTER MEDICATION Apply 1 application topically daily as needed (pain). Hemp cream     predniSONE (DELTASONE) 20 MG tablet Please take 5 tablets (100 mg) by mouth in the morning for 1 week, then take 4 tablets (80 mg) in the morning for 1  week, followed by 3 tablets in the morning (60 mg) for 1 week, followed by 2 tablets (40 mg) in the morning for 1 week, followed by 1 tablet (20 mg) in the morning for 1 week, followed by 1/2 a tablet for 1 week then stop 109 tablet 0   prochlorperazine (COMPAZINE) 10 MG tablet Take 1 tablet (10 mg total) by mouth every 6 (six) hours as needed for nausea or vomiting. 30 tablet 0   umeclidinium bromide (INCRUSE ELLIPTA) 62.5 MCG/INH AEPB Inhale 1 puff into the lungs daily.  (Patient not taking: Reported on 09/06/2021) 60 each 5   venetoclax (VENCLEXTA) 10 & 50 & 100 MG Starter Pack Take 20 mg by mouth daily for 7 days, then 50 mg daily for 7 days, then 100 mg daily for 7 days, then 200 mg daily for 7 days. Take with food & water. 42 tablet 0   No current facility-administered medications for this visit.    SURGICAL HISTORY:  Past Surgical History:  Procedure Laterality Date   BRONCHIAL NEEDLE ASPIRATION BIOPSY  03/12/2020   Procedure: BRONCHIAL NEEDLE ASPIRATION BIOPSIES;  Surgeon: Garner Nash, DO;  Location: [facility];  Service: Pulmonary;;   CIRCUMCISION     COLONOSCOPY WITH PROPOFOL N/A 05/10/2015   Procedure: COLONOSCOPY WITH PROPOFOL;  Surgeon: Garlan Fair, MD;  Location: [facility];  Service: Endoscopy;  Laterality: N/A;   DECOMPRESSIVE LUMBAR LAMINECTOMY LEVEL 1 N/A 06/15/2018   Procedure: Gordy Levan decompression L1-L2/ complete inferior facetectomyof L1. Posterior lateral arthrodesiswith autograft bone L1-L2.;  Surgeon: Melina Schools, MD;  Location: [facility];  Service: Orthopedics;  Laterality: N/A;   HEMORRHOID SURGERY     VIDEO BRONCHOSCOPY WITH ENDOBRONCHIAL ULTRASOUND N/A 03/12/2020   Procedure: VIDEO BRONCHOSCOPY WITH ENDOBRONCHIAL ULTRASOUND;  Surgeon: Garner Nash, DO;  Location: [facility];  Service: Pulmonary;  Laterality: N/A;    REVIEW OF SYSTEMS:   Review of Systems  Constitutional: Positive for fatigue.  Negative for appetite change, chills, fever and unexpected weight change.  HENT:   Negative for mouth sores, nosebleeds, sore throat and trouble swallowing.   Eyes: Negative for eye problems and icterus.  Respiratory: Positive for dyspnea on exertion.  Negative for cough, hemoptysis, and wheezing.   Cardiovascular: Negative for chest pain and leg swelling.  Gastrointestinal: Negative for abdominal pain, constipation, diarrhea, nausea and vomiting.  Genitourinary: Negative for bladder incontinence, difficulty urinating, dysuria,  frequency and hematuria.   Musculoskeletal: Negative for back pain, gait problem, neck pain and neck stiffness.  Skin: Negative for itching and rash.  Neurological: Negative for dizziness, extremity weakness, gait problem, headaches, light-headedness and seizures.  Hematological: Negative for adenopathy. Does not bruise/bleed easily.  Psychiatric/Behavioral: Negative for confusion, depression and sleep disturbance. The patient is not nervous/anxious.     PHYSICAL EXAMINATION:  Blood pressure (!) 115/52, pulse (!) 106, temperature 98.2 F (36.8 C), temperature source Tympanic, resp. rate 18, height 5' 11.5" (1.816 m), weight 209 lb 1.6 oz (94.8 kg), SpO2 95 %.  ECOG PERFORMANCE STATUS: 1  Physical Exam  Constitutional: Oriented to person, place, and time and well-developed, well-nourished, and in no distress.  HENT:  Head: Normocephalic and atraumatic.  Mouth/Throat: Oropharynx is clear and moist. No oropharyngeal exudate.  Eyes: Conjunctivae are normal. Right eye exhibits no discharge. Left eye exhibits no discharge. No scleral icterus.  Neck: Normal range of motion. Neck supple.  Cardiovascular: Normal rate, regular rhythm, normal heart sounds and intact distal pulses.  Is wearing Zio patch.  Pulmonary/Chest: Effort normal  and breath sounds normal. No respiratory distress. No wheezes. No rales.  Abdominal: Soft. Bowel sounds are normal. Exhibits no distension and no mass. There is no tenderness.  Musculoskeletal: Normal range of motion. Exhibits no edema.  Lymphadenopathy:    No cervical adenopathy.  Neurological: Alert and oriented to person, place, and time. Exhibits normal muscle tone. Gait normal. Coordination normal.  Skin: Skin is warm and dry. No rash noted. Not diaphoretic. No erythema. No pallor.  Psychiatric: Mood, memory and judgment normal.  Vitals reviewed.  LABORATORY DATA: Lab Results  Component Value Date   WBC 3.8 (L) 09/07/2021   HGB 6.9 (LL) 09/07/2021   HCT  20.8 (L) 09/07/2021   MCV 95.9 09/07/2021   PLT 109 (L) 09/07/2021      Chemistry      Component Value Date/Time   NA 135 09/07/2021 0805   NA 136 09/05/2021 1041   K 4.1 09/07/2021 0805   CL 102 09/07/2021 0805   CO2 26 09/07/2021 0805   BUN 18 09/07/2021 0805   BUN 15 09/05/2021 1041   CREATININE 0.96 09/07/2021 0805      Component Value Date/Time   CALCIUM 8.6 (L) 09/07/2021 0805   ALKPHOS 79 09/07/2021 0805   AST 21 09/07/2021 0805   ALT 33 09/07/2021 0805   BILITOT 0.7 09/07/2021 0805       RADIOGRAPHIC STUDIES:  No results found.   ASSESSMENT/PLAN:   This is a very pleasant 81 years old African-American male diagnosed with a small lymphocytic lymphoma with generalized lymphadenopathy in the neck, chest, axilla, abdomen and pelvis. The core biopsy of the left inguinal lymph node was consistent with the above diagnosis.  Patient recently developed worsening anemia and thrombocytopenia.  His imaging studies showed mildly progressive lymphadenopathy in the right axillary, porta hepatis, retroperitoneum, and pelvis.  The patient received a tapering dose of prednisone without any improvement in his thrombocytopenia.  Dr. Julien Nordmann recommended starting treatment with  obinutuzumab and venetoclax for the duration of 1 year.  He will start the first dose of venetoclax on 09/28/21. We will try to make him a calendar with instructions how to take this medication.   He is here to start his first cycle of obinutuzmab today.   Labs were reviewed with Dr. Julien Nordmann. His Hbg is 6.9 today. We will arrange for 1 unit of blood and consent was received to proceed. Recommend that he proceed with cycle #1 today as scheduled.   We will monitor his labs closely.   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 Reviewed with Dr. Julien Nordmann. He is going to review and consider another pathologist review. He still recommends proceeding with treatment today as scheduled   The patient was  advised to call immediately if she has any concerning symptoms in the interval. The patient voices understanding of current disease status and treatment options and is in agreement with the current care plan. All questions were answered. The patient knows to call the clinic with any problems, questions or concerns. We can certainly see the patient much sooner if necessary      Orders Placed This Encounter  Procedures   CBC with Differential ([facility] Only)    Standing Status:   Standing    Number of Occurrences:   15    Standing Expiration Date:   09/08/2022   CMP ([facility] only)    Standing Status:   Standing    Number of Occurrences:   15    Standing Expiration  Date:   09/08/2022   Informed Consent Details: Physician/Practitioner Attestation; Transcribe to consent form and obtain patient signature    Standing Status:   Standing    Number of Occurrences:   1    Order Specific Question:   Physician/Practitioner attestation of informed consent for blood and or blood product transfusion    Answer:   I, the physician/practitioner, attest that I have discussed with the patient the benefits, risks, side effects, alternatives, likelihood of achieving goals and potential problems during recovery for the procedure that I have provided informed consent.    Order Specific Question:   Product(s)    Answer:   All Product(s)   Sample to Blood Bank    Standing Status:   Standing    Number of Occurrences:   10    Standing Expiration Date:   09/08/2022     The total time spent in the appointment was 30-39 minutes.   Riva Sesma L Natika Geyer, PA-C 09/07/21

## 2021-09-02 NOTE — Telephone Encounter (Signed)
R/s per 8/3 in basket, pt has been called and confirmed

## 2021-09-03 ENCOUNTER — Other Ambulatory Visit: Payer: Self-pay

## 2021-09-05 ENCOUNTER — Telehealth: Payer: Self-pay

## 2021-09-05 ENCOUNTER — Other Ambulatory Visit: Payer: Self-pay | Admitting: Physician Assistant

## 2021-09-05 ENCOUNTER — Other Ambulatory Visit (HOSPITAL_COMMUNITY): Payer: Self-pay

## 2021-09-05 ENCOUNTER — Ambulatory Visit: Payer: Medicare HMO | Admitting: Physician Assistant

## 2021-09-05 ENCOUNTER — Encounter: Payer: Self-pay | Admitting: Physician Assistant

## 2021-09-05 ENCOUNTER — Ambulatory Visit (INDEPENDENT_AMBULATORY_CARE_PROVIDER_SITE_OTHER): Payer: Medicare HMO

## 2021-09-05 VITALS — BP 124/66 | HR 112 | Ht 71.5 in | Wt 210.8 lb

## 2021-09-05 DIAGNOSIS — I493 Ventricular premature depolarization: Secondary | ICD-10-CM

## 2021-09-05 DIAGNOSIS — E785 Hyperlipidemia, unspecified: Secondary | ICD-10-CM | POA: Diagnosis not present

## 2021-09-05 DIAGNOSIS — R0602 Shortness of breath: Secondary | ICD-10-CM

## 2021-09-05 DIAGNOSIS — I779 Disorder of arteries and arterioles, unspecified: Secondary | ICD-10-CM

## 2021-09-05 DIAGNOSIS — I2510 Atherosclerotic heart disease of native coronary artery without angina pectoris: Secondary | ICD-10-CM | POA: Diagnosis not present

## 2021-09-05 DIAGNOSIS — R000 Tachycardia, unspecified: Secondary | ICD-10-CM | POA: Diagnosis not present

## 2021-09-05 DIAGNOSIS — C8300 Small cell B-cell lymphoma, unspecified site: Secondary | ICD-10-CM

## 2021-09-05 DIAGNOSIS — I10 Essential (primary) hypertension: Secondary | ICD-10-CM

## 2021-09-05 LAB — LIPID PANEL
Chol/HDL Ratio: 3.5 ratio (ref 0.0–5.0)
Cholesterol, Total: 192 mg/dL (ref 100–199)
HDL: 55 mg/dL (ref >39)
LDL Chol Calc (NIH): 121 mg/dL — ABNORMAL HIGH (ref 0–99)
Triglycerides: 90 mg/dL (ref 0–149)
VLDL Cholesterol Cal: 16 mg/dL (ref 5–40)

## 2021-09-05 LAB — BASIC METABOLIC PANEL
BUN/Creatinine Ratio: 15 (ref 10–24)
BUN: 15 mg/dL (ref 8–27)
CO2: 26 mmol/L (ref 20–29)
Calcium: 9 mg/dL (ref 8.6–10.2)
Chloride: 100 mmol/L (ref 96–106)
Creatinine, Ser: 1 mg/dL (ref 0.76–1.27)
Glucose: 121 mg/dL — ABNORMAL HIGH (ref 70–99)
Potassium: 4.7 mmol/L (ref 3.5–5.2)
Sodium: 136 mmol/L (ref 134–144)
eGFR: 77 mL/min/{1.73_m2} (ref >59)

## 2021-09-05 LAB — CBC
Hematocrit: 22.8 % — ABNORMAL LOW (ref 37.5–51.0)
Hemoglobin: 7.6 g/dL — ABNORMAL LOW (ref 13.0–17.7)
MCH: 31.1 pg (ref 26.6–33.0)
MCHC: 33.3 g/dL (ref 31.5–35.7)
MCV: 93 fL (ref 79–97)
Platelets: 97 10*3/uL — CL (ref 150–450)
RBC: 2.44 x10E6/uL — CL (ref 4.14–5.80)
RDW: 21.8 % — ABNORMAL HIGH (ref 11.6–15.4)
WBC: 3.8 10*3/uL (ref 3.4–10.8)

## 2021-09-05 LAB — HEPATIC FUNCTION PANEL
ALT: 36 IU/L (ref 0–44)
AST: 15 IU/L (ref 0–40)
Albumin: 3.8 g/dL (ref 3.8–4.8)
Alkaline Phosphatase: 98 IU/L (ref 44–121)
Bilirubin Total: 0.8 mg/dL (ref 0.0–1.2)
Bilirubin, Direct: 0.25 mg/dL (ref 0.00–0.40)
Total Protein: 6.9 g/dL (ref 6.0–8.5)

## 2021-09-05 LAB — MAGNESIUM: Magnesium: 2.1 mg/dL (ref 1.6–2.3)

## 2021-09-05 LAB — TSH: TSH: 1.76 u[IU]/mL (ref 0.450–4.500)

## 2021-09-05 NOTE — Progress Notes (Unsigned)
ZIO XT serial # [id number] from office inventory applied to patient.  Dr. Johney Frame to read.

## 2021-09-05 NOTE — Telephone Encounter (Signed)
Left message for the patient to contact the office to discuss meds and correct dosages.

## 2021-09-05 NOTE — Telephone Encounter (Signed)
Oral Chemotherapy Pharmacist Encounter  I spoke with patient for overview of: Venclexta (venetoclax) for the treatment of small lymphoctyic lymphoma in conjunction with obinutuzumab, planned duration ~1 year.  Counseled patient on administration, dosing, side effects, monitoring, drug-food interactions, safe handling, storage, and disposal.  Venclexta dose will be titrated up per manufacturer recommendations.  Patient will take Venclexta '10mg'$  tablets, 2 tablets (20 mg) by mouth once daily with food and water for 7 days. The 1st dose of Venclexta '20mg'$  will be administered outpatient based on tumor lysis syndrome (TLS) risk assessment to be low risk. Baseline labs will be assessed and the 1st dose will be administered. CBC, uric acid, and electrolytes will be monitored at 6-8 hours post dose administration per protocol.  Patient will take Venclexta '50mg'$  tablets, 1 tablet by mouth once daily with food and water for 7 days. The 1st dose of Venclexta '50mg'$  tablets will be administered outpatient based on TLS risk assessment. Baseline labs will be assessed and the 1st dose will be administered. CBC, uric acid, and electrolytes will be monitored at 6-8 hours post dose administration per protocol.  Subsequent ramp-up doses of '100mg'$  daily x 7 days, '200mg'$  daily x 7 days, and '400mg'$  daily (target maintenance dose) will be monitored per MD.  Lynita Lombard start date: 09/07/21  Adverse effects include but are not limited to: TLS, decreased blood counts, electrolyte abnormalities, diarrhea, nausea, fatigue, arthralgias/myalgias, and upper respiratory tract infection.   Patient was instructed to start allopurinol '300mg'$  every 12 hours. He has not picked this up yet, and was instructed that he will need to [facility].  Patient reminded to pick up his anti-emetic from [facility] to have on hand and knows to take it if nausea develops.   Patient will obtain anti diarrheal and alert the office  of 4 or more loose stools above baseline. Patient understands importance of staying hydrated while on Venclexta to decrease TLS risk.  Reviewed with patient importance of keeping a medication schedule and plan for any missed doses. No barriers to medication adherence identified.  Medication reconciliation performed and medication/allergy list updated.  Insurance authorization for Lynita Lombard has been obtained. Patient will pick this up from the [facility] on 09/06/21.  Patient informed the pharmacy will reach out 5-7 days prior to needing next fill of Venclexta to coordinate continued medication acquisition to prevent break in therapy.  All questions answered.  Mr. Aja voiced understanding and appreciation.   Medication education handout and medication calendar will be given to patient at chemo education appt on 09/06/21. Patient knows to call the office with questions or concerns. Oral Chemotherapy Clinic phone number provided to patient.   Leron Croak, PharmD, BCPS, BCOP Hematology/Oncology Clinical Pharmacist [facility] [phone] 09/05/2021 2:21 PM

## 2021-09-05 NOTE — Patient Instructions (Addendum)
Medication Instructions:  DISCONTINUE Aspirin  *If you need a refill on your cardiac medications before your next appointment, please call your pharmacy*   Lab Work: TODAY-STAT BMET, STAT CBC, MAG, LFT, LIPID, TSH  If you have labs (blood work) drawn today and your tests are completely normal, you will receive your results only by: [facility] (if you have MyChart) OR A paper copy in the mail If you have any lab test that is abnormal or we need to change your treatment, we will call you to review the results.   Testing/Procedures: Your physician has requested that you have an echocardiogram. Echocardiography is a painless test that uses sound waves to create images of your heart. It provides your doctor with information about the size and shape of your heart and how well your heart's chambers and valves are working. This procedure takes approximately one hour. There are no restrictions for this procedure.  A chest x-ray takes a picture of the organs and structures inside the chest, including the heart, lungs, and blood vessels. This test can show several things, including, whether the heart is enlarges; whether fluid is building up in the lungs; and whether pacemaker / defibrillator leads are still in place. THIS CAN BE COMPLETED AT ONE OF TWO OFFICES [place] [facility]; BOTH ARE WALK IN OFFICES AND DO NOT REQUIRE AN APPOINTMENT. YOU MUST ARRIVE BEFORE 5PM TO HAVE TEST COMPLETED.  ZIO XT- Long Term Monitor Instructions-THIS MONITOR WILL BE MAILED TO YOU   Your physician has requested you wear a ZIO patch monitor for 14 days.  This is a single patch monitor. [employer] supplies one patch monitor per enrollment. Additional stickers are not available. Please do not apply patch if you will be having a Nuclear Stress Test,  Echocardiogram, Cardiac CT, MRI, or Chest Xray during the period you would be wearing the  monitor. The patch cannot be worn during these  tests. You cannot remove and re-apply the  ZIO XT patch monitor.  Your ZIO patch monitor will be mailed 3 day USPS to your address on file. It may take 3-5 days  to receive your monitor after you have been enrolled.  Once you have received your monitor, please review the enclosed instructions. Your monitor  has already been registered assigning a specific monitor serial # to you.  Billing and Patient Assistance Program Information  We have supplied [employer] with any of your insurance information on file for billing purposes. [employer] offers a sliding scale Patient Assistance Program for patients that do not have  insurance, or whose insurance does not completely cover the cost of the ZIO monitor.  You must apply for the Patient Assistance Program to qualify for this discounted rate.  To apply, please call [employer] at [phone], select option 4, select option 2, ask to apply for  Patient Assistance Program. Theodore Demark will ask your household income, and how many people  are in your household. They will quote your out-of-pocket cost based on that information.  [employer] will also be able to set up a 67-month interest-free payment plan if needed.  Applying the monitor   Shave hair from upper left chest.  Hold abrader disc by orange tab. Rub abrader in 40 strokes over the upper left chest as  indicated in your monitor instructions.  Clean area with 4 enclosed alcohol pads. Let dry.  Apply patch as indicated in monitor instructions. Patch will be placed under collarbone on left  side  of chest with arrow pointing upward.  Rub patch adhesive wings for 2 minutes. Remove white label marked "1". Remove the white  label marked "2". Rub patch adhesive wings for 2 additional minutes.  While looking in a mirror, press and release button in center of patch. A small green light will  flash 3-4 times. This will be your only indicator that the monitor has been turned on.  Do not shower for the first 24  hours. You may shower after the first 24 hours.  Press the button if you feel a symptom. You will hear a small click. Record Date, Time and  Symptom in the Patient Logbook.  When you are ready to remove the patch, follow instructions on the last 2 pages of Patient  Logbook. Stick patch monitor onto the last page of Patient Logbook.  Place Patient Logbook in the blue and white box. Use locking tab on box and tape box closed  securely. The blue and white box has prepaid postage on it. Please place it in the mailbox as  soon as possible. Your physician should have your test results approximately 7 days after the  monitor has been mailed back to [facility].  Call [facility] at [phone] if you have questions regarding  your ZIO XT patch monitor. Call them immediately if you see an orange light blinking on your  monitor.  If your monitor falls off in less than 4 days, contact our Monitor department at [phone].  If your monitor becomes loose or falls off after 4 days call [employer] at [phone] for  suggestions on securing your monitor   Follow-Up: At [facility], you and your health needs are our priority.  As part of our continuing mission to provide you with exceptional heart care, we have created designated Provider Care Teams.  These Care Teams include your primary Cardiologist (physician) and Advanced Practice Providers (APPs -  Physician Assistants and Nurse Practitioners) who all work together to provide you with the care you need, when you need it.  We recommend signing up for the patient portal called "MyChart".  Sign up information is provided on this After Visit Summary.  MyChart is used to connect with patients for Virtual Visits (Telemedicine).  Patients are able to view lab/test results, encounter notes, upcoming appointments, etc.  Non-urgent messages can be sent to your provider as well.   To learn more about what you can do with MyChart, go to  [email or web address].    Your next appointment:   2 week(s)  The format for your next appointment:   In Person  Provider:   Melina Copa, PA-C       Other Instructions   Important Information About [employer]

## 2021-09-05 NOTE — Progress Notes (Signed)
Cardiology Office Note    Date:  09/05/2021   ID:  Joseph Moyer, DOB [birth year], MRN [id number]  PCP:  [name], MD  Cardiologist:  Freada Bergeron, MD  Electrophysiologist:  None   Chief Complaint: f/u CAD  History of Present Illness:   Joseph Moyer is a 81 y.o. 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 He was previously seen for evaluation of shortness of breath with coronary CTA showing findings below with mild-moderate nonobstructive CAD with negative FFR. The study also incidentally picked up extensive lymphadenopathy and he was subsequently found to have small lymphocytic lymphoma. 2D echo 12/2019 showed EF 65-70%, no significant valvular disease or diastolic dysfunction. 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 Per 8/2 note, the plan is to start obinutuzumab and venetoclax. He has not yet started these per his report. Most recent Hgb 8/4 was down to 7.4 (previous baseline 11-13).   He is seen for follow-up reporting chronic exertional dyspnea. He notes the example of decreased stamina when mowing his lawn. He denies any acute change since initially evaluated for this problem in 2021. He denies any chest pain, edema, orthopnea, PND, dizziness or syncope. His pulse is noted to be mildly tachycardic with occasional PVCs and he denies any acute awareness of this. He reports occasional BRBPR on every 3rd bowel movement for the last year that occurs primarily with straining. He has not notified anyone of this. He had a colonoscopy in 2017 with polyp removed and is unsure when he was supposed to have this repeated. He otherwise is hemodynamically stable  and well appearing today.  When med list was entered, he told CMA that he was taking 4 amlodipine tablets daily - states he saw another practitioner who said that '10mg'$  of amlodipine was noting and bumped it up to 4 tablets daily stating that 1 tablet was nothing. He is not sure if perhaps maybe he was thinking of the losartan instead. He will clarify his med list when he gets home and call us with that information.  Labwork independently reviewed: 08/2021 Hgb 7.4, plt 70, K 3.9, Cr 1.15, AST OK, ALT 48 03/2020 LDL 64   Cardiology Studies:   Studies reviewed are outlined and summarized above. Reports included below if pertinent.   Cor CT 01/2020 IMPRESSION: 1. Coronary calcium score of 257. This was 67th percentile for age and sex matched control.   2.  Normal coronary origin with right dominance.   3. Moderate plaque in the mid LAD, mild plaque in the proximal LAD and D1. CAD RADS 3.   4.  Mild calcification of the aorta.   5.  Mild calcification of the aortic valve.   6. Will send study for FFRct to assess for obstructive disease in the LAD.   Skeet Latch, MD     Electronically Signed   By: Skeet Latch   On: 02/10/2020 19:12  FINDINGS: FFRct analysis was performed on the original cardiac CT angiogram dataset. Diagrammatic representation of the FFRct analysis is provided in a separate PDF document in PACS. This dictation was created using the PDF document and an interactive 3D model of the results. 3D model is not available in the EMR/PACS. Normal FFR range is >0.80.  1. Left Main: FFRct 0.98   2. LAD: FFRct 0.97 proximal, 0.91 mid, 0.82 distal.  D1 FFRct 0.95.   3. LCX: FFRct 0.98 proximal, 0.98 mid.  OM1 FFRct 0.98.   4. RCA: FFRct 0.98 proximal, 0.94 mid, 0.85 distal.   IMPRESSION: 1. FFRct findings consistent with non-obstructive coronary artery disease.   2.  Recommend aggressive risk factor modification.   Tiffany C. Oval Linsey, MD      Electronically Signed   By: Skeet Latch   On: 02/11/2020 16:45    IMPRESSION: 1. Evidence for mediastinal and hilar lymphadenopathy. Findings are nonspecific and mediastinum is incompletely imaged. Recommend further evaluation with a chest CT with IV contrast. 2. Small hiatal hernia. 3.  Aortic Atherosclerosis (ICD10-I70.0).   These results will be called to the ordering clinician or representative by the Radiologist Assistant, and communication documented in the PACS or [employer].   Electronically Signed: By: Markus Daft M.D. On: 02/10/2020 16:30  2d Echo 12/2019  1. Left ventricular ejection fraction, by estimation, is 65 to 70%. The  left ventricle has normal function. The left ventricle has no regional  wall motion abnormalities. Left ventricular diastolic parameters were  normal.   2. Right ventricular systolic function is normal. The right ventricular  size is normal. Tricuspid regurgitation signal is inadequate for assessing  PA pressure.   3. The mitral valve is grossly normal. No evidence of mitral valve  regurgitation. No evidence of mitral stenosis.   4. The aortic valve is tricuspid. There is mild calcification of the  aortic valve. Aortic valve regurgitation is not visualized. No aortic  stenosis is present.   5. The inferior vena cava is normal in size with greater than 50%  respiratory variability, suggesting right atrial pressure of 3 mmHg.   Conclusion(s)/Recommendation(s): Normal biventricular function without  evidence of hemodynamically significant valvular heart disease.     Past Medical History:  Diagnosis Date   Allergy    Arthritis    CAD in native artery    COPD (chronic obstructive pulmonary disease) (HCC)    GERD (gastroesophageal reflux disease)    Hyperlipidemia    Hypertension    Mild carotid artery disease (HCC)    Small lymphocytic lymphoma ([place])     Past Surgical History:  Procedure Laterality Date   BRONCHIAL  NEEDLE ASPIRATION BIOPSY  03/12/2020   Procedure: BRONCHIAL NEEDLE ASPIRATION BIOPSIES;  Surgeon: Garner Nash, DO;  Location: [facility];  Service: Pulmonary;;   CIRCUMCISION     COLONOSCOPY WITH PROPOFOL N/A 05/10/2015   Procedure: COLONOSCOPY WITH PROPOFOL;  Surgeon: Garlan Fair, MD;  Location: [facility];  Service: Endoscopy;  Laterality: N/A;   DECOMPRESSIVE LUMBAR LAMINECTOMY LEVEL 1 N/A 06/15/2018   Procedure: Gordy Levan decompression L1-L2/ complete inferior facetectomyof L1. Posterior lateral arthrodesiswith autograft bone L1-L2.;  Surgeon: Melina Schools, MD;  Location: [facility];  Service: Orthopedics;  Laterality: N/A;   HEMORRHOID SURGERY     VIDEO BRONCHOSCOPY WITH ENDOBRONCHIAL ULTRASOUND N/A 03/12/2020   Procedure: VIDEO BRONCHOSCOPY WITH ENDOBRONCHIAL ULTRASOUND;  Surgeon: Garner Nash, DO;  Location: [place];  Service: Pulmonary;  Laterality: N/A;    Current Medications: Current Meds  Medication Sig   allopurinol (ZYLOPRIM) 100 MG tablet Take 3 tablets (300 mg total) by mouth 2 (two) times daily.   amLODipine (NORVASC) 10 MG tablet Take 1 tablet (10 mg total) by mouth daily. Please make overdue appt with Dr. Johney Frame before anymore refills. Thank you 1st attempt (Patient taking differently: Take  40 mg by mouth daily. Please make overdue appt with Dr. Johney Frame before anymore refills. Thank you 1st attempt)   aspirin 81 MG tablet Take 81 mg by mouth every morning.    atorvastatin (LIPITOR) 40 MG tablet Take 1 tablet (40 mg total) by mouth daily. Please make overdue appt with Dr. Johney Frame before anymore refills. Thank you 3rd and FINAL attempt.   diphenhydrAMINE HCl, Sleep, 50 MG CAPS Take 50 mg by mouth at bedtime.   FeFum-FePoly-FA-B Cmp-C-Biot (INTEGRA PLUS) CAPS Take 1 capsule by mouth daily.   losartan (COZAAR) 25 MG tablet Take 1 tablet (25 mg total) by mouth daily. Please make overdue appt with Dr. Johney Frame before anymore refills. Thank you 3rd and Final attempt    Magnesium ([employer] TRIPLE MAGNESIUM COMPLEX) 400 MG CAPS Take 400 mg by mouth daily.   Naphazoline-Glycerin (CLEAR EYES REDNESS RELIEF OP) Place 1 drop into both eyes daily.   omeprazole (PRILOSEC) 20 MG capsule TAKE 1 CAPSULE BY MOUTH EVERY DAY   OVER THE COUNTER MEDICATION Apply 1 application topically daily as needed (pain). Hemp cream   predniSONE (DELTASONE) 20 MG tablet Please take 5 tablets (100 mg) by mouth in the morning for 1 week, then take 4 tablets (80 mg) in the morning for 1 week, followed by 3 tablets in the morning (60 mg) for 1 week, followed by 2 tablets (40 mg) in the morning for 1 week, followed by 1 tablet (20 mg) in the morning for 1 week, followed by 1/2 a tablet for 1 week then stop   prochlorperazine (COMPAZINE) 10 MG tablet Take 1 tablet (10 mg total) by mouth every 6 (six) hours as needed for nausea or vomiting.   umeclidinium bromide (INCRUSE ELLIPTA) 62.5 MCG/INH AEPB Inhale 1 puff into the lungs daily.   venetoclax (VENCLEXTA) 10 & 50 & 100 MG Starter Pack Take 20 mg by mouth daily for 7 days, then increase to 50 mg daily for 7 days, then increase to 100 mg daily for 7 days, then increase to 200 mg daily for 7 days. Take with food & water.      Allergies:   Oxycodone, Ramipril, and Rosuvastatin   Social History   Socioeconomic History   Marital status: Married    Spouse name: Not on file   Number of children: Not on file   Years of education: Not on file   Highest education level: Not on file  Occupational History   Not on file  Tobacco Use   Smoking status: Former    Types: Cigarettes    Quit date: 40    Years since quitting: 29.6   Smokeless tobacco: Never  Vaping Use   Vaping Use: Never used  Substance and Sexual Activity   Alcohol use: Yes    Alcohol/week: 0.0 standard drinks of alcohol    Comment: occassionally   Drug use: No   Sexual activity: Not on file  Other Topics Concern   Not on file  Social History Narrative   Not on file    Social Determinants of Health   Financial Resource Strain: Not on file  Food Insecurity: Not on file  Transportation Needs: Not on file  Physical Activity: Not on file  Stress: Not on file  Social Connections: Socially Integrated (06/17/2018)   Social Connection and Isolation Panel [NHANES]    Frequency of Communication with Friends and Family: Three times a week    Frequency of Social Gatherings with Friends and Family: Three times a week  Attends Religious Services: 1 to 4 times per year    Active Member of Clubs or Organizations: Yes    Attends Archivist Meetings: Never    Marital Status: Married     Family History:  The patient's family history includes Diabetes in his brother, mother, and sister.  ROS:   Please see the history of present illness.  All other systems are reviewed and otherwise negative.    EKG(s)/Additional Labs   EKG:  EKG is ordered today, personally reviewed, demonstrating sinus tachycardia 112bpm, frequent PVCs, possible LAE, no acute STT changes, QTC 416m  Recent Labs: 08/31/2021: ALT 48; BUN 25; Creatinine 1.15; Hemoglobin 7.4; Platelet Count 70; Potassium 3.9; Sodium 136  Recent Lipid Panel    Component Value Date/Time   CHOL 139 04/05/2020 1502   TRIG 134 04/05/2020 1502   HDL 52 04/05/2020 1502   CHOLHDL 2.7 04/05/2020 1502   LDLCALC 64 04/05/2020 1502    PHYSICAL EXAM:    VS:  BP 124/66   Pulse (!) 112   Ht 5' 11.5" (1.816 m)   Wt 210 lb 12.8 oz (95.6 kg)   SpO2 94%   BMI 28.99 kg/m   BMI: Body mass index is 28.99 kg/m.  GEN: Well nourished, well developed male in no acute distress HEENT: normocephalic, atraumatic Neck: no JVD, carotid bruits, or masses Cardiac: regular, tachycardic, frequent PVCs, soft SEM, no rubs or gallops, no edema  Respiratory:  clear to auscultation bilaterally, normal work of breathing GI: soft, nontender, nondistended, + BS MS: no deformity or atrophy Skin: warm and dry, no rash Neuro:   Alert and Oriented x 3, Strength and sensation are intact, follows commands Psych: euthymic mood, full affect  Wt Readings from Last 3 Encounters:  09/05/21 210 lb 12.8 oz (95.6 kg)  08/31/21 216 lb 7 oz (98.2 kg)  08/17/21 224 lb 3 oz (101.7 kg)     ASSESSMENT & PLAN:   1. Shortness of breath - reports chronic exertional dyspnea, denies acute change from prior evaluation, but still interfering with higher exertional activities like mowing lawn. Prior cardiac evaluation showed mild-moderate CAD but negative FFR suggesting that lesions were not hemodynamically significant. His prior echocardiogram also did not show any evidence of ventricular dysfunction, diastolic dysfunction or valvular disease. I suspect his symptoms are due in part to his lymphoma and worsening anemia. I am somewhat concerned today that his pulse is higher than it has been normally. He has no s/sx of VTE or tamponade on exam. He has no chest pain, swelling, pleuritic symptoms, JVD, or hypotension. I am concerned his Hgb could be lower. Will check stat CBC and BMET today. Will also be getting additional labs for problems below as well as a f/u CXR and 2D echo to ensure no acute changes.  2. Sinus tachycardia, frequent PVCs - as outlined above, check labs to include CBC + BMET (stat), as well as TSH and Mg. HR came down to around 107 seated. He does not report any acute symptoms with this, just continued chronic exertional dyspnea. Will arrange 3 day Zio to quantify PVC burden and update echocardiogram (requesting expedited). Hold off rx in case tachycardia is compensatory. He also does note some BRBPR that has been going on for the last year. I have asked him to call his GI doctor today to discuss evaluation. Previously was following with [facility].  3. Nonobstructive CAD, HLD - continued medical therapy is appropriate at this time, though I am concerned about  the risk of continuing aspirin versus potential benefit given his ongoing  issues with anemia/thrombocytopenia and BRBPR. He has no history of MI or PCI. Will stop per discussion with patient. Will also reach out to Dr. Johney Frame to make her aware of this as well. Continue statin. Check LFTs/lipids today (has not eaten yet).  4. Mild carotid artery disease - no recent stroke symptoms. In light of multiple other evolving medical problems presently as well as low Hgb/plt, will defer repeat screening and observe for TIA/CVA symptoms.  5. Essential HTN - patient is unsure what he is taking as far as BP meds above - see HPI - really hope he is not taking '40mg'$  of amlodipine a day. He will double check his bottles at home and let us know what he is taking so we can appropriately refill if needed.    Disposition: F/u with me in 2 weeks. Warning sx/ED precautions reviewed with patient.   Medication Adjustments/Labs and Tests Ordered: Current medicines are reviewed at length with the patient today.  Concerns regarding medicines are outlined above. Medication changes, Labs and Tests ordered today are summarized above and listed in the Patient Instructions accessible in Encounters.   Signed, Charlie Pitter, PA-C  09/05/2021 9:48 AM    [place] Phone: [phone]; Fax: [phone]

## 2021-09-06 ENCOUNTER — Ambulatory Visit: Payer: Medicare HMO

## 2021-09-06 ENCOUNTER — Other Ambulatory Visit (HOSPITAL_COMMUNITY): Payer: Self-pay

## 2021-09-06 ENCOUNTER — Other Ambulatory Visit: Payer: Self-pay

## 2021-09-06 ENCOUNTER — Inpatient Hospital Stay: Payer: Medicare HMO

## 2021-09-06 MED FILL — Dexamethasone Sodium Phosphate Inj 100 MG/10ML: INTRAMUSCULAR | Qty: 2 | Status: AC

## 2021-09-06 NOTE — Telephone Encounter (Signed)
Left message for patient advising him to contact our office or the pharmacy if any of his cardiac meds need refills.

## 2021-09-06 NOTE — Telephone Encounter (Signed)
Spoke with the patient who states he has the following medications at home  Norvasc '5mg'$  QD Losartan '25mg'$  QD Robaxin '750mg'$  QD Mobic '15mg'$  QD Atovastatin '40mg'$  QD Foloivane plus QD prochlorperazine '10mg'$  q6 hours Allopuriol '100mg'$  Take 3 tabs bid Omeprazole '20mg'$  QD Flomax 0.'4mg'$  QD  He states he is on too much medication and would like to have his medication decreased.

## 2021-09-07 ENCOUNTER — Other Ambulatory Visit: Payer: Self-pay

## 2021-09-07 ENCOUNTER — Inpatient Hospital Stay: Payer: Medicare HMO

## 2021-09-07 ENCOUNTER — Inpatient Hospital Stay: Payer: Medicare HMO | Admitting: Physician Assistant

## 2021-09-07 ENCOUNTER — Other Ambulatory Visit (HOSPITAL_COMMUNITY): Payer: Self-pay

## 2021-09-07 ENCOUNTER — Other Ambulatory Visit: Payer: Self-pay | Admitting: Physician Assistant

## 2021-09-07 ENCOUNTER — Inpatient Hospital Stay (HOSPITAL_BASED_OUTPATIENT_CLINIC_OR_DEPARTMENT_OTHER): Payer: Medicare HMO | Admitting: Physician Assistant

## 2021-09-07 VITALS — BP 137/72 | HR 108 | Resp 20

## 2021-09-07 VITALS — BP 115/52 | HR 106 | Temp 98.2°F | Resp 18 | Ht 71.5 in | Wt 209.1 lb

## 2021-09-07 DIAGNOSIS — C8308 Small cell B-cell lymphoma, lymph nodes of multiple sites: Secondary | ICD-10-CM | POA: Diagnosis not present

## 2021-09-07 DIAGNOSIS — T451X5A Adverse effect of antineoplastic and immunosuppressive drugs, initial encounter: Secondary | ICD-10-CM | POA: Diagnosis not present

## 2021-09-07 DIAGNOSIS — Z5111 Encounter for antineoplastic chemotherapy: Secondary | ICD-10-CM | POA: Diagnosis not present

## 2021-09-07 DIAGNOSIS — Z79899 Other long term (current) drug therapy: Secondary | ICD-10-CM | POA: Diagnosis not present

## 2021-09-07 DIAGNOSIS — D649 Anemia, unspecified: Secondary | ICD-10-CM | POA: Diagnosis not present

## 2021-09-07 DIAGNOSIS — C8300 Small cell B-cell lymphoma, unspecified site: Secondary | ICD-10-CM

## 2021-09-07 DIAGNOSIS — I878 Other specified disorders of veins: Secondary | ICD-10-CM | POA: Diagnosis not present

## 2021-09-07 DIAGNOSIS — D696 Thrombocytopenia, unspecified: Secondary | ICD-10-CM | POA: Diagnosis not present

## 2021-09-07 DIAGNOSIS — D701 Agranulocytosis secondary to cancer chemotherapy: Secondary | ICD-10-CM | POA: Diagnosis not present

## 2021-09-07 DIAGNOSIS — D630 Anemia in neoplastic disease: Secondary | ICD-10-CM | POA: Diagnosis not present

## 2021-09-07 DIAGNOSIS — Z5112 Encounter for antineoplastic immunotherapy: Secondary | ICD-10-CM | POA: Diagnosis not present

## 2021-09-07 LAB — MAGNESIUM: Magnesium: 2 mg/dL (ref 1.7–2.4)

## 2021-09-07 LAB — CBC WITH DIFFERENTIAL (CANCER CENTER ONLY)
Abs Immature Granulocytes: 0 10*3/uL (ref 0.00–0.07)
Basophils Absolute: 0 10*3/uL (ref 0.0–0.1)
Basophils Relative: 0 %
Eosinophils Absolute: 0 10*3/uL (ref 0.0–0.5)
Eosinophils Relative: 0 %
HCT: 20.8 % — ABNORMAL LOW (ref 39.0–52.0)
Hemoglobin: 6.9 g/dL — CL (ref 13.0–17.0)
Lymphocytes Relative: 57 %
Lymphs Abs: 2.2 10*3/uL (ref 0.7–4.0)
MCH: 31.8 pg (ref 26.0–34.0)
MCHC: 33.2 g/dL (ref 30.0–36.0)
MCV: 95.9 fL (ref 80.0–100.0)
Monocytes Absolute: 0 10*3/uL — ABNORMAL LOW (ref 0.1–1.0)
Monocytes Relative: 1 %
Neutro Abs: 1.4 10*3/uL — ABNORMAL LOW (ref 1.7–7.7)
Neutrophils Relative %: 38 %
Other: 4 %
Platelet Count: 109 10*3/uL — ABNORMAL LOW (ref 150–400)
RBC: 2.17 MIL/uL — ABNORMAL LOW (ref 4.22–5.81)
RDW: 19.9 % — ABNORMAL HIGH (ref 11.5–15.5)
WBC Count: 3.8 10*3/uL — ABNORMAL LOW (ref 4.0–10.5)
nRBC: 0.5 % — ABNORMAL HIGH (ref 0.0–0.2)

## 2021-09-07 LAB — CMP (CANCER CENTER ONLY)
ALT: 33 U/L (ref 0–44)
AST: 21 U/L (ref 15–41)
Albumin: 3.7 g/dL (ref 3.5–5.0)
Alkaline Phosphatase: 79 U/L (ref 38–126)
Anion gap: 7 (ref 5–15)
BUN: 18 mg/dL (ref 8–23)
CO2: 26 mmol/L (ref 22–32)
Calcium: 8.6 mg/dL — ABNORMAL LOW (ref 8.9–10.3)
Chloride: 102 mmol/L (ref 98–111)
Creatinine: 0.96 mg/dL (ref 0.61–1.24)
GFR, Estimated: >60 mL/min (ref >60)
Glucose, Bld: 113 mg/dL — ABNORMAL HIGH (ref 70–99)
Potassium: 4.1 mmol/L (ref 3.5–5.1)
Sodium: 135 mmol/L (ref 135–145)
Total Bilirubin: 0.7 mg/dL (ref 0.3–1.2)
Total Protein: 7.8 g/dL (ref 6.5–8.1)

## 2021-09-07 LAB — PREPARE RBC (CROSSMATCH)

## 2021-09-07 LAB — PATHOLOGIST SMEAR REVIEW

## 2021-09-07 LAB — URIC ACID: Uric Acid, Serum: 5.4 mg/dL (ref 3.7–8.6)

## 2021-09-07 LAB — SAMPLE TO BLOOD BANK

## 2021-09-07 LAB — PHOSPHORUS: Phosphorus: 3.4 mg/dL (ref 2.5–4.6)

## 2021-09-07 MED ORDER — DIPHENHYDRAMINE HCL 50 MG/ML IJ SOLN: 50.0000 mg | Freq: Once | INTRAMUSCULAR | Status: DC | PRN

## 2021-09-07 MED ORDER — ALBUTEROL SULFATE (2.5 MG/3ML) 0.083% IN NEBU
2.5000 mg | INHALATION_SOLUTION | Freq: Once | RESPIRATORY_TRACT | Status: AC | PRN
  Administered 2021-09-07: 2.5 mg via RESPIRATORY_TRACT

## 2021-09-07 MED ORDER — FAMOTIDINE IN NACL 20-0.9 MG/50ML-% IV SOLN
20.0000 mg | Freq: Once | INTRAVENOUS | Status: AC | PRN
  Administered 2021-09-07: 20 mg via INTRAVENOUS

## 2021-09-07 MED ORDER — ACETAMINOPHEN 325 MG PO TABS
650.0000 mg | ORAL_TABLET | Freq: Once | ORAL | Status: AC
  Administered 2021-09-07: 650 mg via ORAL

## 2021-09-07 MED ORDER — DIPHENHYDRAMINE HCL 50 MG/ML IJ SOLN
50.0000 mg | Freq: Once | INTRAMUSCULAR | Status: AC
  Administered 2021-09-07: 50 mg via INTRAVENOUS

## 2021-09-07 MED ORDER — SODIUM CHLORIDE 0.9 % IV SOLN
100.0000 mg | Freq: Once | INTRAVENOUS | Status: AC
  Administered 2021-09-07: 100 mg via INTRAVENOUS
  Filled 2021-09-07: qty 4

## 2021-09-07 MED ORDER — SODIUM CHLORIDE 0.9 % IV SOLN
20.0000 mg | Freq: Once | INTRAVENOUS | Status: AC
  Administered 2021-09-07: 20 mg via INTRAVENOUS
  Filled 2021-09-07: qty 20

## 2021-09-07 MED ORDER — SODIUM CHLORIDE 0.9 % IV SOLN: Freq: Once | INTRAVENOUS | Status: DC | PRN

## 2021-09-07 MED ORDER — SODIUM CHLORIDE 0.9 % IV SOLN: Freq: Once | INTRAVENOUS | Status: AC

## 2021-09-07 MED ORDER — METHYLPREDNISOLONE SODIUM SUCC 125 MG IJ SOLR
125.0000 mg | Freq: Once | INTRAMUSCULAR | Status: AC | PRN
  Administered 2021-09-07: 125 mg via INTRAVENOUS

## 2021-09-07 MED ORDER — MORPHINE SULFATE (PF) 2 MG/ML IV SOLN
1.0000 mg | Freq: Once | INTRAVENOUS | Status: AC
  Administered 2021-09-07: 1 mg via INTRAVENOUS

## 2021-09-07 MED FILL — Dexamethasone Sodium Phosphate Inj 100 MG/10ML: INTRAMUSCULAR | Qty: 2 | Status: AC

## 2021-09-07 NOTE — Progress Notes (Signed)
DATE:  09/07/21                                        X CHEMO/IMMUNOTHERAPY REACTION             MD: Julien Nordmann   AGENT/BLOOD PRODUCT RECEIVING TODAY:              Dyann Kief   AGENT/BLOOD PRODUCT RECEIVING IMMEDIATELY PRIOR TO REACTION:          Gazyva   VS: BP:     132/112   P:       98       SPO2:       96% on 3L                BP:     142/96   P:       131       SPO2:       96% 3L     REACTION(S):           shortness of breath, rigors   PREMEDS:     Benadryl 50 mg IV, Tylenol 650 mg PO, Decadron 20 mg IV   INTERVENTION: Pepcid 20 mg IV, Solumedrol 125 mg IV, Albuterol 2 puffs, morphine 1 mg IV   Review of Systems  Review of Systems  Constitutional:  Positive for chills. Negative for diaphoresis.  Respiratory:  Positive for shortness of breath and wheezing. Negative for cough.   Cardiovascular:  Negative for chest pain and palpitations.  All other systems reviewed and are negative.    Physical Exam  Physical Exam Vitals and nursing note reviewed.  Constitutional:      General: He is in acute distress.     Appearance: He is well-developed. He is not ill-appearing or toxic-appearing.     Comments: Patient with rigors  HENT:     Head: Normocephalic and atraumatic.     Nose: Nose normal.  Eyes:     General: No scleral icterus.       Right eye: No discharge.        Left eye: No discharge.     Conjunctiva/sclera: Conjunctivae normal.  Neck:     Vascular: No JVD.  Cardiovascular:     Rate and Rhythm: Regular rhythm. Tachycardia present.     Pulses: Normal pulses.     Heart sounds: Normal heart sounds.  Pulmonary:     Effort: Respiratory distress present.     Breath sounds: Wheezing present.  Abdominal:     General: There is no distension.  Musculoskeletal:        General: Normal range of motion.     Cervical back: Normal range of motion.  Skin:    General: Skin is warm and dry.  Neurological:     Mental Status: He is oriented to person, place, and time.      GCS: GCS eye subscore is 4. GCS verbal subscore is 5. GCS motor subscore is 6.     Comments: Fluent speech, no facial droop.  Psychiatric:        Behavior: Behavior normal.     OUTCOME:                Patient experienced shortness of breath and rigors approximately 1 hour into treatment.  Emergency medications were given as documented above.  Patient remained tachycardic and EKG was performed showing sinus  tachycardia.  Patient symptoms resolved.  I discussed reaction with oncologist Dr. Earlie Server who advises to restart treatment at half the rate.  This was done and patient tolerated it without any further adverse effects. He was closely observed by RN for remainder of treatment and had no further adverse reactions.

## 2021-09-07 NOTE — Patient Instructions (Signed)
[facility]  Discharge Instructions: Thank you for choosing [facility] to provide your oncology and hematology care.   If you have a lab appointment with the [facility], please go directly to the [facility] and check in at the registration area.   Wear comfortable clothing and clothing appropriate for easy access to any Portacath or PICC line.   We strive to give you quality time with your provider. You may need to reschedule your appointment if you arrive late (15 or more minutes).  Arriving late affects you and other patients whose appointments are after yours.  Also, if you miss three or more appointments without notifying the office, you may be dismissed from the clinic at the provider's discretion.      For prescription refill requests, have your pharmacy contact our office and allow 72 hours for refills to be completed.    Today you received the following chemotherapy and/or immunotherapy agents obinutuzumab      To help prevent nausea and vomiting after your treatment, we encourage you to take your nausea medication as directed.  BELOW ARE SYMPTOMS THAT SHOULD BE REPORTED IMMEDIATELY: *FEVER GREATER THAN 100.4 F (38 C) OR HIGHER *CHILLS OR SWEATING *NAUSEA AND VOMITING THAT IS NOT CONTROLLED WITH YOUR NAUSEA MEDICATION *UNUSUAL SHORTNESS OF BREATH *UNUSUAL BRUISING OR BLEEDING *URINARY PROBLEMS (pain or burning when urinating, or frequent urination) *BOWEL PROBLEMS (unusual diarrhea, constipation, pain near the anus) TENDERNESS IN MOUTH AND THROAT WITH OR WITHOUT PRESENCE OF ULCERS (sore throat, sores in mouth, or a toothache) UNUSUAL RASH, SWELLING OR PAIN  UNUSUAL VAGINAL DISCHARGE OR ITCHING   Items with * indicate a potential emergency and should be followed up as soon as possible or go to the Emergency Department if any problems should occur.  Please show the CHEMOTHERAPY ALERT CARD or IMMUNOTHERAPY ALERT CARD at check-in  to the Emergency Department and triage nurse.  Should you have questions after your visit or need to cancel or reschedule your appointment, please contact [facility]  Dept: [phone]  and follow the prompts.  Office hours are 8:00 a.m. to 4:30 p.m. Monday - Friday. Please note that voicemails left after 4:00 p.m. may not be returned until the following business day.  We are closed weekends and major holidays. You have access to a nurse at all times for urgent questions. Please call the main number to the clinic Dept: [phone] and follow the prompts.   For any non-urgent questions, you may also contact your provider using MyChart. We now offer e-Visits for anyone 64 and older to request care online for non-urgent symptoms. For details visit [email or web address].   Also download the MyChart app! Go to the app store, search "MyChart", open the app, select [facility], and log in with your MyChart username and password.  Masks are optional in the cancer centers. If you would like for your care team to wear a mask while they are taking care of you, please let them know. You may have one support person who is at least 79 years old accompany you for your appointments.Obinutuzumab Injection What is this medication? OBINUTUZUMAB (OH bi nue TOOZ ue mab) treats leukemia and lymphoma. It works by blocking a protein that causes cancer cells to grow and multiply. This helps to slow or stop the spread of cancer cells. It is a monoclonal antibody. This medicine may be used for other purposes; ask your health care provider or pharmacist if  you have questions. COMMON BRAND NAME(S): GAZYVA What should I tell my care team before I take this medication? They need to know if you have any of these conditions: Heart disease Infection, especially a viral infection, such as hepatitis B Lung or breathing disease Take medications that treat or prevent blood clots An unusual or  allergic reaction to obinutuzumab, other medications, foods, dyes, or preservatives Pregnant or trying to get pregnant Breastfeeding How should I use this medication? This medication is for infusion into a vein. It is given by a care team in a hospital or clinic setting. Talk to your care team about the use of this medication in children. Special care may be needed. Overdosage: If you think you have taken too much of this medicine contact a poison control center or emergency room at once. NOTE: This medicine is only for you. Do not share this medicine with others. What if I miss a dose? Keep appointments for follow-up doses as directed. It is important not to miss your dose. Call your care team if you are unable to keep an appointment. What may interact with this medication? Live virus vaccines This list may not describe all possible interactions. Give your health care provider a list of all the medicines, herbs, non-prescription drugs, or dietary supplements you use. Also tell them if you smoke, drink alcohol, or use illegal drugs. Some items may interact with your medicine. What should I watch for while using this medication? Report any side effects that you notice during your treatment right away, such as changes in your breathing, fever, chills, dizziness or lightheadedness. These effects are more common with the first dose. Visit your care team for checks on your progress. You will need to have regular blood work. Report any other side effects. The side effects of this medication can continue after you finish your treatment. Continue your course of treatment even though you feel ill unless your care team tells you to stop. Call your care team for advice if you get a fever, chills or sore throat, or other symptoms of a cold or flu. Do not treat yourself. This medication decreases your body's ability to fight infections. Try to avoid being around people who are sick. This medication may increase  your risk to bruise or bleed. Call your care team if you notice any unusual bleeding. Do not become pregnant while taking this medication or for 6 months after stopping it. Inform your care team if you wish to become pregnant or think you might be pregnant. There is a potential for serious side effects to an unborn child. Talk to your care team or pharmacist for more information. Do not breast-feed an infant while taking this medication or for 6 months after stopping it. What side effects may I notice from receiving this medication? Side effects that you should report to your care team as soon as possible: Allergic reactions--skin rash, itching, hives, swelling of the face, lips, tongue, or throat Bleeding--bloody or black, tar-like stools, vomiting blood or brown material that looks like coffee grounds, red or dark brown urine, small red or purple spots on skin, unusual bruising or bleeding Blood clot--pain, swelling, or warmth in the leg, shortness of breath, chest pain Dizziness, loss of balance or coordination, confusion or trouble speaking Infection--fever, chills, cough, sore throat, wounds that don't heal, pain or trouble when passing urine, general feeling of discomfort or being unwell Infusion reactions--chest pain, shortness of breath or trouble breathing, feeling faint or lightheaded Liver injury--right  upper belly pain, loss of appetite, nausea, light-colored stool, dark yellow or brown urine, yellowing skin or eyes, unusual weakness or fatigue Tumor lysis syndrome (TLS)--nausea, vomiting, diarrhea, decrease in the amount of urine, dark urine, unusual weakness or fatigue, confusion, muscle pain or cramps, fast or irregular heartbeat, joint pain Side effects that usually do not require medical attention (report to your care team if they continue or are bothersome): Bone, joint, or muscle pain Constipation Diarrhea Fatigue Runny or stuffy nose Sore throat This list may not describe all  possible side effects. Call your doctor for medical advice about side effects. You may report side effects to FDA at 1-800-FDA-1088. Where should I keep my medication? This medication is only given in a hospital or clinic and will not be stored at home. NOTE: This sheet is a summary. It may not cover all possible information. If you have questions about this medicine, talk to your doctor, pharmacist, or health care provider.  2023 Elsevier/Gold Standard (2011-12-01 00:00:00)

## 2021-09-07 NOTE — Progress Notes (Signed)
Hypersensitivity Reaction note  Date of event: 09/07/21 Time of event: 1240 Generic name of drug involved: obinutuzumab Name of provider notified of the hypersensitivity reaction: Milinda Cave, PA Was agent that likely caused hypersensitivity reaction added to Allergies List within EMR? No Chain of events including reaction signs/symptoms, treatment administered, and outcome (e.g., drug resumed; drug discontinued; sent to Emergency Department; etc.) Patient was about and hour and twenty minutes into treatment when he suddenly had to get up and go to the bathroom. Upon returning from bathroom, nursing staff noted patient to be extremely short of breath with chills followed by rigors. Medication was stopped, liter of saline started as a rapid bolus. The PA was called and emergency medications given. See eMAR for list of medications and times. After discussion with Dr. Julien Nordmann, patient's oncologist, medication was resumed at half rate for 30 minutes, and then advanced to full rate again. Patient tolerated treatment without any further adverse reactions.     Manuella Ghazi, RN 09/07/2021 2:52 PM

## 2021-09-07 NOTE — Progress Notes (Signed)
Ok to tx with ANC= 1.4

## 2021-09-08 ENCOUNTER — Other Ambulatory Visit: Payer: Self-pay

## 2021-09-08 ENCOUNTER — Inpatient Hospital Stay: Payer: Medicare HMO

## 2021-09-08 ENCOUNTER — Telehealth: Payer: Self-pay | Admitting: Cardiology

## 2021-09-08 ENCOUNTER — Telehealth: Payer: Self-pay

## 2021-09-08 VITALS — BP 136/78 | HR 94 | Temp 98.2°F | Resp 18

## 2021-09-08 DIAGNOSIS — C8300 Small cell B-cell lymphoma, unspecified site: Secondary | ICD-10-CM

## 2021-09-08 DIAGNOSIS — D696 Thrombocytopenia, unspecified: Secondary | ICD-10-CM | POA: Diagnosis not present

## 2021-09-08 DIAGNOSIS — Z5112 Encounter for antineoplastic immunotherapy: Secondary | ICD-10-CM | POA: Diagnosis not present

## 2021-09-08 DIAGNOSIS — D630 Anemia in neoplastic disease: Secondary | ICD-10-CM | POA: Diagnosis not present

## 2021-09-08 DIAGNOSIS — Z79899 Other long term (current) drug therapy: Secondary | ICD-10-CM | POA: Diagnosis not present

## 2021-09-08 DIAGNOSIS — T451X5A Adverse effect of antineoplastic and immunosuppressive drugs, initial encounter: Secondary | ICD-10-CM | POA: Diagnosis not present

## 2021-09-08 DIAGNOSIS — D649 Anemia, unspecified: Secondary | ICD-10-CM | POA: Diagnosis not present

## 2021-09-08 DIAGNOSIS — I878 Other specified disorders of veins: Secondary | ICD-10-CM | POA: Diagnosis not present

## 2021-09-08 DIAGNOSIS — C8308 Small cell B-cell lymphoma, lymph nodes of multiple sites: Secondary | ICD-10-CM | POA: Diagnosis not present

## 2021-09-08 DIAGNOSIS — D701 Agranulocytosis secondary to cancer chemotherapy: Secondary | ICD-10-CM | POA: Diagnosis not present

## 2021-09-08 LAB — SAMPLE TO BLOOD BANK

## 2021-09-08 LAB — TYPE AND SCREEN
ABO/RH(D): A NEG
Antibody Screen: NEGATIVE
Unit division: 0

## 2021-09-08 LAB — PREPARE RBC (CROSSMATCH)

## 2021-09-08 LAB — BPAM RBC
Blood Product Expiration Date: 202308292359
Unit Type and Rh: 600

## 2021-09-08 MED ORDER — ACETAMINOPHEN 325 MG PO TABS
650.0000 mg | ORAL_TABLET | Freq: Once | ORAL | Status: AC
  Administered 2021-09-08: 650 mg via ORAL

## 2021-09-08 MED ORDER — SODIUM CHLORIDE 0.9 % IV SOLN
20.0000 mg | Freq: Once | INTRAVENOUS | Status: AC
  Administered 2021-09-08: 20 mg via INTRAVENOUS
  Filled 2021-09-08: qty 20

## 2021-09-08 MED ORDER — DIPHENHYDRAMINE HCL 50 MG/ML IJ SOLN
50.0000 mg | Freq: Once | INTRAMUSCULAR | Status: AC
  Administered 2021-09-08: 50 mg via INTRAVENOUS

## 2021-09-08 MED ORDER — SODIUM CHLORIDE 0.9 % IV SOLN: Freq: Once | INTRAVENOUS | Status: AC

## 2021-09-08 MED ORDER — METHYLPREDNISOLONE SODIUM SUCC 125 MG IJ SOLR
60.0000 mg | Freq: Once | INTRAMUSCULAR | Status: AC
  Administered 2021-09-08: 60 mg via INTRAVENOUS

## 2021-09-08 MED ORDER — FAMOTIDINE IN NACL 20-0.9 MG/50ML-% IV SOLN
20.0000 mg | Freq: Once | INTRAVENOUS | Status: AC
  Administered 2021-09-08: 20 mg via INTRAVENOUS

## 2021-09-08 MED ORDER — SODIUM CHLORIDE 0.9 % IV SOLN
900.0000 mg | Freq: Once | INTRAVENOUS | Status: AC
  Administered 2021-09-08: 50 mg via INTRAVENOUS
  Filled 2021-09-08: qty 36

## 2021-09-08 NOTE — Telephone Encounter (Signed)
Pt was present here in the infusion center for tx and request we call his wife called stating she does not understand why the pt needs to be seen so frequently.   I have shared with pts wife that pt is starting tx which is given in specific intervals barring any need to cancel or delay tx, along with that he will need to have his labs checked weekly and on some days, twice in one day at a certain interval. She requested a calendar and I advised he was given a calendar yesterday. She states he didn't provider her with one. I have re-printed the calendar and given it to the pt to give to his wife.  I have also advised the pts wife that he was not able to get his blood transfusion today because he removed his blue arm band. She expressed frustration with this and felt the band should not have caused him not to receive his blood. I explained the basics of blood transfusion policy and the importance of safety. Though she did not agree, she expressed understanding of this information.

## 2021-09-08 NOTE — Patient Instructions (Signed)
[facility]  Discharge Instructions: Thank you for choosing [facility] to provide your oncology and hematology care.   If you have a lab appointment with the [facility], please go directly to the [facility] and check in at the registration area.   Wear comfortable clothing and clothing appropriate for easy access to any Portacath or PICC line.   We strive to give you quality time with your provider. You may need to reschedule your appointment if you arrive late (15 or more minutes).  Arriving late affects you and other patients whose appointments are after yours.  Also, if you miss three or more appointments without notifying the office, you may be dismissed from the clinic at the provider's discretion.      For prescription refill requests, have your pharmacy contact our office and allow 72 hours for refills to be completed.    Today you received the following chemotherapy and/or immunotherapy agents; G      To help prevent nausea and vomiting after your treatment, we encourage you to take your nausea medication as directed.  BELOW ARE SYMPTOMS THAT SHOULD BE REPORTED IMMEDIATELY: *FEVER GREATER THAN 100.4 F (38 C) OR HIGHER *CHILLS OR SWEATING *NAUSEA AND VOMITING THAT IS NOT CONTROLLED WITH YOUR NAUSEA MEDICATION *UNUSUAL SHORTNESS OF BREATH *UNUSUAL BRUISING OR BLEEDING *URINARY PROBLEMS (pain or burning when urinating, or frequent urination) *BOWEL PROBLEMS (unusual diarrhea, constipation, pain near the anus) TENDERNESS IN MOUTH AND THROAT WITH OR WITHOUT PRESENCE OF ULCERS (sore throat, sores in mouth, or a toothache) UNUSUAL RASH, SWELLING OR PAIN  UNUSUAL VAGINAL DISCHARGE OR ITCHING   Items with * indicate a potential emergency and should be followed up as soon as possible or go to the Emergency Department if any problems should occur.  Please show the CHEMOTHERAPY ALERT CARD or IMMUNOTHERAPY ALERT CARD at check-in to the  Emergency Department and triage nurse.  Should you have questions after your visit or need to cancel or reschedule your appointment, please contact [facility]  Dept: [phone]  and follow the prompts.  Office hours are 8:00 a.m. to 4:30 p.m. Monday - Friday. Please note that voicemails left after 4:00 p.m. may not be returned until the following business day.  We are closed weekends and major holidays. You have access to a nurse at all times for urgent questions. Please call the main number to the clinic Dept: [phone] and follow the prompts.   For any non-urgent questions, you may also contact your provider using MyChart. We now offer e-Visits for anyone 53 and older to request care online for non-urgent symptoms. For details visit [email or web address].   Also download the MyChart app! Go to the app store, search "MyChart", open the app, select [facility], and log in with your MyChart username and password.  Masks are optional in the cancer centers. If you would like for your care team to wear a mask while they are taking care of you, please let them know. You may have one support Zissy Hamlett who is at least 79 years old accompany you for your appointments.

## 2021-09-08 NOTE — Telephone Encounter (Signed)
Pt would like a callback regarding Heart Monitor. Pt states that the monitor came off last night. Please advise

## 2021-09-09 ENCOUNTER — Other Ambulatory Visit: Payer: Self-pay

## 2021-09-09 ENCOUNTER — Inpatient Hospital Stay: Payer: Medicare HMO

## 2021-09-09 ENCOUNTER — Other Ambulatory Visit (HOSPITAL_COMMUNITY): Payer: Medicare HMO

## 2021-09-09 DIAGNOSIS — D630 Anemia in neoplastic disease: Secondary | ICD-10-CM | POA: Diagnosis not present

## 2021-09-09 DIAGNOSIS — I878 Other specified disorders of veins: Secondary | ICD-10-CM | POA: Diagnosis not present

## 2021-09-09 DIAGNOSIS — D701 Agranulocytosis secondary to cancer chemotherapy: Secondary | ICD-10-CM | POA: Diagnosis not present

## 2021-09-09 DIAGNOSIS — Z79899 Other long term (current) drug therapy: Secondary | ICD-10-CM | POA: Diagnosis not present

## 2021-09-09 DIAGNOSIS — Z5112 Encounter for antineoplastic immunotherapy: Secondary | ICD-10-CM | POA: Diagnosis not present

## 2021-09-09 DIAGNOSIS — D696 Thrombocytopenia, unspecified: Secondary | ICD-10-CM | POA: Diagnosis not present

## 2021-09-09 DIAGNOSIS — D649 Anemia, unspecified: Secondary | ICD-10-CM | POA: Diagnosis not present

## 2021-09-09 DIAGNOSIS — T451X5A Adverse effect of antineoplastic and immunosuppressive drugs, initial encounter: Secondary | ICD-10-CM | POA: Diagnosis not present

## 2021-09-09 DIAGNOSIS — C8308 Small cell B-cell lymphoma, lymph nodes of multiple sites: Secondary | ICD-10-CM | POA: Diagnosis not present

## 2021-09-09 DIAGNOSIS — C8300 Small cell B-cell lymphoma, unspecified site: Secondary | ICD-10-CM

## 2021-09-09 MED ORDER — DIPHENHYDRAMINE HCL 25 MG PO CAPS
25.0000 mg | ORAL_CAPSULE | Freq: Once | ORAL | Status: AC
  Administered 2021-09-09: 25 mg via ORAL

## 2021-09-09 MED ORDER — ACETAMINOPHEN 325 MG PO TABS
650.0000 mg | ORAL_TABLET | Freq: Once | ORAL | Status: AC
  Administered 2021-09-09: 650 mg via ORAL

## 2021-09-09 MED ORDER — SODIUM CHLORIDE 0.9% IV SOLUTION
250.0000 mL | Freq: Once | INTRAVENOUS | Status: AC
  Administered 2021-09-09: 250 mL via INTRAVENOUS

## 2021-09-09 NOTE — Patient Instructions (Signed)

## 2021-09-10 LAB — TYPE AND SCREEN
ABO/RH(D): A NEG
Antibody Screen: NEGATIVE
Unit division: 0

## 2021-09-10 LAB — BPAM RBC
Blood Product Expiration Date: 202308202359
ISSUE DATE / TIME: 202308111003
Unit Type and Rh: 600

## 2021-09-12 ENCOUNTER — Telehealth: Payer: Self-pay | Admitting: Medical Oncology

## 2021-09-12 NOTE — Telephone Encounter (Signed)
Does pt have transportation to [facility] for echocardiogram after tx on wed. I told her to contact cardiology.

## 2021-09-12 NOTE — Telephone Encounter (Signed)
Patient had worn monitor about 3 days which was the ordered duration. Instructed Mrs. Parrella to mail monitor back to [facility] in the orange and white box with prepaid [employer] on it.

## 2021-09-13 ENCOUNTER — Other Ambulatory Visit: Payer: Self-pay

## 2021-09-13 ENCOUNTER — Telehealth: Payer: Self-pay | Admitting: Cardiology

## 2021-09-13 DIAGNOSIS — C8300 Small cell B-cell lymphoma, unspecified site: Secondary | ICD-10-CM

## 2021-09-13 MED FILL — Dexamethasone Sodium Phosphate Inj 100 MG/10ML: INTRAMUSCULAR | Qty: 2 | Status: AC

## 2021-09-13 NOTE — Telephone Encounter (Signed)
Pt's wife is requesting call back in regards to appt on 08/16 with echo. She believes they may have to cancel appt, but would like to speak to someone first.

## 2021-09-13 NOTE — Telephone Encounter (Signed)
Left [name] a message to call back.  Will also send this message to our Echo Scheduler to reach out to pt and wife, to reschedule this appt if still needed.

## 2021-09-13 NOTE — Telephone Encounter (Signed)
Joseph, Hofman - 09/13/2021 11:20 AM Frederic Jericho  Sent: Tue September 13, 2021 11:56 AM  To: Nuala Alpha, LPN          Message  I called patient and spoke with wife and she declined to schedule this test. She needs to speak with you about patient. She states that he is having cancer treatments at this time and she doesn't know when they will be able to schedule..   Called the pts wife back.  She is aware that he needs to have an echo done, to follow his EF and heart valves, especially while being on chemo. Wife states they would like to reschedule the echo now.  She is asking if Lattie Haw could give her a callback tomorrow after 3 pm to reschedule his echo, for he will have his full chemo treatment schedule at that time.  She is aware that I will make Lattie Haw aware of this, and to cancel 8/16 echo and call them back tomorrow afternoon to reschedule this appt.  Wife verbalized understanding and agrees with this plan.

## 2021-09-14 ENCOUNTER — Other Ambulatory Visit (HOSPITAL_BASED_OUTPATIENT_CLINIC_OR_DEPARTMENT_OTHER): Payer: Medicare HMO

## 2021-09-14 ENCOUNTER — Inpatient Hospital Stay: Payer: Medicare HMO

## 2021-09-14 ENCOUNTER — Telehealth: Payer: Self-pay | Admitting: Medical Oncology

## 2021-09-14 ENCOUNTER — Other Ambulatory Visit: Payer: Self-pay

## 2021-09-14 VITALS — BP 139/68 | HR 91 | Temp 97.8°F | Resp 17 | Wt 206.5 lb

## 2021-09-14 DIAGNOSIS — D630 Anemia in neoplastic disease: Secondary | ICD-10-CM | POA: Diagnosis not present

## 2021-09-14 DIAGNOSIS — D649 Anemia, unspecified: Secondary | ICD-10-CM | POA: Diagnosis not present

## 2021-09-14 DIAGNOSIS — D696 Thrombocytopenia, unspecified: Secondary | ICD-10-CM | POA: Diagnosis not present

## 2021-09-14 DIAGNOSIS — C8300 Small cell B-cell lymphoma, unspecified site: Secondary | ICD-10-CM

## 2021-09-14 DIAGNOSIS — C8308 Small cell B-cell lymphoma, lymph nodes of multiple sites: Secondary | ICD-10-CM | POA: Diagnosis not present

## 2021-09-14 DIAGNOSIS — D701 Agranulocytosis secondary to cancer chemotherapy: Secondary | ICD-10-CM | POA: Diagnosis not present

## 2021-09-14 DIAGNOSIS — Z5112 Encounter for antineoplastic immunotherapy: Secondary | ICD-10-CM | POA: Diagnosis not present

## 2021-09-14 DIAGNOSIS — T451X5A Adverse effect of antineoplastic and immunosuppressive drugs, initial encounter: Secondary | ICD-10-CM | POA: Diagnosis not present

## 2021-09-14 DIAGNOSIS — Z79899 Other long term (current) drug therapy: Secondary | ICD-10-CM | POA: Diagnosis not present

## 2021-09-14 DIAGNOSIS — I878 Other specified disorders of veins: Secondary | ICD-10-CM | POA: Diagnosis not present

## 2021-09-14 LAB — CBC WITH DIFFERENTIAL (CANCER CENTER ONLY)
Abs Immature Granulocytes: 0.01 10*3/uL (ref 0.00–0.07)
Basophils Absolute: 0 10*3/uL (ref 0.0–0.1)
Basophils Relative: 1 %
Eosinophils Absolute: 0 10*3/uL (ref 0.0–0.5)
Eosinophils Relative: 1 %
HCT: 24 % — ABNORMAL LOW (ref 39.0–52.0)
Hemoglobin: 8 g/dL — ABNORMAL LOW (ref 13.0–17.0)
Immature Granulocytes: 1 %
Lymphocytes Relative: 35 %
Lymphs Abs: 0.5 10*3/uL — ABNORMAL LOW (ref 0.7–4.0)
MCH: 31.1 pg (ref 26.0–34.0)
MCHC: 33.3 g/dL (ref 30.0–36.0)
MCV: 93.4 fL (ref 80.0–100.0)
Monocytes Absolute: 0 10*3/uL — ABNORMAL LOW (ref 0.1–1.0)
Monocytes Relative: 1 %
Neutro Abs: 0.9 10*3/uL — ABNORMAL LOW (ref 1.7–7.7)
Neutrophils Relative %: 61 %
Platelet Count: 115 10*3/uL — ABNORMAL LOW (ref 150–400)
RBC: 2.57 MIL/uL — ABNORMAL LOW (ref 4.22–5.81)
RDW: 18.4 % — ABNORMAL HIGH (ref 11.5–15.5)
WBC Count: 1.5 10*3/uL — ABNORMAL LOW (ref 4.0–10.5)
nRBC: 0 % (ref 0.0–0.2)

## 2021-09-14 LAB — CMP (CANCER CENTER ONLY)
ALT: 33 U/L (ref 0–44)
AST: 23 U/L (ref 15–41)
Albumin: 3.7 g/dL (ref 3.5–5.0)
Alkaline Phosphatase: 76 U/L (ref 38–126)
Anion gap: 6 (ref 5–15)
BUN: 21 mg/dL (ref 8–23)
CO2: 27 mmol/L (ref 22–32)
Calcium: 9.2 mg/dL (ref 8.9–10.3)
Chloride: 101 mmol/L (ref 98–111)
Creatinine: 0.93 mg/dL (ref 0.61–1.24)
GFR, Estimated: >60 mL/min (ref >60)
Glucose, Bld: 121 mg/dL — ABNORMAL HIGH (ref 70–99)
Potassium: 4.2 mmol/L (ref 3.5–5.1)
Sodium: 134 mmol/L — ABNORMAL LOW (ref 135–145)
Total Bilirubin: 0.5 mg/dL (ref 0.3–1.2)
Total Protein: 7.6 g/dL (ref 6.5–8.1)

## 2021-09-14 LAB — PHOSPHORUS
Phosphorus: 3.3 mg/dL (ref 2.5–4.6)
Phosphorus: 3.2 mg/dL (ref 2.5–4.6)

## 2021-09-14 LAB — MAGNESIUM
Magnesium: 1.9 mg/dL (ref 1.7–2.4)
Magnesium: 2.1 mg/dL (ref 1.7–2.4)

## 2021-09-14 LAB — SAMPLE TO BLOOD BANK

## 2021-09-14 LAB — URIC ACID
Uric Acid, Serum: 4.9 mg/dL (ref 3.7–8.6)
Uric Acid, Serum: 4.6 mg/dL (ref 3.7–8.6)

## 2021-09-14 MED ORDER — DIPHENHYDRAMINE HCL 50 MG/ML IJ SOLN
50.0000 mg | Freq: Once | INTRAMUSCULAR | Status: AC
  Administered 2021-09-14: 50 mg via INTRAVENOUS

## 2021-09-14 MED ORDER — METHYLPREDNISOLONE SODIUM SUCC 125 MG IJ SOLR
60.0000 mg | Freq: Once | INTRAMUSCULAR | Status: AC
  Administered 2021-09-14: 60 mg via INTRAVENOUS

## 2021-09-14 MED ORDER — SODIUM CHLORIDE 0.9 % IV SOLN
1000.0000 mg | Freq: Once | INTRAVENOUS | Status: AC
  Administered 2021-09-14: 1000 mg via INTRAVENOUS
  Filled 2021-09-14: qty 40

## 2021-09-14 MED ORDER — ACETAMINOPHEN 325 MG PO TABS
650.0000 mg | ORAL_TABLET | Freq: Once | ORAL | Status: AC
  Administered 2021-09-14: 650 mg via ORAL

## 2021-09-14 MED ORDER — FAMOTIDINE IN NACL 20-0.9 MG/50ML-% IV SOLN
20.0000 mg | Freq: Once | INTRAVENOUS | Status: AC
  Administered 2021-09-14: 20 mg via INTRAVENOUS

## 2021-09-14 MED ORDER — SODIUM CHLORIDE 0.9 % IV SOLN
20.0000 mg | Freq: Once | INTRAVENOUS | Status: AC
  Administered 2021-09-14: 20 mg via INTRAVENOUS
  Filled 2021-09-14: qty 20

## 2021-09-14 MED ORDER — SODIUM CHLORIDE 0.9 % IV SOLN: Freq: Once | INTRAVENOUS | Status: AC

## 2021-09-14 NOTE — Patient Instructions (Signed)
[facility]  Discharge Instructions: Thank you for choosing [facility] to provide your oncology and hematology care.   If you have a lab appointment with the [facility], please go directly to the [facility] and check in at the registration area.   Wear comfortable clothing and clothing appropriate for easy access to any Portacath or PICC line.   We strive to give you quality time with your provider. You may need to reschedule your appointment if you arrive late (15 or more minutes).  Arriving late affects you and other patients whose appointments are after yours.  Also, if you miss three or more appointments without notifying the office, you may be dismissed from the clinic at the provider's discretion.      For prescription refill requests, have your pharmacy contact our office and allow 72 hours for refills to be completed.    Today you received the following chemotherapy and/or immunotherapy agents: gazyva      To help prevent nausea and vomiting after your treatment, we encourage you to take your nausea medication as directed.  BELOW ARE SYMPTOMS THAT SHOULD BE REPORTED IMMEDIATELY: *FEVER GREATER THAN 100.4 F (38 C) OR HIGHER *CHILLS OR SWEATING *NAUSEA AND VOMITING THAT IS NOT CONTROLLED WITH YOUR NAUSEA MEDICATION *UNUSUAL SHORTNESS OF BREATH *UNUSUAL BRUISING OR BLEEDING *URINARY PROBLEMS (pain or burning when urinating, or frequent urination) *BOWEL PROBLEMS (unusual diarrhea, constipation, pain near the anus) TENDERNESS IN MOUTH AND THROAT WITH OR WITHOUT PRESENCE OF ULCERS (sore throat, sores in mouth, or a toothache) UNUSUAL RASH, SWELLING OR PAIN  UNUSUAL VAGINAL DISCHARGE OR ITCHING   Items with * indicate a potential emergency and should be followed up as soon as possible or go to the Emergency Department if any problems should occur.  Please show the CHEMOTHERAPY ALERT CARD or IMMUNOTHERAPY ALERT CARD at check-in to the  Emergency Department and triage nurse.  Should you have questions after your visit or need to cancel or reschedule your appointment, please contact [facility]  Dept: [phone]  and follow the prompts.  Office hours are 8:00 a.m. to 4:30 p.m. Monday - Friday. Please note that voicemails left after 4:00 p.m. may not be returned until the following business day.  We are closed weekends and major holidays. You have access to a nurse at all times for urgent questions. Please call the main number to the clinic Dept: [phone] and follow the prompts.   For any non-urgent questions, you may also contact your provider using MyChart. We now offer e-Visits for anyone 4 and older to request care online for non-urgent symptoms. For details visit [email or web address].   Also download the MyChart app! Go to the app store, search "MyChart", open the app, select [facility], and log in with your MyChart username and password.  Masks are optional in the cancer centers. If you would like for your care team to wear a mask while they are taking care of you, please let them know. You may have one support person who is at least 79 years old accompany you for your appointments.

## 2021-09-14 NOTE — Telephone Encounter (Signed)
CRITICAL VALUE STICKER  CRITICAL VALUE: HGB 8.0  RECEIVER (on-site recipient of call):Kateline Kinkade  DATE & TIME NOTIFIED: 09/14/21 @ [facility] (representative from lab):  MD NOTIFIED: Mohamed  TIME OF NOTIFICATION: 4497 on  09/14/21  RESPONSE:  It is better.Moniter .

## 2021-09-15 ENCOUNTER — Inpatient Hospital Stay: Payer: Medicare HMO

## 2021-09-15 NOTE — Telephone Encounter (Signed)
Wife is calling back and would like to speak to [facility] in regards to reschding. Please advise

## 2021-09-16 NOTE — Telephone Encounter (Signed)
Wife is calling to report that she would like Lattie Haw to give her a call back to reschedule the pts echo for sometime next Monday or Tuesday, for they will not be available until then.  She prefers him having this done at [facility] location vs [facility].  Wife aware that I will make contact with Lattie Haw and have her call them back to reschedule his echo.  Wife verbalized understanding and agrees with this plan.

## 2021-09-16 NOTE — Telephone Encounter (Signed)
Pts echo is scheduled for 09/20/21 at 1135. Pt and wife made aware of appt date and time by [facility] Scheduling.

## 2021-09-17 NOTE — Progress Notes (Unsigned)
Cardiology Office Note    Date:  09/20/2021   ID:  Joseph, Moyer 05-15-1940, MRN [id number]  PCP:  [name], MD  Cardiologist:  Freada Bergeron, MD  Electrophysiologist:  None   Chief Complaint: f/u tachycardia  History of Present Illness:   Joseph Moyer is a 81 y.o. 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   He was originally seen by cardiology for evaluation of shortness of breath with coronary CTA in 01/2020 showing findings below with mild-moderate nonobstructive CAD with negative FFR. The study also incidentally picked up extensive lymphadenopathy and he was subsequently found to have small lymphocytic lymphoma. 2D echo 12/2019 showed EF 65-70%, no significant valvular disease or diastolic dysfunction. 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    He was recently seen for routine follow-up 08/2021 reporting continued chronic exertional dyspnea without acute change or new symptoms. He was noted to be mildly tachycardic (sinus) with frequent PVCs by EKG. He had ongoing issues with anemia with prior baseline 11-13, with Hgb 7s at time of visit. As above, his anemia has been managed by oncology. He did report some BRBPR and was advised to f/u with his GI team. He had had a colonoscopy in 2017 with polyp removed. He was otherwise well appearing. Per discussion with Dr. Johney Frame, given issues with anemia, thrombocytopenia, BRBPR and nonobstructive disease, we discontinued his ASA. Expedited echo and monitor were ordered. Patient scheduled echo earlier today which demonstrated EF 55-60%, G1DD, normal  strain, normal RV, normal IVC, no significant valve disease. The monitor's preliminary read showed min HR 62, max 138bpm, average HR 102bpm, rare PACs, frequent PVCs (11.9%).   He is seen for follow-up today overall feeling better. He reports he had to have a blood transfusion (Hgb dropped to 6.9). The last value was 8.0, followed by oncology. He has noticed an improvement in his shortness of breath over the last week or so. He denies any chest pain or awareness of PVCs. We discussed echo and monitor findings.  Labwork independently reviewed: 08/2021 Mg 2.1, WBC 1.5, Hgb 8.0 (nadir 6.9), plt 115, Na 134, K 4.2, Cr 0.93, LFTS ok, TSH wnl, LDL 121 03/2020 LDL 64   Cardiology Studies:   Studies reviewed are outlined and summarized above. Reports included below if pertinent.   Echo 09/20/21    1. Left ventricular ejection fraction, by estimation, is 55 to 60%. Left  ventricular ejection fraction by 3D volume is 55 %. The left ventricle has  normal function. The left ventricle has no regional wall motion  abnormalities. Left ventricular diastolic   parameters are consistent with Grade I diastolic dysfunction (impaired  relaxation). The average left ventricular global longitudinal strain is  -21.0 %. The global longitudinal strain is normal.   2. Right ventricular systolic function is normal. The right ventricular  size is normal.   3. The mitral valve is normal in structure. No evidence of mitral valve  regurgitation. No evidence of mitral stenosis.   4. The aortic valve is tricuspid. Aortic valve regurgitation is not  visualized. No aortic stenosis is present. Aortic valve area, by VTI  measures 1.82 cm. Aortic valve mean gradient measures 10.6 mmHg. Aortic  valve Vmax measures 2.22 m/s.   5. The inferior vena cava is normal in size with greater than 50%  respiratory variability, suggesting right atrial pressure of 3 mmHg.  Cor CT 01/2020 IMPRESSION: 1. Coronary calcium score of 257.  This was 67th percentile for age and sex matched control.   2.  Normal coronary origin with right dominance.   3. Moderate plaque in the mid LAD, mild plaque in the proximal LAD and D1. CAD RADS 3.   4.  Mild calcification of the aorta.   5.  Mild calcification of the aortic valve.   6. Will send study for FFRct to assess for obstructive disease in the LAD.   Skeet Latch, MD     Electronically Signed   By: Skeet Latch   On: 02/10/2020 19:12   FINDINGS: FFRct analysis was performed on the original cardiac CT angiogram dataset. Diagrammatic representation of the FFRct analysis is provided in a separate PDF document in PACS. This dictation was created using the PDF document and an interactive 3D model of the results. 3D model is not available in the EMR/PACS. Normal FFR range is >0.80.   1. Left Main: FFRct 0.98   2. LAD: FFRct 0.97 proximal, 0.91 mid, 0.82 distal.  D1 FFRct 0.95.   3. LCX: FFRct 0.98 proximal, 0.98 mid.  OM1 FFRct 0.98.   4. RCA: FFRct 0.98 proximal, 0.94 mid, 0.85 distal.   IMPRESSION: 1. FFRct findings consistent with non-obstructive coronary artery disease.   2.  Recommend aggressive risk factor modification.   Tiffany C. Oval Linsey, MD     Electronically Signed   By: Skeet Latch   On: 02/11/2020 16:45     IMPRESSION: 1. Evidence for mediastinal and hilar lymphadenopathy. Findings are nonspecific and mediastinum is incompletely imaged. Recommend further evaluation with a chest CT with IV contrast. 2. Small hiatal hernia. 3.  Aortic Atherosclerosis (ICD10-I70.0).   These results will be called to the ordering clinician or representative by the Radiologist Assistant, and communication documented in the PACS or [employer].   Electronically Signed: By: Markus Daft M.D. On: 02/10/2020 16:30   2d Echo 12/2019  1. Left ventricular ejection fraction, by estimation, is 65 to 70%. The  left ventricle has normal function.  The left ventricle has no regional  wall motion abnormalities. Left ventricular diastolic parameters were  normal.   2. Right ventricular systolic function is normal. The right ventricular  size is normal. Tricuspid regurgitation signal is inadequate for assessing  PA pressure.   3. The mitral valve is grossly normal. No evidence of mitral valve  regurgitation. No evidence of mitral stenosis.   4. The aortic valve is tricuspid. There is mild calcification of the  aortic valve. Aortic valve regurgitation is not visualized. No aortic  stenosis is present.   5. The inferior vena cava is normal in size with greater than 50%  respiratory variability, suggesting right atrial pressure of 3 mmHg.   Conclusion(s)/Recommendation(s): Normal biventricular function without  evidence of hemodynamically significant valvular heart disease.     Past Medical History:  Diagnosis Date   Allergy    Arthritis    CAD in native artery    COPD (chronic obstructive pulmonary disease) (HCC)    GERD (gastroesophageal reflux disease)    Hyperlipidemia    Hypertension    Mild carotid artery disease (HCC)    Small lymphocytic lymphoma ([place])     Past Surgical History:  Procedure Laterality Date   BRONCHIAL NEEDLE  ASPIRATION BIOPSY  03/12/2020   Procedure: BRONCHIAL NEEDLE ASPIRATION BIOPSIES;  Surgeon: Garner Nash, DO;  Location: [facility];  Service: Pulmonary;;   CIRCUMCISION     COLONOSCOPY WITH PROPOFOL N/A 05/10/2015   Procedure: COLONOSCOPY WITH PROPOFOL;  Surgeon: Garlan Fair, MD;  Location: [facility];  Service: Endoscopy;  Laterality: N/A;   DECOMPRESSIVE LUMBAR LAMINECTOMY LEVEL 1 N/A 06/15/2018   Procedure: Gordy Levan decompression L1-L2/ complete inferior facetectomyof L1. Posterior lateral arthrodesiswith autograft bone L1-L2.;  Surgeon: Melina Schools, MD;  Location: [facility];  Service: Orthopedics;  Laterality: N/A;   HEMORRHOID SURGERY     VIDEO BRONCHOSCOPY WITH ENDOBRONCHIAL ULTRASOUND  N/A 03/12/2020   Procedure: VIDEO BRONCHOSCOPY WITH ENDOBRONCHIAL ULTRASOUND;  Surgeon: Garner Nash, DO;  Location: [facility];  Service: Pulmonary;  Laterality: N/A;    Current Medications: Current Meds  Medication Sig   allopurinol (ZYLOPRIM) 100 MG tablet Take 3 tablets (300 mg total) by mouth 2 (two) times daily.   amLODipine (NORVASC) 5 MG tablet Take 5 mg by mouth daily.   atorvastatin (LIPITOR) 40 MG tablet Take 1 tablet (40 mg total) by mouth daily. Please make overdue appt with Dr. Johney Frame before anymore refills. Thank you 3rd and FINAL attempt.   diphenhydrAMINE HCl, Sleep, 50 MG CAPS Take 50 mg by mouth at bedtime.   FeFum-FePoly-FA-B Cmp-C-Biot (INTEGRA PLUS) CAPS Take 1 capsule by mouth daily.   losartan (COZAAR) 25 MG tablet Take 1 tablet (25 mg total) by mouth daily. Please make overdue appt with Dr. Johney Frame before anymore refills. Thank you 3rd and Final attempt   meloxicam (MOBIC) 15 MG tablet Take 15 mg by mouth daily as needed.   methocarbamol (ROBAXIN) 750 MG tablet Take 750 mg by mouth every 6 (six) hours as needed.   Naphazoline-Glycerin (CLEAR EYES REDNESS RELIEF OP) Place 1 drop into both eyes as needed.   omeprazole (PRILOSEC) 20 MG capsule TAKE 1 CAPSULE BY MOUTH EVERY DAY (Patient taking differently: Take by mouth as needed.)   OVER THE COUNTER MEDICATION Apply 1 application topically daily as needed (pain). Hemp cream   prochlorperazine (COMPAZINE) 10 MG tablet Take 1 tablet (10 mg total) by mouth every 6 (six) hours as needed for nausea or vomiting.   tamsulosin (FLOMAX) 0.4 MG CAPS capsule Take 0.4 mg by mouth daily.   umeclidinium bromide (INCRUSE ELLIPTA) 62.5 MCG/INH AEPB Inhale 1 puff into the lungs daily.   venetoclax (VENCLEXTA) 10 & 50 & 100 MG Starter Pack Take 20 mg by mouth daily for 7 days, then 50 mg daily for 7 days, then 100 mg daily for 7 days, then 200 mg daily for 7 days. Take with food & water.      Allergies:   Obinutuzumab,  Oxycodone, Ramipril, and Rosuvastatin   Social History   Socioeconomic History   Marital status: Married    Spouse name: Not on file   Number of children: Not on file   Years of education: Not on file   Highest education level: Not on file  Occupational History   Not on file  Tobacco Use   Smoking status: Former    Types: Cigarettes    Quit date: 51    Years since quitting: 29.6   Smokeless tobacco: Never  Vaping Use   Vaping Use: Never used  Substance and Sexual Activity   Alcohol use: Yes    Alcohol/week: 0.0 standard drinks of alcohol    Comment: occassionally   Drug use: No   Sexual  activity: Not on file  Other Topics Concern   Not on file  Social History Narrative   Not on file   Social Determinants of Health   Financial Resource Strain: Not on file  Food Insecurity: Not on file  Transportation Needs: Not on file  Physical Activity: Not on file  Stress: Not on file  Social Connections: Socially Integrated (06/17/2018)   Social Connection and Isolation Panel [NHANES]    Frequency of Communication with Friends and Family: Three times a week    Frequency of Social Gatherings with Friends and Family: Three times a week    Attends Religious Services: 1 to 4 times per year    Active Member of Clubs or Organizations: Yes    Attends Archivist Meetings: Never    Marital Status: Married     Family History:  The patient's family history includes Diabetes in his brother, mother, and sister.  ROS:   Please see the history of present illness. All other systems are reviewed and otherwise negative.    EKG(s)/Additional Labs   EKG:  EKG is ordered today, personally reviewed, demonstrating NSR 93bpm, nonspecific STTW changes inferiorly and V5-V6 similar to prior tracings back to 2020  Recent Labs: 09/05/2021: TSH 1.760 09/14/2021: ALT 33; BUN 21; Creatinine 0.93; Hemoglobin 8.0; Magnesium 2.1; Platelet Count 115; Potassium 4.2; Sodium 134  Recent Lipid  Panel    Component Value Date/Time   CHOL 192 09/05/2021 1044   TRIG 90 09/05/2021 1044   HDL 55 09/05/2021 1044   CHOLHDL 3.5 09/05/2021 1044   LDLCALC 121 (H) 09/05/2021 1044    PHYSICAL EXAM:    VS:  BP 126/74   Pulse 93   Ht 5' 11.75" (1.822 m)   Wt 207 lb 9.6 oz (94.2 kg)   SpO2 99%   BMI 28.35 kg/m   BMI: Body mass index is 28.35 kg/m.  GEN: Well nourished, well developed male in no acute distress HEENT: normocephalic, atraumatic Neck: no JVD, carotid bruits, or masses Cardiac: regular rhythm, upper normal rate; no murmurs, rubs, or gallops, no edema  Respiratory:  clear to auscultation bilaterally, normal work of breathing GI: soft, nontender, nondistended, + BS MS: no deformity or atrophy Skin: warm and dry, no rash Neuro:  Alert and Oriented x 3, Strength and sensation are intact, follows commands Psych: euthymic mood, full affect  Wt Readings from Last 3 Encounters:  09/20/21 207 lb 9.6 oz (94.2 kg)  09/14/21 206 lb 8 oz (93.7 kg)  09/07/21 209 lb 1.6 oz (94.8 kg)     ASSESSMENT & PLAN:   1. Exertional dyspnea - has h/o chronic exertional dyspnea. He has been receiving infusion therapy for his lymphoma and refractory anemia/thrombocytopenia and actually reports improvement in dyspnea over the last week or so. I suspect anemia was playing a role. Prior cardiac evaluation showed mild-moderate CAD but negative FFR suggesting that lesions were not hemodynamically significant. His updated echocardiogram shows no concerns of LV dysfunction or pericardial effusion. He deferred CXR, lungs are clear on exam today. No signs of volume overload. He is not hypoxic or describing any chest pain. Will follow symptoms on beta blocker for #2. He will notify for any worsening symptoms.  2. Sinus tachycardia and frequent PVCs - monitor and echo reviewed as above. HR is better today than prior visit in 90s-low 100s, suspect driven primarily by anemia. His monitor however did show 11.9%  PVCs. Given his PVC burden, will trial low dose Toprol '25mg'$  daily and  follow up in 2 months to see how he is doing.  3. Nonobstructive CAD, HLD - no longer on ASA due to anemia/thrombocytopenia requiring blood transfusion as well as prior BRBPR for which I had recommended he touch base with GI. He has not had recurrence of this off aspirin. Regarding statin, we reviewed that his recent LDL was not at goal. He shares that he was off his atorvastatin for a period of time but has restarted without any adverse effects (previously had itching with rosuvastatin but tolerating atorvastatin fine). Therefore will hold off dose adjustment at this time. Will refill at present dose and plan to revisit lipid recheck at next OV. Advised to come fasting to that visit. Continued medical therapy is appropriate in light of comorbidities.  4. Mild carotid artery disease - no recent stroke symptoms. In light of multiple other medical problems as well as anemia, would defer repeat screening for now given previous mild disease and follow for any TIA/CVA symptoms.  5. Essential HTN - BP controlled. Meds were clarified after last visit, was somewhat confusing what he was taking. He requests refill for losartan today. Recommend to continue amlodipine as well - he denies needing refill at this time. Follow BP with addition of metoprolol.    Disposition: F/u with me in 2 months. He requests work note for days that he was in clinic, this was provided. He also requests work note for his days of treatment with the cancer center and I recommended he discuss with his oncologist.   Medication Adjustments/Labs and Tests Ordered: Current medicines are reviewed at length with the patient today.  Concerns regarding medicines are outlined above. Medication changes, Labs and Tests ordered today are summarized above and listed in the Patient Instructions accessible in Encounters.   Signed, Charlie Pitter, PA-C  09/20/2021 2:40 PM    [place] Phone: [phone]; Fax: [phone]

## 2021-09-19 DIAGNOSIS — I2510 Atherosclerotic heart disease of native coronary artery without angina pectoris: Secondary | ICD-10-CM | POA: Diagnosis not present

## 2021-09-20 ENCOUNTER — Ambulatory Visit (HOSPITAL_COMMUNITY): Payer: Medicare HMO | Attending: Physician Assistant

## 2021-09-20 ENCOUNTER — Ambulatory Visit: Payer: Medicare HMO | Admitting: Physician Assistant

## 2021-09-20 ENCOUNTER — Encounter: Payer: Self-pay | Admitting: Physician Assistant

## 2021-09-20 VITALS — BP 126/74 | HR 93 | Ht 71.75 in | Wt 207.6 lb

## 2021-09-20 DIAGNOSIS — E785 Hyperlipidemia, unspecified: Secondary | ICD-10-CM | POA: Insufficient documentation

## 2021-09-20 DIAGNOSIS — R0609 Other forms of dyspnea: Secondary | ICD-10-CM | POA: Diagnosis not present

## 2021-09-20 DIAGNOSIS — I779 Disorder of arteries and arterioles, unspecified: Secondary | ICD-10-CM | POA: Diagnosis not present

## 2021-09-20 DIAGNOSIS — I10 Essential (primary) hypertension: Secondary | ICD-10-CM | POA: Insufficient documentation

## 2021-09-20 DIAGNOSIS — R0602 Shortness of breath: Secondary | ICD-10-CM | POA: Insufficient documentation

## 2021-09-20 DIAGNOSIS — R000 Tachycardia, unspecified: Secondary | ICD-10-CM

## 2021-09-20 DIAGNOSIS — I2510 Atherosclerotic heart disease of native coronary artery without angina pectoris: Secondary | ICD-10-CM

## 2021-09-20 DIAGNOSIS — I493 Ventricular premature depolarization: Secondary | ICD-10-CM | POA: Diagnosis not present

## 2021-09-20 LAB — ECHOCARDIOGRAM COMPLETE
AR max vel: 1.73 cm2
AV Area VTI: 1.82 cm2
AV Area mean vel: 1.68 cm2
AV Mean grad: 10.6 mmHg
AV Peak grad: 19.6 mmHg
Ao pk vel: 2.22 m/s
Area-P 1/2: 4.7 cm2
S' Lateral: 2.7 cm

## 2021-09-20 MED ORDER — LOSARTAN POTASSIUM 25 MG PO TABS: 25.0000 mg | ORAL_TABLET | Freq: Every day | ORAL | 1 refills | Status: AC

## 2021-09-20 MED ORDER — METOPROLOL SUCCINATE ER 25 MG PO TB24: 25.0000 mg | ORAL_TABLET | Freq: Every day | ORAL | 2 refills | Status: DC

## 2021-09-20 MED ORDER — ATORVASTATIN CALCIUM 40 MG PO TABS: 40.0000 mg | ORAL_TABLET | Freq: Every day | ORAL | 1 refills | Status: DC

## 2021-09-20 NOTE — Patient Instructions (Addendum)
Medication Instructions:  START Toprol XL '25mg'$  take 1 tablet daily *If you need a refill on your cardiac medications before your next appointment, please call your pharmacy*   Lab Work: None Ordered   Testing/Procedures: None Ordered   Follow-Up: At [facility], you and your health needs are our priority.  As part of our continuing mission to provide you with exceptional heart care, we have created designated Provider Care Teams.  These Care Teams include your primary Cardiologist (physician) and Advanced Practice Providers (APPs -  Physician Assistants and Nurse Practitioners) who all work together to provide you with the care you need, when you need it.  We recommend signing up for the patient portal called "MyChart".  Sign up information is provided on this After Visit Summary.  MyChart is used to connect with patients for Virtual Visits (Telemedicine).  Patients are able to view lab/test results, encounter notes, upcoming appointments, etc.  Non-urgent messages can be sent to your provider as well.   To learn more about what you can do with MyChart, go to [email or web address].    Your next appointment:   2 month(s)  The format for your next appointment:   In Person  Provider:   Melina Copa, PA-C        Other Instructions   Important Information About Sugar

## 2021-09-21 ENCOUNTER — Inpatient Hospital Stay: Payer: Medicare HMO | Admitting: Internal Medicine

## 2021-09-21 ENCOUNTER — Inpatient Hospital Stay: Payer: Medicare HMO

## 2021-09-21 ENCOUNTER — Other Ambulatory Visit: Payer: Self-pay | Admitting: Medical Oncology

## 2021-09-21 ENCOUNTER — Encounter: Payer: Self-pay | Admitting: Medical Oncology

## 2021-09-21 ENCOUNTER — Telehealth: Payer: Self-pay | Admitting: Internal Medicine

## 2021-09-21 ENCOUNTER — Encounter: Payer: Self-pay | Admitting: Internal Medicine

## 2021-09-21 ENCOUNTER — Other Ambulatory Visit: Payer: Self-pay

## 2021-09-21 VITALS — BP 129/76 | HR 86 | Temp 97.9°F | Resp 17

## 2021-09-21 VITALS — BP 130/70 | HR 86 | Temp 98.1°F | Resp 18 | Ht 72.0 in

## 2021-09-21 DIAGNOSIS — D701 Agranulocytosis secondary to cancer chemotherapy: Secondary | ICD-10-CM

## 2021-09-21 DIAGNOSIS — T451X5A Adverse effect of antineoplastic and immunosuppressive drugs, initial encounter: Secondary | ICD-10-CM | POA: Diagnosis not present

## 2021-09-21 DIAGNOSIS — D696 Thrombocytopenia, unspecified: Secondary | ICD-10-CM | POA: Diagnosis not present

## 2021-09-21 DIAGNOSIS — C8300 Small cell B-cell lymphoma, unspecified site: Secondary | ICD-10-CM

## 2021-09-21 DIAGNOSIS — D649 Anemia, unspecified: Secondary | ICD-10-CM | POA: Diagnosis not present

## 2021-09-21 DIAGNOSIS — Z5112 Encounter for antineoplastic immunotherapy: Secondary | ICD-10-CM | POA: Diagnosis not present

## 2021-09-21 DIAGNOSIS — Z79899 Other long term (current) drug therapy: Secondary | ICD-10-CM | POA: Diagnosis not present

## 2021-09-21 DIAGNOSIS — I878 Other specified disorders of veins: Secondary | ICD-10-CM

## 2021-09-21 DIAGNOSIS — D630 Anemia in neoplastic disease: Secondary | ICD-10-CM | POA: Diagnosis not present

## 2021-09-21 DIAGNOSIS — Z5111 Encounter for antineoplastic chemotherapy: Secondary | ICD-10-CM

## 2021-09-21 DIAGNOSIS — C8308 Small cell B-cell lymphoma, lymph nodes of multiple sites: Secondary | ICD-10-CM | POA: Diagnosis not present

## 2021-09-21 LAB — SAMPLE TO BLOOD BANK

## 2021-09-21 LAB — CBC WITH DIFFERENTIAL (CANCER CENTER ONLY)
Abs Immature Granulocytes: 0 10*3/uL (ref 0.00–0.07)
Basophils Absolute: 0 10*3/uL (ref 0.0–0.1)
Basophils Relative: 1 %
Eosinophils Absolute: 0 10*3/uL (ref 0.0–0.5)
Eosinophils Relative: 1 %
HCT: 23.5 % — ABNORMAL LOW (ref 39.0–52.0)
Hemoglobin: 7.8 g/dL — ABNORMAL LOW (ref 13.0–17.0)
Immature Granulocytes: 0 %
Lymphocytes Relative: 49 %
Lymphs Abs: 0.7 10*3/uL (ref 0.7–4.0)
MCH: 31.1 pg (ref 26.0–34.0)
MCHC: 33.2 g/dL (ref 30.0–36.0)
MCV: 93.6 fL (ref 80.0–100.0)
Monocytes Absolute: 0 10*3/uL — ABNORMAL LOW (ref 0.1–1.0)
Monocytes Relative: 1 %
Neutro Abs: 0.7 10*3/uL — ABNORMAL LOW (ref 1.7–7.7)
Neutrophils Relative %: 48 %
Platelet Count: 126 10*3/uL — ABNORMAL LOW (ref 150–400)
RBC: 2.51 MIL/uL — ABNORMAL LOW (ref 4.22–5.81)
RDW: 17.8 % — ABNORMAL HIGH (ref 11.5–15.5)
WBC Count: 1.5 10*3/uL — ABNORMAL LOW (ref 4.0–10.5)
nRBC: 1.4 % — ABNORMAL HIGH (ref 0.0–0.2)

## 2021-09-21 LAB — CMP (CANCER CENTER ONLY)
ALT: 22 U/L (ref 0–44)
AST: 18 U/L (ref 15–41)
Albumin: 3.7 g/dL (ref 3.5–5.0)
Alkaline Phosphatase: 79 U/L (ref 38–126)
Anion gap: 5 (ref 5–15)
BUN: 16 mg/dL (ref 8–23)
CO2: 27 mmol/L (ref 22–32)
Calcium: 9 mg/dL (ref 8.9–10.3)
Chloride: 104 mmol/L (ref 98–111)
Creatinine: 0.76 mg/dL (ref 0.61–1.24)
GFR, Estimated: >60 mL/min (ref >60)
Glucose, Bld: 143 mg/dL — ABNORMAL HIGH (ref 70–99)
Potassium: 3.8 mmol/L (ref 3.5–5.1)
Sodium: 136 mmol/L (ref 135–145)
Total Bilirubin: 0.5 mg/dL (ref 0.3–1.2)
Total Protein: 6.9 g/dL (ref 6.5–8.1)

## 2021-09-21 LAB — MAGNESIUM: Magnesium: 1.7 mg/dL (ref 1.7–2.4)

## 2021-09-21 LAB — URIC ACID: Uric Acid, Serum: 2.8 mg/dL — ABNORMAL LOW (ref 3.7–8.6)

## 2021-09-21 LAB — PHOSPHORUS: Phosphorus: 3.6 mg/dL (ref 2.5–4.6)

## 2021-09-21 MED ORDER — SODIUM CHLORIDE 0.9 % IV SOLN
1000.0000 mg | Freq: Once | INTRAVENOUS | Status: AC
  Administered 2021-09-21: 1000 mg via INTRAVENOUS
  Filled 2021-09-21: qty 40

## 2021-09-21 MED ORDER — DIPHENHYDRAMINE HCL 50 MG/ML IJ SOLN
50.0000 mg | Freq: Once | INTRAMUSCULAR | Status: AC
  Administered 2021-09-21: 50 mg via INTRAVENOUS
  Filled 2021-09-21: qty 1

## 2021-09-21 MED ORDER — SODIUM CHLORIDE 0.9 % IV SOLN: Freq: Once | INTRAVENOUS | Status: AC

## 2021-09-21 MED ORDER — SODIUM CHLORIDE 0.9 % IV SOLN
20.0000 mg | Freq: Once | INTRAVENOUS | Status: AC
  Administered 2021-09-21: 20 mg via INTRAVENOUS
  Filled 2021-09-21: qty 20

## 2021-09-21 MED ORDER — ACETAMINOPHEN 325 MG PO TABS
650.0000 mg | ORAL_TABLET | Freq: Once | ORAL | Status: AC
  Administered 2021-09-21: 650 mg via ORAL
  Filled 2021-09-21: qty 2

## 2021-09-21 MED ORDER — ALLOPURINOL 300 MG PO TABS: 300.0000 mg | ORAL_TABLET | Freq: Two times a day (BID) | ORAL | 0 refills | Status: DC

## 2021-09-21 MED ORDER — FAMOTIDINE IN NACL 20-0.9 MG/50ML-% IV SOLN
20.0000 mg | Freq: Once | INTRAVENOUS | Status: AC
  Administered 2021-09-21: 20 mg via INTRAVENOUS
  Filled 2021-09-21: qty 50

## 2021-09-21 MED ORDER — METHYLPREDNISOLONE SODIUM SUCC 125 MG IJ SOLR
60.0000 mg | Freq: Once | INTRAMUSCULAR | Status: AC
  Administered 2021-09-21: 60 mg via INTRAVENOUS
  Filled 2021-09-21: qty 2

## 2021-09-21 NOTE — Telephone Encounter (Signed)
Scheduler per 08/23 los, patient has been called and notified.

## 2021-09-21 NOTE — Patient Instructions (Addendum)
[facility]  Discharge Instructions: Thank you for choosing [facility] to provide your oncology and hematology care.   If you have a lab appointment with the [facility], please go directly to the B and E and check in at the registration area.   Wear comfortable clothing and clothing appropriate for easy access to any Portacath or PICC line.   We strive to give you quality time with your provider. You may need to reschedule your appointment if you arrive late (15 or more minutes).  Arriving late affects you and other patients whose appointments are after yours.  Also, if you miss three or more appointments without notifying the office, you may be dismissed from the clinic at the provider's discretion.      For prescription refill requests, have your pharmacy contact our office and allow 72 hours for refills to be completed.    Today you received the following chemotherapy and/or immunotherapy agents : Gazyva      To help prevent nausea and vomiting after your treatment, we encourage you to take your nausea medication as directed.  BELOW ARE SYMPTOMS THAT SHOULD BE REPORTED IMMEDIATELY: *FEVER GREATER THAN 100.4 F (38 C) OR HIGHER *CHILLS OR SWEATING *NAUSEA AND VOMITING THAT IS NOT CONTROLLED WITH YOUR NAUSEA MEDICATION *UNUSUAL SHORTNESS OF BREATH *UNUSUAL BRUISING OR BLEEDING *URINARY PROBLEMS (pain or burning when urinating, or frequent urination) *BOWEL PROBLEMS (unusual diarrhea, constipation, pain near the anus) TENDERNESS IN MOUTH AND THROAT WITH OR WITHOUT PRESENCE OF ULCERS (sore throat, sores in mouth, or a toothache) UNUSUAL RASH, SWELLING OR PAIN  UNUSUAL VAGINAL DISCHARGE OR ITCHING   Items with * indicate a potential emergency and should be followed up as soon as possible or go to the Emergency Department if any problems should occur.  Please show the CHEMOTHERAPY ALERT CARD or IMMUNOTHERAPY ALERT CARD at check-in to  the Emergency Department and triage nurse.  Should you have questions after your visit or need to cancel or reschedule your appointment, please contact [facility]  Dept: [phone]  and follow the prompts.  Office hours are 8:00 a.m. to 4:30 p.m. Monday - Friday. Please note that voicemails left after 4:00 p.m. may not be returned until the following business day.  We are closed weekends and major holidays. You have access to a nurse at all times for urgent questions. Please call the main number to the clinic Dept: [phone] and follow the prompts.   For any non-urgent questions, you may also contact your provider using MyChart. We now offer e-Visits for anyone 38 and older to request care online for non-urgent symptoms. For details visit [email or web address].   Also download the MyChart app! Go to the app store, search "MyChart", open the app, select [facility], and log in with your MyChart username and password.  Masks are optional in the cancer centers. If you would like for your care team to wear a mask while they are taking care of you, please let them know. You may have one support person who is at least 79 years old accompany you for your appointments. Implanted Port Insertion Implanted port insertion is a procedure to put in a port and catheter. The port is a device with an injectable disc that can be accessed by your health care provider. The port is connected to a vein in the chest or neck by a small, thin tube (catheter). There are different types of ports. The implanted port  may be used as a long-term IV access for: Medicines, such as chemotherapy. Fluids. Liquid nutrition, such as total parenteral nutrition (TPN). When you have a port, your health care provider can choose to use the port instead of veins in your arms for these procedures. Tell a health care provider about: Any allergies you have. All medicines you are taking, especially blood  thinners, as well as any vitamins, herbs, eye drops, creams, over-the-counter medicines, and steroids. Any problems you or family members have had with anesthetic medicines. Any bleeding problems you have. Any surgeries you have had. Any medical conditions you have or have had, including diabetes or kidney problems. Whether you are pregnant or may be pregnant. What are the risks? Generally, this is a safe procedure. However, problems may occur, including: Allergic reactions to medicines or dyes. Damage to other structures or organs. Infection. Damage to the blood vessel, bruising, or bleeding at the puncture site. Blood clot. Breakdown of the skin over the port. A collection of air in the chest that can cause one of the lungs to collapse (pneumothorax). This is rare. What happens before the procedure? When to stop eating and drinking Follow instructions from your health care provider about what you may eat and drink before your procedure. These may include: 8 hours before your procedure Stop eating most foods. Do not eat meat, fried foods, or fatty foods. Eat only light foods, such as toast or crackers. All liquids are okay except energy drinks and alcohol. 6 hours before your procedure Stop eating. Drink only clear liquids, such as water, clear fruit juice, black coffee, plain tea, and sports drinks. Do not drink energy drinks or alcohol. 2 hours before your procedure Stop drinking all liquids. You may be allowed to take medicines with small sips of water. If you do not follow your health care provider's instructions, your procedure may be delayed or canceled. Medicines Ask your health care provider about: Changing or stopping your regular medicines. This is especially important if you are taking diabetes medicines or blood thinners. Taking medicines such as aspirin and ibuprofen. These medicines can thin your blood. Do not take these medicines unless your health care provider tells  you to take them. Taking over-the-counter medicines, vitamins, herbs, and supplements. General instructions If you will be going home right after the procedure, plan to have a responsible adult: Take you home from the hospital or clinic. You will not be allowed to drive. Care for you for the time you are told. You may have blood tests. Do not use any products that contain nicotine or tobacco for at least 4 weeks before the procedure. These products include cigarettes, chewing tobacco, and vaping devices, such as e-cigarettes. If you need help quitting, ask your health care provider. Ask your health care provider what steps will be taken to help prevent infection. These may include: Removing hair at the surgery site. Washing skin with a germ-killing soap. Taking antibiotic medicine. What happens during the procedure?  An IV will be inserted into one of your veins. You will be given one or more of the following: A medicine to help you relax (sedative). A medicine to numb the area (local anesthetic). Two small incisions will be made to insert the port. One smaller incision will be made in your neck to get access to the vein where the catheter will lie. The other incision will be made in the upper chest. This is where the port will lie. The procedure may be done using  continuous X-ray (fluoroscopy) or other imaging tools for guidance. The port and catheter will be placed. There may be a small, raised area where the port is placed. The port will be flushed with a saline solution, which is made of salt and water, and blood will be drawn to make sure that the port is working correctly. The incisions will be closed. Bandages (dressings) may be placed over the incisions. The procedure may vary among health care providers and hospitals. What happens after the procedure? Your blood pressure, heart rate, breathing rate, and blood oxygen level will be monitored until you leave the hospital or  clinic. If you were given a sedative during the procedure, it can affect you for several hours. Do not drive or operate machinery until your health care provider says that it is safe. You will be given a manufacturer's information card for the type of port that you have. Keep this with you. Your port will need to be flushed and checked as told by your health care provider, usually every few weeks. A chest X-ray will be done to: Check the placement of the port. Make sure there is no injury to your lung. Summary Implanted port insertion is a procedure to put in a port and catheter. The implanted port is used as a long-term IV access. The port will need to be flushed and checked as told by your health care provider, usually every few weeks. Keep your manufacturer's information card with you at all times. This information is not intended to replace advice given to you by your health care provider. Make sure you discuss any questions you have with your health care provider. Document Revised: 07/20/2020 Document Reviewed: 07/20/2020 Elsevier Patient Education  [place].

## 2021-09-21 NOTE — Progress Notes (Signed)
Per Dr. Julien Nordmann ,it is okay to treat pt today with Obinutuzumab and WBC of 1.5 and ANC of 700.

## 2021-09-21 NOTE — Progress Notes (Signed)
Central [phone]   [phone]) [phone]  OFFICE PROGRESS NOTE  [name], MD 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  PRIOR THERAPY: A tapered dose of prednisone but his anemia and thrombocytopenia were refractory to the steroids.  CURRENT THERAPY: Systemic treatment with obinutuzumab every 4 weeks for 6 months.  In addition the patient will be treatment with venetoclax initially with a starting dose to be increased gradually to the standard dose of 400 mg p.o. daily for a total of 1 year.  He is status post day 1 of cycle #1.  INTERVAL HISTORY: Joseph Moyer 81 y.o. male returns to the clinic today for follow-up visit.  The patient is feeling fine today with no concerning complaints except for mild fatigue.  He also has a bowel movement with straining and lost some blood.  He denied having any chest pain, shortness of breath, cough or hemoptysis.  He has no nausea, vomiting, diarrhea or constipation.  He has no headache or visual changes.  He denied having any weight loss or night sweats.  He is here today for evaluation before starting day 8 of cycle #1.  MEDICAL HISTORY: Past Medical History:  Diagnosis Date   Allergy    Arthritis    CAD in native artery    COPD (chronic obstructive pulmonary disease) (HCC)    GERD (gastroesophageal reflux disease)    Hyperlipidemia    Hypertension    Mild carotid artery disease (HCC)    Small lymphocytic lymphoma (HCC)     ALLERGIES:  is allergic to obinutuzumab, oxycodone, ramipril, and rosuvastatin.  MEDICATIONS:  Current Outpatient Medications  Medication Sig Dispense Refill   allopurinol (ZYLOPRIM) 100 MG tablet Take 3 tablets (300 mg total) by mouth 2 (two) times daily. 60 tablet 3   amLODipine (NORVASC) 5  MG tablet Take 5 mg by mouth daily.     atorvastatin (LIPITOR) 40 MG tablet Take 1 tablet (40 mg total) by mouth daily. 90 tablet 1   diphenhydrAMINE HCl, Sleep, 50 MG CAPS Take 50 mg by mouth at bedtime.     FeFum-FePoly-FA-B Cmp-C-Biot (INTEGRA PLUS) CAPS Take 1 capsule by mouth daily. 30 capsule 3   losartan (COZAAR) 25 MG tablet Take 1 tablet (25 mg total) by mouth daily. 90 tablet 1   meloxicam (MOBIC) 15 MG tablet Take 15 mg by mouth daily as needed.     methocarbamol (ROBAXIN) 750 MG tablet Take 750 mg by mouth every 6 (six) hours as needed.     metoprolol succinate (TOPROL XL) 25 MG 24 hr tablet Take 1 tablet (25 mg total) by mouth daily. 30 tablet 2   Naphazoline-Glycerin (CLEAR EYES REDNESS RELIEF OP) Place 1 drop into both eyes as needed.     omeprazole (PRILOSEC) 20 MG capsule TAKE 1 CAPSULE BY MOUTH EVERY DAY (Patient taking differently: Take by mouth as needed.) 30 capsule 1   OVER THE COUNTER MEDICATION Apply 1 application topically daily as needed (pain). Hemp cream     prochlorperazine (COMPAZINE) 10 MG tablet Take 1 tablet (10 mg total) by mouth every 6 (six) hours as needed for nausea or vomiting. 30 tablet 0   tamsulosin (FLOMAX) 0.4 MG CAPS capsule Take 0.4 mg by mouth daily.     umeclidinium bromide (INCRUSE ELLIPTA) 62.5 MCG/INH AEPB Inhale  1 puff into the lungs daily. 60 each 5   venetoclax (VENCLEXTA) 10 & 50 & 100 MG Starter Pack Take 20 mg by mouth daily for 7 days, then 50 mg daily for 7 days, then 100 mg daily for 7 days, then 200 mg daily for 7 days. Take with food & water. 42 tablet 0   No current facility-administered medications for this visit.    SURGICAL HISTORY:  Past Surgical History:  Procedure Laterality Date   BRONCHIAL NEEDLE ASPIRATION BIOPSY  03/12/2020   Procedure: BRONCHIAL NEEDLE ASPIRATION BIOPSIES;  Surgeon: Garner Nash, DO;  Location: [facility];  Service: Pulmonary;;   CIRCUMCISION     COLONOSCOPY WITH PROPOFOL N/A 05/10/2015    Procedure: COLONOSCOPY WITH PROPOFOL;  Surgeon: Garlan Fair, MD;  Location: [facility];  Service: Endoscopy;  Laterality: N/A;   DECOMPRESSIVE LUMBAR LAMINECTOMY LEVEL 1 N/A 06/15/2018   Procedure: Gordy Levan decompression L1-L2/ complete inferior facetectomyof L1. Posterior lateral arthrodesiswith autograft bone L1-L2.;  Surgeon: Melina Schools, MD;  Location: [facility];  Service: Orthopedics;  Laterality: N/A;   HEMORRHOID SURGERY     VIDEO BRONCHOSCOPY WITH ENDOBRONCHIAL ULTRASOUND N/A 03/12/2020   Procedure: VIDEO BRONCHOSCOPY WITH ENDOBRONCHIAL ULTRASOUND;  Surgeon: Garner Nash, DO;  Location: [facility];  Service: Pulmonary;  Laterality: N/A;    REVIEW OF SYSTEMS:  Constitutional: positive for fatigue Eyes: negative Ears, nose, mouth, throat, and face: negative Respiratory: negative Cardiovascular: negative Gastrointestinal: positive for rectal bleeding secondary to hemorrhoids Genitourinary:negative Integument/breast: negative Hematologic/lymphatic: negative Musculoskeletal:negative Neurological: negative Behavioral/Psych: negative Endocrine: negative Allergic/Immunologic: negative   PHYSICAL EXAMINATION: General appearance: alert, cooperative, fatigued, and no distress Head: Normocephalic, without obvious abnormality, atraumatic Neck: no adenopathy, no JVD, supple, symmetrical, trachea midline, and thyroid not enlarged, symmetric, no tenderness/mass/nodules Lymph nodes: Cervical, supraclavicular, and axillary nodes normal. Resp: clear to auscultation bilaterally Back: symmetric, no curvature. ROM normal. No CVA tenderness. Cardio: regular rate and rhythm, S1, S2 normal, no murmur, click, rub or gallop GI: soft, non-tender; bowel sounds normal; no masses,  no organomegaly Extremities: extremities normal, atraumatic, no cyanosis or edema Neurologic: Alert and oriented X 3, normal strength and tone. Normal symmetric reflexes. Normal coordination and gait  ECOG PERFORMANCE  STATUS: 1 - Symptomatic but completely ambulatory  Blood pressure 130/70, pulse 86, temperature 98.1 F (36.7 C), temperature source Oral, resp. rate 18, height 6' (1.829 m), SpO2 100 %.  LABORATORY DATA: Lab Results  Component Value Date   WBC 1.5 (L) 09/21/2021   HGB 7.8 (L) 09/21/2021   HCT 23.5 (L) 09/21/2021   MCV 93.6 09/21/2021   PLT 126 (L) 09/21/2021      Chemistry      Component Value Date/Time   NA 134 (L) 09/14/2021 0800   NA 136 09/05/2021 1041   K 4.2 09/14/2021 0800   CL 101 09/14/2021 0800   CO2 27 09/14/2021 0800   BUN 21 09/14/2021 0800   BUN 15 09/05/2021 1041   CREATININE 0.93 09/14/2021 0800      Component Value Date/Time   CALCIUM 9.2 09/14/2021 0800   ALKPHOS 76 09/14/2021 0800   AST 23 09/14/2021 0800   ALT 33 09/14/2021 0800   BILITOT 0.5 09/14/2021 0800       RADIOGRAPHIC STUDIES: ECHOCARDIOGRAM COMPLETE  Result Date: 09/20/2021    ECHOCARDIOGRAM REPORT   Patient Name:   GRIFFIN GERRARD [facility] Date of Exam: 09/20/2021 Medical Rec #:  [id number]         Height:  71.5 in Accession #:    [id number]        Weight:       206.5 lb Date of Birth:  [birth year]        BSA:          2.149 m Patient Age:    37 years          BP:           115/52 mmHg Patient Gender: M                 HR:           101 bpm. Exam Location:  [facility] Procedure: 2D Echo, 3D Echo, Cardiac Doppler, Color Doppler and Strain Analysis Indications:    I25.10 Coronary artery disease.                 R00.0 Sinus Tachycardia  History:        Patient has prior history of Echocardiogram examinations, most                 recent 01/20/2020. CAD, COPD; Risk Factors:Hypertension and                 Dyslipidemia. Carotid Artery disease.  Sonographer:    Wilford Sports Rodgers-Jones RDCS Referring Phys: 70 [place]  1. Left ventricular ejection fraction, by estimation, is 55 to 60%. Left ventricular ejection fraction by 3D volume is 55 %. The left ventricle has normal function. The  left ventricle has no regional wall motion abnormalities. Left ventricular diastolic  parameters are consistent with Grade I diastolic dysfunction (impaired relaxation). The average left ventricular global longitudinal strain is -21.0 %. The global longitudinal strain is normal.  2. Right ventricular systolic function is normal. The right ventricular size is normal.  3. The mitral valve is normal in structure. No evidence of mitral valve regurgitation. No evidence of mitral stenosis.  4. The aortic valve is tricuspid. Aortic valve regurgitation is not visualized. No aortic stenosis is present. Aortic valve area, by VTI measures 1.82 cm. Aortic valve mean gradient measures 10.6 mmHg. Aortic valve Vmax measures 2.22 m/s.  5. The inferior vena cava is normal in size with greater than 50% respiratory variability, suggesting right atrial pressure of 3 mmHg. FINDINGS  Left Ventricle: Left ventricular ejection fraction, by estimation, is 55 to 60%. Left ventricular ejection fraction by 3D volume is 55 %. The left ventricle has normal function. The left ventricle has no regional wall motion abnormalities. The average left ventricular global longitudinal strain is -21.0 %. The global longitudinal strain is normal. The left ventricular internal cavity size was normal in size. There is no left ventricular hypertrophy. Left ventricular diastolic parameters are consistent  with Grade I diastolic dysfunction (impaired relaxation). Normal left ventricular filling pressure. Right Ventricle: The right ventricular size is normal. No increase in right ventricular wall thickness. Right ventricular systolic function is normal. Left Atrium: Left atrial size was normal in size. Right Atrium: Right atrial size was normal in size. Pericardium: There is no evidence of pericardial effusion. Mitral Valve: The mitral valve is normal in structure. No evidence of mitral valve regurgitation. No evidence of mitral valve stenosis. Tricuspid Valve:  The tricuspid valve is normal in structure. Tricuspid valve regurgitation is not demonstrated. No evidence of tricuspid stenosis. Aortic Valve: The aortic valve is tricuspid. Aortic valve regurgitation is not visualized. No aortic stenosis is present. Aortic valve mean gradient measures 10.6 mmHg. Aortic valve peak gradient  measures 19.6 mmHg. Aortic valve area, by VTI measures 1.82 cm. Pulmonic Valve: The pulmonic valve was normal in structure. Pulmonic valve regurgitation is not visualized. No evidence of pulmonic stenosis. Aorta: The aortic root is normal in size and structure. Venous: The inferior vena cava is normal in size with greater than 50% respiratory variability, suggesting right atrial pressure of 3 mmHg. IAS/Shunts: No atrial level shunt detected by color flow Doppler.  LEFT VENTRICLE PLAX 2D LVIDd:         4.10 cm         Diastology LVIDs:         2.70 cm         LV e' medial:    7.07 cm/s LV PW:         1.00 cm         LV E/e' medial:  11.9 LV IVS:        1.00 cm         LV e' lateral:   9.57 cm/s LVOT diam:     2.10 cm         LV E/e' lateral: 8.8 LV SV:         62 LV SV Index:   29              2D LVOT Area:     3.46 cm        Longitudinal                                Strain                                2D Strain GLS  -20.8 %                                (A2C):                                2D Strain GLS  -20.8 %                                (A3C):                                2D Strain GLS  -21.3 %                                (A4C):                                2D Strain GLS  -21.0 %                                Avg:                                 3D Volume EF  LV 3D EF:    Left                                             ventricul                                             ar                                             ejection                                             fraction                                             by 3D                                              volume is                                             55 %.                                 3D Volume EF:                                3D EF:        55 %                                LV EDV:       86 ml                                LV ESV:       39 ml                                LV SV:        47 ml RIGHT VENTRICLE             IVC RV Basal diam:  3.80 cm     IVC diam: 0.80 cm RV S prime:     17.97 cm/s TAPSE (M-mode): 1.9 cm LEFT ATRIUM             Index        RIGHT ATRIUM  Index LA diam:        4.20 cm 1.95 cm/m   RA Area:     10.50 cm LA Vol (A2C):   43.4 ml 20.20 ml/m  RA Volume:   24.40 ml  11.36 ml/m LA Vol (A4C):   37.5 ml 17.45 ml/m LA Biplane Vol: 41.8 ml 19.45 ml/m  AORTIC VALVE AV Area (Vmax):    1.73 cm AV Area (Vmean):   1.68 cm AV Area (VTI):     1.82 cm AV Vmax:           221.60 cm/s AV Vmean:          151.600 cm/s AV VTI:            0.342 m AV Peak Grad:      19.6 mmHg AV Mean Grad:      10.6 mmHg LVOT Vmax:         111.00 cm/s LVOT Vmean:        73.667 cm/s LVOT VTI:          0.179 m LVOT/AV VTI ratio: 0.52  AORTA Ao Root diam: 3.10 cm Ao Asc diam:  3.20 cm MITRAL VALVE MV Area (PHT): 4.70 cm     SHUNTS MV Decel Time: 162 msec     Systemic VTI:  0.18 m MV E velocity: 84.10 cm/s   Systemic Diam: 2.10 cm MV A velocity: 118.50 cm/s MV E/A ratio:  0.71 Fransico Him MD Electronically signed by Fransico Him MD Signature Date/Time: 09/20/2021/12:20:03 PM    Final      ASSESSMENT AND PLAN: This is a very pleasant 81 years old African-American male recently diagnosed with a small lymphocytic lymphoma with generalized lymphadenopathy in the neck, chest, axilla, abdomen and pelvis. The core biopsy of the left inguinal lymph node was consistent with the above diagnosis. The patient was seen recently with worsening anemia and thrombocytopenia.  He has imaging studies that showed mildly progressive lymphadenopathy in the right axilla, porta hepatis,  retroperitoneum and pelvis but not the mediastinal lymphadenopathy. The patient was treated with a tapered dose of prednisone with no improvement in his anemia and thrombocytopenia. I had a lengthy discussion with the patient today about his current condition and treatment options.  The patient definitely has indication now for treatment of his condition especially with the persistent refractory anemia and thrombocytopenia. The patient is currently on treatment with obinutuzumab and venetoclax started a week ago.  He has been tolerating this treatment well with no concerning adverse effect except for the chemotherapy-induced neutropenia. I recommended for him to proceed with day 8 of cycle #1 today with obinutuzumab but because of the chemotherapy-induced neutropenia, I will arrange for the patient to receive filgrastim injection 480 Mcg subcutaneously daily for 3 days starting tomorrow.  I also strongly advised the patient to go immediately to the emergency department if he develop any fever or chills. He will come back for follow-up visit in around 3 weeks for evaluation before starting day 1 of cycle #2. For the tumor lysis, we will continue to monitor his lab work closely and the patient will continue on his current treatment with allopurinol and I will give him refill of his prescription. He was advised to call immediately if he has any other concerning symptoms in the interval. The patient voices understanding of current disease status and treatment options and is in agreement with the current care plan.  All questions were answered. The patient knows to call the clinic with  any problems, questions or concerns. We can certainly see the patient much sooner if necessary.  The total time spent in the appointment was 30 minutes.  Disclaimer: This note was dictated with voice recognition software. Similar sounding words can inadvertently be transcribed and may not be corrected upon review.

## 2021-09-22 ENCOUNTER — Other Ambulatory Visit: Payer: Self-pay | Admitting: Medical Oncology

## 2021-09-22 ENCOUNTER — Other Ambulatory Visit: Payer: Self-pay

## 2021-09-22 ENCOUNTER — Inpatient Hospital Stay: Payer: Medicare HMO

## 2021-09-22 VITALS — BP 127/75 | HR 92 | Temp 98.0°F | Resp 18

## 2021-09-22 DIAGNOSIS — Z95828 Presence of other vascular implants and grafts: Secondary | ICD-10-CM

## 2021-09-22 DIAGNOSIS — T451X5A Adverse effect of antineoplastic and immunosuppressive drugs, initial encounter: Secondary | ICD-10-CM

## 2021-09-22 DIAGNOSIS — C8308 Small cell B-cell lymphoma, lymph nodes of multiple sites: Secondary | ICD-10-CM | POA: Diagnosis not present

## 2021-09-22 DIAGNOSIS — Z79899 Other long term (current) drug therapy: Secondary | ICD-10-CM | POA: Diagnosis not present

## 2021-09-22 DIAGNOSIS — D701 Agranulocytosis secondary to cancer chemotherapy: Secondary | ICD-10-CM | POA: Diagnosis not present

## 2021-09-22 DIAGNOSIS — D649 Anemia, unspecified: Secondary | ICD-10-CM | POA: Diagnosis not present

## 2021-09-22 DIAGNOSIS — D630 Anemia in neoplastic disease: Secondary | ICD-10-CM | POA: Diagnosis not present

## 2021-09-22 DIAGNOSIS — Z5112 Encounter for antineoplastic immunotherapy: Secondary | ICD-10-CM | POA: Diagnosis not present

## 2021-09-22 DIAGNOSIS — I878 Other specified disorders of veins: Secondary | ICD-10-CM | POA: Diagnosis not present

## 2021-09-22 DIAGNOSIS — D696 Thrombocytopenia, unspecified: Secondary | ICD-10-CM | POA: Diagnosis not present

## 2021-09-22 DIAGNOSIS — C8300 Small cell B-cell lymphoma, unspecified site: Secondary | ICD-10-CM

## 2021-09-22 MED ORDER — LIDOCAINE-PRILOCAINE 2.5-2.5 % EX CREA: 1.0000 | TOPICAL_CREAM | CUTANEOUS | 0 refills | Status: DC | PRN

## 2021-09-22 MED ORDER — FILGRASTIM-AAFI 480 MCG/0.8ML IJ SOSY: 480.0000 ug | PREFILLED_SYRINGE | Freq: Every day | INTRAMUSCULAR | Status: DC

## 2021-09-22 MED ORDER — FILGRASTIM-AAFI 480 MCG/0.8ML IJ SOSY
480.0000 ug | PREFILLED_SYRINGE | Freq: Once | INTRAMUSCULAR | Status: AC
  Administered 2021-09-22: 480 ug via SUBCUTANEOUS
  Filled 2021-09-22: qty 0.8

## 2021-09-22 NOTE — Patient Instructions (Signed)
Filgrastim Injection What is this medication? FILGRASTIM (fil GRA stim) lowers the risk of infection in people who are receiving chemotherapy. It works by helping your body make more white blood cells, which protects your body from infection. It may also be used to help people who have been exposed to high doses of radiation. It can be used to help prepare your body before a stem cell transplant. It works by helping your bone marrow make and release stem cells into the blood. This medicine may be used for other purposes; ask your health care provider or pharmacist if you have questions. COMMON BRAND NAME(S): Neupogen, Nivestym, Releuko, Zarxio What should I tell my care team before I take this medication? They need to know if you have any of these conditions: History of blood diseases, such as sickle cell anemia Kidney disease Recent or ongoing radiation An unusual or allergic reaction to filgrastim, pegfilgrastim, latex, rubber, other medications, foods, dyes, or preservatives Pregnant or trying to get pregnant Breast-feeding How should I use this medication? This medication is injected under the skin or into a vein. It is usually given by your care team in a hospital or clinic setting. It may be given at home. If you get this medication at home, you will be taught how to prepare and give it. Use exactly as directed. Take it as directed on the prescription label at the same time every day. Keep taking it unless your care team tells you to stop. It is important that you put your used needles and syringes in a special sharps container. Do not put them in a trash can. If you do not have a sharps container, call your pharmacist or care team to get one. This medication comes with INSTRUCTIONS FOR USE. Ask your pharmacist for directions on how to use this medication. Read the information carefully. Talk to your pharmacist or care team if you have questions. Talk to your care team about the use of this  medication in children. While it may be prescribed for children for selected conditions, precautions do apply. Overdosage: If you think you have taken too much of this medicine contact a poison control center or emergency room at once. NOTE: This medicine is only for you. Do not share this medicine with others. What if I miss a dose? It is important not to miss any doses. Talk to your care team about what to do if you miss a dose. What may interact with this medication? Medications that may cause a release of neutrophils, such as lithium This list may not describe all possible interactions. Give your health care provider a list of all the medicines, herbs, non-prescription drugs, or dietary supplements you use. Also tell them if you smoke, drink alcohol, or use illegal drugs. Some items may interact with your medicine. What should I watch for while using this medication? Your condition will be monitored carefully while you are receiving this medication. You may need bloodwork while taking this medication. Talk to your care team about your risk of cancer. You may be more at risk for certain types of cancer if you take this medication. What side effects may I notice from receiving this medication? Side effects that you should report to your care team as soon as possible: Allergic reactions--skin rash, itching, hives, swelling of the face, lips, tongue, or throat Capillary leak syndrome--stomach or muscle pain, unusual weakness or fatigue, feeling faint or lightheaded, decrease in the amount of urine, swelling of the ankles, hands, or   feet, trouble breathing High white blood cell level--fever, fatigue, trouble breathing, night sweats, change in vision, weight loss Inflammation of the aorta--fever, fatigue, back, chest, or stomach pain, severe headache Kidney injury (glomerulonephritis)--decrease in the amount of urine, red or dark brown urine, foamy or bubbly urine, swelling of the ankles, hands, or  feet Shortness of breath or trouble breathing Spleen injury--pain in upper left stomach or shoulder Unusual bruising or bleeding Side effects that usually do not require medical attention (report to your care team if they continue or are bothersome): Back pain Bone pain Fatigue Fever Headache Nausea This list may not describe all possible side effects. Call your doctor for medical advice about side effects. You may report side effects to FDA at 1-800-FDA-1088. Where should I keep my medication? Keep out of the reach of children and pets. Keep this medication in the original packaging until you are ready to take it. Protect from light. See product for storage information. Each product may have different instructions. Get rid of any unused medication after the expiration date. To get rid of medications that are no longer needed or have expired: Take the medication to a medications take-back program. Check with your pharmacy or law enforcement to find a location. If you cannot return the medication, ask your pharmacist or care team how to get rid of this medication safely. NOTE: This sheet is a summary. It may not cover all possible information. If you have questions about this medicine, talk to your doctor, pharmacist, or health care provider.  2023 Elsevier/Gold Standard (2021-04-26 00:00:00)  

## 2021-09-23 ENCOUNTER — Other Ambulatory Visit: Payer: Self-pay

## 2021-09-23 ENCOUNTER — Inpatient Hospital Stay: Payer: Medicare HMO

## 2021-09-23 VITALS — BP 116/66 | HR 88 | Temp 98.0°F | Resp 18

## 2021-09-23 DIAGNOSIS — D630 Anemia in neoplastic disease: Secondary | ICD-10-CM | POA: Diagnosis not present

## 2021-09-23 DIAGNOSIS — I878 Other specified disorders of veins: Secondary | ICD-10-CM | POA: Diagnosis not present

## 2021-09-23 DIAGNOSIS — T451X5A Adverse effect of antineoplastic and immunosuppressive drugs, initial encounter: Secondary | ICD-10-CM

## 2021-09-23 DIAGNOSIS — Z79899 Other long term (current) drug therapy: Secondary | ICD-10-CM | POA: Diagnosis not present

## 2021-09-23 DIAGNOSIS — C8308 Small cell B-cell lymphoma, lymph nodes of multiple sites: Secondary | ICD-10-CM | POA: Diagnosis not present

## 2021-09-23 DIAGNOSIS — D696 Thrombocytopenia, unspecified: Secondary | ICD-10-CM | POA: Diagnosis not present

## 2021-09-23 DIAGNOSIS — Z5112 Encounter for antineoplastic immunotherapy: Secondary | ICD-10-CM | POA: Diagnosis not present

## 2021-09-23 DIAGNOSIS — C8300 Small cell B-cell lymphoma, unspecified site: Secondary | ICD-10-CM

## 2021-09-23 DIAGNOSIS — D701 Agranulocytosis secondary to cancer chemotherapy: Secondary | ICD-10-CM | POA: Diagnosis not present

## 2021-09-23 DIAGNOSIS — D649 Anemia, unspecified: Secondary | ICD-10-CM | POA: Diagnosis not present

## 2021-09-23 MED ORDER — FILGRASTIM-AAFI 480 MCG/0.8ML IJ SOSY
480.0000 ug | PREFILLED_SYRINGE | Freq: Once | INTRAMUSCULAR | Status: AC
  Administered 2021-09-23: 480 ug via SUBCUTANEOUS
  Filled 2021-09-23: qty 0.8

## 2021-09-24 ENCOUNTER — Inpatient Hospital Stay: Payer: Medicare HMO

## 2021-09-24 VITALS — BP 114/57 | HR 93 | Temp 98.0°F | Resp 18

## 2021-09-24 DIAGNOSIS — C8300 Small cell B-cell lymphoma, unspecified site: Secondary | ICD-10-CM

## 2021-09-24 DIAGNOSIS — D696 Thrombocytopenia, unspecified: Secondary | ICD-10-CM | POA: Diagnosis not present

## 2021-09-24 DIAGNOSIS — Z5112 Encounter for antineoplastic immunotherapy: Secondary | ICD-10-CM | POA: Diagnosis not present

## 2021-09-24 DIAGNOSIS — I878 Other specified disorders of veins: Secondary | ICD-10-CM | POA: Diagnosis not present

## 2021-09-24 DIAGNOSIS — Z79899 Other long term (current) drug therapy: Secondary | ICD-10-CM | POA: Diagnosis not present

## 2021-09-24 DIAGNOSIS — D630 Anemia in neoplastic disease: Secondary | ICD-10-CM | POA: Diagnosis not present

## 2021-09-24 DIAGNOSIS — T451X5A Adverse effect of antineoplastic and immunosuppressive drugs, initial encounter: Secondary | ICD-10-CM | POA: Diagnosis not present

## 2021-09-24 DIAGNOSIS — D649 Anemia, unspecified: Secondary | ICD-10-CM | POA: Diagnosis not present

## 2021-09-24 DIAGNOSIS — C8308 Small cell B-cell lymphoma, lymph nodes of multiple sites: Secondary | ICD-10-CM | POA: Diagnosis not present

## 2021-09-24 DIAGNOSIS — D701 Agranulocytosis secondary to cancer chemotherapy: Secondary | ICD-10-CM | POA: Diagnosis not present

## 2021-09-24 MED ORDER — FILGRASTIM-AAFI 480 MCG/0.8ML IJ SOSY
480.0000 ug | PREFILLED_SYRINGE | Freq: Once | INTRAMUSCULAR | Status: AC
  Administered 2021-09-24: 480 ug via SUBCUTANEOUS
  Filled 2021-09-24: qty 0.8

## 2021-09-24 NOTE — Patient Instructions (Signed)
Filgrastim Injection What is this medication? FILGRASTIM (fil GRA stim) lowers the risk of infection in people who are receiving chemotherapy. It works by helping your body make more white blood cells, which protects your body from infection. It may also be used to help people who have been exposed to high doses of radiation. It can be used to help prepare your body before a stem cell transplant. It works by helping your bone marrow make and release stem cells into the blood. This medicine may be used for other purposes; ask your health care provider or pharmacist if you have questions. COMMON BRAND NAME(S): Neupogen, Nivestym, Releuko, Zarxio What should I tell my care team before I take this medication? They need to know if you have any of these conditions: History of blood diseases, such as sickle cell anemia Kidney disease Recent or ongoing radiation An unusual or allergic reaction to filgrastim, pegfilgrastim, latex, rubber, other medications, foods, dyes, or preservatives Pregnant or trying to get pregnant Breast-feeding How should I use this medication? This medication is injected under the skin or into a vein. It is usually given by your care team in a hospital or clinic setting. It may be given at home. If you get this medication at home, you will be taught how to prepare and give it. Use exactly as directed. Take it as directed on the prescription label at the same time every day. Keep taking it unless your care team tells you to stop. It is important that you put your used needles and syringes in a special sharps container. Do not put them in a trash can. If you do not have a sharps container, call your pharmacist or care team to get one. This medication comes with INSTRUCTIONS FOR USE. Ask your pharmacist for directions on how to use this medication. Read the information carefully. Talk to your pharmacist or care team if you have questions. Talk to your care team about the use of this  medication in children. While it may be prescribed for children for selected conditions, precautions do apply. Overdosage: If you think you have taken too much of this medicine contact a poison control center or emergency room at once. NOTE: This medicine is only for you. Do not share this medicine with others. What if I miss a dose? It is important not to miss any doses. Talk to your care team about what to do if you miss a dose. What may interact with this medication? Medications that may cause a release of neutrophils, such as lithium This list may not describe all possible interactions. Give your health care provider a list of all the medicines, herbs, non-prescription drugs, or dietary supplements you use. Also tell them if you smoke, drink alcohol, or use illegal drugs. Some items may interact with your medicine. What should I watch for while using this medication? Your condition will be monitored carefully while you are receiving this medication. You may need bloodwork while taking this medication. Talk to your care team about your risk of cancer. You may be more at risk for certain types of cancer if you take this medication. What side effects may I notice from receiving this medication? Side effects that you should report to your care team as soon as possible: Allergic reactions--skin rash, itching, hives, swelling of the face, lips, tongue, or throat Capillary leak syndrome--stomach or muscle pain, unusual weakness or fatigue, feeling faint or lightheaded, decrease in the amount of urine, swelling of the ankles, hands, or   feet, trouble breathing High white blood cell level--fever, fatigue, trouble breathing, night sweats, change in vision, weight loss Inflammation of the aorta--fever, fatigue, back, chest, or stomach pain, severe headache Kidney injury (glomerulonephritis)--decrease in the amount of urine, red or dark brown urine, foamy or bubbly urine, swelling of the ankles, hands, or  feet Shortness of breath or trouble breathing Spleen injury--pain in upper left stomach or shoulder Unusual bruising or bleeding Side effects that usually do not require medical attention (report to your care team if they continue or are bothersome): Back pain Bone pain Fatigue Fever Headache Nausea This list may not describe all possible side effects. Call your doctor for medical advice about side effects. You may report side effects to FDA at 1-800-FDA-1088. Where should I keep my medication? Keep out of the reach of children and pets. Keep this medication in the original packaging until you are ready to take it. Protect from light. See product for storage information. Each product may have different instructions. Get rid of any unused medication after the expiration date. To get rid of medications that are no longer needed or have expired: Take the medication to a medications take-back program. Check with your pharmacy or law enforcement to find a location. If you cannot return the medication, ask your pharmacist or care team how to get rid of this medication safely. NOTE: This sheet is a summary. It may not cover all possible information. If you have questions about this medicine, talk to your doctor, pharmacist, or health care provider.  2023 Elsevier/Gold Standard (2021-04-26 00:00:00)  

## 2021-09-25 ENCOUNTER — Other Ambulatory Visit: Payer: Self-pay | Admitting: Physician Assistant

## 2021-09-25 DIAGNOSIS — C8300 Small cell B-cell lymphoma, unspecified site: Secondary | ICD-10-CM

## 2021-09-26 ENCOUNTER — Encounter: Payer: Self-pay | Admitting: Internal Medicine

## 2021-09-28 ENCOUNTER — Other Ambulatory Visit: Payer: Self-pay | Admitting: *Deleted

## 2021-09-28 ENCOUNTER — Other Ambulatory Visit: Payer: Self-pay | Admitting: Physician Assistant

## 2021-09-28 ENCOUNTER — Telehealth: Payer: Self-pay | Admitting: Internal Medicine

## 2021-09-28 ENCOUNTER — Inpatient Hospital Stay: Payer: Medicare HMO

## 2021-09-28 ENCOUNTER — Other Ambulatory Visit: Payer: Self-pay

## 2021-09-28 DIAGNOSIS — D630 Anemia in neoplastic disease: Secondary | ICD-10-CM | POA: Diagnosis not present

## 2021-09-28 DIAGNOSIS — D649 Anemia, unspecified: Secondary | ICD-10-CM | POA: Diagnosis not present

## 2021-09-28 DIAGNOSIS — C8300 Small cell B-cell lymphoma, unspecified site: Secondary | ICD-10-CM

## 2021-09-28 DIAGNOSIS — Z5112 Encounter for antineoplastic immunotherapy: Secondary | ICD-10-CM | POA: Diagnosis not present

## 2021-09-28 DIAGNOSIS — I878 Other specified disorders of veins: Secondary | ICD-10-CM | POA: Diagnosis not present

## 2021-09-28 DIAGNOSIS — T451X5A Adverse effect of antineoplastic and immunosuppressive drugs, initial encounter: Secondary | ICD-10-CM

## 2021-09-28 DIAGNOSIS — D696 Thrombocytopenia, unspecified: Secondary | ICD-10-CM | POA: Diagnosis not present

## 2021-09-28 DIAGNOSIS — Z79899 Other long term (current) drug therapy: Secondary | ICD-10-CM | POA: Diagnosis not present

## 2021-09-28 DIAGNOSIS — C8308 Small cell B-cell lymphoma, lymph nodes of multiple sites: Secondary | ICD-10-CM | POA: Diagnosis not present

## 2021-09-28 DIAGNOSIS — D701 Agranulocytosis secondary to cancer chemotherapy: Secondary | ICD-10-CM | POA: Diagnosis not present

## 2021-09-28 LAB — CBC WITH DIFFERENTIAL (CANCER CENTER ONLY)
Abs Immature Granulocytes: 0.01 10*3/uL (ref 0.00–0.07)
Abs Immature Granulocytes: 0.02 10*3/uL (ref 0.00–0.07)
Basophils Absolute: 0 10*3/uL (ref 0.0–0.1)
Basophils Absolute: 0 10*3/uL (ref 0.0–0.1)
Basophils Relative: 0 %
Basophils Relative: 0 %
Eosinophils Absolute: 0 10*3/uL (ref 0.0–0.5)
Eosinophils Absolute: 0 10*3/uL (ref 0.0–0.5)
Eosinophils Relative: 0 %
Eosinophils Relative: 0 %
HCT: 22.6 % — ABNORMAL LOW (ref 39.0–52.0)
HCT: 22.6 % — ABNORMAL LOW (ref 39.0–52.0)
Hemoglobin: 7.3 g/dL — ABNORMAL LOW (ref 13.0–17.0)
Hemoglobin: 7.4 g/dL — ABNORMAL LOW (ref 13.0–17.0)
Immature Granulocytes: 0 %
Immature Granulocytes: 1 %
Lymphocytes Relative: 29 %
Lymphocytes Relative: 28 %
Lymphs Abs: 0.8 10*3/uL (ref 0.7–4.0)
Lymphs Abs: 0.7 10*3/uL (ref 0.7–4.0)
MCH: 30.7 pg (ref 26.0–34.0)
MCH: 31.1 pg (ref 26.0–34.0)
MCHC: 32.3 g/dL (ref 30.0–36.0)
MCHC: 32.7 g/dL (ref 30.0–36.0)
MCV: 95 fL (ref 80.0–100.0)
MCV: 95 fL (ref 80.0–100.0)
Monocytes Absolute: 0 10*3/uL — ABNORMAL LOW (ref 0.1–1.0)
Monocytes Absolute: 0 10*3/uL — ABNORMAL LOW (ref 0.1–1.0)
Monocytes Relative: 1 %
Monocytes Relative: 1 %
Neutro Abs: 1.9 10*3/uL (ref 1.7–7.7)
Neutro Abs: 1.7 10*3/uL (ref 1.7–7.7)
Neutrophils Relative %: 70 %
Neutrophils Relative %: 70 %
Platelet Count: 97 10*3/uL — ABNORMAL LOW (ref 150–400)
Platelet Count: 109 10*3/uL — ABNORMAL LOW (ref 150–400)
RBC: 2.38 MIL/uL — ABNORMAL LOW (ref 4.22–5.81)
RBC: 2.38 MIL/uL — ABNORMAL LOW (ref 4.22–5.81)
RDW: 18.7 % — ABNORMAL HIGH (ref 11.5–15.5)
RDW: 18.6 % — ABNORMAL HIGH (ref 11.5–15.5)
WBC Count: 2.7 10*3/uL — ABNORMAL LOW (ref 4.0–10.5)
WBC Count: 2.5 10*3/uL — ABNORMAL LOW (ref 4.0–10.5)
nRBC: 0 % (ref 0.0–0.2)
nRBC: 0.8 % — ABNORMAL HIGH (ref 0.0–0.2)

## 2021-09-28 LAB — CMP (CANCER CENTER ONLY)
ALT: 14 U/L (ref 0–44)
ALT: 16 U/L (ref 0–44)
AST: 14 U/L — ABNORMAL LOW (ref 15–41)
AST: 16 U/L (ref 15–41)
Albumin: 3.8 g/dL (ref 3.5–5.0)
Albumin: 3.6 g/dL (ref 3.5–5.0)
Alkaline Phosphatase: 78 U/L (ref 38–126)
Alkaline Phosphatase: 75 U/L (ref 38–126)
Anion gap: 4 — ABNORMAL LOW (ref 5–15)
Anion gap: 8 (ref 5–15)
BUN: 13 mg/dL (ref 8–23)
BUN: 14 mg/dL (ref 8–23)
CO2: 28 mmol/L (ref 22–32)
CO2: 26 mmol/L (ref 22–32)
Calcium: 9.2 mg/dL (ref 8.9–10.3)
Calcium: 9.2 mg/dL (ref 8.9–10.3)
Chloride: 104 mmol/L (ref 98–111)
Chloride: 107 mmol/L (ref 98–111)
Creatinine: 0.8 mg/dL (ref 0.61–1.24)
Creatinine: 0.77 mg/dL (ref 0.61–1.24)
GFR, Estimated: >60 mL/min (ref >60)
GFR, Estimated: >60 mL/min (ref >60)
Glucose, Bld: 116 mg/dL — ABNORMAL HIGH (ref 70–99)
Glucose, Bld: 140 mg/dL — ABNORMAL HIGH (ref 70–99)
Potassium: 4.1 mmol/L (ref 3.5–5.1)
Potassium: 4 mmol/L (ref 3.5–5.1)
Sodium: 136 mmol/L (ref 135–145)
Sodium: 141 mmol/L (ref 135–145)
Total Bilirubin: 0.5 mg/dL (ref 0.3–1.2)
Total Bilirubin: 0.3 mg/dL (ref 0.3–1.2)
Total Protein: 6.8 g/dL (ref 6.5–8.1)
Total Protein: 7.1 g/dL (ref 6.5–8.1)

## 2021-09-28 LAB — PHOSPHORUS
Phosphorus: 3.8 mg/dL (ref 2.5–4.6)
Phosphorus: 3.8 mg/dL (ref 2.5–4.6)

## 2021-09-28 LAB — MAGNESIUM
Magnesium: 1.6 mg/dL — ABNORMAL LOW (ref 1.7–2.4)
Magnesium: 1.7 mg/dL (ref 1.7–2.4)

## 2021-09-28 LAB — SAMPLE TO BLOOD BANK

## 2021-09-28 LAB — PREPARE RBC (CROSSMATCH)

## 2021-09-28 LAB — URIC ACID
Uric Acid, Serum: 3 mg/dL — ABNORMAL LOW (ref 3.7–8.6)
Uric Acid, Serum: 2.8 mg/dL — ABNORMAL LOW (ref 3.7–8.6)

## 2021-09-28 NOTE — Progress Notes (Signed)
Per Dr. Julien Nordmann: Please arrange for him to receive 2 units of PRBCs transfusion this week.  Appointment scheduled and order entered. Confirmed orders for T&S and prepare w/[facility]

## 2021-09-28 NOTE — Telephone Encounter (Signed)
Contacted patient to scheduled appointments. Patient is aware of appointments that are scheduled.   

## 2021-09-29 ENCOUNTER — Inpatient Hospital Stay: Payer: Medicare HMO

## 2021-09-29 ENCOUNTER — Ambulatory Visit (HOSPITAL_COMMUNITY)
Admission: RE | Admit: 2021-09-29 | Discharge: 2021-09-29 | Disposition: A | Discharge status: Home / Self Care | Payer: Medicare HMO | Source: Ambulatory Visit | Attending: Internal Medicine | Admitting: Internal Medicine

## 2021-09-29 ENCOUNTER — Encounter (HOSPITAL_COMMUNITY): Payer: Self-pay

## 2021-09-29 DIAGNOSIS — D696 Thrombocytopenia, unspecified: Secondary | ICD-10-CM | POA: Insufficient documentation

## 2021-09-29 DIAGNOSIS — D649 Anemia, unspecified: Secondary | ICD-10-CM | POA: Insufficient documentation

## 2021-09-29 DIAGNOSIS — Z79899 Other long term (current) drug therapy: Secondary | ICD-10-CM | POA: Diagnosis not present

## 2021-09-29 DIAGNOSIS — I878 Other specified disorders of veins: Secondary | ICD-10-CM | POA: Insufficient documentation

## 2021-09-29 DIAGNOSIS — Z452 Encounter for adjustment and management of vascular access device: Secondary | ICD-10-CM | POA: Diagnosis not present

## 2021-09-29 DIAGNOSIS — C8300 Small cell B-cell lymphoma, unspecified site: Secondary | ICD-10-CM | POA: Insufficient documentation

## 2021-09-29 DIAGNOSIS — C8308 Small cell B-cell lymphoma, lymph nodes of multiple sites: Secondary | ICD-10-CM | POA: Diagnosis not present

## 2021-09-29 DIAGNOSIS — D630 Anemia in neoplastic disease: Secondary | ICD-10-CM | POA: Diagnosis not present

## 2021-09-29 DIAGNOSIS — C9110 Chronic lymphocytic leukemia of B-cell type not having achieved remission: Secondary | ICD-10-CM | POA: Diagnosis not present

## 2021-09-29 DIAGNOSIS — Z5112 Encounter for antineoplastic immunotherapy: Secondary | ICD-10-CM | POA: Diagnosis not present

## 2021-09-29 HISTORY — PX: IR IMAGING GUIDED PORT INSERTION: IMG5740

## 2021-09-29 LAB — CMP (CANCER CENTER ONLY)
ALT: 14 U/L (ref 0–44)
AST: 15 U/L (ref 15–41)
Albumin: 3.9 g/dL (ref 3.5–5.0)
Alkaline Phosphatase: 77 U/L (ref 38–126)
Anion gap: 5 (ref 5–15)
BUN: 15 mg/dL (ref 8–23)
CO2: 30 mmol/L (ref 22–32)
Calcium: 8.9 mg/dL (ref 8.9–10.3)
Chloride: 104 mmol/L (ref 98–111)
Creatinine: 0.78 mg/dL (ref 0.61–1.24)
GFR, Estimated: >60 mL/min (ref >60)
Glucose, Bld: 114 mg/dL — ABNORMAL HIGH (ref 70–99)
Potassium: 3.9 mmol/L (ref 3.5–5.1)
Sodium: 139 mmol/L (ref 135–145)
Total Bilirubin: 0.4 mg/dL (ref 0.3–1.2)
Total Protein: 7.1 g/dL (ref 6.5–8.1)

## 2021-09-29 LAB — CBC WITH DIFFERENTIAL (CANCER CENTER ONLY)
Abs Immature Granulocytes: 0.02 10*3/uL (ref 0.00–0.07)
Basophils Absolute: 0 10*3/uL (ref 0.0–0.1)
Basophils Relative: 0 %
Eosinophils Absolute: 0 10*3/uL (ref 0.0–0.5)
Eosinophils Relative: 0 %
HCT: 22.8 % — ABNORMAL LOW (ref 39.0–52.0)
Hemoglobin: 7.4 g/dL — ABNORMAL LOW (ref 13.0–17.0)
Immature Granulocytes: 1 %
Lymphocytes Relative: 33 %
Lymphs Abs: 0.8 10*3/uL (ref 0.7–4.0)
MCH: 30.7 pg (ref 26.0–34.0)
MCHC: 32.5 g/dL (ref 30.0–36.0)
MCV: 94.6 fL (ref 80.0–100.0)
Monocytes Absolute: 0 10*3/uL — ABNORMAL LOW (ref 0.1–1.0)
Monocytes Relative: 1 %
Neutro Abs: 1.4 10*3/uL — ABNORMAL LOW (ref 1.7–7.7)
Neutrophils Relative %: 65 %
Platelet Count: 107 10*3/uL — ABNORMAL LOW (ref 150–400)
RBC: 2.41 MIL/uL — ABNORMAL LOW (ref 4.22–5.81)
RDW: 18.6 % — ABNORMAL HIGH (ref 11.5–15.5)
WBC Count: 2.3 10*3/uL — ABNORMAL LOW (ref 4.0–10.5)
nRBC: 0 % (ref 0.0–0.2)

## 2021-09-29 LAB — PHOSPHORUS: Phosphorus: 3.6 mg/dL (ref 2.5–4.6)

## 2021-09-29 LAB — MAGNESIUM: Magnesium: 1.7 mg/dL (ref 1.7–2.4)

## 2021-09-29 LAB — URIC ACID: Uric Acid, Serum: 3.1 mg/dL — ABNORMAL LOW (ref 3.7–8.6)

## 2021-09-29 MED ORDER — MIDAZOLAM HCL 2 MG/2ML IJ SOLN
INTRAMUSCULAR | Status: AC | PRN
  Administered 2021-09-29: 1 mg via INTRAVENOUS
  Administered 2021-09-29 (×2): .5 mg via INTRAVENOUS

## 2021-09-29 MED ORDER — LIDOCAINE-EPINEPHRINE 1 %-1:100000 IJ SOLN
INTRAMUSCULAR | Status: AC
  Administered 2021-09-29: 10 mL via SUBCUTANEOUS
  Filled 2021-09-29: qty 1

## 2021-09-29 MED ORDER — HEPARIN SOD (PORK) LOCK FLUSH 100 UNIT/ML IV SOLN
INTRAVENOUS | Status: AC
  Administered 2021-09-29: 500 [IU] via INTRAVENOUS
  Filled 2021-09-29: qty 5

## 2021-09-29 MED ORDER — LIDOCAINE HCL 1 % IJ SOLN
INTRAMUSCULAR | Status: AC
  Administered 2021-09-29: 10 mL via SUBCUTANEOUS
  Filled 2021-09-29: qty 20

## 2021-09-29 MED ORDER — MIDAZOLAM HCL 2 MG/2ML IJ SOLN
INTRAMUSCULAR | Status: AC
  Filled 2021-09-29: qty 4

## 2021-09-29 MED ORDER — FENTANYL CITRATE (PF) 100 MCG/2ML IJ SOLN
INTRAMUSCULAR | Status: AC | PRN
  Administered 2021-09-29: 50 ug via INTRAVENOUS
  Administered 2021-09-29 (×2): 25 ug via INTRAVENOUS

## 2021-09-29 MED ORDER — SODIUM CHLORIDE 0.9 % IV SOLN: INTRAVENOUS | Status: DC

## 2021-09-29 MED ORDER — FENTANYL CITRATE (PF) 100 MCG/2ML IJ SOLN
INTRAMUSCULAR | Status: AC
  Filled 2021-09-29: qty 4

## 2021-09-29 NOTE — Procedures (Signed)
Interventional Radiology Procedure:   Indications: Small lymphocytic lymphoma  Procedure: Port placement  Findings: Right jugular port, tip at SVC/RA junction  Complications: None     EBL: Minimal, less than 10 ml  Plan: Discharge in one hour.  Keep port site and incisions dry for at least 24 hours.     Olanda Boughner R. Anselm Pancoast, MD  Pager: [phone]

## 2021-09-29 NOTE — Discharge Instructions (Signed)
For questions /concerns may call Interventional Radiology at [phone] or  Interventional Radiology clinic [phone]   You may remove your dressing and shower tomorrow afternoon  DO NOT use EMLA cream for 2 weeks after port placement as the cream will remove surgical glue on your incision.    Implanted Port Insertion, Care After This sheet gives you information about how to care for yourself after your procedure. Your health care provider may also give you more specific instructions. If you have problems or questions, contact your health careprovider. What can I expect after the procedure? After the procedure, it is common to have: Discomfort at the port insertion site. Bruising on the skin over the port. This should improve over 3-4 days. Follow these instructions at home: Port care After your port is placed, you will get a manufacturer's information card. The card has information about your port. Keep this card with you at all times. Take care of the port as told by your health care provider. Ask your health care provider if you or a family member can get training for taking care of the port at home. A home health care nurse may also take care of the port. Make sure to remember what type of port you have. Incision care Follow instructions from your health care provider about how to take care of your port insertion site. Make sure you: Wash your hands with soap and water before and after you change your bandage (dressing). If soap and water are not available, use hand sanitizer. Change your dressing as told by your health care provider. Leave skin glue, or adhesive strips in place. These skin closures may need to stay in place for 2 weeks or longer.  Check your port insertion site every day for signs of infection. Check for:      - Redness, swelling, or pain.                     - Fluid or blood.      - Warmth.      - Pus or a bad smell. Activity Return to your normal activities as  told by your health care provider. Ask your health care provider what activities are safe for you. Do not lift anything that is heavier than 10 lb (4.5 kg), or the limit that you are told, until your health care provider says that it is safe. General instructions Take over-the-counter and prescription medicines only as told by your health care provider. Do not take baths, swim, or use a hot tub until your health care provider approves. Ask your health care provider if you may take showers. You may only be allowed to take sponge baths. Do not drive for 24 hours if you were given a sedative during your procedure. Wear a medical alert bracelet in case of an emergency. This will tell any health care providers that you have a port. Keep all follow-up visits as told by your health care provider. This is important. Contact a health care provider if: You cannot flush your port with saline as directed, or you cannot draw blood from the port. You have a fever or chills. You have redness, swelling, or pain around your port insertion site. You have fluid or blood coming from your port insertion site. Your port insertion site feels warm to the touch. You have pus or a bad smell coming from the port insertion site. Get help right away if: You have chest pain or shortness of   breath. You have bleeding from your port that you cannot control. Summary Take care of the port as told by your health care provider. Keep the manufacturer's information card with you at all times. Change your dressing as told by your health care provider. Contact a health care provider if you have a fever or chills or if you have redness, swelling, or pain around your port insertion site. Keep all follow-up visits as told by your health care provider. This information is not intended to replace advice given to you by your health care provider. Make sure you discuss any questions you have with your healthcare provider. Document Revised:  08/14/2017 Document Reviewed: 08/14/2017    Moderate Conscious Sedation, Adult, Care After This sheet gives you information about how to care for yourself after your procedure. Your health care provider may also give you more specific instructions. If you have problems or questions, contact your health careprovider. What can I expect after the procedure? After the procedure, it is common to have: Sleepiness for several hours. Impaired judgment for several hours. Difficulty with balance. Vomiting if you eat too soon. Follow these instructions at home: For the time period you were told by your health care provider: Rest. Do not participate in activities where you could fall or become injured. Do not drive or use machinery. Do not drink alcohol. Do not take sleeping pills or medicines that cause drowsiness. Do not make important decisions or sign legal documents. Do not take care of children on your own. Eating and drinking  Follow the diet recommended by your health care provider. Drink enough fluid to keep your urine pale yellow. If you vomit: Drink water, juice, or soup when you can drink without vomiting. Make sure you have little or no nausea before eating solid foods.  General instructions Take over-the-counter and prescription medicines only as told by your health care provider. Have a responsible adult stay with you for the time you are told. It is important to have someone help care for you until you are awake and alert. Do not smoke. Keep all follow-up visits as told by your health care provider. This is important. Contact a health care provider if: You are still sleepy or having trouble with balance after 24 hours. You feel light-headed. You keep feeling nauseous or you keep vomiting. You develop a rash. You have a fever. You have redness or swelling around the IV site. Get help right away if: You have trouble breathing. You have new-onset confusion at  home. Summary After the procedure, it is common to feel sleepy, have impaired judgment, or feel nauseous if you eat too soon. Rest after you get home. Know the things you should not do after the procedure. Follow the diet recommended by your health care provider and drink enough fluid to keep your urine pale yellow. Get help right away if you have trouble breathing or new-onset confusion at home. This information is not intended to replace advice given to you by your health care provider. Make sure you discuss any questions you have with your healthcare provider. Document Revised: 05/16/2019 Document Reviewed: 12/12/2018 Elsevier Patient Education  2022 Elsevier Inc.  

## 2021-09-29 NOTE — Progress Notes (Signed)
Call to patient's designated contact - Joseph Moyer. Discussed timeframes for pre/intra and post procedure. Verbalized understanding. Aware that call will be made post procedure to discuss pick up time and any further discharge instructions.

## 2021-09-29 NOTE — H&P (Signed)
Referring Physician(s): Mohamed,Mohamed  Supervising Physician: Markus Daft  Patient Status:  [facility]  Chief Complaint:  "I'm getting a port a cath"  Subjective: Patient familiar to IR service from left inguinal lymph node biopsy on 03/29/2020.  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  Recent lymph node biopsy was consistent with the above diagnosis.  He also has worsening anemia and thrombocytopenia.  He presents today for Port-A-Cath placement to assist with treatment.  He currently denies fever, headache, chest pain, dyspnea, cough, abdominal/back pain, nausea, vomiting or bleeding.  Does have some fatigue.  Additional history as below.   Past Medical History:  Diagnosis Date   Allergy    Arthritis    CAD in native artery    COPD (chronic obstructive pulmonary disease) (HCC)    GERD (gastroesophageal reflux disease)    Hyperlipidemia    Hypertension    Mild carotid artery disease (HCC)    Small lymphocytic lymphoma ([place])    Past Surgical History:  Procedure Laterality Date   BRONCHIAL NEEDLE ASPIRATION BIOPSY  03/12/2020   Procedure: BRONCHIAL NEEDLE ASPIRATION BIOPSIES;  Surgeon: Garner Nash, DO;  Location: [facility];  Service: Pulmonary;;   CIRCUMCISION     COLONOSCOPY WITH PROPOFOL N/A 05/10/2015   Procedure: COLONOSCOPY WITH PROPOFOL;  Surgeon: Garlan Fair, MD;  Location: [facility];  Service: Endoscopy;  Laterality: N/A;   DECOMPRESSIVE LUMBAR LAMINECTOMY LEVEL 1 N/A 06/15/2018   Procedure: Gordy Levan decompression L1-L2/ complete inferior facetectomyof L1. Posterior lateral arthrodesiswith autograft bone L1-L2.;  Surgeon: Melina Schools, MD;  Location: [facility];  Service: Orthopedics;  Laterality: N/A;   HEMORRHOID SURGERY     VIDEO BRONCHOSCOPY WITH ENDOBRONCHIAL ULTRASOUND N/A 03/12/2020   Procedure: VIDEO BRONCHOSCOPY WITH ENDOBRONCHIAL ULTRASOUND;   Surgeon: Garner Nash, DO;  Location: [facility];  Service: Pulmonary;  Laterality: N/A;      Allergies: Obinutuzumab, Oxycodone, Ramipril, and Rosuvastatin  Medications: Prior to Admission medications   Medication Sig Start Date End Date Taking? Authorizing Provider  allopurinol (ZYLOPRIM) 300 MG tablet Take 1 tablet (300 mg total) by mouth 2 (two) times daily. 09/21/21  Yes Curt Bears, MD  amLODipine (NORVASC) 5 MG tablet Take 5 mg by mouth daily.   Yes [provider]  atorvastatin (LIPITOR) 40 MG tablet Take 1 tablet (40 mg total) by mouth daily. 09/20/21  Yes Dunn, Dayna N, PA-C  diphenhydrAMINE HCl, Sleep, 50 MG CAPS Take 50 mg by mouth at bedtime.   Yes [provider]  losartan (COZAAR) 25 MG tablet Take 1 tablet (25 mg total) by mouth daily. 09/20/21  Yes Dunn, Dayna N, PA-C  meloxicam (MOBIC) 15 MG tablet Take 15 mg by mouth daily as needed. 08/30/21  Yes [provider]  methocarbamol (ROBAXIN) 750 MG tablet Take 750 mg by mouth every 6 (six) hours as needed. 08/20/21  Yes [provider]  metoprolol succinate (TOPROL XL) 25 MG 24 hr tablet Take 1 tablet (25 mg total) by mouth daily. 09/20/21  Yes Dunn, Dayna N, PA-C  Naphazoline-Glycerin (CLEAR EYES REDNESS RELIEF OP) Place 1 drop into both eyes as needed.   Yes [provider]  omeprazole (PRILOSEC) 20 MG capsule TAKE 1 CAPSULE BY MOUTH EVERY DAY Patient taking differently: Take by mouth as needed. 08/16/21  Yes Heilingoetter, Cassandra L, PA-C  OVER THE COUNTER MEDICATION Apply 1 application topically daily as needed (pain). Hemp cream  Yes [provider]  prochlorperazine (COMPAZINE) 10 MG tablet Take 1 tablet (10 mg total) by mouth every 6 (six) hours as needed for nausea or vomiting. 08/31/21  Yes Curt Bears, MD  tamsulosin (FLOMAX) 0.4 MG CAPS capsule Take 0.4 mg by mouth daily. 08/04/21  Yes [provider]  umeclidinium bromide (INCRUSE ELLIPTA) 62.5  MCG/INH AEPB Inhale 1 puff into the lungs daily. 05/28/20  Yes Icard, Octavio Graves, DO  venetoclax (VENCLEXTA) 10 & 50 & 100 MG Starter Pack Take 20 mg by mouth daily for 7 days, then 50 mg daily for 7 days, then 100 mg daily for 7 days, then 200 mg daily for 7 days. Take with food & water. 09/01/21  Yes Curt Bears, MD  FeFum-FePoly-FA-B Cmp-C-Biot (INTEGRA PLUS) CAPS Take 1 capsule by mouth daily. 08/31/21   Curt Bears, MD  lidocaine-prilocaine (EMLA) cream Apply 1 Application topically as needed. 1-2 hours prior to treatment ,apply a tsp of the numbing cream to the skin over the port site and cover with cling wrap or other wrap. Do not rub in the cream. Do not use on the area until the port site is healed . 09/22/21   Curt Bears, MD     Vital Signs: BP (!) 120/58   Pulse 84   Temp 98.4 F (36.9 C) (Oral)   Resp 16   Ht 6' (1.829 m)   Wt 207 lb 3.7 oz (94 kg)   SpO2 95%   BMI 28.11 kg/m   Physical Exam awake, alert.  Chest clear to auscultation bilaterally.  Heart with regular rate and rhythm.  Abdomen soft, positive bowel sounds, nontender.  No significant lower extremity edema.  Imaging: No results found.  Labs:  CBC: Recent Labs    09/21/21 0909 09/28/21 0919 09/28/21 1448 09/29/21 1150  WBC 1.5* 2.7* 2.5* 2.3*  HGB 7.8* 7.3* 7.4* 7.4*  HCT 23.5* 22.6* 22.6* 22.8*  PLT 126* 97* 109* 107*    COAGS: No results for input(s): "INR", "APTT" in the last 8760 hours.  BMP: Recent Labs    09/21/21 0909 09/28/21 0919 09/28/21 1448 09/29/21 1150  NA 136 136 141 139  K 3.8 4.1 4.0 3.9  CL 104 104 107 104  CO2 '27 28 26 30  '$ GLUCOSE 143* 116* 140* 114*  BUN '16 13 14 15  '$ CALCIUM 9.0 9.2 9.2 8.9  CREATININE 0.76 0.80 0.77 0.78  GFRNONAA >60 >60 >60 >60    LIVER FUNCTION TESTS: Recent Labs    09/21/21 0909 09/28/21 0919 09/28/21 1448 09/29/21 1150  BILITOT 0.5 0.5 0.3 0.4  AST 18 14* 16 15  ALT '22 14 16 14  '$ ALKPHOS 79 78 75 77  PROT 6.9 6.8 7.1 7.1   ALBUMIN 3.7 3.8 3.6 3.9    Assessment and Plan: Patient familiar to IR service from left inguinal lymph node biopsy on 03/29/2020.  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  Recent lymph node biopsy was consistent with the above diagnosis.  He also has worsening anemia and thrombocytopenia.  He presents today for Port-A-Cath placement to assist with treatment.Risks and benefits of image guided port-a-catheter placement was discussed with the patient including, but not limited to bleeding, infection, pneumothorax, or fibrin sheath development and need for additional procedures.  All of the patient's questions were answered, patient is agreeable to proceed. Consent signed and in chart.  Patient's hemoglobin today 7.4; discussed with  Dr. Julien Nordmann who recommends proceeding with port placement; pt scheduled for transfusion tomorrow.   Electronically Signed: D. Rowe Robert, PA-C 09/29/2021, 1:51 PM   I spent a total of 25 Minutes at the the patient's bedside AND on the patient's hospital floor or unit, greater than 50% of which was counseling/coordinating care for port a  cath placement

## 2021-09-30 ENCOUNTER — Inpatient Hospital Stay: Payer: Medicare HMO | Attending: Physician Assistant

## 2021-09-30 ENCOUNTER — Other Ambulatory Visit: Payer: Self-pay

## 2021-09-30 DIAGNOSIS — C8300 Small cell B-cell lymphoma, unspecified site: Secondary | ICD-10-CM

## 2021-09-30 DIAGNOSIS — Z5112 Encounter for antineoplastic immunotherapy: Secondary | ICD-10-CM | POA: Insufficient documentation

## 2021-09-30 DIAGNOSIS — D701 Agranulocytosis secondary to cancer chemotherapy: Secondary | ICD-10-CM | POA: Diagnosis not present

## 2021-09-30 DIAGNOSIS — Z79899 Other long term (current) drug therapy: Secondary | ICD-10-CM | POA: Insufficient documentation

## 2021-09-30 DIAGNOSIS — C8308 Small cell B-cell lymphoma, lymph nodes of multiple sites: Secondary | ICD-10-CM | POA: Insufficient documentation

## 2021-09-30 DIAGNOSIS — T451X5A Adverse effect of antineoplastic and immunosuppressive drugs, initial encounter: Secondary | ICD-10-CM | POA: Diagnosis not present

## 2021-09-30 MED ORDER — SODIUM CHLORIDE 0.9% FLUSH
10.0000 mL | INTRAVENOUS | Status: AC | PRN
  Administered 2021-09-30: 10 mL

## 2021-09-30 MED ORDER — SODIUM CHLORIDE 0.9% IV SOLUTION
250.0000 mL | Freq: Once | INTRAVENOUS | Status: AC
  Administered 2021-09-30: 250 mL via INTRAVENOUS

## 2021-09-30 MED ORDER — DIPHENHYDRAMINE HCL 25 MG PO CAPS
25.0000 mg | ORAL_CAPSULE | Freq: Once | ORAL | Status: AC
  Administered 2021-09-30: 25 mg via ORAL
  Filled 2021-09-30: qty 1

## 2021-09-30 MED ORDER — HEPARIN SOD (PORK) LOCK FLUSH 100 UNIT/ML IV SOLN
500.0000 [IU] | Freq: Every day | INTRAVENOUS | Status: AC | PRN
  Administered 2021-09-30: 500 [IU]

## 2021-09-30 MED ORDER — ACETAMINOPHEN 325 MG PO TABS
650.0000 mg | ORAL_TABLET | Freq: Once | ORAL | Status: AC
  Administered 2021-09-30: 650 mg via ORAL
  Filled 2021-09-30: qty 2

## 2021-09-30 NOTE — Patient Instructions (Signed)

## 2021-10-01 LAB — TYPE AND SCREEN
ABO/RH(D): A NEG
Antibody Screen: NEGATIVE
Unit division: 0
Unit division: 0

## 2021-10-01 LAB — BPAM RBC
Blood Product Expiration Date: 202309062359
Blood Product Expiration Date: 202309072359
ISSUE DATE / TIME: 202309011031
ISSUE DATE / TIME: 202309011031
Unit Type and Rh: 600
Unit Type and Rh: 600

## 2021-10-02 ENCOUNTER — Other Ambulatory Visit: Payer: Self-pay | Admitting: Internal Medicine

## 2021-10-04 ENCOUNTER — Encounter: Payer: Self-pay | Admitting: Internal Medicine

## 2021-10-04 ENCOUNTER — Other Ambulatory Visit: Payer: Self-pay | Admitting: Internal Medicine

## 2021-10-04 MED FILL — Dexamethasone Sodium Phosphate Inj 100 MG/10ML: INTRAMUSCULAR | Qty: 2 | Status: AC

## 2021-10-05 ENCOUNTER — Other Ambulatory Visit: Payer: Self-pay

## 2021-10-05 ENCOUNTER — Inpatient Hospital Stay: Payer: Medicare HMO

## 2021-10-05 VITALS — BP 122/74 | HR 82 | Temp 98.5°F | Resp 17

## 2021-10-05 DIAGNOSIS — D701 Agranulocytosis secondary to cancer chemotherapy: Secondary | ICD-10-CM | POA: Diagnosis not present

## 2021-10-05 DIAGNOSIS — T451X5A Adverse effect of antineoplastic and immunosuppressive drugs, initial encounter: Secondary | ICD-10-CM | POA: Diagnosis not present

## 2021-10-05 DIAGNOSIS — Z79899 Other long term (current) drug therapy: Secondary | ICD-10-CM | POA: Diagnosis not present

## 2021-10-05 DIAGNOSIS — C8300 Small cell B-cell lymphoma, unspecified site: Secondary | ICD-10-CM

## 2021-10-05 DIAGNOSIS — Z95828 Presence of other vascular implants and grafts: Secondary | ICD-10-CM

## 2021-10-05 DIAGNOSIS — Z5112 Encounter for antineoplastic immunotherapy: Secondary | ICD-10-CM | POA: Diagnosis not present

## 2021-10-05 DIAGNOSIS — C8308 Small cell B-cell lymphoma, lymph nodes of multiple sites: Secondary | ICD-10-CM | POA: Diagnosis not present

## 2021-10-05 LAB — CMP (CANCER CENTER ONLY)
ALT: 15 U/L (ref 0–44)
ALT: 19 U/L (ref 0–44)
AST: 14 U/L — ABNORMAL LOW (ref 15–41)
AST: 21 U/L (ref 15–41)
Albumin: 4 g/dL (ref 3.5–5.0)
Albumin: 3.9 g/dL (ref 3.5–5.0)
Alkaline Phosphatase: 77 U/L (ref 38–126)
Alkaline Phosphatase: 74 U/L (ref 38–126)
Anion gap: 4 — ABNORMAL LOW (ref 5–15)
Anion gap: 6 (ref 5–15)
BUN: 15 mg/dL (ref 8–23)
BUN: 15 mg/dL (ref 8–23)
CO2: 28 mmol/L (ref 22–32)
CO2: 23 mmol/L (ref 22–32)
Calcium: 9.5 mg/dL (ref 8.9–10.3)
Calcium: 9.2 mg/dL (ref 8.9–10.3)
Chloride: 106 mmol/L (ref 98–111)
Chloride: 108 mmol/L (ref 98–111)
Creatinine: 0.73 mg/dL (ref 0.61–1.24)
Creatinine: 0.86 mg/dL (ref 0.61–1.24)
GFR, Estimated: >60 mL/min (ref >60)
GFR, Estimated: >60 mL/min (ref >60)
Glucose, Bld: 116 mg/dL — ABNORMAL HIGH (ref 70–99)
Glucose, Bld: 262 mg/dL — ABNORMAL HIGH (ref 70–99)
Potassium: 3.9 mmol/L (ref 3.5–5.1)
Potassium: 4.2 mmol/L (ref 3.5–5.1)
Sodium: 138 mmol/L (ref 135–145)
Sodium: 137 mmol/L (ref 135–145)
Total Bilirubin: 0.5 mg/dL (ref 0.3–1.2)
Total Bilirubin: 0.4 mg/dL (ref 0.3–1.2)
Total Protein: 7.2 g/dL (ref 6.5–8.1)
Total Protein: 7.5 g/dL (ref 6.5–8.1)

## 2021-10-05 LAB — CBC WITH DIFFERENTIAL (CANCER CENTER ONLY)
Abs Immature Granulocytes: 0.01 10*3/uL (ref 0.00–0.07)
Abs Immature Granulocytes: 0 10*3/uL (ref 0.00–0.07)
Basophils Absolute: 0 10*3/uL (ref 0.0–0.1)
Basophils Absolute: 0 10*3/uL (ref 0.0–0.1)
Basophils Relative: 0 %
Basophils Relative: 1 %
Eosinophils Absolute: 0 10*3/uL (ref 0.0–0.5)
Eosinophils Absolute: 0 10*3/uL (ref 0.0–0.5)
Eosinophils Relative: 1 %
Eosinophils Relative: 0 %
HCT: 29.8 % — ABNORMAL LOW (ref 39.0–52.0)
HCT: 30.9 % — ABNORMAL LOW (ref 39.0–52.0)
Hemoglobin: 10 g/dL — ABNORMAL LOW (ref 13.0–17.0)
Hemoglobin: 10.3 g/dL — ABNORMAL LOW (ref 13.0–17.0)
Immature Granulocytes: 1 %
Immature Granulocytes: 0 %
Lymphocytes Relative: 47 %
Lymphocytes Relative: 22 %
Lymphs Abs: 1 10*3/uL (ref 0.7–4.0)
Lymphs Abs: 0.4 10*3/uL — ABNORMAL LOW (ref 0.7–4.0)
MCH: 30.1 pg (ref 26.0–34.0)
MCH: 30 pg (ref 26.0–34.0)
MCHC: 33.6 g/dL (ref 30.0–36.0)
MCHC: 33.3 g/dL (ref 30.0–36.0)
MCV: 89.8 fL (ref 80.0–100.0)
MCV: 90.1 fL (ref 80.0–100.0)
Monocytes Absolute: 0 10*3/uL — ABNORMAL LOW (ref 0.1–1.0)
Monocytes Absolute: 0 10*3/uL — ABNORMAL LOW (ref 0.1–1.0)
Monocytes Relative: 2 %
Monocytes Relative: 1 %
Neutro Abs: 1 10*3/uL — ABNORMAL LOW (ref 1.7–7.7)
Neutro Abs: 1.6 10*3/uL — ABNORMAL LOW (ref 1.7–7.7)
Neutrophils Relative %: 49 %
Neutrophils Relative %: 76 %
Platelet Count: 120 10*3/uL — ABNORMAL LOW (ref 150–400)
Platelet Count: 129 10*3/uL — ABNORMAL LOW (ref 150–400)
RBC: 3.32 MIL/uL — ABNORMAL LOW (ref 4.22–5.81)
RBC: 3.43 MIL/uL — ABNORMAL LOW (ref 4.22–5.81)
RDW: 17.9 % — ABNORMAL HIGH (ref 11.5–15.5)
RDW: 17.8 % — ABNORMAL HIGH (ref 11.5–15.5)
WBC Count: 2.1 10*3/uL — ABNORMAL LOW (ref 4.0–10.5)
WBC Count: 2 10*3/uL — ABNORMAL LOW (ref 4.0–10.5)
nRBC: 0 % (ref 0.0–0.2)
nRBC: 1 % — ABNORMAL HIGH (ref 0.0–0.2)

## 2021-10-05 LAB — SAMPLE TO BLOOD BANK

## 2021-10-05 LAB — PHOSPHORUS
Phosphorus: 3.9 mg/dL (ref 2.5–4.6)
Phosphorus: 2.7 mg/dL (ref 2.5–4.6)

## 2021-10-05 LAB — URIC ACID
Uric Acid, Serum: 2.9 mg/dL — ABNORMAL LOW (ref 3.7–8.6)
Uric Acid, Serum: 2.7 mg/dL — ABNORMAL LOW (ref 3.7–8.6)

## 2021-10-05 LAB — MAGNESIUM
Magnesium: 1.6 mg/dL — ABNORMAL LOW (ref 1.7–2.4)
Magnesium: 1.5 mg/dL — ABNORMAL LOW (ref 1.7–2.4)

## 2021-10-05 MED ORDER — SODIUM CHLORIDE 0.9 % IV SOLN
1000.0000 mg | Freq: Once | INTRAVENOUS | Status: AC
  Administered 2021-10-05: 1000 mg via INTRAVENOUS
  Filled 2021-10-05: qty 40

## 2021-10-05 MED ORDER — ACETAMINOPHEN 325 MG PO TABS
650.0000 mg | ORAL_TABLET | Freq: Once | ORAL | Status: AC
  Administered 2021-10-05: 650 mg via ORAL
  Filled 2021-10-05: qty 2

## 2021-10-05 MED ORDER — SODIUM CHLORIDE 0.9 % IV SOLN
20.0000 mg | Freq: Once | INTRAVENOUS | Status: AC
  Administered 2021-10-05: 20 mg via INTRAVENOUS
  Filled 2021-10-05: qty 20

## 2021-10-05 MED ORDER — HEPARIN SOD (PORK) LOCK FLUSH 100 UNIT/ML IV SOLN
500.0000 [IU] | Freq: Once | INTRAVENOUS | Status: AC | PRN
  Administered 2021-10-05: 500 [IU]

## 2021-10-05 MED ORDER — SODIUM CHLORIDE 0.9% FLUSH
10.0000 mL | INTRAVENOUS | Status: DC | PRN
  Administered 2021-10-05: 10 mL

## 2021-10-05 MED ORDER — DIPHENHYDRAMINE HCL 50 MG/ML IJ SOLN
50.0000 mg | Freq: Once | INTRAMUSCULAR | Status: AC
  Administered 2021-10-05: 50 mg via INTRAVENOUS
  Filled 2021-10-05: qty 1

## 2021-10-05 MED ORDER — METHYLPREDNISOLONE SODIUM SUCC 125 MG IJ SOLR
60.0000 mg | Freq: Once | INTRAMUSCULAR | Status: AC
  Administered 2021-10-05: 60 mg via INTRAVENOUS
  Filled 2021-10-05: qty 2

## 2021-10-05 MED ORDER — SODIUM CHLORIDE 0.9 % IV SOLN: Freq: Once | INTRAVENOUS | Status: AC

## 2021-10-05 MED ORDER — SODIUM CHLORIDE 0.9% FLUSH
10.0000 mL | INTRAVENOUS | Status: AC | PRN
  Administered 2021-10-05: 10 mL

## 2021-10-05 MED ORDER — FAMOTIDINE IN NACL 20-0.9 MG/50ML-% IV SOLN
20.0000 mg | Freq: Once | INTRAVENOUS | Status: AC
  Administered 2021-10-05: 20 mg via INTRAVENOUS
  Filled 2021-10-05: qty 50

## 2021-10-05 NOTE — Patient Instructions (Signed)
[facility] MEDICAL ONCOLOGY  Discharge Instructions: Thank you for choosing [facility] to provide your oncology and hematology care.   If you have a lab appointment with the Cancer Center, please go directly to the Cancer Center and check in at the registration area.   Wear comfortable clothing and clothing appropriate for easy access to any Portacath or PICC line.   We strive to give you quality time with your provider. You may need to reschedule your appointment if you arrive late (15 or more minutes).  Arriving late affects you and other patients whose appointments are after yours.  Also, if you miss three or more appointments without notifying the office, you may be dismissed from the clinic at the provider's discretion.      For prescription refill requests, have your pharmacy contact our office and allow 72 hours for refills to be completed.    Today you received the following chemotherapy and/or immunotherapy agents: Gazyva      To help prevent nausea and vomiting after your treatment, we encourage you to take your nausea medication as directed.  BELOW ARE SYMPTOMS THAT SHOULD BE REPORTED IMMEDIATELY: *FEVER GREATER THAN 100.4 F (38 C) OR HIGHER *CHILLS OR SWEATING *NAUSEA AND VOMITING THAT IS NOT CONTROLLED WITH YOUR NAUSEA MEDICATION *UNUSUAL SHORTNESS OF BREATH *UNUSUAL BRUISING OR BLEEDING *URINARY PROBLEMS (pain or burning when urinating, or frequent urination) *BOWEL PROBLEMS (unusual diarrhea, constipation, pain near the anus) TENDERNESS IN MOUTH AND THROAT WITH OR WITHOUT PRESENCE OF ULCERS (sore throat, sores in mouth, or a toothache) UNUSUAL RASH, SWELLING OR PAIN  UNUSUAL VAGINAL DISCHARGE OR ITCHING   Items with * indicate a potential emergency and should be followed up as soon as possible or go to the Emergency Department if any problems should occur.  Please show the CHEMOTHERAPY ALERT CARD or IMMUNOTHERAPY ALERT CARD at check-in to the  Emergency Department and triage nurse.  Should you have questions after your visit or need to cancel or reschedule your appointment, please contact [facility]ICAL [facility]COL[facility]  Dept: [phone]  and follow the prompts.  Office hours are 8:00 a.m. to 4:30 p.m. Monday - Friday. Please note that voicemails left after 4:00 p.m. may not be returned until the following business day.  We are closed weekends and major holidays. You have access to a nurse at all times for urgent questions. Please call the main number to the clinic Dept: [phone] and follow the prompts.   For any non-urgent questions, you may also contact your provider using MyChart. We now offer e-Visits for anyone 18 and older to request care online for non-urgent symptoms. For details visit [email or web address].   Also download the MyChart app! Go to the app store, search "MyChart", open the app, select [facility], and log in with your MyChart username and password.  Masks are optional in the cancer centers. If you would like for your care team to wear a mask while they are taking care of you, please let them know. You may have one support person who is at least 79 years old accompany you for your appointments. 

## 2021-10-06 ENCOUNTER — Inpatient Hospital Stay: Payer: Medicare HMO

## 2021-10-06 ENCOUNTER — Telehealth: Payer: Self-pay

## 2021-10-06 ENCOUNTER — Other Ambulatory Visit (HOSPITAL_COMMUNITY): Payer: Self-pay

## 2021-10-06 DIAGNOSIS — T451X5A Adverse effect of antineoplastic and immunosuppressive drugs, initial encounter: Secondary | ICD-10-CM | POA: Diagnosis not present

## 2021-10-06 DIAGNOSIS — Z5112 Encounter for antineoplastic immunotherapy: Secondary | ICD-10-CM | POA: Diagnosis not present

## 2021-10-06 DIAGNOSIS — D701 Agranulocytosis secondary to cancer chemotherapy: Secondary | ICD-10-CM | POA: Diagnosis not present

## 2021-10-06 DIAGNOSIS — Z79899 Other long term (current) drug therapy: Secondary | ICD-10-CM | POA: Diagnosis not present

## 2021-10-06 DIAGNOSIS — C8300 Small cell B-cell lymphoma, unspecified site: Secondary | ICD-10-CM

## 2021-10-06 DIAGNOSIS — C8308 Small cell B-cell lymphoma, lymph nodes of multiple sites: Secondary | ICD-10-CM | POA: Diagnosis not present

## 2021-10-06 LAB — CBC WITH DIFFERENTIAL (CANCER CENTER ONLY)
Abs Immature Granulocytes: 0.01 10*3/uL (ref 0.00–0.07)
Basophils Absolute: 0 10*3/uL (ref 0.0–0.1)
Basophils Relative: 0 %
Eosinophils Absolute: 0 10*3/uL (ref 0.0–0.5)
Eosinophils Relative: 0 %
HCT: 27.6 % — ABNORMAL LOW (ref 39.0–52.0)
Hemoglobin: 9.4 g/dL — ABNORMAL LOW (ref 13.0–17.0)
Immature Granulocytes: 1 %
Lymphocytes Relative: 14 %
Lymphs Abs: 0.3 10*3/uL — ABNORMAL LOW (ref 0.7–4.0)
MCH: 30.1 pg (ref 26.0–34.0)
MCHC: 34.1 g/dL (ref 30.0–36.0)
MCV: 88.5 fL (ref 80.0–100.0)
Monocytes Absolute: 0 10*3/uL — ABNORMAL LOW (ref 0.1–1.0)
Monocytes Relative: 1 %
Neutro Abs: 1.9 10*3/uL (ref 1.7–7.7)
Neutrophils Relative %: 84 %
Platelet Count: 133 10*3/uL — ABNORMAL LOW (ref 150–400)
RBC: 3.12 MIL/uL — ABNORMAL LOW (ref 4.22–5.81)
RDW: 17.5 % — ABNORMAL HIGH (ref 11.5–15.5)
WBC Count: 2.2 10*3/uL — ABNORMAL LOW (ref 4.0–10.5)
nRBC: 0 % (ref 0.0–0.2)

## 2021-10-06 LAB — CMP (CANCER CENTER ONLY)
ALT: 14 U/L (ref 0–44)
AST: 12 U/L — ABNORMAL LOW (ref 15–41)
Albumin: 4 g/dL (ref 3.5–5.0)
Alkaline Phosphatase: 73 U/L (ref 38–126)
Anion gap: 7 (ref 5–15)
BUN: 24 mg/dL — ABNORMAL HIGH (ref 8–23)
CO2: 24 mmol/L (ref 22–32)
Calcium: 9.6 mg/dL (ref 8.9–10.3)
Chloride: 105 mmol/L (ref 98–111)
Creatinine: 0.95 mg/dL (ref 0.61–1.24)
GFR, Estimated: >60 mL/min (ref >60)
Glucose, Bld: 220 mg/dL — ABNORMAL HIGH (ref 70–99)
Potassium: 3.9 mmol/L (ref 3.5–5.1)
Sodium: 136 mmol/L (ref 135–145)
Total Bilirubin: 0.4 mg/dL (ref 0.3–1.2)
Total Protein: 7.1 g/dL (ref 6.5–8.1)

## 2021-10-06 MED ORDER — HEPARIN SOD (PORK) LOCK FLUSH 100 UNIT/ML IV SOLN
500.0000 [IU] | Freq: Once | INTRAVENOUS | Status: AC
  Administered 2021-10-06: 500 [IU] via INTRAVENOUS

## 2021-10-06 MED ORDER — SODIUM CHLORIDE 0.9% FLUSH
10.0000 mL | Freq: Once | INTRAVENOUS | Status: AC
  Administered 2021-10-06: 10 mL via INTRAVENOUS

## 2021-10-07 ENCOUNTER — Encounter: Payer: Self-pay | Admitting: Internal Medicine

## 2021-10-07 ENCOUNTER — Telehealth: Payer: Self-pay | Admitting: *Deleted

## 2021-10-07 NOTE — Telephone Encounter (Signed)
"  Joseph Moyer [id number]).  I was told an e-mail was sent yesterday from [name]. Calling to confirm receipt.  I am a patient of Dr. Worthy Flank.  I would like to return to work next week.  Call and let me know."    Connected with [name] Thanked for calling this nurse.  Confirmed receipt of e-mail that did not contain his date of birth or phone number to identify correct patient. With call we reviewed each line of [employer] job description. [name] denies any need for restrictions, limitations, denies nausea, chills, B/P changes, itching, trouble breathing, headaches, increased heart rate/palpitations, diarrhea or other treatment side effects or need for intermittent FMLA.   "Receiving GAZYZA Wednesdays which is my day off.  If needed another day for holiday closure, I'll ask for the treatment day off. My job is easy.  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  Write no restrictions and return to [employer].  Call me when it is returned to [name]."    Advised provider out of office.  This nurse unable to return until next week.   E-mail: Subject: [name] Job description Attn: Dr. 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 If you have any questions or concerns feel free to contact me at any time.  Thank you, [name] [facility] [place], [place] Office: [phone] Fax: [phone] [email or web address]

## 2021-10-07 NOTE — Telephone Encounter (Signed)
Pt called requesting a refill of Venclexta.  I checked with the Pharmacy and they advised previous notes indicate that pt was not to start the rx un 06/28/21, therefore pt should still have medication left.  09/07/21 progress note indicates "He received his venetoclax prescription and is starting this on day 22 cycle #1 which is 09/28/21".   I have called the pt back to clarify when he began this rx. Pt is not able to relay when he started this medication and states he took his last tablet this morning and "it is possible I started before I was supposed to". Pt was advised I will follow-up with Dr. Julien Nordmann to determine next steps. Pt expressed understanding of this information.

## 2021-10-08 ENCOUNTER — Other Ambulatory Visit: Payer: Self-pay

## 2021-10-10 ENCOUNTER — Other Ambulatory Visit (HOSPITAL_COMMUNITY): Payer: Self-pay

## 2021-10-10 ENCOUNTER — Other Ambulatory Visit: Payer: Self-pay | Admitting: Internal Medicine

## 2021-10-10 MED ORDER — VENETOCLAX 100 MG PO TABS
400.0000 mg | ORAL_TABLET | Freq: Every day | ORAL | 3 refills | Status: DC
  Filled 2021-10-10 (×2): qty 120, 30d supply, fill #0
  Filled 2021-10-31: qty 120, 30d supply, fill #1
  Filled 2021-11-28: qty 120, 30d supply, fill #2
  Filled 2021-12-23: qty 120, 30d supply, fill #3
  Filled 2022-02-02: qty 84, 21d supply, fill #3

## 2021-10-11 ENCOUNTER — Other Ambulatory Visit (HOSPITAL_COMMUNITY): Payer: Self-pay

## 2021-10-11 ENCOUNTER — Encounter: Payer: Self-pay | Admitting: Medical Oncology

## 2021-10-12 ENCOUNTER — Inpatient Hospital Stay: Payer: Medicare HMO

## 2021-10-12 ENCOUNTER — Other Ambulatory Visit: Payer: Self-pay

## 2021-10-12 DIAGNOSIS — Z79899 Other long term (current) drug therapy: Secondary | ICD-10-CM | POA: Diagnosis not present

## 2021-10-12 DIAGNOSIS — C8308 Small cell B-cell lymphoma, lymph nodes of multiple sites: Secondary | ICD-10-CM | POA: Diagnosis not present

## 2021-10-12 DIAGNOSIS — D701 Agranulocytosis secondary to cancer chemotherapy: Secondary | ICD-10-CM | POA: Diagnosis not present

## 2021-10-12 DIAGNOSIS — C8300 Small cell B-cell lymphoma, unspecified site: Secondary | ICD-10-CM

## 2021-10-12 DIAGNOSIS — T451X5A Adverse effect of antineoplastic and immunosuppressive drugs, initial encounter: Secondary | ICD-10-CM | POA: Diagnosis not present

## 2021-10-12 DIAGNOSIS — Z5112 Encounter for antineoplastic immunotherapy: Secondary | ICD-10-CM | POA: Diagnosis not present

## 2021-10-12 LAB — CMP (CANCER CENTER ONLY)
ALT: 15 U/L (ref 0–44)
AST: 13 U/L — ABNORMAL LOW (ref 15–41)
Albumin: 3.9 g/dL (ref 3.5–5.0)
Alkaline Phosphatase: 68 U/L (ref 38–126)
Anion gap: 5 (ref 5–15)
BUN: 14 mg/dL (ref 8–23)
CO2: 30 mmol/L (ref 22–32)
Calcium: 9.1 mg/dL (ref 8.9–10.3)
Chloride: 103 mmol/L (ref 98–111)
Creatinine: 0.78 mg/dL (ref 0.61–1.24)
GFR, Estimated: >60 mL/min (ref >60)
Glucose, Bld: 150 mg/dL — ABNORMAL HIGH (ref 70–99)
Potassium: 3.9 mmol/L (ref 3.5–5.1)
Sodium: 138 mmol/L (ref 135–145)
Total Bilirubin: 0.7 mg/dL (ref 0.3–1.2)
Total Protein: 6.6 g/dL (ref 6.5–8.1)

## 2021-10-12 LAB — CBC WITH DIFFERENTIAL (CANCER CENTER ONLY)
Abs Immature Granulocytes: 0 10*3/uL (ref 0.00–0.07)
Basophils Absolute: 0 10*3/uL (ref 0.0–0.1)
Basophils Relative: 1 %
Eosinophils Absolute: 0 10*3/uL (ref 0.0–0.5)
Eosinophils Relative: 1 %
HCT: 29.9 % — ABNORMAL LOW (ref 39.0–52.0)
Hemoglobin: 9.7 g/dL — ABNORMAL LOW (ref 13.0–17.0)
Immature Granulocytes: 0 %
Lymphocytes Relative: 41 %
Lymphs Abs: 0.7 10*3/uL (ref 0.7–4.0)
MCH: 30 pg (ref 26.0–34.0)
MCHC: 32.4 g/dL (ref 30.0–36.0)
MCV: 92.6 fL (ref 80.0–100.0)
Monocytes Absolute: 0.1 10*3/uL (ref 0.1–1.0)
Monocytes Relative: 3 %
Neutro Abs: 1 10*3/uL — ABNORMAL LOW (ref 1.7–7.7)
Neutrophils Relative %: 54 %
Platelet Count: 154 10*3/uL (ref 150–400)
RBC: 3.23 MIL/uL — ABNORMAL LOW (ref 4.22–5.81)
RDW: 17.8 % — ABNORMAL HIGH (ref 11.5–15.5)
WBC Count: 1.7 10*3/uL — ABNORMAL LOW (ref 4.0–10.5)
nRBC: 0 % (ref 0.0–0.2)

## 2021-10-12 MED ORDER — HEPARIN SOD (PORK) LOCK FLUSH 100 UNIT/ML IV SOLN
500.0000 [IU] | Freq: Once | INTRAVENOUS | Status: AC
  Administered 2021-10-12: 500 [IU] via INTRAVENOUS

## 2021-10-12 MED ORDER — SODIUM CHLORIDE 0.9% FLUSH
10.0000 mL | INTRAVENOUS | Status: DC | PRN
  Administered 2021-10-12: 10 mL via INTRAVENOUS

## 2021-10-12 NOTE — Telephone Encounter (Signed)
Late entry for 10/11/2021: Provider reviewed e-mail from [place] .  Letter sent per collaborative indicating patient may not lift more than twenty pounds and has connected with [name].  This nurse faxed information to [facility].  Original mailed to address on file. [place] [phone] Copy to bin for H.I.M. scanning completes process.  No further instructions received, actions required or performed by this nurse.

## 2021-10-12 NOTE — Patient Instructions (Signed)

## 2021-10-13 ENCOUNTER — Other Ambulatory Visit: Payer: Self-pay | Admitting: Physician Assistant

## 2021-10-13 ENCOUNTER — Telehealth: Payer: Self-pay

## 2021-10-13 DIAGNOSIS — H52209 Unspecified astigmatism, unspecified eye: Secondary | ICD-10-CM | POA: Diagnosis not present

## 2021-10-13 DIAGNOSIS — H5213 Myopia, bilateral: Secondary | ICD-10-CM | POA: Diagnosis not present

## 2021-10-13 DIAGNOSIS — Z95828 Presence of other vascular implants and grafts: Secondary | ICD-10-CM

## 2021-10-13 DIAGNOSIS — C8300 Small cell B-cell lymphoma, unspecified site: Secondary | ICD-10-CM

## 2021-10-13 DIAGNOSIS — H524 Presbyopia: Secondary | ICD-10-CM | POA: Diagnosis not present

## 2021-10-13 MED ORDER — LIDOCAINE-PRILOCAINE 2.5-2.5 % EX CREA: 1.0000 | TOPICAL_CREAM | CUTANEOUS | 0 refills | Status: AC | PRN

## 2021-10-13 NOTE — Telephone Encounter (Signed)
Pt LVM stating his pharmacy does not have a rx for EMLA cream.  Review of his records indicate the rx was printed and given to him.  Rx has now been electronically sent given pt did not take the original to the pharmacy.

## 2021-10-19 ENCOUNTER — Inpatient Hospital Stay: Payer: Medicare HMO

## 2021-10-19 ENCOUNTER — Other Ambulatory Visit: Payer: Self-pay

## 2021-10-19 ENCOUNTER — Encounter: Payer: Self-pay | Admitting: Internal Medicine

## 2021-10-19 DIAGNOSIS — Z95828 Presence of other vascular implants and grafts: Secondary | ICD-10-CM | POA: Insufficient documentation

## 2021-10-19 DIAGNOSIS — T451X5A Adverse effect of antineoplastic and immunosuppressive drugs, initial encounter: Secondary | ICD-10-CM

## 2021-10-19 DIAGNOSIS — Z5112 Encounter for antineoplastic immunotherapy: Secondary | ICD-10-CM | POA: Diagnosis not present

## 2021-10-19 DIAGNOSIS — D701 Agranulocytosis secondary to cancer chemotherapy: Secondary | ICD-10-CM | POA: Diagnosis not present

## 2021-10-19 DIAGNOSIS — C8300 Small cell B-cell lymphoma, unspecified site: Secondary | ICD-10-CM

## 2021-10-19 DIAGNOSIS — Z79899 Other long term (current) drug therapy: Secondary | ICD-10-CM | POA: Diagnosis not present

## 2021-10-19 DIAGNOSIS — C8308 Small cell B-cell lymphoma, lymph nodes of multiple sites: Secondary | ICD-10-CM | POA: Diagnosis not present

## 2021-10-19 LAB — CMP (CANCER CENTER ONLY)
ALT: 14 U/L (ref 0–44)
AST: 16 U/L (ref 15–41)
Albumin: 4 g/dL (ref 3.5–5.0)
Alkaline Phosphatase: 62 U/L (ref 38–126)
Anion gap: 5 (ref 5–15)
BUN: 14 mg/dL (ref 8–23)
CO2: 28 mmol/L (ref 22–32)
Calcium: 8.9 mg/dL (ref 8.9–10.3)
Chloride: 106 mmol/L (ref 98–111)
Creatinine: 0.84 mg/dL (ref 0.61–1.24)
GFR, Estimated: >60 mL/min (ref >60)
Glucose, Bld: 130 mg/dL — ABNORMAL HIGH (ref 70–99)
Potassium: 4.1 mmol/L (ref 3.5–5.1)
Sodium: 139 mmol/L (ref 135–145)
Total Bilirubin: 0.5 mg/dL (ref 0.3–1.2)
Total Protein: 6.7 g/dL (ref 6.5–8.1)

## 2021-10-19 LAB — CBC WITH DIFFERENTIAL (CANCER CENTER ONLY)
Abs Immature Granulocytes: 0.01 10*3/uL (ref 0.00–0.07)
Basophils Absolute: 0 10*3/uL (ref 0.0–0.1)
Basophils Relative: 1 %
Eosinophils Absolute: 0 10*3/uL (ref 0.0–0.5)
Eosinophils Relative: 1 %
HCT: 29.8 % — ABNORMAL LOW (ref 39.0–52.0)
Hemoglobin: 9.7 g/dL — ABNORMAL LOW (ref 13.0–17.0)
Immature Granulocytes: 0 %
Lymphocytes Relative: 30 %
Lymphs Abs: 0.9 10*3/uL (ref 0.7–4.0)
MCH: 29.8 pg (ref 26.0–34.0)
MCHC: 32.6 g/dL (ref 30.0–36.0)
MCV: 91.7 fL (ref 80.0–100.0)
Monocytes Absolute: 0.1 10*3/uL (ref 0.1–1.0)
Monocytes Relative: 3 %
Neutro Abs: 2 10*3/uL (ref 1.7–7.7)
Neutrophils Relative %: 65 %
Platelet Count: 173 10*3/uL (ref 150–400)
RBC: 3.25 MIL/uL — ABNORMAL LOW (ref 4.22–5.81)
RDW: 17.4 % — ABNORMAL HIGH (ref 11.5–15.5)
WBC Count: 3.1 10*3/uL — ABNORMAL LOW (ref 4.0–10.5)
nRBC: 0 % (ref 0.0–0.2)

## 2021-10-19 LAB — SAMPLE TO BLOOD BANK

## 2021-10-19 MED ORDER — HEPARIN SOD (PORK) LOCK FLUSH 100 UNIT/ML IV SOLN
500.0000 [IU] | Freq: Once | INTRAVENOUS | Status: AC
  Administered 2021-10-19: 500 [IU]

## 2021-10-19 MED ORDER — SODIUM CHLORIDE 0.9% FLUSH
10.0000 mL | Freq: Once | INTRAVENOUS | Status: AC
  Administered 2021-10-19: 10 mL

## 2021-10-21 ENCOUNTER — Telehealth: Payer: Self-pay

## 2021-10-21 NOTE — Telephone Encounter (Signed)
Pt LVM wanting to know if he is allowed to get the flue and COVID vaccines.  I have called the pt back and left a VM advising, yes, he can receive them, however he would need to receive them one week after his last tx or at least one week before his next treatment.

## 2021-10-26 ENCOUNTER — Inpatient Hospital Stay: Payer: Medicare HMO

## 2021-10-26 ENCOUNTER — Other Ambulatory Visit: Payer: Self-pay

## 2021-10-26 DIAGNOSIS — C8300 Small cell B-cell lymphoma, unspecified site: Secondary | ICD-10-CM

## 2021-10-26 DIAGNOSIS — C8308 Small cell B-cell lymphoma, lymph nodes of multiple sites: Secondary | ICD-10-CM | POA: Diagnosis not present

## 2021-10-26 DIAGNOSIS — Z5112 Encounter for antineoplastic immunotherapy: Secondary | ICD-10-CM | POA: Diagnosis not present

## 2021-10-26 DIAGNOSIS — T451X5A Adverse effect of antineoplastic and immunosuppressive drugs, initial encounter: Secondary | ICD-10-CM | POA: Diagnosis not present

## 2021-10-26 DIAGNOSIS — Z79899 Other long term (current) drug therapy: Secondary | ICD-10-CM | POA: Diagnosis not present

## 2021-10-26 DIAGNOSIS — D701 Agranulocytosis secondary to cancer chemotherapy: Secondary | ICD-10-CM

## 2021-10-26 DIAGNOSIS — Z95828 Presence of other vascular implants and grafts: Secondary | ICD-10-CM

## 2021-10-26 LAB — CMP (CANCER CENTER ONLY)
ALT: 20 U/L (ref 0–44)
AST: 18 U/L (ref 15–41)
Albumin: 4.3 g/dL (ref 3.5–5.0)
Alkaline Phosphatase: 59 U/L (ref 38–126)
Anion gap: 6 (ref 5–15)
BUN: 13 mg/dL (ref 8–23)
CO2: 28 mmol/L (ref 22–32)
Calcium: 9 mg/dL (ref 8.9–10.3)
Chloride: 103 mmol/L (ref 98–111)
Creatinine: 0.79 mg/dL (ref 0.61–1.24)
GFR, Estimated: >60 mL/min (ref >60)
Glucose, Bld: 126 mg/dL — ABNORMAL HIGH (ref 70–99)
Potassium: 3.8 mmol/L (ref 3.5–5.1)
Sodium: 137 mmol/L (ref 135–145)
Total Bilirubin: 1.2 mg/dL (ref 0.3–1.2)
Total Protein: 7.3 g/dL (ref 6.5–8.1)

## 2021-10-26 LAB — CBC WITH DIFFERENTIAL (CANCER CENTER ONLY)
Abs Immature Granulocytes: 0.01 10*3/uL (ref 0.00–0.07)
Basophils Absolute: 0 10*3/uL (ref 0.0–0.1)
Basophils Relative: 0 %
Eosinophils Absolute: 0 10*3/uL (ref 0.0–0.5)
Eosinophils Relative: 1 %
HCT: 30.6 % — ABNORMAL LOW (ref 39.0–52.0)
Hemoglobin: 10.3 g/dL — ABNORMAL LOW (ref 13.0–17.0)
Immature Granulocytes: 0 %
Lymphocytes Relative: 10 %
Lymphs Abs: 0.3 10*3/uL — ABNORMAL LOW (ref 0.7–4.0)
MCH: 30.3 pg (ref 26.0–34.0)
MCHC: 33.7 g/dL (ref 30.0–36.0)
MCV: 90 fL (ref 80.0–100.0)
Monocytes Absolute: 0.2 10*3/uL (ref 0.1–1.0)
Monocytes Relative: 6 %
Neutro Abs: 2.8 10*3/uL (ref 1.7–7.7)
Neutrophils Relative %: 83 %
Platelet Count: 180 10*3/uL (ref 150–400)
RBC: 3.4 MIL/uL — ABNORMAL LOW (ref 4.22–5.81)
RDW: 18.2 % — ABNORMAL HIGH (ref 11.5–15.5)
WBC Count: 3.3 10*3/uL — ABNORMAL LOW (ref 4.0–10.5)
nRBC: 0 % (ref 0.0–0.2)

## 2021-10-26 LAB — SAMPLE TO BLOOD BANK

## 2021-10-26 MED ORDER — HEPARIN SOD (PORK) LOCK FLUSH 100 UNIT/ML IV SOLN
500.0000 [IU] | Freq: Once | INTRAVENOUS | Status: AC
  Administered 2021-10-26: 500 [IU]

## 2021-10-26 MED ORDER — SODIUM CHLORIDE 0.9% FLUSH
10.0000 mL | Freq: Once | INTRAVENOUS | Status: AC
  Administered 2021-10-26: 10 mL

## 2021-10-29 ENCOUNTER — Other Ambulatory Visit: Payer: Self-pay | Admitting: Internal Medicine

## 2021-10-30 ENCOUNTER — Encounter: Payer: Self-pay | Admitting: Internal Medicine

## 2021-10-31 ENCOUNTER — Other Ambulatory Visit (HOSPITAL_COMMUNITY): Payer: Self-pay

## 2021-11-01 ENCOUNTER — Telehealth: Payer: Self-pay

## 2021-11-01 MED FILL — Dexamethasone Sodium Phosphate Inj 100 MG/10ML: INTRAMUSCULAR | Qty: 2 | Status: AC

## 2021-11-01 NOTE — Patient Instructions (Signed)
Visit Information  Thank you for taking time to speak with me today. Please do not hesitate to call if you require assistance.  Following are the goals we discussed today:   Goals Addressed             This Visit's Progress    COMPLETED: Care Coordination Activities       Care Coordination Interventions: Reviewed plan for disease management. Currently being followed and treated by the Oncology team d/t small lymphocytic lymphoma.  Reports adhering to treatment plan, receiving infusions, and attending medical appointments as scheduled. Reports hypertension has been well controlled. Reports monitoring as advised. Home readings have ranged in the 130's over 70's. Reports emphysema has been well controlled. Reviewed medications. Reports managing well. Denies concerns r/t medication management or prescription cost. Assessed social determinant of health barriers. Discussed need for annual visit with PCP. He will contact the clinic to schedule.        Joseph Moyer verbalized understanding of the information discussed during the telephonic outreach. Declined need for mailed instructions or resources.  Joseph Moyer will call for care coordination assistance as needed.  [facility] Management [phone]

## 2021-11-01 NOTE — Patient Outreach (Signed)
  Care Coordination   Initial Visit Note   11/01/2021 Name: Joseph Moyer MRN: [id number] DOB: 1940/03/19  Joseph Moyer is a 81 y.o. year old male who sees Joseph Carol, MD for primary care. I spoke with  Joseph Moyer by phone today.  What matters to the patients health and wellness today?  No Concerns Expressed    Goals Addressed             This Visit's Progress    COMPLETED: Care Coordination Activities       Care Coordination Interventions: Reviewed plan for disease management. Currently being followed and treated by the Oncology team d/t small lymphocytic lymphoma.  Reports adhering to treatment plan, receiving infusions, and attending medical appointments as scheduled. Reports hypertension has been well controlled. Reports monitoring as advised. Home readings have ranged in the 130's over 70's. Reports emphysema has been well controlled. Reviewed medications. Reports managing well. Denies concerns r/t medication management or prescription cost. Assessed social determinant of health barriers. Discussed need for annual visit with PCP. He will contact the clinic to schedule.        SDOH assessments and interventions completed:  Yes  SDOH Interventions Today    Flowsheet Row Most Recent Value  SDOH Interventions   Food Insecurity Interventions Intervention Not Indicated  Transportation Interventions Intervention Not Indicated        Care Coordination Interventions Activated:  Yes  Care Coordination Interventions:  Yes, provided   Follow up plan:  Joseph Moyer will call for care coordination assistance as needed.    Encounter Outcome:  Pt. Visit Completed   [place] Management [phone]

## 2021-11-02 ENCOUNTER — Encounter: Payer: Self-pay | Admitting: Internal Medicine

## 2021-11-02 ENCOUNTER — Other Ambulatory Visit: Payer: Medicare HMO

## 2021-11-02 ENCOUNTER — Inpatient Hospital Stay: Payer: Medicare HMO | Admitting: Internal Medicine

## 2021-11-02 ENCOUNTER — Other Ambulatory Visit: Payer: Self-pay

## 2021-11-02 ENCOUNTER — Inpatient Hospital Stay: Payer: Medicare HMO

## 2021-11-02 ENCOUNTER — Inpatient Hospital Stay: Payer: Medicare HMO | Attending: Physician Assistant

## 2021-11-02 VITALS — BP 143/71 | HR 79 | Temp 97.4°F | Resp 17 | Wt 219.6 lb

## 2021-11-02 VITALS — BP 145/88 | HR 85 | Temp 97.8°F | Resp 18

## 2021-11-02 DIAGNOSIS — D649 Anemia, unspecified: Secondary | ICD-10-CM | POA: Insufficient documentation

## 2021-11-02 DIAGNOSIS — Z5112 Encounter for antineoplastic immunotherapy: Secondary | ICD-10-CM | POA: Diagnosis not present

## 2021-11-02 DIAGNOSIS — D696 Thrombocytopenia, unspecified: Secondary | ICD-10-CM | POA: Diagnosis not present

## 2021-11-02 DIAGNOSIS — Z95828 Presence of other vascular implants and grafts: Secondary | ICD-10-CM

## 2021-11-02 DIAGNOSIS — T451X5A Adverse effect of antineoplastic and immunosuppressive drugs, initial encounter: Secondary | ICD-10-CM

## 2021-11-02 DIAGNOSIS — Z79899 Other long term (current) drug therapy: Secondary | ICD-10-CM | POA: Diagnosis not present

## 2021-11-02 DIAGNOSIS — C8308 Small cell B-cell lymphoma, lymph nodes of multiple sites: Secondary | ICD-10-CM | POA: Insufficient documentation

## 2021-11-02 DIAGNOSIS — C8300 Small cell B-cell lymphoma, unspecified site: Secondary | ICD-10-CM | POA: Diagnosis not present

## 2021-11-02 LAB — CBC WITH DIFFERENTIAL (CANCER CENTER ONLY)
Abs Immature Granulocytes: 0.01 10*3/uL (ref 0.00–0.07)
Basophils Absolute: 0 10*3/uL (ref 0.0–0.1)
Basophils Relative: 0 %
Eosinophils Absolute: 0.1 10*3/uL (ref 0.0–0.5)
Eosinophils Relative: 1 %
HCT: 30.2 % — ABNORMAL LOW (ref 39.0–52.0)
Hemoglobin: 10 g/dL — ABNORMAL LOW (ref 13.0–17.0)
Immature Granulocytes: 0 %
Lymphocytes Relative: 33 %
Lymphs Abs: 1.3 10*3/uL (ref 0.7–4.0)
MCH: 30 pg (ref 26.0–34.0)
MCHC: 33.1 g/dL (ref 30.0–36.0)
MCV: 90.7 fL (ref 80.0–100.0)
Monocytes Absolute: 0.3 10*3/uL (ref 0.1–1.0)
Monocytes Relative: 7 %
Neutro Abs: 2.3 10*3/uL (ref 1.7–7.7)
Neutrophils Relative %: 59 %
Platelet Count: 236 10*3/uL (ref 150–400)
RBC: 3.33 MIL/uL — ABNORMAL LOW (ref 4.22–5.81)
RDW: 17.9 % — ABNORMAL HIGH (ref 11.5–15.5)
WBC Count: 4 10*3/uL (ref 4.0–10.5)
nRBC: 0 % (ref 0.0–0.2)

## 2021-11-02 LAB — CMP (CANCER CENTER ONLY)
ALT: 29 U/L (ref 0–44)
AST: 21 U/L (ref 15–41)
Albumin: 4 g/dL (ref 3.5–5.0)
Alkaline Phosphatase: 57 U/L (ref 38–126)
Anion gap: 4 — ABNORMAL LOW (ref 5–15)
BUN: 11 mg/dL (ref 8–23)
CO2: 30 mmol/L (ref 22–32)
Calcium: 8.9 mg/dL (ref 8.9–10.3)
Chloride: 105 mmol/L (ref 98–111)
Creatinine: 0.86 mg/dL (ref 0.61–1.24)
GFR, Estimated: >60 mL/min (ref >60)
Glucose, Bld: 153 mg/dL — ABNORMAL HIGH (ref 70–99)
Potassium: 3.7 mmol/L (ref 3.5–5.1)
Sodium: 139 mmol/L (ref 135–145)
Total Bilirubin: 0.6 mg/dL (ref 0.3–1.2)
Total Protein: 6.5 g/dL (ref 6.5–8.1)

## 2021-11-02 MED ORDER — HEPARIN SOD (PORK) LOCK FLUSH 100 UNIT/ML IV SOLN
500.0000 [IU] | Freq: Once | INTRAVENOUS | Status: AC | PRN
  Administered 2021-11-02: 500 [IU]

## 2021-11-02 MED ORDER — DIPHENHYDRAMINE HCL 50 MG/ML IJ SOLN
50.0000 mg | Freq: Once | INTRAMUSCULAR | Status: AC
  Administered 2021-11-02: 50 mg via INTRAVENOUS
  Filled 2021-11-02: qty 1

## 2021-11-02 MED ORDER — FAMOTIDINE IN NACL 20-0.9 MG/50ML-% IV SOLN
20.0000 mg | Freq: Once | INTRAVENOUS | Status: AC
  Administered 2021-11-02: 20 mg via INTRAVENOUS
  Filled 2021-11-02: qty 50

## 2021-11-02 MED ORDER — SODIUM CHLORIDE 0.9 % IV SOLN
20.0000 mg | Freq: Once | INTRAVENOUS | Status: AC
  Administered 2021-11-02: 20 mg via INTRAVENOUS
  Filled 2021-11-02: qty 20

## 2021-11-02 MED ORDER — SODIUM CHLORIDE 0.9% FLUSH
10.0000 mL | INTRAVENOUS | Status: DC | PRN
  Administered 2021-11-02: 10 mL

## 2021-11-02 MED ORDER — ACETAMINOPHEN 325 MG PO TABS
650.0000 mg | ORAL_TABLET | Freq: Once | ORAL | Status: AC
  Administered 2021-11-02: 650 mg via ORAL
  Filled 2021-11-02: qty 2

## 2021-11-02 MED ORDER — SODIUM CHLORIDE 0.9 % IV SOLN
1000.0000 mg | Freq: Once | INTRAVENOUS | Status: AC
  Administered 2021-11-02: 1000 mg via INTRAVENOUS
  Filled 2021-11-02: qty 40

## 2021-11-02 MED ORDER — SODIUM CHLORIDE 0.9% FLUSH
10.0000 mL | Freq: Once | INTRAVENOUS | Status: AC
  Administered 2021-11-02: 10 mL

## 2021-11-02 MED ORDER — METHYLPREDNISOLONE SODIUM SUCC 125 MG IJ SOLR
60.0000 mg | Freq: Once | INTRAMUSCULAR | Status: AC
  Administered 2021-11-02: 60 mg via INTRAVENOUS
  Filled 2021-11-02: qty 2

## 2021-11-02 MED ORDER — FAMOTIDINE IN NACL 20-0.9 MG/50ML-% IV SOLN: 20.0000 mg | Freq: Once | INTRAVENOUS | Status: DC

## 2021-11-02 MED ORDER — SODIUM CHLORIDE 0.9 % IV SOLN: Freq: Once | INTRAVENOUS | Status: AC

## 2021-11-02 NOTE — Progress Notes (Signed)
Joseph Moyer:([phone]   Fax:([phone]  OFFICE PROGRESS NOTE  Joseph Moyer Carol, MD 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  PRIOR THERAPY: A tapered dose of prednisone but his anemia and thrombocytopenia were refractory to the steroids.  CURRENT THERAPY: Systemic treatment with obinutuzumab every 4 weeks for 6 months.  In addition the patient will be treatment with venetoclax initially with a starting dose to be increased gradually to the standard dose of 400 mg p.o. daily for a total of 1 year.  He is status post 2 cycles.  INTERVAL HISTORY: Joseph Moyer 81 y.o. male returns to the clinic today for follow-up visit.  The patient is feeling much better today with no concerning complaints.  He has been tolerating his treatment fairly well with no significant adverse effects.  He denied having any current chest pain, shortness of breath, cough or hemoptysis.  He has no nausea, vomiting, diarrhea or constipation.  He has no headache or visual changes.  He has no recent weight loss or night sweats.  He has been on venetoclax 100 mg p.o. daily.  He was supposed to be on 400 mg by this time.  He is here today for evaluation before starting the third dose of obinutuzumab.  MEDICAL HISTORY: Past Medical History:  Diagnosis Date   Allergy    Arthritis    CAD in native artery    COPD (chronic obstructive pulmonary disease) (HCC)    GERD (gastroesophageal reflux disease)    Hyperlipidemia    Hypertension    Mild carotid artery disease (HCC)    Small lymphocytic lymphoma (HCC)     ALLERGIES:  is allergic to obinutuzumab, oxycodone, ramipril, and rosuvastatin.  MEDICATIONS:  Current Outpatient Medications  Medication Sig Dispense Refill   allopurinol (ZYLOPRIM) 300 MG  tablet Take 1 tablet (300 mg total) by mouth 2 (two) times daily. 60 tablet 0   amLODipine (NORVASC) 5 MG tablet Take 5 mg by mouth daily.     atorvastatin (LIPITOR) 40 MG tablet Take 1 tablet (40 mg total) by mouth daily. 90 tablet 1   diphenhydrAMINE HCl, Sleep, 50 MG CAPS Take 50 mg by mouth at bedtime.     FeFum-FePoly-FA-B Cmp-C-Biot (INTEGRA PLUS) CAPS Take 1 capsule by mouth daily. 30 capsule 3   lidocaine-prilocaine (EMLA) cream Apply 1 Application topically as needed. 1-2 hours prior to treatment, apply a tsp of the numbing cream to the skin over the port site and cover with cling wrap or other wrap. Do not rub in the cream. Do not use on the area until the port site is healed . 30 g 0   losartan (COZAAR) 25 MG tablet Take 1 tablet (25 mg total) by mouth daily. 90 tablet 1   meloxicam (MOBIC) 15 MG tablet Take 15 mg by mouth daily as needed.     methocarbamol (ROBAXIN) 750 MG tablet Take 750 mg by mouth every 6 (six) hours as needed.     metoprolol succinate (TOPROL-XL) 25 MG 24 hr tablet TAKE 1 TABLET (25 MG TOTAL) BY MOUTH DAILY. 90 tablet 3   Naphazoline-Glycerin (CLEAR EYES REDNESS RELIEF OP) Place 1 drop into both eyes as needed.     omeprazole (PRILOSEC) 20 MG capsule TAKE 1 CAPSULE BY MOUTH EVERY DAY 30 capsule 1   OVER  THE COUNTER MEDICATION Apply 1 application topically daily as needed (pain). Hemp cream     prochlorperazine (COMPAZINE) 10 MG tablet TAKE 1 TABLET BY MOUTH EVERY 6 HOURS AS NEEDED FOR NAUSEA OR VOMITING. 30 tablet 0   tamsulosin (FLOMAX) 0.4 MG CAPS capsule Take 0.4 mg by mouth daily.     umeclidinium bromide (INCRUSE ELLIPTA) 62.5 MCG/INH AEPB Inhale 1 puff into the lungs daily. 60 each 5   venetoclax (VENCLEXTA) 10 & 50 & 100 MG Starter Pack Take 20 mg by mouth daily for 7 days, then 50 mg daily for 7 days, then 100 mg daily for 7 days, then 200 mg daily for 7 days. Take with food & water. 42 tablet 0   venetoclax (VENCLEXTA) 100 MG tablet Take 4 tablets (400 mg  total) by mouth daily. Tablets should be swallowed whole with a meal and a full glass of water. 120 tablet 3   No current facility-administered medications for this visit.    SURGICAL HISTORY:  Past Surgical History:  Procedure Laterality Date   BRONCHIAL NEEDLE ASPIRATION BIOPSY  03/12/2020   Procedure: BRONCHIAL NEEDLE ASPIRATION BIOPSIES;  Surgeon: Garner Nash, DO;  Location: [facility];  Service: Pulmonary;;   CIRCUMCISION     COLONOSCOPY WITH PROPOFOL N/A 05/10/2015   Procedure: COLONOSCOPY WITH PROPOFOL;  Surgeon: Garlan Fair, MD;  Location: [facility];  Service: Endoscopy;  Laterality: N/A;   DECOMPRESSIVE LUMBAR LAMINECTOMY LEVEL 1 N/A 06/15/2018   Procedure: Gordy Levan decompression L1-L2/ complete inferior facetectomyof L1. Posterior lateral arthrodesiswith autograft bone L1-L2.;  Surgeon: Melina Schools, MD;  Location: [facility];  Service: Orthopedics;  Laterality: N/A;   HEMORRHOID SURGERY     IR IMAGING GUIDED PORT INSERTION  09/29/2021   VIDEO BRONCHOSCOPY WITH ENDOBRONCHIAL ULTRASOUND N/A 03/12/2020   Procedure: VIDEO BRONCHOSCOPY WITH ENDOBRONCHIAL ULTRASOUND;  Surgeon: Garner Nash, DO;  Location: [facility];  Service: Pulmonary;  Laterality: N/A;    REVIEW OF SYSTEMS:  Constitutional: negative Eyes: negative Ears, nose, mouth, throat, and face: negative Respiratory: negative Cardiovascular: negative Gastrointestinal: negative Genitourinary:negative Integument/breast: negative Hematologic/lymphatic: negative Musculoskeletal:negative Neurological: negative Behavioral/Psych: negative Endocrine: negative Allergic/Immunologic: negative   PHYSICAL EXAMINATION: General appearance: alert, cooperative, fatigued, and no distress Head: Normocephalic, without obvious abnormality, atraumatic Neck: no adenopathy, no JVD, supple, symmetrical, trachea midline, and thyroid not enlarged, symmetric, no tenderness/mass/nodules Lymph nodes: Cervical, supraclavicular, and  axillary nodes normal. Resp: clear to auscultation bilaterally Back: symmetric, no curvature. ROM normal. No CVA tenderness. Cardio: regular rate and rhythm, S1, S2 normal, no murmur, click, rub or gallop GI: soft, non-tender; bowel sounds normal; no masses,  no organomegaly Extremities: extremities normal, atraumatic, no cyanosis or edema Neurologic: Alert and oriented X 3, normal strength and tone. Normal symmetric reflexes. Normal coordination and gait  ECOG PERFORMANCE STATUS: 1 - Symptomatic but completely ambulatory  Blood pressure (!) 143/71, pulse 79, temperature (!) 97.4 F (36.3 C), temperature source Oral, resp. rate 17, weight 219 lb 9 oz (99.6 kg), SpO2 98 %.  LABORATORY DATA: Lab Results  Component Value Date   WBC 3.3 (L) 10/26/2021   HGB 10.3 (L) 10/26/2021   HCT 30.6 (L) 10/26/2021   MCV 90.0 10/26/2021   PLT 180 10/26/2021      Chemistry      Component Value Date/Time   NA 137 10/26/2021 0936   NA 136 09/05/2021 1041   K 3.8 10/26/2021 0936   CL 103 10/26/2021 0936   CO2 28 10/26/2021 0936   BUN 13 10/26/2021  0936   BUN 15 09/05/2021 1041   CREATININE 0.79 10/26/2021 0936      Component Value Date/Time   CALCIUM 9.0 10/26/2021 0936   ALKPHOS 59 10/26/2021 0936   AST 18 10/26/2021 0936   ALT 20 10/26/2021 0936   BILITOT 1.2 10/26/2021 0936       RADIOGRAPHIC STUDIES: No results found.   ASSESSMENT AND PLAN: This is a very pleasant 81 years old African-American male recently diagnosed with a small lymphocytic lymphoma with generalized lymphadenopathy in the neck, chest, axilla, abdomen and pelvis. The core biopsy of the left inguinal lymph node was consistent with the above diagnosis. The patient was seen recently with worsening anemia and thrombocytopenia.  He has imaging studies that showed mildly progressive lymphadenopathy in the right axilla, porta hepatis, retroperitoneum and pelvis but not the mediastinal lymphadenopathy. The patient was  treated with a tapered dose of prednisone with no improvement in his anemia and thrombocytopenia. The patient definitely has indication now for treatment of his condition especially with the persistent refractory anemia and thrombocytopenia. The patient is currently on treatment with obinutuzumab and venetoclax.  He is status post 2 doses of obinutuzumab and he is currently on venetoclax 100 mg p.o. daily.  I advised the patient to increase his dose of venetoclax to 200 mg p.o. daily for 1 week followed by 400 mg p.o. daily after that. He will proceed with cycle #3 of obinutuzumab today as planned. I will see him back for follow-up visit in 4 weeks for evaluation before the next infusion. The patient will continue on allopurinol for the tumor lysis and we will continue to monitor his electrolytes closely. He was advised to call immediately if he has any other concerning symptoms in the interval. The patient voices understanding of current disease status and treatment options and is in agreement with the current care plan.  All questions were answered. The patient knows to call the clinic with any problems, questions or concerns. We can certainly see the patient much sooner if necessary.  The total time spent in the appointment was 30 minutes.  Disclaimer: This note was dictated with voice recognition software. Similar sounding words can inadvertently be transcribed and may not be corrected upon review.

## 2021-11-02 NOTE — Addendum Note (Signed)
Addended by: Tora Kindred on: 11/02/2021 11:41 AM   Modules accepted: Orders

## 2021-11-02 NOTE — Patient Instructions (Signed)
[facility]COL[facility]  Discharge Instructions: Thank you for choosing [facility] to provide your oncology and hematology care.   If you have a lab appointment with the Cancer Center, please go directly to the Cancer Center and check in at the registration area.   Wear comfortable clothing and clothing appropriate for easy access to any Portacath or PICC line.   We strive to give you quality time with your provider. You may need to reschedule your appointment if you arrive late (15 or more minutes).  Arriving late affects you and other patients whose appointments are after yours.  Also, if you miss three or more appointments without notifying the office, you may be dismissed from the clinic at the provider's discretion.      For prescription refill requests, have your pharmacy contact our office and allow 72 hours for refills to be completed.    Today you received the following chemotherapy and/or immunotherapy agents: Gazyva      To help prevent nausea and vomiting after your treatment, we encourage you to take your nausea medication as directed.  BELOW ARE SYMPTOMS THAT SHOULD BE REPORTED IMMEDIATELY: *FEVER GREATER THAN 100.4 F (38 C) OR HIGHER *CHILLS OR SWEATING *NAUSEA AND VOMITING THAT IS NOT CONTROLLED WITH YOUR NAUSEA MEDICATION *UNUSUAL SHORTNESS OF BREATH *UNUSUAL BRUISING OR BLEEDING *URINARY PROBLEMS (pain or burning when urinating, or frequent urination) *BOWEL PROBLEMS (unusual diarrhea, constipation, pain near the anus) TENDERNESS IN MOUTH AND THROAT WITH OR WITHOUT PRESENCE OF ULCERS (sore throat, sores in mouth, or a toothache) UNUSUAL RASH, SWELLING OR PAIN  UNUSUAL VAGINAL DISCHARGE OR ITCHING   Items with * indicate a potential emergency and should be followed up as soon as possible or go to the Emergency Department if any problems should occur.  Please show the CHEMOTHERAPY ALERT CARD or IMMUNOTHERAPY ALERT CARD at check-in to the  Emergency Department and triage nurse.  Should you have questions after your visit or need to cancel or reschedule your appointment, please contact [facility]  Dept: [phone]  and follow the prompts.  Office hours are 8:00 a.m. to 4:30 p.m. Monday - Friday. Please note that voicemails left after 4:00 p.m. may not be returned until the following business day.  We are closed weekends and major holidays. You have access to a nurse at all times for urgent questions. Please call the main number to the clinic Dept: [phone] and follow the prompts.   For any non-urgent questions, you may also contact your provider using MyChart. We now offer e-Visits for anyone 18 and older to request care online for non-urgent symptoms. For details visit [email or web address].   Also download the MyChart app! Go to the app store, search "MyChart", open the app, select [facility], and log in with your MyChart username and password.  Masks are optional in the cancer centers. If you would like for your care team to wear a mask while they are taking care of you, please let them know. You may have one support person who is at least 79 years old accompany you for your appointments. 

## 2021-11-04 ENCOUNTER — Other Ambulatory Visit: Payer: Self-pay | Admitting: Physician Assistant

## 2021-11-04 ENCOUNTER — Other Ambulatory Visit (HOSPITAL_COMMUNITY): Payer: Self-pay

## 2021-11-04 DIAGNOSIS — C8300 Small cell B-cell lymphoma, unspecified site: Secondary | ICD-10-CM

## 2021-11-07 ENCOUNTER — Telehealth: Payer: Self-pay | Admitting: Internal Medicine

## 2021-11-07 ENCOUNTER — Other Ambulatory Visit: Payer: Self-pay | Admitting: Physician Assistant

## 2021-11-07 DIAGNOSIS — C8300 Small cell B-cell lymphoma, unspecified site: Secondary | ICD-10-CM

## 2021-11-07 MED ORDER — OMEPRAZOLE 20 MG PO CPDR: 20.0000 mg | DELAYED_RELEASE_CAPSULE | Freq: Every day | ORAL | 0 refills | Status: DC

## 2021-11-07 NOTE — Telephone Encounter (Signed)
Called patient regarding upcoming October and November appointments, left a voicemail. 

## 2021-11-08 ENCOUNTER — Other Ambulatory Visit: Payer: Self-pay

## 2021-11-09 ENCOUNTER — Other Ambulatory Visit: Payer: Medicare HMO

## 2021-11-09 ENCOUNTER — Inpatient Hospital Stay: Payer: Medicare HMO

## 2021-11-09 ENCOUNTER — Other Ambulatory Visit: Payer: Self-pay

## 2021-11-09 VITALS — BP 130/63 | HR 83 | Temp 98.0°F | Resp 17

## 2021-11-09 DIAGNOSIS — Z95828 Presence of other vascular implants and grafts: Secondary | ICD-10-CM

## 2021-11-09 DIAGNOSIS — D701 Agranulocytosis secondary to cancer chemotherapy: Secondary | ICD-10-CM

## 2021-11-09 DIAGNOSIS — Z5112 Encounter for antineoplastic immunotherapy: Secondary | ICD-10-CM | POA: Diagnosis not present

## 2021-11-09 DIAGNOSIS — T451X5A Adverse effect of antineoplastic and immunosuppressive drugs, initial encounter: Secondary | ICD-10-CM

## 2021-11-09 DIAGNOSIS — C8308 Small cell B-cell lymphoma, lymph nodes of multiple sites: Secondary | ICD-10-CM | POA: Diagnosis not present

## 2021-11-09 DIAGNOSIS — D649 Anemia, unspecified: Secondary | ICD-10-CM | POA: Diagnosis not present

## 2021-11-09 DIAGNOSIS — Z79899 Other long term (current) drug therapy: Secondary | ICD-10-CM | POA: Diagnosis not present

## 2021-11-09 DIAGNOSIS — C8300 Small cell B-cell lymphoma, unspecified site: Secondary | ICD-10-CM

## 2021-11-09 DIAGNOSIS — D696 Thrombocytopenia, unspecified: Secondary | ICD-10-CM | POA: Diagnosis not present

## 2021-11-09 LAB — CMP (CANCER CENTER ONLY)
ALT: 18 U/L (ref 0–44)
AST: 15 U/L (ref 15–41)
Albumin: 4 g/dL (ref 3.5–5.0)
Alkaline Phosphatase: 60 U/L (ref 38–126)
Anion gap: 5 (ref 5–15)
BUN: 15 mg/dL (ref 8–23)
CO2: 29 mmol/L (ref 22–32)
Calcium: 8.8 mg/dL — ABNORMAL LOW (ref 8.9–10.3)
Chloride: 104 mmol/L (ref 98–111)
Creatinine: 0.86 mg/dL (ref 0.61–1.24)
GFR, Estimated: >60 mL/min (ref >60)
Glucose, Bld: 156 mg/dL — ABNORMAL HIGH (ref 70–99)
Potassium: 3.9 mmol/L (ref 3.5–5.1)
Sodium: 138 mmol/L (ref 135–145)
Total Bilirubin: 0.5 mg/dL (ref 0.3–1.2)
Total Protein: 6.8 g/dL (ref 6.5–8.1)

## 2021-11-09 LAB — CBC WITH DIFFERENTIAL (CANCER CENTER ONLY)
Abs Immature Granulocytes: 0 10*3/uL (ref 0.00–0.07)
Basophils Absolute: 0 10*3/uL (ref 0.0–0.1)
Basophils Relative: 0 %
Eosinophils Absolute: 0 10*3/uL (ref 0.0–0.5)
Eosinophils Relative: 1 %
HCT: 31.8 % — ABNORMAL LOW (ref 39.0–52.0)
Hemoglobin: 10.4 g/dL — ABNORMAL LOW (ref 13.0–17.0)
Immature Granulocytes: 0 %
Lymphocytes Relative: 22 %
Lymphs Abs: 1 10*3/uL (ref 0.7–4.0)
MCH: 29.7 pg (ref 26.0–34.0)
MCHC: 32.7 g/dL (ref 30.0–36.0)
MCV: 90.9 fL (ref 80.0–100.0)
Monocytes Absolute: 0.4 10*3/uL (ref 0.1–1.0)
Monocytes Relative: 9 %
Neutro Abs: 3 10*3/uL (ref 1.7–7.7)
Neutrophils Relative %: 68 %
Platelet Count: 265 10*3/uL (ref 150–400)
RBC: 3.5 MIL/uL — ABNORMAL LOW (ref 4.22–5.81)
RDW: 17.9 % — ABNORMAL HIGH (ref 11.5–15.5)
WBC Count: 4.5 10*3/uL (ref 4.0–10.5)
nRBC: 0 % (ref 0.0–0.2)

## 2021-11-09 MED ORDER — SODIUM CHLORIDE 0.9% FLUSH
10.0000 mL | Freq: Once | INTRAVENOUS | Status: AC
  Administered 2021-11-09: 10 mL

## 2021-11-09 MED ORDER — HEPARIN SOD (PORK) LOCK FLUSH 100 UNIT/ML IV SOLN
500.0000 [IU] | Freq: Once | INTRAVENOUS | Status: AC
  Administered 2021-11-09: 500 [IU]

## 2021-11-16 ENCOUNTER — Other Ambulatory Visit: Payer: Medicare HMO

## 2021-11-16 ENCOUNTER — Inpatient Hospital Stay: Payer: Medicare HMO

## 2021-11-16 DIAGNOSIS — D649 Anemia, unspecified: Secondary | ICD-10-CM | POA: Diagnosis not present

## 2021-11-16 DIAGNOSIS — T451X5A Adverse effect of antineoplastic and immunosuppressive drugs, initial encounter: Secondary | ICD-10-CM

## 2021-11-16 DIAGNOSIS — C8300 Small cell B-cell lymphoma, unspecified site: Secondary | ICD-10-CM

## 2021-11-16 DIAGNOSIS — Z95828 Presence of other vascular implants and grafts: Secondary | ICD-10-CM

## 2021-11-16 DIAGNOSIS — D696 Thrombocytopenia, unspecified: Secondary | ICD-10-CM | POA: Diagnosis not present

## 2021-11-16 DIAGNOSIS — Z5112 Encounter for antineoplastic immunotherapy: Secondary | ICD-10-CM | POA: Diagnosis not present

## 2021-11-16 DIAGNOSIS — C8308 Small cell B-cell lymphoma, lymph nodes of multiple sites: Secondary | ICD-10-CM | POA: Diagnosis not present

## 2021-11-16 DIAGNOSIS — Z79899 Other long term (current) drug therapy: Secondary | ICD-10-CM | POA: Diagnosis not present

## 2021-11-16 LAB — CBC WITH DIFFERENTIAL (CANCER CENTER ONLY)
Abs Immature Granulocytes: 0.01 10*3/uL (ref 0.00–0.07)
Basophils Absolute: 0 10*3/uL (ref 0.0–0.1)
Basophils Relative: 0 %
Eosinophils Absolute: 0 10*3/uL (ref 0.0–0.5)
Eosinophils Relative: 1 %
HCT: 32.9 % — ABNORMAL LOW (ref 39.0–52.0)
Hemoglobin: 10.9 g/dL — ABNORMAL LOW (ref 13.0–17.0)
Immature Granulocytes: 0 %
Lymphocytes Relative: 25 %
Lymphs Abs: 1.4 10*3/uL (ref 0.7–4.0)
MCH: 29.9 pg (ref 26.0–34.0)
MCHC: 33.1 g/dL (ref 30.0–36.0)
MCV: 90.1 fL (ref 80.0–100.0)
Monocytes Absolute: 0.6 10*3/uL (ref 0.1–1.0)
Monocytes Relative: 10 %
Neutro Abs: 3.5 10*3/uL (ref 1.7–7.7)
Neutrophils Relative %: 64 %
Platelet Count: 253 10*3/uL (ref 150–400)
RBC: 3.65 MIL/uL — ABNORMAL LOW (ref 4.22–5.81)
RDW: 17.3 % — ABNORMAL HIGH (ref 11.5–15.5)
WBC Count: 5.6 10*3/uL (ref 4.0–10.5)
nRBC: 0 % (ref 0.0–0.2)

## 2021-11-16 LAB — CMP (CANCER CENTER ONLY)
ALT: 19 U/L (ref 0–44)
AST: 18 U/L (ref 15–41)
Albumin: 4.1 g/dL (ref 3.5–5.0)
Alkaline Phosphatase: 64 U/L (ref 38–126)
Anion gap: 5 (ref 5–15)
BUN: 14 mg/dL (ref 8–23)
CO2: 28 mmol/L (ref 22–32)
Calcium: 8.8 mg/dL — ABNORMAL LOW (ref 8.9–10.3)
Chloride: 106 mmol/L (ref 98–111)
Creatinine: 0.88 mg/dL (ref 0.61–1.24)
GFR, Estimated: >60 mL/min (ref >60)
Glucose, Bld: 135 mg/dL — ABNORMAL HIGH (ref 70–99)
Potassium: 3.9 mmol/L (ref 3.5–5.1)
Sodium: 139 mmol/L (ref 135–145)
Total Bilirubin: 0.6 mg/dL (ref 0.3–1.2)
Total Protein: 7 g/dL (ref 6.5–8.1)

## 2021-11-16 LAB — SAMPLE TO BLOOD BANK

## 2021-11-16 MED ORDER — HEPARIN SOD (PORK) LOCK FLUSH 100 UNIT/ML IV SOLN
500.0000 [IU] | Freq: Once | INTRAVENOUS | Status: AC
  Administered 2021-11-16: 500 [IU]

## 2021-11-16 MED ORDER — SODIUM CHLORIDE 0.9% FLUSH
10.0000 mL | Freq: Once | INTRAVENOUS | Status: AC
  Administered 2021-11-16: 10 mL

## 2021-11-23 ENCOUNTER — Inpatient Hospital Stay: Payer: Medicare HMO

## 2021-11-23 ENCOUNTER — Other Ambulatory Visit: Payer: Self-pay

## 2021-11-23 ENCOUNTER — Other Ambulatory Visit: Payer: Medicare HMO

## 2021-11-23 DIAGNOSIS — T451X5A Adverse effect of antineoplastic and immunosuppressive drugs, initial encounter: Secondary | ICD-10-CM

## 2021-11-23 DIAGNOSIS — C8308 Small cell B-cell lymphoma, lymph nodes of multiple sites: Secondary | ICD-10-CM | POA: Diagnosis not present

## 2021-11-23 DIAGNOSIS — D649 Anemia, unspecified: Secondary | ICD-10-CM | POA: Diagnosis not present

## 2021-11-23 DIAGNOSIS — Z79899 Other long term (current) drug therapy: Secondary | ICD-10-CM | POA: Diagnosis not present

## 2021-11-23 DIAGNOSIS — Z5112 Encounter for antineoplastic immunotherapy: Secondary | ICD-10-CM | POA: Diagnosis not present

## 2021-11-23 DIAGNOSIS — Z95828 Presence of other vascular implants and grafts: Secondary | ICD-10-CM

## 2021-11-23 DIAGNOSIS — D696 Thrombocytopenia, unspecified: Secondary | ICD-10-CM | POA: Diagnosis not present

## 2021-11-23 DIAGNOSIS — C8300 Small cell B-cell lymphoma, unspecified site: Secondary | ICD-10-CM

## 2021-11-23 LAB — CBC WITH DIFFERENTIAL (CANCER CENTER ONLY)
Abs Immature Granulocytes: 0.03 10*3/uL (ref 0.00–0.07)
Basophils Absolute: 0 10*3/uL (ref 0.0–0.1)
Basophils Relative: 1 %
Eosinophils Absolute: 0.1 10*3/uL (ref 0.0–0.5)
Eosinophils Relative: 2 %
HCT: 33.9 % — ABNORMAL LOW (ref 39.0–52.0)
Hemoglobin: 11.2 g/dL — ABNORMAL LOW (ref 13.0–17.0)
Immature Granulocytes: 1 %
Lymphocytes Relative: 35 %
Lymphs Abs: 2 10*3/uL (ref 0.7–4.0)
MCH: 29.6 pg (ref 26.0–34.0)
MCHC: 33 g/dL (ref 30.0–36.0)
MCV: 89.7 fL (ref 80.0–100.0)
Monocytes Absolute: 0.8 10*3/uL (ref 0.1–1.0)
Monocytes Relative: 13 %
Neutro Abs: 2.9 10*3/uL (ref 1.7–7.7)
Neutrophils Relative %: 48 %
Platelet Count: 257 10*3/uL (ref 150–400)
RBC: 3.78 MIL/uL — ABNORMAL LOW (ref 4.22–5.81)
RDW: 17 % — ABNORMAL HIGH (ref 11.5–15.5)
WBC Count: 5.8 10*3/uL (ref 4.0–10.5)
nRBC: 0 % (ref 0.0–0.2)

## 2021-11-23 LAB — CMP (CANCER CENTER ONLY)
ALT: 21 U/L (ref 0–44)
AST: 19 U/L (ref 15–41)
Albumin: 4 g/dL (ref 3.5–5.0)
Alkaline Phosphatase: 65 U/L (ref 38–126)
Anion gap: 5 (ref 5–15)
BUN: 14 mg/dL (ref 8–23)
CO2: 28 mmol/L (ref 22–32)
Calcium: 9 mg/dL (ref 8.9–10.3)
Chloride: 106 mmol/L (ref 98–111)
Creatinine: 0.9 mg/dL (ref 0.61–1.24)
GFR, Estimated: >60 mL/min (ref >60)
Glucose, Bld: 109 mg/dL — ABNORMAL HIGH (ref 70–99)
Potassium: 4 mmol/L (ref 3.5–5.1)
Sodium: 139 mmol/L (ref 135–145)
Total Bilirubin: 0.5 mg/dL (ref 0.3–1.2)
Total Protein: 6.9 g/dL (ref 6.5–8.1)

## 2021-11-23 MED ORDER — SODIUM CHLORIDE 0.9% FLUSH
10.0000 mL | Freq: Once | INTRAVENOUS | Status: AC
  Administered 2021-11-23: 10 mL

## 2021-11-23 MED ORDER — HEPARIN SOD (PORK) LOCK FLUSH 100 UNIT/ML IV SOLN
500.0000 [IU] | Freq: Once | INTRAVENOUS | Status: AC
  Administered 2021-11-23: 500 [IU]

## 2021-11-28 ENCOUNTER — Other Ambulatory Visit (HOSPITAL_COMMUNITY): Payer: Self-pay

## 2021-11-29 MED FILL — Dexamethasone Sodium Phosphate Inj 100 MG/10ML: INTRAMUSCULAR | Qty: 2 | Status: AC

## 2021-11-30 ENCOUNTER — Inpatient Hospital Stay: Payer: Medicare HMO | Attending: Physician Assistant

## 2021-11-30 ENCOUNTER — Inpatient Hospital Stay: Payer: Medicare HMO | Admitting: Internal Medicine

## 2021-11-30 ENCOUNTER — Inpatient Hospital Stay: Payer: Medicare HMO

## 2021-11-30 ENCOUNTER — Other Ambulatory Visit: Payer: Self-pay

## 2021-11-30 ENCOUNTER — Other Ambulatory Visit (HOSPITAL_COMMUNITY): Payer: Self-pay

## 2021-11-30 ENCOUNTER — Other Ambulatory Visit: Payer: Medicare HMO

## 2021-11-30 VITALS — BP 133/84 | HR 88 | Temp 97.8°F | Resp 16

## 2021-11-30 DIAGNOSIS — C8300 Small cell B-cell lymphoma, unspecified site: Secondary | ICD-10-CM

## 2021-11-30 DIAGNOSIS — Z5112 Encounter for antineoplastic immunotherapy: Secondary | ICD-10-CM | POA: Diagnosis not present

## 2021-11-30 DIAGNOSIS — Z79899 Other long term (current) drug therapy: Secondary | ICD-10-CM | POA: Insufficient documentation

## 2021-11-30 DIAGNOSIS — D649 Anemia, unspecified: Secondary | ICD-10-CM | POA: Diagnosis not present

## 2021-11-30 DIAGNOSIS — Z95828 Presence of other vascular implants and grafts: Secondary | ICD-10-CM

## 2021-11-30 DIAGNOSIS — C8308 Small cell B-cell lymphoma, lymph nodes of multiple sites: Secondary | ICD-10-CM | POA: Insufficient documentation

## 2021-11-30 DIAGNOSIS — T451X5A Adverse effect of antineoplastic and immunosuppressive drugs, initial encounter: Secondary | ICD-10-CM

## 2021-11-30 LAB — CBC WITH DIFFERENTIAL (CANCER CENTER ONLY)
Abs Immature Granulocytes: 0.02 10*3/uL (ref 0.00–0.07)
Basophils Absolute: 0 10*3/uL (ref 0.0–0.1)
Basophils Relative: 0 %
Eosinophils Absolute: 0.2 10*3/uL (ref 0.0–0.5)
Eosinophils Relative: 3 %
HCT: 34 % — ABNORMAL LOW (ref 39.0–52.0)
Hemoglobin: 11.2 g/dL — ABNORMAL LOW (ref 13.0–17.0)
Immature Granulocytes: 0 %
Lymphocytes Relative: 37 %
Lymphs Abs: 2.5 10*3/uL (ref 0.7–4.0)
MCH: 29.4 pg (ref 26.0–34.0)
MCHC: 32.9 g/dL (ref 30.0–36.0)
MCV: 89.2 fL (ref 80.0–100.0)
Monocytes Absolute: 0.6 10*3/uL (ref 0.1–1.0)
Monocytes Relative: 9 %
Neutro Abs: 3.3 10*3/uL (ref 1.7–7.7)
Neutrophils Relative %: 51 %
Platelet Count: 241 10*3/uL (ref 150–400)
RBC: 3.81 MIL/uL — ABNORMAL LOW (ref 4.22–5.81)
RDW: 16.6 % — ABNORMAL HIGH (ref 11.5–15.5)
WBC Count: 6.7 10*3/uL (ref 4.0–10.5)
nRBC: 0 % (ref 0.0–0.2)

## 2021-11-30 LAB — CMP (CANCER CENTER ONLY)
ALT: 18 U/L (ref 0–44)
AST: 17 U/L (ref 15–41)
Albumin: 4.1 g/dL (ref 3.5–5.0)
Alkaline Phosphatase: 62 U/L (ref 38–126)
Anion gap: 5 (ref 5–15)
BUN: 16 mg/dL (ref 8–23)
CO2: 28 mmol/L (ref 22–32)
Calcium: 9 mg/dL (ref 8.9–10.3)
Chloride: 106 mmol/L (ref 98–111)
Creatinine: 0.91 mg/dL (ref 0.61–1.24)
GFR, Estimated: >60 mL/min (ref >60)
Glucose, Bld: 139 mg/dL — ABNORMAL HIGH (ref 70–99)
Potassium: 3.8 mmol/L (ref 3.5–5.1)
Sodium: 139 mmol/L (ref 135–145)
Total Bilirubin: 0.6 mg/dL (ref 0.3–1.2)
Total Protein: 7.2 g/dL (ref 6.5–8.1)

## 2021-11-30 MED ORDER — HEPARIN SOD (PORK) LOCK FLUSH 100 UNIT/ML IV SOLN
500.0000 [IU] | Freq: Once | INTRAVENOUS | Status: AC | PRN
  Administered 2021-11-30: 500 [IU]

## 2021-11-30 MED ORDER — SODIUM CHLORIDE 0.9 % IV SOLN
20.0000 mg | Freq: Once | INTRAVENOUS | Status: AC
  Administered 2021-11-30: 20 mg via INTRAVENOUS
  Filled 2021-11-30: qty 20

## 2021-11-30 MED ORDER — SODIUM CHLORIDE 0.9 % IV SOLN: Freq: Once | INTRAVENOUS | Status: AC

## 2021-11-30 MED ORDER — METHYLPREDNISOLONE SODIUM SUCC 125 MG IJ SOLR
60.0000 mg | Freq: Once | INTRAMUSCULAR | Status: AC
  Administered 2021-11-30: 60 mg via INTRAVENOUS
  Filled 2021-11-30: qty 2

## 2021-11-30 MED ORDER — SODIUM CHLORIDE 0.9% FLUSH
10.0000 mL | Freq: Once | INTRAVENOUS | Status: AC
  Administered 2021-11-30: 10 mL

## 2021-11-30 MED ORDER — DIPHENHYDRAMINE HCL 50 MG/ML IJ SOLN
50.0000 mg | Freq: Once | INTRAMUSCULAR | Status: AC
  Administered 2021-11-30: 50 mg via INTRAVENOUS
  Filled 2021-11-30: qty 1

## 2021-11-30 MED ORDER — FAMOTIDINE IN NACL 20-0.9 MG/50ML-% IV SOLN
20.0000 mg | Freq: Once | INTRAVENOUS | Status: AC
  Administered 2021-11-30: 20 mg via INTRAVENOUS
  Filled 2021-11-30: qty 50

## 2021-11-30 MED ORDER — SODIUM CHLORIDE 0.9% FLUSH
10.0000 mL | INTRAVENOUS | Status: DC | PRN
  Administered 2021-11-30: 10 mL

## 2021-11-30 MED ORDER — ACETAMINOPHEN 325 MG PO TABS
650.0000 mg | ORAL_TABLET | Freq: Once | ORAL | Status: AC
  Administered 2021-11-30: 650 mg via ORAL
  Filled 2021-11-30: qty 2

## 2021-11-30 MED ORDER — SODIUM CHLORIDE 0.9 % IV SOLN
1000.0000 mg | Freq: Once | INTRAVENOUS | Status: AC
  Administered 2021-11-30: 1000 mg via INTRAVENOUS
  Filled 2021-11-30: qty 40

## 2021-11-30 NOTE — Patient Instructions (Signed)
[name]  Discharge Instructions: Thank you for choosing [facility] to provide your oncology and hematology care.   If you have a lab appointment with the [facility], please go directly to the [facility] and check in at the registration area.   Wear comfortable clothing and clothing appropriate for easy access to any Portacath or PICC line.   We strive to give you quality time with your provider. You may need to reschedule your appointment if you arrive late (15 or more minutes).  Arriving late affects you and other patients whose appointments are after yours.  Also, if you miss three or more appointments without notifying the office, you may be dismissed from the clinic at the provider's discretion.      For prescription refill requests, have your pharmacy contact our office and allow 72 hours for refills to be completed.    Today you received the following chemotherapy and/or immunotherapy agents: Gazyva      To help prevent nausea and vomiting after your treatment, we encourage you to take your nausea medication as directed.  BELOW ARE SYMPTOMS THAT SHOULD BE REPORTED IMMEDIATELY: *FEVER GREATER THAN 100.4 F (38 C) OR HIGHER *CHILLS OR SWEATING *NAUSEA AND VOMITING THAT IS NOT CONTROLLED WITH YOUR NAUSEA MEDICATION *UNUSUAL SHORTNESS OF BREATH *UNUSUAL BRUISING OR BLEEDING *URINARY PROBLEMS (pain or burning when urinating, or frequent urination) *BOWEL PROBLEMS (unusual diarrhea, constipation, pain near the anus) TENDERNESS IN MOUTH AND THROAT WITH OR WITHOUT PRESENCE OF ULCERS (sore throat, sores in mouth, or a toothache) UNUSUAL RASH, SWELLING OR PAIN  UNUSUAL VAGINAL DISCHARGE OR ITCHING   Items with * indicate a potential emergency and should be followed up as soon as possible or go to the Emergency Department if any problems should occur.  Please show the CHEMOTHERAPY ALERT CARD or IMMUNOTHERAPY ALERT CARD at check-in to the  Emergency Department and triage nurse.  Should you have questions after your visit or need to cancel or reschedule your appointment, please contact [facility]  Dept: [phone]  and follow the prompts.  Office hours are 8:00 a.m. to 4:30 p.m. Monday - Friday. Please note that voicemails left after 4:00 p.m. may not be returned until the following business day.  We are closed weekends and major holidays. You have access to a nurse at all times for urgent questions. Please call the main number to the clinic Dept: [phone] and follow the prompts.   For any non-urgent questions, you may also contact your provider using MyChart. We now offer e-Visits for anyone 41 and older to request care online for non-urgent symptoms. For details visit [email or web address].   Also download the MyChart app! Go to the app store, search "MyChart", open the app, select [facility], and log in with your MyChart username and password.  Masks are optional in the cancer centers. If you would like for your care team to wear a mask while they are taking care of you, please let them know. You may have one support person who is at least 79 years old accompany you for your appointments.

## 2021-11-30 NOTE — Progress Notes (Signed)
[facility] Telephone:(336) [phone]   Fax:(336) [phone]  OFFICE PROGRESS NOTE  [name], MD 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  PRIOR THERAPY: A tapered dose of prednisone but his anemia and thrombocytopenia were refractory to the steroids.  CURRENT THERAPY: Systemic treatment with obinutuzumab every 4 weeks for 6 months.  In addition the patient will be treatment with venetoclax initially with a starting dose to be increased gradually to the standard dose of 400 mg p.o. daily for a total of 1 year.  He is status post 3 cycles.  INTERVAL HISTORY: Joseph Moyer 81 y.o. male returns to the clinic today for follow-up visit.  The patient is feeling fine today with no concerning complaints.  He celebrated his 81th birthday few days ago.  He denied having any chest pain, shortness of breath, cough or hemoptysis.  He has no nausea, vomiting, diarrhea or constipation.  He denied having any significant weight loss or night sweats.  He has no headache or visual changes.  He is here today for evaluation before starting cycle 4 of his treatment with obinutuzumab.  He has been on venetoclax 200 mg p.o. daily.  MEDICAL HISTORY: Past Medical History:  Diagnosis Date   Allergy    Arthritis    CAD in native artery    COPD (chronic obstructive pulmonary disease) (HCC)    GERD (gastroesophageal reflux disease)    Hyperlipidemia    Hypertension    Mild carotid artery disease (HCC)    Small lymphocytic lymphoma (HCC)     ALLERGIES:  is allergic to obinutuzumab, oxycodone, ramipril, and rosuvastatin.  MEDICATIONS:  Current Outpatient Medications  Medication Sig Dispense Refill   allopurinol (ZYLOPRIM) 300 MG tablet Take 1 tablet (300 mg total) by mouth 2 (two) times daily. 60  tablet 0   amLODipine (NORVASC) 5 MG tablet Take 5 mg by mouth daily.     atorvastatin (LIPITOR) 40 MG tablet Take 1 tablet (40 mg total) by mouth daily. 90 tablet 1   diphenhydrAMINE HCl, Sleep, 50 MG CAPS Take 50 mg by mouth at bedtime.     FeFum-FePoly-FA-B Cmp-C-Biot (INTEGRA PLUS) CAPS Take 1 capsule by mouth daily. 30 capsule 3   lidocaine-prilocaine (EMLA) cream Apply 1 Application topically as needed. 1-2 hours prior to treatment, apply a tsp of the numbing cream to the skin over the port site and cover with cling wrap or other wrap. Do not rub in the cream. Do not use on the area until the port site is healed . 30 g 0   losartan (COZAAR) 25 MG tablet Take 1 tablet (25 mg total) by mouth daily. 90 tablet 1   meloxicam (MOBIC) 15 MG tablet Take 15 mg by mouth daily as needed.     methocarbamol (ROBAXIN) 750 MG tablet Take 750 mg by mouth every 6 (six) hours as needed.     metoprolol succinate (TOPROL-XL) 25 MG 24 hr tablet TAKE 1 TABLET (25 MG TOTAL) BY MOUTH DAILY. 90 tablet 3   Naphazoline-Glycerin (CLEAR EYES REDNESS RELIEF OP) Place 1 drop into both eyes as needed.     omeprazole (PRILOSEC) 20 MG capsule Take 1 capsule (20 mg total) by mouth daily. 90 capsule 0   tamsulosin (FLOMAX) 0.4 MG CAPS capsule Take 0.4 mg by mouth daily.  umeclidinium bromide (INCRUSE ELLIPTA) 62.5 MCG/INH AEPB Inhale 1 puff into the lungs daily. 60 each 5   prochlorperazine (COMPAZINE) 10 MG tablet TAKE 1 TABLET BY MOUTH EVERY 6 HOURS AS NEEDED FOR NAUSEA OR VOMITING. (Patient not taking: Reported on 11/30/2021) 30 tablet 0   venetoclax (VENCLEXTA) 100 MG tablet Take 4 tablets (400 mg total) by mouth daily. Tablets should be swallowed whole with a meal and a full glass of water. (Patient not taking: Reported on 11/02/2021) 120 tablet 3   No current facility-administered medications for this visit.    SURGICAL HISTORY:  Past Surgical History:  Procedure Laterality Date   BRONCHIAL NEEDLE ASPIRATION BIOPSY   03/12/2020   Procedure: BRONCHIAL NEEDLE ASPIRATION BIOPSIES;  Surgeon: Garner Nash, DO;  Location: [facility];  Service: Pulmonary;;   CIRCUMCISION     COLONOSCOPY WITH PROPOFOL N/A 05/10/2015   Procedure: COLONOSCOPY WITH PROPOFOL;  Surgeon: Garlan Fair, MD;  Location: [facility];  Service: Endoscopy;  Laterality: N/A;   DECOMPRESSIVE LUMBAR LAMINECTOMY LEVEL 1 N/A 06/15/2018   Procedure: Gordy Levan decompression L1-L2/ complete inferior facetectomyof L1. Posterior lateral arthrodesiswith autograft bone L1-L2.;  Surgeon: Melina Schools, MD;  Location: [facility];  Service: Orthopedics;  Laterality: N/A;   HEMORRHOID SURGERY     IR IMAGING GUIDED PORT INSERTION  09/29/2021   VIDEO BRONCHOSCOPY WITH ENDOBRONCHIAL ULTRASOUND N/A 03/12/2020   Procedure: VIDEO BRONCHOSCOPY WITH ENDOBRONCHIAL ULTRASOUND;  Surgeon: Garner Nash, DO;  Location: [facility];  Service: Pulmonary;  Laterality: N/A;    REVIEW OF SYSTEMS:  A comprehensive review of systems was negative.   PHYSICAL EXAMINATION: General appearance: alert, cooperative, and no distress Head: Normocephalic, without obvious abnormality, atraumatic Neck: no adenopathy, no JVD, supple, symmetrical, trachea midline, and thyroid not enlarged, symmetric, no tenderness/mass/nodules Lymph nodes: Cervical, supraclavicular, and axillary nodes normal. Resp: clear to auscultation bilaterally Back: symmetric, no curvature. ROM normal. No CVA tenderness. Cardio: regular rate and rhythm, S1, S2 normal, no murmur, click, rub or gallop GI: soft, non-tender; bowel sounds normal; no masses,  no organomegaly Extremities: extremities normal, atraumatic, no cyanosis or edema  ECOG PERFORMANCE STATUS: 1 - Symptomatic but completely ambulatory  Blood pressure 132/71, pulse 86, temperature 97.7 F (36.5 C), temperature source Oral, resp. rate 15, height 6' (1.829 m), weight 220 lb 4.8 oz (99.9 kg), SpO2 99 %.  LABORATORY DATA: Lab Results  Component Value  Date   WBC 6.7 11/30/2021   HGB 11.2 (L) 11/30/2021   HCT 34.0 (L) 11/30/2021   MCV 89.2 11/30/2021   PLT 241 11/30/2021      Chemistry      Component Value Date/Time   NA 139 11/30/2021 1039   NA 136 09/05/2021 1041   K 3.8 11/30/2021 1039   CL 106 11/30/2021 1039   CO2 28 11/30/2021 1039   BUN 16 11/30/2021 1039   BUN 15 09/05/2021 1041   CREATININE 0.91 11/30/2021 1039      Component Value Date/Time   CALCIUM 9.0 11/30/2021 1039   ALKPHOS 62 11/30/2021 1039   AST 17 11/30/2021 1039   ALT 18 11/30/2021 1039   BILITOT 0.6 11/30/2021 1039       RADIOGRAPHIC STUDIES: No results found.   ASSESSMENT AND PLAN: This is a very pleasant 81 years old African-American male recently diagnosed with a small lymphocytic lymphoma with generalized lymphadenopathy in the neck, chest, axilla, abdomen and pelvis. The core biopsy of the left inguinal lymph node was consistent with the above diagnosis. The patient  was seen recently with worsening anemia and thrombocytopenia.  He has imaging studies that showed mildly progressive lymphadenopathy in the right axilla, porta hepatis, retroperitoneum and pelvis but not the mediastinal lymphadenopathy. The patient was treated with a tapered dose of prednisone with no improvement in his anemia and thrombocytopenia. The patient definitely has indication now for treatment of his condition especially with the persistent refractory anemia and thrombocytopenia. The patient is currently on treatment with obinutuzumab and venetoclax.  He is status post 3 doses of obinutuzumab and he is currently on venetoclax 200 mg daily.  I recommended for him to increase his dose to 400 mg p.o. daily starting today. He will proceed with cycle #4 of obinutuzumab today as planned. I will see him back for follow-up visit in 4 weeks for evaluation before the next cycle of his treatment. He was advised to call immediately if he has any other concerning symptoms in the  interval. The patient voices understanding of current disease status and treatment options and is in agreement with the current care plan.  All questions were answered. The patient knows to call the clinic with any problems, questions or concerns. We can certainly see the patient much sooner if necessary.  The total time spent in the appointment was 20 minutes.  Disclaimer: This note was dictated with voice recognition software. Similar sounding words can inadvertently be transcribed and may not be corrected upon review.

## 2021-12-02 ENCOUNTER — Encounter: Payer: Self-pay | Admitting: Physician Assistant

## 2021-12-02 NOTE — Progress Notes (Unsigned)
Cardiology Office Note    Date:  12/05/2021   ID:  Joseph Moyer, DOB [birth year], MRN [id number]  PCP:  [name], MD  Cardiologist:  Freada Bergeron, MD  Electrophysiologist:  None   Chief Complaint: f/u PVCs  History of Present Illness:   Joseph Moyer is a 81 y.o. 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    He was originally seen here for shortness of breath with coronary CTA in 01/2020 showing mild-moderate nonobstructive CAD with negative FFR. The study also incidentally picked up extensive lymphadenopathy and he was subsequently found to have small lymphocytic lymphoma. 2D echo 12/2019 showed EF 65-70%, no significant valvular disease or diastolic dysfunction. 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    He was seen for routine follow-up 08/2021 reporting continued chronic exertional dyspnea. He was noted to be mildly tachycardic (sinus) with frequent PVCs by EKG. He had ongoing issues with anemia with prior baseline 11-13, with Hgb 7s at time of visit. He did report some BRBPR and was advised to f/u with his GI team. He had had a colonoscopy in 2017 with polyp removed. He was otherwise well appearing. Per discussion with Dr. Johney Frame, given issues with anemia, thrombocytopenia, BRBPR and nonobstructive disease, we discontinued his ASA. Echo showed EF 55-60%, G1DD, normal strain, normal RV, normal IVC, no significant valve disease. Event monitor showed min HR 62, max 138bpm, average HR 102bpm, rare PACs, frequent PVCs (11.9%). He was started on low dose metoprolol. BRBPR had resolved off  ASA.  He returned for follow-up doing well since last visit. His DOE has improved significantly as his anemia has improved. He does recall one day last week having brief episode of chest pain lasting seconds without major associated symptoms. He works part time at [employer] and otherwise has not had any recent exertional angina. Denies palpitations.   Labwork independently reviewed: 11/2021 K 3.8, Cr 0.91, Hgb 11.2, plt 241 09/2021 Mg 1.5 08/2021 Mg 2.1, WBC 1.5, Hgb 8.0 (nadir 6.9), plt 115, Na 134, K 4.2, Cr 0.93, LFTS ok, TSH wnl, LDL 121 03/2020 LDL 64   Cardiology Studies:   Studies reviewed are outlined and summarized above. Reports included below if pertinent.   2D echo 09/20/21  1. Left ventricular ejection fraction, by estimation, is 55 to 60%. Left  ventricular ejection fraction by 3D volume is 55 %. The left ventricle has  normal function. The left ventricle has no regional wall motion  abnormalities. Left ventricular diastolic   parameters are consistent with Grade I diastolic dysfunction (impaired  relaxation). The average left ventricular global longitudinal strain is  -21.0 %. The global longitudinal strain is normal.   2. Right ventricular systolic function is normal. The right ventricular  size is normal.   3. The mitral valve is normal in structure. No evidence of mitral valve  regurgitation. No evidence of mitral stenosis.   4. The aortic valve is tricuspid. Aortic valve regurgitation is not  visualized. No aortic stenosis is present. Aortic valve area, by VTI  measures 1.82 cm. Aortic valve mean gradient measures 10.6 mmHg. Aortic  valve Vmax measures 2.22 m/s.   5. The inferior vena cava  is normal in size with greater than 50%  respiratory variability, suggesting right atrial pressure of 3 mmHg.    Cor CT 01/2020 IMPRESSION: 1. Coronary calcium score of 257. This was 67th percentile for age and sex matched control.   2.  Normal coronary origin  with right dominance.   3. Moderate plaque in the mid LAD, mild plaque in the proximal LAD and D1. CAD RADS 3.   4.  Mild calcification of the aorta.   5.  Mild calcification of the aortic valve.   6. Will send study for FFRct to assess for obstructive disease in the LAD.   Skeet Latch, MD     Electronically Signed   By: Skeet Latch   On: 02/10/2020 19:12   FINDINGS: FFRct analysis was performed on the original cardiac CT angiogram dataset. Diagrammatic representation of the FFRct analysis is provided in a separate PDF document in PACS. This dictation was created using the PDF document and an interactive 3D model of the results. 3D model is not available in the EMR/PACS. Normal FFR range is >0.80.   1. Left Main: FFRct 0.98   2. LAD: FFRct 0.97 proximal, 0.91 mid, 0.82 distal.  D1 FFRct 0.95.   3. LCX: FFRct 0.98 proximal, 0.98 mid.  OM1 FFRct 0.98.   4. RCA: FFRct 0.98 proximal, 0.94 mid, 0.85 distal.   IMPRESSION: 1. FFRct findings consistent with non-obstructive coronary artery disease.   2.  Recommend aggressive risk factor modification.   Tiffany C. Oval Linsey, MD    Electronically Signed   By: Skeet Latch   On: 02/11/2020 16:45    IMPRESSION: 1. Evidence for mediastinal and hilar lymphadenopathy. Findings are nonspecific and mediastinum is incompletely imaged. Recommend further evaluation with a chest CT with IV contrast. 2. Small hiatal hernia. 3.  Aortic Atherosclerosis (ICD10-I70.0).   These results will be called to the ordering clinician or representative by the Radiologist Assistant, and communication documented in the PACS or [employer].   Electronically Signed: By: Markus Daft M.D. On: 02/10/2020 16:30   2d Echo 12/2019  1. Left ventricular ejection fraction, by estimation, is 65 to 70%. The  left ventricle has normal function. The left ventricle has no regional  wall motion abnormalities. Left ventricular diastolic  parameters were  normal.   2. Right ventricular systolic function is normal. The right ventricular  size is normal. Tricuspid regurgitation signal is inadequate for assessing  PA pressure.   3. The mitral valve is grossly normal. No evidence of mitral valve  regurgitation. No evidence of mitral stenosis.   4. The aortic valve is tricuspid. There is mild calcification of the  aortic valve. Aortic valve regurgitation is not visualized. No aortic  stenosis is present.   5. The inferior vena cava is normal in size with greater than 50%  respiratory variability, suggesting right atrial pressure of 3 mmHg.   Conclusion(s)/Recommendation(s): Normal biventricular function without  evidence of hemodynamically significant valvular heart disease.     Past Medical History:  Diagnosis Date   Allergy    Arthritis    CAD in native artery    COPD (chronic obstructive pulmonary disease) (HCC)    Frequent PVCs    GERD (gastroesophageal reflux disease)    Hyperlipidemia    Hypertension    Mild carotid artery disease (HCC)    Small lymphocytic lymphoma ([place])     Past Surgical History:  Procedure Laterality Date   BRONCHIAL NEEDLE ASPIRATION BIOPSY  03/12/2020   Procedure:  BRONCHIAL NEEDLE ASPIRATION BIOPSIES;  Surgeon: Garner Nash, DO;  Location: [facility];  Service: Pulmonary;;   CIRCUMCISION     COLONOSCOPY WITH PROPOFOL N/A 05/10/2015   Procedure: COLONOSCOPY WITH PROPOFOL;  Surgeon: Garlan Fair, MD;  Location: [facility];  Service: Endoscopy;  Laterality: N/A;   DECOMPRESSIVE LUMBAR LAMINECTOMY LEVEL 1 N/A 06/15/2018   Procedure: Gordy Levan decompression L1-L2/ complete inferior facetectomyof L1. Posterior lateral arthrodesiswith autograft bone L1-L2.;  Surgeon: Melina Schools, MD;  Location: [facility];  Service: Orthopedics;  Laterality: N/A;   HEMORRHOID SURGERY     IR IMAGING GUIDED PORT INSERTION  09/29/2021   VIDEO BRONCHOSCOPY WITH ENDOBRONCHIAL ULTRASOUND N/A 03/12/2020   Procedure:  VIDEO BRONCHOSCOPY WITH ENDOBRONCHIAL ULTRASOUND;  Surgeon: Garner Nash, DO;  Location: [facility];  Service: Pulmonary;  Laterality: N/A;    Current Medications: Current Meds  Medication Sig   allopurinol (ZYLOPRIM) 300 MG tablet Take 1 tablet (300 mg total) by mouth 2 (two) times daily.   amLODipine (NORVASC) 5 MG tablet Take 5 mg by mouth daily.   atorvastatin (LIPITOR) 40 MG tablet Take 1 tablet (40 mg total) by mouth daily.   diphenhydrAMINE HCl, Sleep, 50 MG CAPS Take 50 mg by mouth at bedtime.   FeFum-FePoly-FA-B Cmp-C-Biot (INTEGRA PLUS) CAPS Take 1 capsule by mouth daily.   lidocaine-prilocaine (EMLA) cream Apply 1 Application topically as needed. 1-2 hours prior to treatment, apply a tsp of the numbing cream to the skin over the port site and cover with cling wrap or other wrap. Do not rub in the cream. Do not use on the area until the port site is healed .   losartan (COZAAR) 25 MG tablet Take 1 tablet (25 mg total) by mouth daily.   meloxicam (MOBIC) 15 MG tablet Take 15 mg by mouth daily as needed.   methocarbamol (ROBAXIN) 750 MG tablet Take 750 mg by mouth every 6 (six) hours as needed.   metoprolol succinate (TOPROL-XL) 25 MG 24 hr tablet TAKE 1 TABLET (25 MG TOTAL) BY MOUTH DAILY.   Naphazoline-Glycerin (CLEAR EYES REDNESS RELIEF OP) Place 1 drop into both eyes as needed.   omeprazole (PRILOSEC) 20 MG capsule Take 1 capsule (20 mg total) by mouth daily.   tamsulosin (FLOMAX) 0.4 MG CAPS capsule Take 0.4 mg by mouth daily.   umeclidinium bromide (INCRUSE ELLIPTA) 62.5 MCG/INH AEPB Inhale 1 puff into the lungs daily.   venetoclax (VENCLEXTA) 100 MG tablet Take 4 tablets (400 mg total) by mouth daily. Tablets should be swallowed whole with a meal and a full glass of water.      Allergies:   Obinutuzumab, Oxycodone, Ramipril, and Rosuvastatin   Social History   Socioeconomic History   Marital status: Married    Spouse name: Not on file   Number of children: Not on  file   Years of education: Not on file   Highest education level: Not on file  Occupational History   Not on file  Tobacco Use   Smoking status: Former    Types: Cigarettes    Quit date: 34    Years since quitting: 29.8   Smokeless tobacco: Never  Vaping Use   Vaping Use: Never used  Substance and Sexual Activity   Alcohol use: Yes    Alcohol/week: 0.0 standard drinks of alcohol    Comment: occassionally   Drug use: No   Sexual activity: Not on file  Other Topics Concern   Not on file  Social History Narrative  Not on file   Social Determinants of Health   Financial Resource Strain: Not on file  Food Insecurity: No Food Insecurity (11/01/2021)   Hunger Vital Sign    Worried About Running Out of Food in the Last Year: Never true    Ran Out of Food in the Last Year: Never true  Transportation Needs: No Transportation Needs (11/01/2021)   PRAPARE - Hydrologist (Medical): No    Lack of Transportation (Non-Medical): No  Physical Activity: Not on file  Stress: Not on file  Social Connections: Socially Integrated (06/17/2018)   Social Connection and Isolation Panel [NHANES]    Frequency of Communication with Friends and Family: Three times a week    Frequency of Social Gatherings with Friends and Family: Three times a week    Attends Religious Services: 1 to 4 times per year    Active Member of Clubs or Organizations: Yes    Attends Archivist Meetings: Never    Marital Status: Married     Family History:  The patient's family history includes Diabetes in his brother, mother, and sister.  ROS:   Please see the history of present illness.  All other systems are reviewed and otherwise negative.    EKG(s)/Additional Labs   EKG:  EKG is ordered today, personally reviewed, demonstrating NSR 79bpm, nonspecific STW changes similar to prior.  Recent Labs: 09/05/2021: TSH 1.760 10/05/2021: Magnesium 1.5 11/30/2021: ALT 18; BUN 16;  Creatinine 0.91; Hemoglobin 11.2; Platelet Count 241; Potassium 3.8; Sodium 139  Recent Lipid Panel    Component Value Date/Time   CHOL 192 09/05/2021 1044   TRIG 90 09/05/2021 1044   HDL 55 09/05/2021 1044   CHOLHDL 3.5 09/05/2021 1044   LDLCALC 121 (H) 09/05/2021 1044    PHYSICAL EXAM:    VS:  BP 120/72   Pulse 84   Ht '5\' 11"'$  (1.803 m)   Wt 222 lb (100.7 kg)   SpO2 99%   BMI 30.96 kg/m   BMI: Body mass index is 30.96 kg/m.  GEN: Well nourished, well developed male in no acute distress HEENT: normocephalic, atraumatic Neck: no JVD, carotid bruits, or masses Cardiac: RRR; soft SEM, no murmurs, rubs, or gallops, no edema  Respiratory:  clear to auscultation bilaterally, normal work of breathing GI: soft, nontender, nondistended, + BS MS: no deformity or atrophy Skin: warm and dry, no rash Neuro:  Alert and Oriented x 3, Strength and sensation are intact, follows commands Psych: euthymic mood, full affect  Wt Readings from Last 3 Encounters:  12/05/21 222 lb (100.7 kg)  11/30/21 220 lb 4.8 oz (99.9 kg)  11/02/21 219 lb 9 oz (99.6 kg)     ASSESSMENT & PLAN:   1. Exertional dyspnea, atypical chest pain, h/o nonobstructive CAD, HLD - exertional dyspnea has resolved with improvement in hemoglobin. He is no longer on baby ASA for reasons above. Can revisit restarting at next OV if hemoglobin and platelet counts remain stable. He had brief episode of CP that sounds noncardiac. EKG reassuring without acute chnages. Continue medical therapy. He will notify for any recurrent symptoms. Will get f/u H/H to ensure no acute change from prior. Regarding lipids, last LDL was above goal in 08/2021 but he was not taking his statin at that time. He has since been back on atorvastatin and LFTs were OK by last check. He had a sausage biscuit today a short while ago. We discussed having him return for fasting lipids here  but he wishes to f/u with PCP instead.  3. Sinus tachycardia, frequent PVCs -  quiescent on metoprolol. Last K 3.8, Mg 1.5, Recheck lytes today.  4. Mild carotid artery disease - discussed surveillance carotid duplex and he wishes to defer at this time. Will need to review at next OV.  5. Essential HTN - controlled, no changes made today.    Disposition: F/u with Dr. Johney Frame or myself in 6 months.   Medication Adjustments/Labs and Tests Ordered: Current medicines are reviewed at length with the patient today.  Concerns regarding medicines are outlined above. Medication changes, Labs and Tests ordered today are summarized above and listed in the Patient Instructions accessible in Encounters.   Signed, Charlie Pitter, PA-C  12/05/2021 9:51 AM    [place] Phone: [phone]; Fax: [phone]

## 2021-12-04 ENCOUNTER — Other Ambulatory Visit: Payer: Self-pay | Admitting: Internal Medicine

## 2021-12-05 ENCOUNTER — Encounter: Payer: Self-pay | Admitting: Physician Assistant

## 2021-12-05 ENCOUNTER — Ambulatory Visit: Payer: Medicare HMO | Attending: Physician Assistant | Admitting: Physician Assistant

## 2021-12-05 VITALS — BP 120/72 | HR 84 | Ht 71.0 in | Wt 222.0 lb

## 2021-12-05 DIAGNOSIS — I2510 Atherosclerotic heart disease of native coronary artery without angina pectoris: Secondary | ICD-10-CM

## 2021-12-05 DIAGNOSIS — R0609 Other forms of dyspnea: Secondary | ICD-10-CM | POA: Diagnosis not present

## 2021-12-05 DIAGNOSIS — R0789 Other chest pain: Secondary | ICD-10-CM | POA: Diagnosis not present

## 2021-12-05 DIAGNOSIS — I493 Ventricular premature depolarization: Secondary | ICD-10-CM | POA: Diagnosis not present

## 2021-12-05 DIAGNOSIS — R000 Tachycardia, unspecified: Secondary | ICD-10-CM | POA: Diagnosis not present

## 2021-12-05 DIAGNOSIS — I779 Disorder of arteries and arterioles, unspecified: Secondary | ICD-10-CM

## 2021-12-05 DIAGNOSIS — E785 Hyperlipidemia, unspecified: Secondary | ICD-10-CM | POA: Diagnosis not present

## 2021-12-05 DIAGNOSIS — I10 Essential (primary) hypertension: Secondary | ICD-10-CM

## 2021-12-05 LAB — BASIC METABOLIC PANEL
BUN/Creatinine Ratio: 18 (ref 10–24)
BUN: 16 mg/dL (ref 8–27)
CO2: 28 mmol/L (ref 20–29)
Calcium: 9.2 mg/dL (ref 8.6–10.2)
Chloride: 100 mmol/L (ref 96–106)
Creatinine, Ser: 0.9 mg/dL (ref 0.76–1.27)
Glucose: 100 mg/dL — ABNORMAL HIGH (ref 70–99)
Potassium: 4 mmol/L (ref 3.5–5.2)
Sodium: 139 mmol/L (ref 134–144)
eGFR: 87 mL/min/{1.73_m2} (ref >59)

## 2021-12-05 LAB — HEMOGLOBIN AND HEMATOCRIT, BLOOD
Hematocrit: 35.5 % — ABNORMAL LOW (ref 37.5–51.0)
Hemoglobin: 11.9 g/dL — ABNORMAL LOW (ref 13.0–17.7)

## 2021-12-05 LAB — MAGNESIUM: Magnesium: 1.9 mg/dL (ref 1.6–2.3)

## 2021-12-05 NOTE — Patient Instructions (Addendum)
  Medication Instructions:   Your physician recommends that you continue on your current medications as directed. Please refer to the Current Medication list given to you today.  *If you need a refill on your cardiac medications before your next appointment, please call your pharmacy*   Lab Work:  BMET  [place]   Please talk to your primary care provider about rechecking your cholesterol. If you would like our office to follow this for you, please give [place] a call and we will make arrangements to have you come back in for fasting labs. Your blood sugar was also up by your recent bloodwork and should be followed up.   If you have labs (blood work) drawn today and your tests are completely normal, you will receive your results only by: [place] (if you have MyChart) OR A paper copy in the mail If you have any lab test that is abnormal or we need to change your treatment, we will call you to review the results.   Testing/Procedures: NONE ORDERED  TODAY    Follow-Up: At [facility], you and your health needs are our priority.  As part of our continuing mission to provide you with exceptional heart care, we have created designated Provider Care Teams.  These Care Teams include your primary Cardiologist (physician) and Advanced Practice Providers (APPs -  Physician Assistants and Nurse Practitioners) who all work together to provide you with the care you need, when you need it.  We recommend signing up for the patient portal called "MyChart".  Sign up information is provided on this After Visit Summary.  MyChart is used to connect with patients for Virtual Visits (Telemedicine).  Patients are able to view lab/test results, encounter notes, upcoming appointments, etc.  Non-urgent messages can be sent to your provider as well.   To learn more about what you can do with MyChart, go to [email or web address].    Your next appointment:   6  month(s)  The format for your next appointment:   In Person  Provider:    Freada Bergeron, MD or  Melina Copa, PA-C will plan to see you again in 6 month(s).    Other Instructions   Important Information About Sugar

## 2021-12-07 ENCOUNTER — Other Ambulatory Visit: Payer: Self-pay

## 2021-12-07 ENCOUNTER — Inpatient Hospital Stay: Payer: Medicare HMO

## 2021-12-07 ENCOUNTER — Other Ambulatory Visit: Payer: Medicare HMO

## 2021-12-07 DIAGNOSIS — Z5112 Encounter for antineoplastic immunotherapy: Secondary | ICD-10-CM | POA: Diagnosis not present

## 2021-12-07 DIAGNOSIS — D649 Anemia, unspecified: Secondary | ICD-10-CM | POA: Diagnosis not present

## 2021-12-07 DIAGNOSIS — C8300 Small cell B-cell lymphoma, unspecified site: Secondary | ICD-10-CM

## 2021-12-07 DIAGNOSIS — Z95828 Presence of other vascular implants and grafts: Secondary | ICD-10-CM

## 2021-12-07 DIAGNOSIS — Z79899 Other long term (current) drug therapy: Secondary | ICD-10-CM | POA: Diagnosis not present

## 2021-12-07 DIAGNOSIS — T451X5A Adverse effect of antineoplastic and immunosuppressive drugs, initial encounter: Secondary | ICD-10-CM

## 2021-12-07 DIAGNOSIS — C8308 Small cell B-cell lymphoma, lymph nodes of multiple sites: Secondary | ICD-10-CM | POA: Diagnosis not present

## 2021-12-07 LAB — CBC WITH DIFFERENTIAL (CANCER CENTER ONLY)
Abs Immature Granulocytes: 0.02 10*3/uL (ref 0.00–0.07)
Basophils Absolute: 0 10*3/uL (ref 0.0–0.1)
Basophils Relative: 1 %
Eosinophils Absolute: 0.1 10*3/uL (ref 0.0–0.5)
Eosinophils Relative: 2 %
HCT: 34 % — ABNORMAL LOW (ref 39.0–52.0)
Hemoglobin: 11.3 g/dL — ABNORMAL LOW (ref 13.0–17.0)
Immature Granulocytes: 0 %
Lymphocytes Relative: 33 %
Lymphs Abs: 2 10*3/uL (ref 0.7–4.0)
MCH: 29.5 pg (ref 26.0–34.0)
MCHC: 33.2 g/dL (ref 30.0–36.0)
MCV: 88.8 fL (ref 80.0–100.0)
Monocytes Absolute: 0.5 10*3/uL (ref 0.1–1.0)
Monocytes Relative: 9 %
Neutro Abs: 3.2 10*3/uL (ref 1.7–7.7)
Neutrophils Relative %: 55 %
Platelet Count: 261 10*3/uL (ref 150–400)
RBC: 3.83 MIL/uL — ABNORMAL LOW (ref 4.22–5.81)
RDW: 16.3 % — ABNORMAL HIGH (ref 11.5–15.5)
WBC Count: 5.9 10*3/uL (ref 4.0–10.5)
nRBC: 0 % (ref 0.0–0.2)

## 2021-12-07 LAB — CMP (CANCER CENTER ONLY)
ALT: 16 U/L (ref 0–44)
AST: 14 U/L — ABNORMAL LOW (ref 15–41)
Albumin: 4 g/dL (ref 3.5–5.0)
Alkaline Phosphatase: 58 U/L (ref 38–126)
Anion gap: 5 (ref 5–15)
BUN: 14 mg/dL (ref 8–23)
CO2: 29 mmol/L (ref 22–32)
Calcium: 9.1 mg/dL (ref 8.9–10.3)
Chloride: 105 mmol/L (ref 98–111)
Creatinine: 0.94 mg/dL (ref 0.61–1.24)
GFR, Estimated: >60 mL/min (ref >60)
Glucose, Bld: 150 mg/dL — ABNORMAL HIGH (ref 70–99)
Potassium: 4 mmol/L (ref 3.5–5.1)
Sodium: 139 mmol/L (ref 135–145)
Total Bilirubin: 0.6 mg/dL (ref 0.3–1.2)
Total Protein: 6.9 g/dL (ref 6.5–8.1)

## 2021-12-07 MED ORDER — HEPARIN SOD (PORK) LOCK FLUSH 100 UNIT/ML IV SOLN
500.0000 [IU] | Freq: Once | INTRAVENOUS | Status: AC
  Administered 2021-12-07: 500 [IU]

## 2021-12-07 MED ORDER — SODIUM CHLORIDE 0.9% FLUSH
10.0000 mL | Freq: Once | INTRAVENOUS | Status: AC
  Administered 2021-12-07: 10 mL

## 2021-12-18 IMAGING — US US CAROTID DUPLEX BILAT
1 series · 13 of 24 positions shown · non-contrast
Comparison: None.

CLINICAL DATA: Hypertension, hyperlipidemia

EXAM:
BILATERAL CAROTID DUPLEX ULTRASOUND
TECHNIQUE: Gray scale imaging, color Doppler and duplex ultrasound were
performed of bilateral carotid and vertebral arteries in the neck.

[Series 1: us carotid duplex bilat · 0.06mm/px · 13 of 84 slices shown]
[im 1/84]
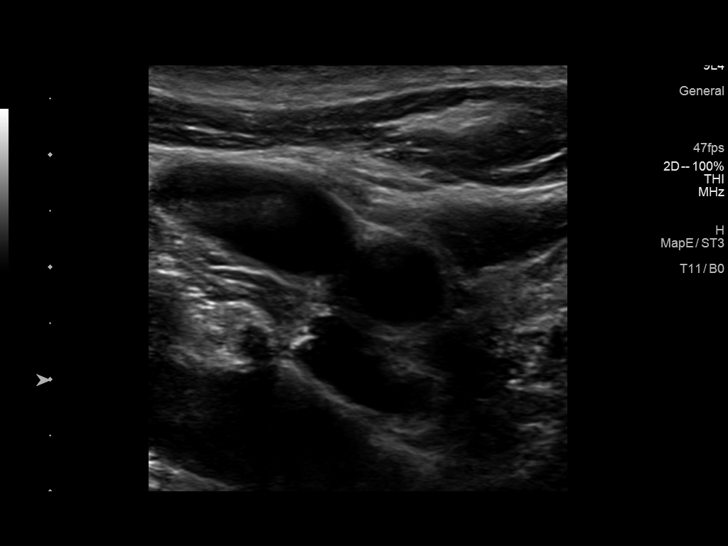
[im 8/84]
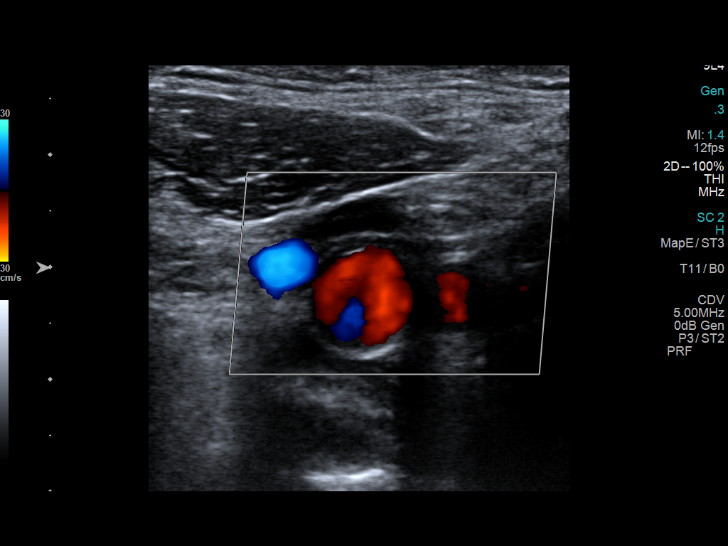
[im 15/84]
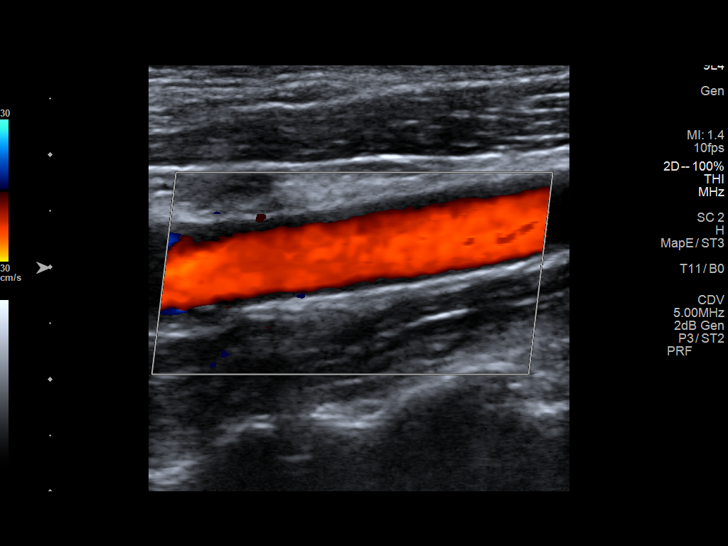
[im 22/84]
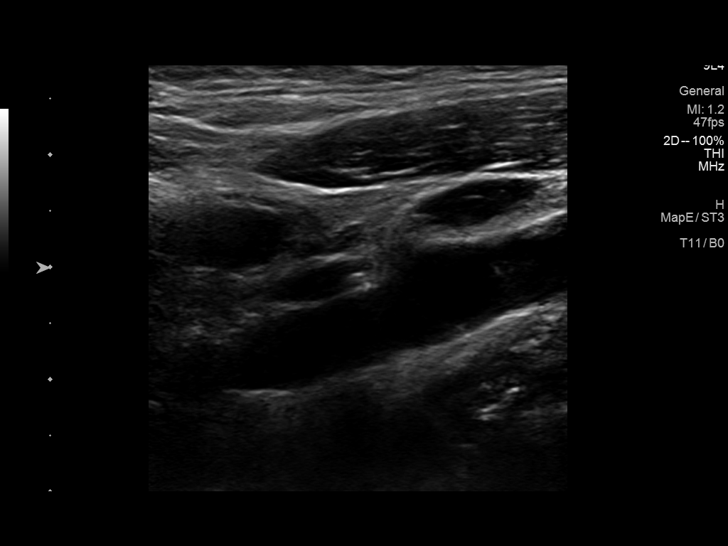
[im 29/84]
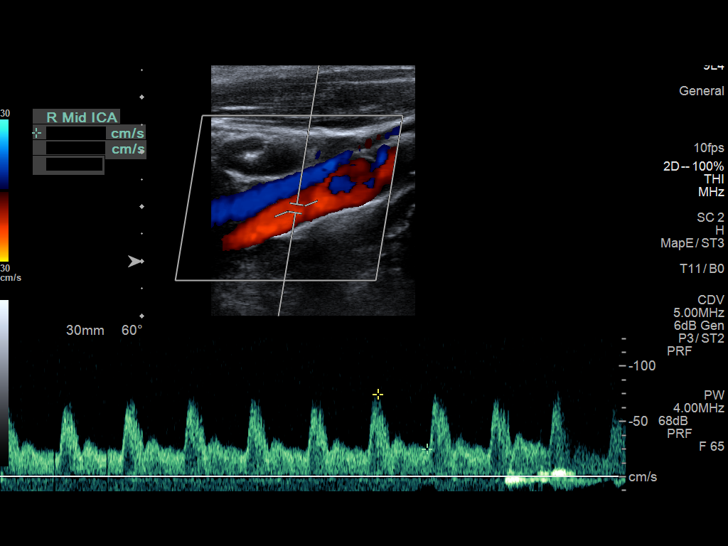
[im 37/84]
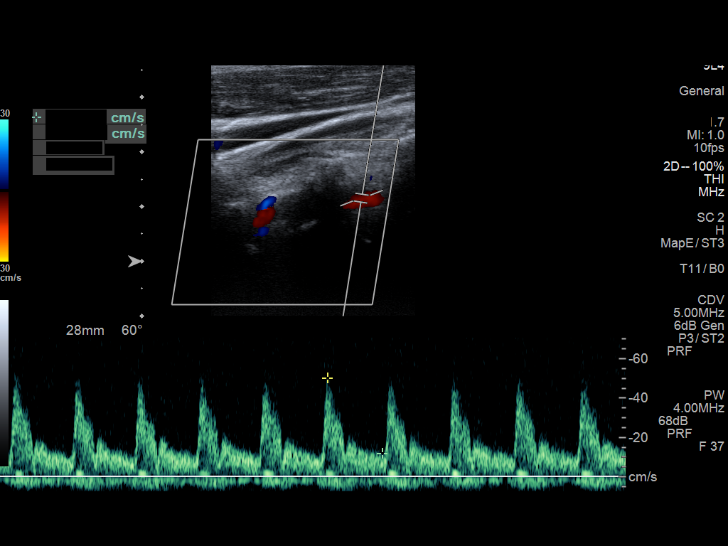
[im 44/84]
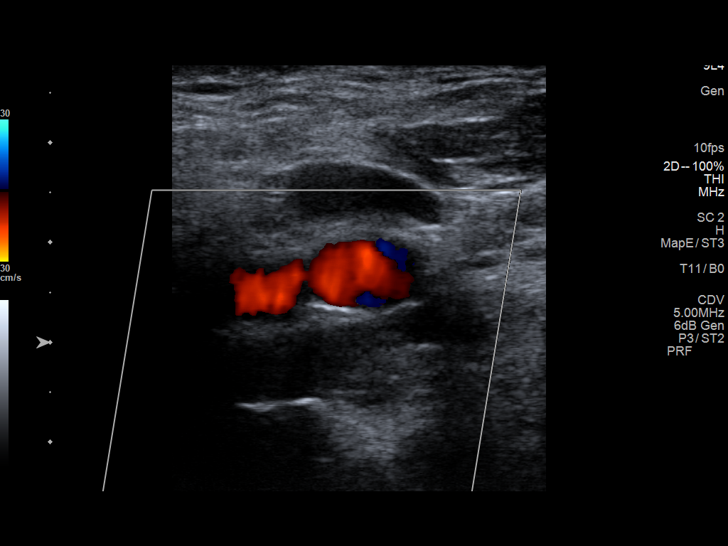
[im 47/84]
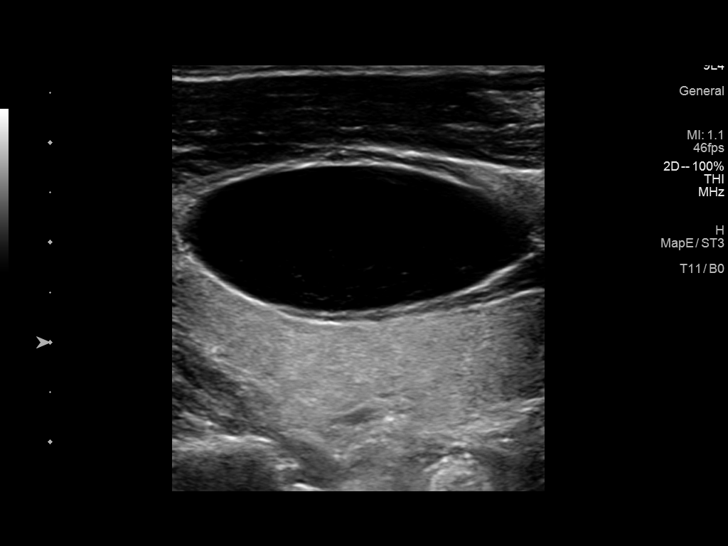
[im 55/84]
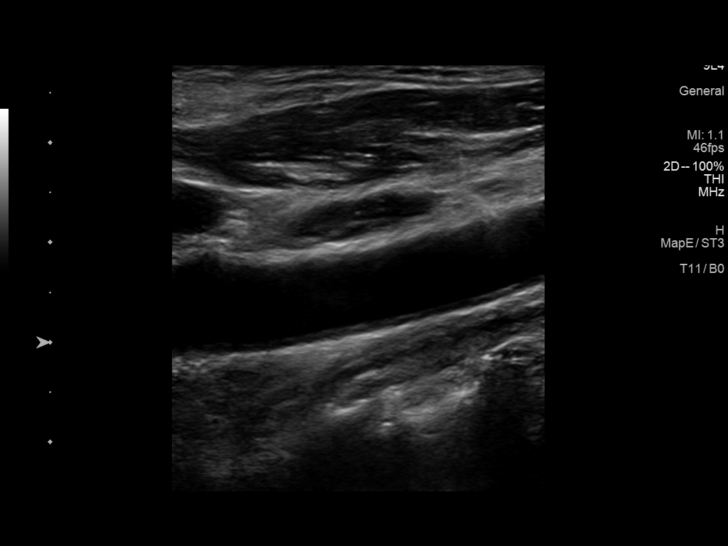
[im 62/84]
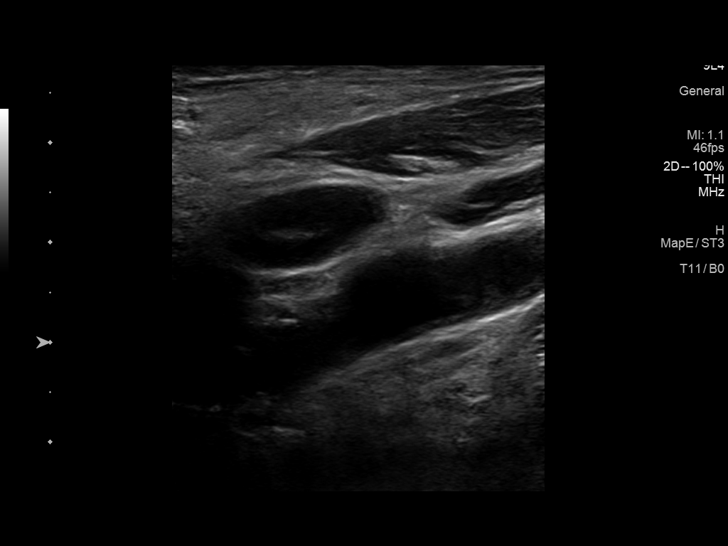
[im 69/84]
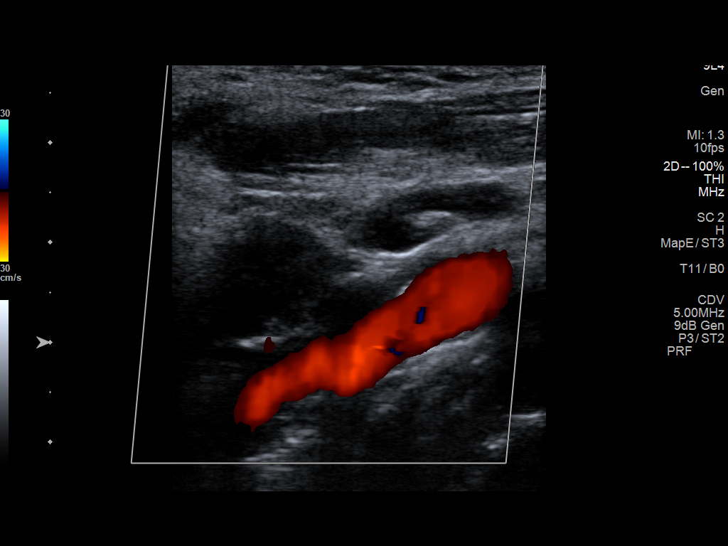
[im 76/84]
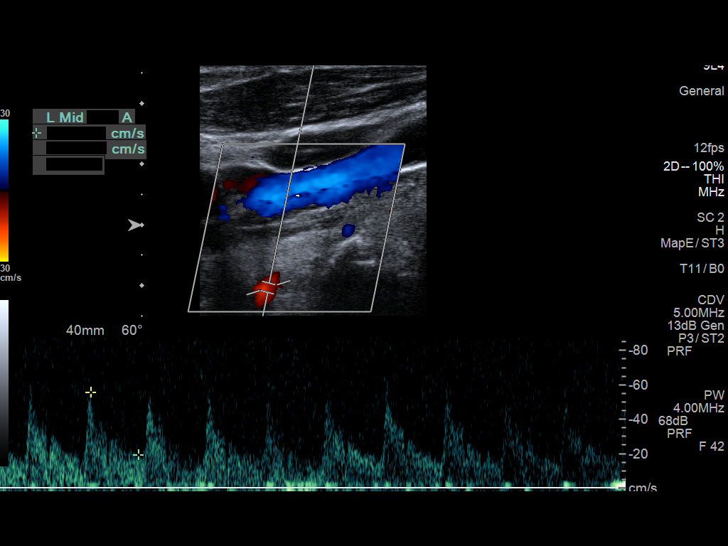
[im 84/84]
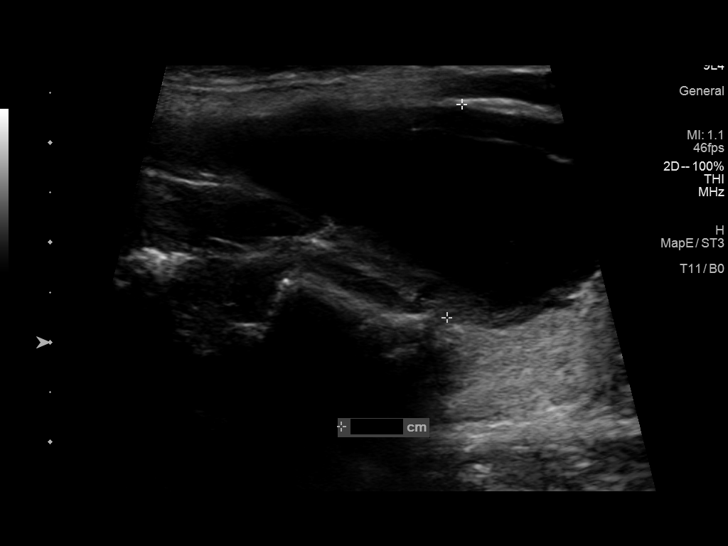

[13 of 24 positions shown; findings below may reference images not displayed]

FINDINGS: Criteria: Quantification of carotid stenosis is based on velocity
parameters that correlate the residual internal carotid diameter
with NASCET-based stenosis levels, using the diameter of the distal
internal carotid lumen as the denominator for stenosis measurement.

The following velocity measurements were obtained:

RIGHT

ICA: 96/22 cm/sec

CCA: 142/18 cm/sec

SYSTOLIC ICA/CCA RATIO:

ECA: 90 cm/sec

LEFT

ICA: 88/15 cm/sec

CCA: 124/25 cm/sec

SYSTOLIC ICA/CCA RATIO:

ECA: 125 cm/sec

RIGHT CAROTID ARTERY: Minor echogenic shadowing plaque formation. No
hemodynamically significant right ICA stenosis, velocity elevation,
or turbulent flow. Degree of narrowing less than 50%.

RIGHT VERTEBRAL ARTERY:  Antegrade

LEFT CAROTID ARTERY: Similar scattered minor echogenic plaque
formation. No hemodynamically significant left ICA stenosis,
velocity elevation, or turbulent flow.

LEFT VERTEBRAL ARTERY:  Antegrade

Upper extremity blood pressures: RIGHT: 117/61 LEFT: 128/75

Mildly enlarged bilateral cervical lymph nodes noted with slightly
thickened hypoechoic cortex and similar elongated. The still have
short axis measurements of 1 cm or less. Lymph nodes remain
nonspecific but suspect infectious/inflammatory.

Left paramidline minimally complex soft tissue cystic structure
again noted measuring 2.4 cm in AP diameter previously described in
0110 and favored to represent a congenital cyst separate from the
thyroid. Most likely benign. No associated vascularity.
IMPRESSION: Minor carotid atherosclerosis. No hemodynamically significant ICA
stenosis. Degree of narrowing less than 50% bilaterally by
ultrasound criteria.

Patent antegrade vertebral flow bilaterally

Mildly enlarged cervical lymph nodes bilaterally, favored to be
reactive. Suspect chronic.

Left paramidline neck soft tissue minimally complex cystic structure
remains indeterminate, measuring 2.4 cm in AP diameter, remotely
described in 0110 on a thyroid ultrasound report. This is favored to
be benign.

## 2021-12-21 ENCOUNTER — Other Ambulatory Visit (HOSPITAL_COMMUNITY): Payer: Self-pay

## 2021-12-23 ENCOUNTER — Other Ambulatory Visit (HOSPITAL_COMMUNITY): Payer: Self-pay

## 2021-12-26 NOTE — Progress Notes (Unsigned)
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   PRIOR THERAPY:   A tapered dose of prednisone but his anemia and thrombocytopenia were refractory to the steroids.   CURRENT THERAPY: Systemic treatment with obinutuzumab every 4 weeks for 6 months.  First dose starting 09/07/21.  In addition the patient will be treatment with venetoclax initially with a starting dose to be increased gradually to the standard dose of 400 mg p.o. daily for a total of 1 year. Status post 4 cycles of treatment.   INTERVAL HISTORY: Joseph Moyer 81 y.o. male returns to the clinic today for a follow-up visit.  The patient was found to have progressive small lymphocytic lymphoma a few months ago started treatment with obinutuzumab and venetoclax. At his last appointment, Dr. Julien Nordmann recommended that he increase his dose to 400 mg. He has been taking this as prescribed and tolerating it well. The patient denies any abnormal bleeding or bruising. His energy is pretty good. His shortness of breath is good. He denies any fever, chills, or night sweats.  Denies any lymphadenopathy.  Denies any nausea, vomiting, diarrhea, constipation, abdominal pain, abdominal fullness, early satiety.   Denies any signs or symptoms of infection including sore throat, skin infections, or dysuria. He sometimes has fleeting achy left chest/rib discomfort that is random and not exertional. He states it will last 30 seconds or so. He denies associated symptoms such as diaphoresis, lightheadedness, shortness of breath, numbness/tingling. He is not having any discomfort at this time. He is here today for evaluation and repeat blood work before undergoing cycle #5.    MEDICAL HISTORY: Past Medical History:   Diagnosis Date   Allergy    Arthritis    CAD in native artery    COPD (chronic obstructive pulmonary disease) (HCC)    Frequent PVCs    GERD (gastroesophageal reflux disease)    Hyperlipidemia    Hypertension    Mild carotid artery disease (HCC)    Small lymphocytic lymphoma (HCC)     ALLERGIES:  is allergic to obinutuzumab, oxycodone, ramipril, and rosuvastatin.  MEDICATIONS:  Current Outpatient Medications  Medication Sig Dispense Refill   allopurinol (ZYLOPRIM) 300 MG tablet Take 1 tablet (300 mg total) by mouth 2 (two) times daily. 60 tablet 0   amLODipine (NORVASC) 5 MG tablet Take 5 mg by mouth daily.     atorvastatin (LIPITOR) 40 MG tablet Take 1 tablet (40 mg total) by mouth daily. 90 tablet 1   diphenhydrAMINE HCl, Sleep, 50 MG CAPS Take 50 mg by mouth at bedtime.     FeFum-FePoly-FA-B Cmp-C-Biot (INTEGRA PLUS) CAPS Take 1 capsule by mouth daily. 30 capsule 3   lidocaine-prilocaine (EMLA) cream Apply 1 Application topically as needed. 1-2 hours prior to treatment, apply a tsp of the numbing cream to the skin over the port site and cover with cling wrap or other wrap. Do not rub in the cream. Do not use on the area until the port site is healed . 30 g 0   losartan (COZAAR) 25 MG tablet Take 1 tablet (25 mg total) by mouth daily. 90 tablet 1   meloxicam (MOBIC) 15 MG tablet Take 15 mg by mouth daily as needed.     methocarbamol (ROBAXIN) 750 MG tablet Take 750 mg by mouth  every 6 (six) hours as needed.     metoprolol succinate (TOPROL-XL) 25 MG 24 hr tablet TAKE 1 TABLET (25 MG TOTAL) BY MOUTH DAILY. 90 tablet 3   Naphazoline-Glycerin (CLEAR EYES REDNESS RELIEF OP) Place 1 drop into both eyes as needed.     omeprazole (PRILOSEC) 20 MG capsule Take 1 capsule (20 mg total) by mouth daily. 90 capsule 0   prochlorperazine (COMPAZINE) 10 MG tablet TAKE 1 TABLET BY MOUTH EVERY 6 HOURS AS NEEDED FOR NAUSEA OR VOMITING. 30 tablet 0   tamsulosin (FLOMAX) 0.4 MG CAPS capsule Take 0.4 mg  by mouth daily.     umeclidinium bromide (INCRUSE ELLIPTA) 62.5 MCG/INH AEPB Inhale 1 puff into the lungs daily. 60 each 5   venetoclax (VENCLEXTA) 100 MG tablet Take 4 tablets (400 mg total) by mouth daily. Tablets should be swallowed whole with a meal and a full glass of water. 120 tablet 3   No current facility-administered medications for this visit.    SURGICAL HISTORY:  Past Surgical History:  Procedure Laterality Date   BRONCHIAL NEEDLE ASPIRATION BIOPSY  03/12/2020   Procedure: BRONCHIAL NEEDLE ASPIRATION BIOPSIES;  Surgeon: Garner Nash, DO;  Location: [facility];  Service: Pulmonary;;   CIRCUMCISION     COLONOSCOPY WITH PROPOFOL N/A 05/10/2015   Procedure: COLONOSCOPY WITH PROPOFOL;  Surgeon: Garlan Fair, MD;  Location: [facility];  Service: Endoscopy;  Laterality: N/A;   DECOMPRESSIVE LUMBAR LAMINECTOMY LEVEL 1 N/A 06/15/2018   Procedure: Gordy Levan decompression L1-L2/ complete inferior facetectomyof L1. Posterior lateral arthrodesiswith autograft bone L1-L2.;  Surgeon: Melina Schools, MD;  Location: [facility];  Service: Orthopedics;  Laterality: N/A;   HEMORRHOID SURGERY     IR IMAGING GUIDED PORT INSERTION  09/29/2021   VIDEO BRONCHOSCOPY WITH ENDOBRONCHIAL ULTRASOUND N/A 03/12/2020   Procedure: VIDEO BRONCHOSCOPY WITH ENDOBRONCHIAL ULTRASOUND;  Surgeon: Garner Nash, DO;  Location: [facility];  Service: Pulmonary;  Laterality: N/A;    REVIEW OF SYSTEMS:   Review of Systems  Constitutional: Negative for appetite change, chills, fatigue, fever and unexpected weight change.  HENT:  Negative for mouth sores, nosebleeds, sore throat and trouble swallowing.   Eyes: Negative for eye problems and icterus.  Respiratory: Negative for cough, hemoptysis, shortness of breath and wheezing.   Cardiovascular: Negative for chest pain and leg swelling.  Gastrointestinal: Negative for abdominal pain, constipation, diarrhea, nausea and vomiting.  Genitourinary: Negative for bladder  incontinence, difficulty urinating, dysuria, frequency and hematuria.   Musculoskeletal: Negative for back pain, gait problem, neck pain and neck stiffness.  Skin: Negative for itching and rash.  Neurological: Negative for dizziness, extremity weakness, gait problem, headaches, light-headedness and seizures.  Hematological: Negative for adenopathy. Does not bruise/bleed easily.  Psychiatric/Behavioral: Negative for confusion, depression and sleep disturbance. The patient is not nervous/anxious.     PHYSICAL EXAMINATION:  Blood pressure 133/74, pulse 77, temperature 98.2 F (36.8 C), temperature source Oral, resp. rate 16, weight 224 lb (101.6 kg), SpO2 100 %.  ECOG PERFORMANCE STATUS: 1  Physical Exam  Constitutional: Oriented to person, place, and time and well-developed, well-nourished, and in no distress. HENT:  Head: Normocephalic and atraumatic.  Mouth/Throat: Oropharynx is clear and moist. No oropharyngeal exudate.  Eyes: Conjunctivae are normal. Right eye exhibits no discharge. Left eye exhibits no discharge. No scleral icterus.  Neck: Normal range of motion. Neck supple.  Cardiovascular: Normal rate, regular rhythm, normal heart sounds and intact distal pulses.   Pulmonary/Chest: Effort normal and breath sounds normal. No respiratory  distress. No wheezes. No rales.  Abdominal: Soft. Bowel sounds are normal. Exhibits no distension and no mass. There is no tenderness.  Musculoskeletal: Normal range of motion. Exhibits no edema.  Lymphadenopathy:    No cervical adenopathy.  Neurological: Alert and oriented to person, place, and time. Exhibits normal muscle tone. Gait normal. Coordination normal.  Skin: Skin is warm and dry. No rash noted. Not diaphoretic. No erythema. No pallor.  Psychiatric: Mood, memory and judgment normal.  Vitals reviewed.  LABORATORY DATA: Lab Results  Component Value Date   WBC 6.2 12/28/2021   HGB 12.1 (L) 12/28/2021   HCT 36.9 (L) 12/28/2021   MCV  88.9 12/28/2021   PLT 202 12/28/2021      Chemistry      Component Value Date/Time   NA 138 12/28/2021 0805   NA 139 12/05/2021 1007   K 4.1 12/28/2021 0805   CL 106 12/28/2021 0805   CO2 27 12/28/2021 0805   BUN 17 12/28/2021 0805   BUN 16 12/05/2021 1007   CREATININE 1.08 12/28/2021 0805      Component Value Date/Time   CALCIUM 9.5 12/28/2021 0805   ALKPHOS 64 12/28/2021 0805   AST 13 (L) 12/28/2021 0805   ALT 15 12/28/2021 0805   BILITOT 0.6 12/28/2021 0805       RADIOGRAPHIC STUDIES:  No results found.   ASSESSMENT/PLAN:  This is a very pleasant 81 years old African-American male diagnosed with a small lymphocytic lymphoma with generalized lymphadenopathy in the neck, chest, axilla, abdomen and pelvis. The core biopsy of the left inguinal lymph node was consistent with the above diagnosis.  Patient recently developed worsening anemia and thrombocytopenia. His imaging studies showed mildly progressive lymphadenopathy in the right axillary, porta hepatis, retroperitoneum, and pelvis.  The patient received a tapering dose of prednisone without any improvement in his thrombocytopenia.   Dr. Julien Nordmann recommended starting treatment with  obinutuzumab and venetoclax for the duration of 1 year.     The patient is currently on treatment with obinutuzumab and venetoclax.  He is status post 4 cycles of obinutuzumab and he is currently on venetoclax 400 mg daily.   Labs were reviewed. Recommend that he proceed with cycle #5 today as scheduled.   We will see him back in 4 weeks before starting cycle #6.   Recommended he reach out to his cardiologist for evaluation about his fleeting chest pain. He denies associated symptoms. The symptoms are not exertional. May be musculoskeletal in nature. The patient is requesting refill of his muscle relaxer. My nurse called cardiology for refill. Of course, I reviewed s/sx of MI with the patient and he of course was advised to go to ER if he  ever developed chest pain, shortness of breath, nausea, vomiting, lightheadedness, and diaphoresis.   The patient was advised to call immediately if he has any concerning symptoms in the interval. The patient voices understanding of current disease status and treatment options and is in agreement with the current care plan. All questions were answered. The patient knows to call the clinic with any problems, questions or concerns. We can certainly see the patient much sooner if necessary      No orders of the defined types were placed in this encounter.    The total time spent in the appointment was 20-29 minute.   Nena Hampe L Lucette Kratz, PA-C 12/28/21

## 2021-12-27 MED FILL — Dexamethasone Sodium Phosphate Inj 100 MG/10ML: INTRAMUSCULAR | Qty: 2 | Status: AC

## 2021-12-28 ENCOUNTER — Other Ambulatory Visit: Payer: Self-pay

## 2021-12-28 ENCOUNTER — Inpatient Hospital Stay: Payer: Medicare HMO | Admitting: Physician Assistant

## 2021-12-28 ENCOUNTER — Inpatient Hospital Stay: Payer: Medicare HMO

## 2021-12-28 VITALS — BP 133/74 | HR 77 | Temp 98.2°F | Resp 16 | Wt 224.0 lb

## 2021-12-28 VITALS — BP 130/74 | HR 80 | Temp 97.9°F | Resp 18

## 2021-12-28 DIAGNOSIS — C8300 Small cell B-cell lymphoma, unspecified site: Secondary | ICD-10-CM

## 2021-12-28 DIAGNOSIS — Z5111 Encounter for antineoplastic chemotherapy: Secondary | ICD-10-CM | POA: Diagnosis not present

## 2021-12-28 DIAGNOSIS — Z5112 Encounter for antineoplastic immunotherapy: Secondary | ICD-10-CM | POA: Diagnosis not present

## 2021-12-28 DIAGNOSIS — D649 Anemia, unspecified: Secondary | ICD-10-CM | POA: Diagnosis not present

## 2021-12-28 DIAGNOSIS — C8308 Small cell B-cell lymphoma, lymph nodes of multiple sites: Secondary | ICD-10-CM | POA: Diagnosis not present

## 2021-12-28 DIAGNOSIS — Z79899 Other long term (current) drug therapy: Secondary | ICD-10-CM | POA: Diagnosis not present

## 2021-12-28 LAB — CBC WITH DIFFERENTIAL (CANCER CENTER ONLY)
Abs Immature Granulocytes: 0.01 10*3/uL (ref 0.00–0.07)
Basophils Absolute: 0 10*3/uL (ref 0.0–0.1)
Basophils Relative: 1 %
Eosinophils Absolute: 0.1 10*3/uL (ref 0.0–0.5)
Eosinophils Relative: 1 %
HCT: 36.9 % — ABNORMAL LOW (ref 39.0–52.0)
Hemoglobin: 12.1 g/dL — ABNORMAL LOW (ref 13.0–17.0)
Immature Granulocytes: 0 %
Lymphocytes Relative: 43 %
Lymphs Abs: 2.7 10*3/uL (ref 0.7–4.0)
MCH: 29.2 pg (ref 26.0–34.0)
MCHC: 32.8 g/dL (ref 30.0–36.0)
MCV: 88.9 fL (ref 80.0–100.0)
Monocytes Absolute: 0.5 10*3/uL (ref 0.1–1.0)
Monocytes Relative: 9 %
Neutro Abs: 2.9 10*3/uL (ref 1.7–7.7)
Neutrophils Relative %: 46 %
Platelet Count: 202 10*3/uL (ref 150–400)
RBC: 4.15 MIL/uL — ABNORMAL LOW (ref 4.22–5.81)
RDW: 15.4 % (ref 11.5–15.5)
WBC Count: 6.2 10*3/uL (ref 4.0–10.5)
nRBC: 0 % (ref 0.0–0.2)

## 2021-12-28 LAB — CMP (CANCER CENTER ONLY)
ALT: 15 U/L (ref 0–44)
AST: 13 U/L — ABNORMAL LOW (ref 15–41)
Albumin: 4.4 g/dL (ref 3.5–5.0)
Alkaline Phosphatase: 64 U/L (ref 38–126)
Anion gap: 5 (ref 5–15)
BUN: 17 mg/dL (ref 8–23)
CO2: 27 mmol/L (ref 22–32)
Calcium: 9.5 mg/dL (ref 8.9–10.3)
Chloride: 106 mmol/L (ref 98–111)
Creatinine: 1.08 mg/dL (ref 0.61–1.24)
GFR, Estimated: >60 mL/min (ref >60)
Glucose, Bld: 99 mg/dL (ref 70–99)
Potassium: 4.1 mmol/L (ref 3.5–5.1)
Sodium: 138 mmol/L (ref 135–145)
Total Bilirubin: 0.6 mg/dL (ref 0.3–1.2)
Total Protein: 7.4 g/dL (ref 6.5–8.1)

## 2021-12-28 MED ORDER — SODIUM CHLORIDE 0.9 % IV SOLN
1000.0000 mg | Freq: Once | INTRAVENOUS | Status: AC
  Administered 2021-12-28: 1000 mg via INTRAVENOUS
  Filled 2021-12-28: qty 40

## 2021-12-28 MED ORDER — METHYLPREDNISOLONE SODIUM SUCC 125 MG IJ SOLR
60.0000 mg | Freq: Once | INTRAMUSCULAR | Status: AC
  Administered 2021-12-28: 60 mg via INTRAVENOUS
  Filled 2021-12-28: qty 2

## 2021-12-28 MED ORDER — DIPHENHYDRAMINE HCL 50 MG/ML IJ SOLN
50.0000 mg | Freq: Once | INTRAMUSCULAR | Status: AC
  Administered 2021-12-28: 50 mg via INTRAVENOUS
  Filled 2021-12-28: qty 1

## 2021-12-28 MED ORDER — SODIUM CHLORIDE 0.9 % IV SOLN
20.0000 mg | Freq: Once | INTRAVENOUS | Status: AC
  Administered 2021-12-28: 20 mg via INTRAVENOUS
  Filled 2021-12-28: qty 20

## 2021-12-28 MED ORDER — SODIUM CHLORIDE 0.9 % IV SOLN: Freq: Once | INTRAVENOUS | Status: AC

## 2021-12-28 MED ORDER — FAMOTIDINE IN NACL 20-0.9 MG/50ML-% IV SOLN
20.0000 mg | Freq: Once | INTRAVENOUS | Status: AC
  Administered 2021-12-28: 20 mg via INTRAVENOUS
  Filled 2021-12-28: qty 50

## 2021-12-28 MED ORDER — SODIUM CHLORIDE 0.9% FLUSH
10.0000 mL | INTRAVENOUS | Status: DC | PRN
  Administered 2021-12-28: 10 mL

## 2021-12-28 MED ORDER — HEPARIN SOD (PORK) LOCK FLUSH 100 UNIT/ML IV SOLN
500.0000 [IU] | Freq: Once | INTRAVENOUS | Status: AC | PRN
  Administered 2021-12-28: 500 [IU]

## 2021-12-28 MED ORDER — ACETAMINOPHEN 325 MG PO TABS
650.0000 mg | ORAL_TABLET | Freq: Once | ORAL | Status: AC
  Administered 2021-12-28: 650 mg via ORAL
  Filled 2021-12-28: qty 2

## 2021-12-28 NOTE — Progress Notes (Signed)
This patient states that he needs a refill on his Methocarbamol but the pharmacy told him to call the doctors office. Patient states that he is not sure which doctor put him on this medication.  This nurse was able to find out that the Cardiology office started the medication.  This nurse reached out their office and left a message requesting a refill for the patient.  NO further concerns at this time.

## 2021-12-28 NOTE — Patient Instructions (Signed)
[facility]  Discharge Instructions: Thank you for choosing [facility] to provide your oncology and hematology care.   If you have a lab appointment with the [facility], please go directly to the [facility] and check in at the registration area.   Wear comfortable clothing and clothing appropriate for easy access to any Portacath or PICC line.   We strive to give you quality time with your provider. You may need to reschedule your appointment if you arrive late (15 or more minutes).  Arriving late affects you and other patients whose appointments are after yours.  Also, if you miss three or more appointments without notifying the office, you may be dismissed from the clinic at the provider's discretion.      For prescription refill requests, have your pharmacy contact our office and allow 72 hours for refills to be completed.    Today you received the following chemotherapy and/or immunotherapy agents: Gazyva.       To help prevent nausea and vomiting after your treatment, we encourage you to take your nausea medication as directed.  BELOW ARE SYMPTOMS THAT SHOULD BE REPORTED IMMEDIATELY: *FEVER GREATER THAN 100.4 F (38 C) OR HIGHER *CHILLS OR SWEATING *NAUSEA AND VOMITING THAT IS NOT CONTROLLED WITH YOUR NAUSEA MEDICATION *UNUSUAL SHORTNESS OF BREATH *UNUSUAL BRUISING OR BLEEDING *URINARY PROBLEMS (pain or burning when urinating, or frequent urination) *BOWEL PROBLEMS (unusual diarrhea, constipation, pain near the anus) TENDERNESS IN MOUTH AND THROAT WITH OR WITHOUT PRESENCE OF ULCERS (sore throat, sores in mouth, or a toothache) UNUSUAL RASH, SWELLING OR PAIN  UNUSUAL VAGINAL DISCHARGE OR ITCHING   Items with * indicate a potential emergency and should be followed up as soon as possible or go to the Emergency Department if any problems should occur.  Please show the CHEMOTHERAPY ALERT CARD or IMMUNOTHERAPY ALERT CARD at check-in to  the Emergency Department and triage nurse.  Should you have questions after your visit or need to cancel or reschedule your appointment, please contact [facility]  Dept: [phone]  and follow the prompts.  Office hours are 8:00 a.m. to 4:30 p.m. Monday - Friday. Please note that voicemails left after 4:00 p.m. may not be returned until the following business day.  We are closed weekends and major holidays. You have access to a nurse at all times for urgent questions. Please call the main number to the clinic Dept: [phone] and follow the prompts.   For any non-urgent questions, you may also contact your provider using MyChart. We now offer e-Visits for anyone 59 and older to request care online for non-urgent symptoms. For details visit [email or web address].   Also download the MyChart app! Go to the app store, search "MyChart", open the app, select [facility], and log in with your MyChart username and password.  Masks are optional in the cancer centers. If you would like for your care team to wear a mask while they are taking care of you, please let them know. You may have one support person who is at least 79 years old accompany you for your appointments.

## 2021-12-29 ENCOUNTER — Other Ambulatory Visit: Payer: Self-pay

## 2021-12-30 ENCOUNTER — Other Ambulatory Visit: Payer: Self-pay

## 2022-01-02 ENCOUNTER — Other Ambulatory Visit: Payer: Self-pay

## 2022-01-11 ENCOUNTER — Telehealth: Payer: Self-pay | Admitting: Pharmacy Technician

## 2022-01-11 ENCOUNTER — Other Ambulatory Visit (HOSPITAL_COMMUNITY): Payer: Self-pay

## 2022-01-11 NOTE — Telephone Encounter (Signed)
Oral Oncology Patient Advocate Encounter   Received notification that prior authorization for Venclexta is due for renewal.   PA submitted on 01/11/22 Key [id number] Status is pending     Lady Deutscher, CPhT-Adv Oncology Pharmacy Patient [place] Direct Number: [phone]  Fax: [phone]

## 2022-01-11 NOTE — Telephone Encounter (Signed)
Oral Oncology Patient Advocate Encounter  Prior Authorization for Lynita Lombard has been approved.    PA# [id number] Effective dates: 01/11/22 through 01/30/23  Patient may continue to fill at [facility].    Lady Deutscher, CPhT-Adv Oncology Pharmacy Patient [place] Direct Number: [phone]  Fax: [phone]

## 2022-01-16 DIAGNOSIS — I10 Essential (primary) hypertension: Secondary | ICD-10-CM | POA: Diagnosis not present

## 2022-01-16 DIAGNOSIS — R3911 Hesitancy of micturition: Secondary | ICD-10-CM | POA: Diagnosis not present

## 2022-01-16 DIAGNOSIS — N401 Enlarged prostate with lower urinary tract symptoms: Secondary | ICD-10-CM | POA: Diagnosis not present

## 2022-01-16 DIAGNOSIS — I951 Orthostatic hypotension: Secondary | ICD-10-CM | POA: Diagnosis not present

## 2022-01-16 DIAGNOSIS — C8300 Small cell B-cell lymphoma, unspecified site: Secondary | ICD-10-CM | POA: Diagnosis not present

## 2022-01-20 ENCOUNTER — Other Ambulatory Visit: Payer: Self-pay | Admitting: Internal Medicine

## 2022-01-25 ENCOUNTER — Telehealth: Payer: Self-pay | Admitting: Internal Medicine

## 2022-01-25 NOTE — Telephone Encounter (Signed)
Patient called to confirm upcoming appointment. Patient notified. Made correction from lab to port flush w/ lab

## 2022-01-25 NOTE — Progress Notes (Signed)
[facility] OFFICE PROGRESS NOTE  Joseph Dills, MD 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    PRIOR THERAPY: A tapered dose of prednisone but his anemia and thrombocytopenia were refractory to the steroids.    CURRENT THERAPY: Systemic treatment with obinutuzumab every 4 weeks for 6 months.  First dose starting 09/07/21.  In addition the patient will be treatment with venetoclax initially with a starting dose to be increased gradually to the standard dose of 400 mg p.o. daily for a total of 1 year. Status post 5 cycles of treatment.   INTERVAL HISTORY: Joseph Moyer 81 y.o. male returns to the clinic today for a follow-up visit.  The patient was found to have progressive small lymphocytic lymphoma a few months ago started treatment with obinutuzumab and venetoclax. He is currently on 400 mg of venetoclax and tolerating well.  The patient denies any abnormal bleeding or bruising. His energy is good. He denies shortness of breath. He denies any fever, chills, or night sweats.  Denies any lymphadenopathy.  Denies any nausea, vomiting, diarrhea, constipation, abdominal pain, abdominal fullness, early satiety.   Denies any signs or symptoms of infection including sore throat, skin infections, or dysuria.  denies cough. Denies peripheral neuropathy.  He is here today for evaluation and repeat blood work before undergoing cycle #6    MEDICAL HISTORY: Past Medical History:  Diagnosis Date   Allergy    Arthritis    CAD in native artery    COPD (chronic obstructive pulmonary disease) (HCC)    Frequent PVCs    GERD (gastroesophageal reflux disease)    Hyperlipidemia    Hypertension    Mild carotid artery disease (HCC)    Small lymphocytic lymphoma (HCC)     ALLERGIES:  is allergic to  obinutuzumab, oxycodone, ramipril, and rosuvastatin.  MEDICATIONS:  Current Outpatient Medications  Medication Sig Dispense Refill   allopurinol (ZYLOPRIM) 300 MG tablet Take 1 tablet (300 mg total) by mouth 2 (two) times daily. 60 tablet 0   amLODipine (NORVASC) 5 MG tablet Take 5 mg by mouth daily.     atorvastatin (LIPITOR) 40 MG tablet Take 1 tablet (40 mg total) by mouth daily. 90 tablet 1   diphenhydrAMINE HCl, Sleep, 50 MG CAPS Take 50 mg by mouth at bedtime.     FeFum-FePoly-FA-B Cmp-C-Biot (INTEGRA PLUS) CAPS Take 1 capsule by mouth daily. 30 capsule 3   finasteride (PROSCAR) 5 MG tablet Take 5 mg by mouth daily.     lidocaine-prilocaine (EMLA) cream Apply 1 Application topically as needed. 1-2 hours prior to treatment, apply a tsp of the numbing cream to the skin over the port site and cover with cling wrap or other wrap. Do not rub in the cream. Do not use on the area until the port site is healed . 30 g 0   losartan (COZAAR) 25 MG tablet Take 1 tablet (25 mg total) by mouth daily. 90 tablet 1   meloxicam (MOBIC) 15 MG tablet Take 15 mg by mouth daily as needed.     methocarbamol (ROBAXIN) 750 MG tablet Take 750 mg by mouth every 6 (six) hours as needed.     metoprolol succinate (TOPROL-XL) 25 MG 24 hr tablet TAKE 1 TABLET (25 MG TOTAL) BY MOUTH DAILY. 90 tablet 3   Naphazoline-Glycerin (CLEAR EYES REDNESS RELIEF OP)  Place 1 drop into both eyes as needed.     omeprazole (PRILOSEC) 20 MG capsule Take 1 capsule (20 mg total) by mouth daily. 90 capsule 0   prochlorperazine (COMPAZINE) 10 MG tablet TAKE 1 TABLET BY MOUTH EVERY 6 HOURS AS NEEDED FOR NAUSEA OR VOMITING. 30 tablet 0   umeclidinium bromide (INCRUSE ELLIPTA) 62.5 MCG/INH AEPB Inhale 1 puff into the lungs daily. 60 each 5   venetoclax (VENCLEXTA) 100 MG tablet Take 4 tablets (400 mg total) by mouth daily. Tablets should be swallowed whole with a meal and a full glass of water. 120 tablet 3   No current facility-administered  medications for this visit.    SURGICAL HISTORY:  Past Surgical History:  Procedure Laterality Date   BRONCHIAL NEEDLE ASPIRATION BIOPSY  03/12/2020   Procedure: BRONCHIAL NEEDLE ASPIRATION BIOPSIES;  Surgeon: Joseph Igo, DO;  Location: [facility];  Service: Pulmonary;;   CIRCUMCISION     COLONOSCOPY WITH PROPOFOL N/A 05/10/2015   Procedure: COLONOSCOPY WITH PROPOFOL;  Surgeon: Charolett Bumpers, MD;  Location: [facility];  Service: Endoscopy;  Laterality: N/A;   DECOMPRESSIVE LUMBAR LAMINECTOMY LEVEL 1 N/A 06/15/2018   Procedure: Delane Ginger decompression L1-L2/ complete inferior facetectomyof L1. Posterior lateral arthrodesiswith autograft bone L1-L2.;  Surgeon: Venita Lick, MD;  Location: [facility] OR;  Service: Orthopedics;  Laterality: N/A;   HEMORRHOID SURGERY     IR IMAGING GUIDED PORT INSERTION  09/29/2021   VIDEO BRONCHOSCOPY WITH ENDOBRONCHIAL ULTRASOUND N/A 03/12/2020   Procedure: VIDEO BRONCHOSCOPY WITH ENDOBRONCHIAL ULTRASOUND;  Surgeon: Joseph Igo, DO;  Location: [facility];  Service: Pulmonary;  Laterality: N/A;    REVIEW OF SYSTEMS:   Review of Systems  Constitutional: Negative for appetite change, chills, fatigue, fever and unexpected weight change.  HENT:   Negative for mouth sores, nosebleeds, sore throat and trouble swallowing.   Eyes: Negative for eye problems and icterus.  Respiratory: Negative for cough, hemoptysis, shortness of breath and wheezing.   Cardiovascular: Negative for chest pain and leg swelling.  Gastrointestinal: Negative for abdominal pain, constipation, diarrhea, nausea and vomiting.  Genitourinary: Negative for bladder incontinence, difficulty urinating, dysuria, frequency and hematuria.   Musculoskeletal: Negative for back pain, gait problem, neck pain and neck stiffness.  Skin: Negative for itching and rash.  Neurological: Negative for dizziness, extremity weakness, gait problem, headaches, light-headedness and seizures.  Hematological:  Negative for adenopathy. Does not bruise/bleed easily.  Psychiatric/Behavioral: Negative for confusion, depression and sleep disturbance. The patient is not nervous/anxious.     PHYSICAL EXAMINATION:  Blood pressure 127/60, pulse 73, temperature 98.1 F (36.7 C), temperature source Oral, resp. rate 18, weight 218 lb 1.6 oz (98.9 kg), SpO2 100 %.  ECOG PERFORMANCE STATUS: 1  Physical Exam  Constitutional: Oriented to person, place, and time and well-developed, well-nourished, and in no distress.  HENT:  Head: Normocephalic and atraumatic.  Mouth/Throat: Oropharynx is clear and moist. No oropharyngeal exudate.  Eyes: Conjunctivae are normal. Right eye exhibits no discharge. Left eye exhibits no discharge. No scleral icterus.  Neck: Normal range of motion. Neck supple.  Cardiovascular: Normal rate, regular rhythm, normal heart sounds and intact distal pulses.   Pulmonary/Chest: Effort normal and breath sounds normal. No respiratory distress. No wheezes. No rales.  Abdominal: Soft. Bowel sounds are normal. Exhibits no distension and no mass. There is no tenderness.  Musculoskeletal: Normal range of motion. Exhibits no edema.  Lymphadenopathy:    No cervical adenopathy.  Neurological: Alert and oriented to person, place, and time. Exhibits  normal muscle tone. Gait normal. Coordination normal.  Skin: Skin is warm and dry. No rash noted. Not diaphoretic. No erythema. No pallor.  Psychiatric: Mood, memory and judgment normal.  Vitals reviewed.  LABORATORY DATA: Lab Results  Component Value Date   WBC 4.6 01/26/2022   HGB 13.0 01/26/2022   HCT 39.1 01/26/2022   MCV 85.9 01/26/2022   PLT 255 01/26/2022      Chemistry      Component Value Date/Time   NA 139 01/26/2022 0808   NA 139 12/05/2021 1007   K 4.1 01/26/2022 0808   CL 105 01/26/2022 0808   CO2 28 01/26/2022 0808   BUN 19 01/26/2022 0808   BUN 16 12/05/2021 1007   CREATININE 1.01 01/26/2022 0808      Component Value  Date/Time   CALCIUM 9.3 01/26/2022 0808   ALKPHOS 59 01/26/2022 0808   AST 14 (L) 01/26/2022 0808   ALT 17 01/26/2022 0808   BILITOT 0.4 01/26/2022 0808       RADIOGRAPHIC STUDIES:  No results found.   ASSESSMENT/PLAN:  This is a very pleasant 81 years old African-American male diagnosed with a small lymphocytic lymphoma with generalized lymphadenopathy in the neck, chest, axilla, abdomen and pelvis. The core biopsy of the left inguinal lymph node was consistent with the above diagnosis.   Patient recently developed worsening anemia and thrombocytopenia. His imaging studies showed mildly progressive lymphadenopathy in the right axillary, porta hepatis, retroperitoneum, and pelvis.  The patient received a tapering dose of prednisone without any improvement in his thrombocytopenia.    Dr. Arbutus Ped recommended starting treatment with  obinutuzumab and venetoclax for the duration of 1 year.      The patient is currently on treatment with obinutuzumab and venetoclax.  He is status post 5 cycles of obinutuzumab and he is currently on venetoclax 400 mg daily.    Reviewed the patient's labs with Dr. Arbutus Ped today.  His total white blood cell count is normal at 4.6 but his ANC is a little low at 1.2.  I reviewed with Dr. Arbutus Ped and the patient is okay to proceed with his last cycle of obinutuzumab as scheduled today.  I will arrange for repeat CT scan of the chest, neck, abdomen, and pelvis to be performed in approximately 1 month.  We will see him back for labs and a follow up visit a few days later to review the scan results.  The patient was instructed to call the clinic should he develop any signs and symptoms of infection in the interval including fevers, sore throat, nasal congestion, cough, shortness of breath, diarrhea, abdominal pain, skin infections, or dysuria.  The patient was advised to call immediately if he has any concerning symptoms in the interval. The patient voices  understanding of current disease status and treatment options and is in agreement with the current care plan. All questions were answered. The patient knows to call the clinic with any problems, questions or concerns. We can certainly see the patient much sooner if necessary    Orders Placed This Encounter  Procedures   CT CHEST ABDOMEN PELVIS W CONTRAST    Standing Status:   Future    Standing Expiration Date:   01/27/2023    Order Specific Question:   If indicated for the ordered procedure, I authorize the administration of contrast media per Radiology protocol    Answer:   Yes    Order Specific Question:   Does the patient have a contrast media/X-ray dye  allergy?    Answer:   No    Order Specific Question:   Preferred imaging location?    Answer:   [facility]    Order Specific Question:   Is Oral Contrast requested for this exam?    Answer:   Yes, Per Radiology protocol   CT Soft Tissue Neck W Contrast    Standing Status:   Future    Standing Expiration Date:   01/26/2023    Order Specific Question:   If indicated for the ordered procedure, I authorize the administration of contrast media per Radiology protocol    Answer:   Yes    Order Specific Question:   Does the patient have a contrast media/X-ray dye allergy?    Answer:   No    Order Specific Question:   Preferred imaging location?    Answer:   [facility]   CBC with Differential ([facility] Only)    Standing Status:   Future    Standing Expiration Date:   01/27/2023   CMP ([facility] only)    Standing Status:   Future    Standing Expiration Date:   01/27/2023   Lactate dehydrogenase (LDH)    Standing Status:   Future    Standing Expiration Date:   01/26/2023     The total time spent in the appointment was 20-29 minutes   Ian Cavey L Ladajah Soltys, PA-C 01/26/22

## 2022-01-26 ENCOUNTER — Other Ambulatory Visit: Payer: Medicare HMO

## 2022-01-26 ENCOUNTER — Inpatient Hospital Stay: Payer: Medicare HMO | Attending: Physician Assistant | Admitting: Physician Assistant

## 2022-01-26 ENCOUNTER — Other Ambulatory Visit: Payer: Self-pay | Admitting: Internal Medicine

## 2022-01-26 ENCOUNTER — Other Ambulatory Visit: Payer: Self-pay

## 2022-01-26 ENCOUNTER — Inpatient Hospital Stay: Payer: Medicare HMO

## 2022-01-26 VITALS — BP 127/60 | HR 73 | Temp 98.1°F | Resp 18 | Wt 218.1 lb

## 2022-01-26 VITALS — BP 134/69 | HR 70 | Temp 98.0°F | Resp 18

## 2022-01-26 DIAGNOSIS — Z5112 Encounter for antineoplastic immunotherapy: Secondary | ICD-10-CM | POA: Diagnosis not present

## 2022-01-26 DIAGNOSIS — C8308 Small cell B-cell lymphoma, lymph nodes of multiple sites: Secondary | ICD-10-CM | POA: Diagnosis not present

## 2022-01-26 DIAGNOSIS — Z5111 Encounter for antineoplastic chemotherapy: Secondary | ICD-10-CM

## 2022-01-26 DIAGNOSIS — Z79899 Other long term (current) drug therapy: Secondary | ICD-10-CM | POA: Insufficient documentation

## 2022-01-26 DIAGNOSIS — C8300 Small cell B-cell lymphoma, unspecified site: Secondary | ICD-10-CM

## 2022-01-26 LAB — CBC WITH DIFFERENTIAL (CANCER CENTER ONLY)
Abs Immature Granulocytes: 0.01 10*3/uL (ref 0.00–0.07)
Basophils Absolute: 0 10*3/uL (ref 0.0–0.1)
Basophils Relative: 0 %
Eosinophils Absolute: 0 10*3/uL (ref 0.0–0.5)
Eosinophils Relative: 0 %
HCT: 39.1 % (ref 39.0–52.0)
Hemoglobin: 13 g/dL (ref 13.0–17.0)
Immature Granulocytes: 0 %
Lymphocytes Relative: 60 %
Lymphs Abs: 2.7 10*3/uL (ref 0.7–4.0)
MCH: 28.6 pg (ref 26.0–34.0)
MCHC: 33.2 g/dL (ref 30.0–36.0)
MCV: 85.9 fL (ref 80.0–100.0)
Monocytes Absolute: 0.7 10*3/uL (ref 0.1–1.0)
Monocytes Relative: 14 %
Neutro Abs: 1.2 10*3/uL — ABNORMAL LOW (ref 1.7–7.7)
Neutrophils Relative %: 26 %
Platelet Count: 255 10*3/uL (ref 150–400)
RBC: 4.55 MIL/uL (ref 4.22–5.81)
RDW: 14.8 % (ref 11.5–15.5)
WBC Count: 4.6 10*3/uL (ref 4.0–10.5)
nRBC: 0 % (ref 0.0–0.2)

## 2022-01-26 LAB — CMP (CANCER CENTER ONLY)
ALT: 17 U/L (ref 0–44)
AST: 14 U/L — ABNORMAL LOW (ref 15–41)
Albumin: 4.3 g/dL (ref 3.5–5.0)
Alkaline Phosphatase: 59 U/L (ref 38–126)
Anion gap: 6 (ref 5–15)
BUN: 19 mg/dL (ref 8–23)
CO2: 28 mmol/L (ref 22–32)
Calcium: 9.3 mg/dL (ref 8.9–10.3)
Chloride: 105 mmol/L (ref 98–111)
Creatinine: 1.01 mg/dL (ref 0.61–1.24)
GFR, Estimated: >60 mL/min (ref >60)
Glucose, Bld: 112 mg/dL — ABNORMAL HIGH (ref 70–99)
Potassium: 4.1 mmol/L (ref 3.5–5.1)
Sodium: 139 mmol/L (ref 135–145)
Total Bilirubin: 0.4 mg/dL (ref 0.3–1.2)
Total Protein: 7.3 g/dL (ref 6.5–8.1)

## 2022-01-26 MED ORDER — HEPARIN SOD (PORK) LOCK FLUSH 100 UNIT/ML IV SOLN
500.0000 [IU] | Freq: Once | INTRAVENOUS | Status: AC | PRN
  Administered 2022-01-26: 500 [IU]

## 2022-01-26 MED ORDER — SODIUM CHLORIDE 0.9 % IV SOLN
1000.0000 mg | Freq: Once | INTRAVENOUS | Status: AC
  Administered 2022-01-26: 1000 mg via INTRAVENOUS
  Filled 2022-01-26: qty 40

## 2022-01-26 MED ORDER — METHYLPREDNISOLONE SODIUM SUCC 125 MG IJ SOLR
60.0000 mg | Freq: Once | INTRAMUSCULAR | Status: AC
  Administered 2022-01-26: 60 mg via INTRAVENOUS
  Filled 2022-01-26: qty 2

## 2022-01-26 MED ORDER — FAMOTIDINE IN NACL 20-0.9 MG/50ML-% IV SOLN
20.0000 mg | Freq: Once | INTRAVENOUS | Status: AC
  Administered 2022-01-26: 20 mg via INTRAVENOUS
  Filled 2022-01-26: qty 50

## 2022-01-26 MED ORDER — DIPHENHYDRAMINE HCL 50 MG/ML IJ SOLN
50.0000 mg | Freq: Once | INTRAMUSCULAR | Status: AC
  Administered 2022-01-26: 50 mg via INTRAVENOUS
  Filled 2022-01-26: qty 1

## 2022-01-26 MED ORDER — SODIUM CHLORIDE 0.9 % IV SOLN
20.0000 mg | Freq: Once | INTRAVENOUS | Status: AC
  Administered 2022-01-26: 20 mg via INTRAVENOUS
  Filled 2022-01-26: qty 20

## 2022-01-26 MED ORDER — SODIUM CHLORIDE 0.9 % IV SOLN: Freq: Once | INTRAVENOUS | Status: AC

## 2022-01-26 MED ORDER — SODIUM CHLORIDE 0.9% FLUSH
10.0000 mL | INTRAVENOUS | Status: DC | PRN
  Administered 2022-01-26: 10 mL

## 2022-01-26 MED ORDER — ACETAMINOPHEN 325 MG PO TABS
650.0000 mg | ORAL_TABLET | Freq: Once | ORAL | Status: AC
  Administered 2022-01-26: 650 mg via ORAL
  Filled 2022-01-26: qty 2

## 2022-01-26 NOTE — Progress Notes (Signed)
Per Cassie PA-C OK to trt with ANC 1.2 K/uL today

## 2022-01-26 NOTE — Patient Instructions (Signed)
[facility]  Discharge Instructions: Thank you for choosing [facility] to provide your oncology and hematology care.   If you have a lab appointment with the [facility], please go directly to the [facility] and check in at the registration area.   Wear comfortable clothing and clothing appropriate for easy access to any Portacath or PICC line.   We strive to give you quality time with your provider. You may need to reschedule your appointment if you arrive late (15 or more minutes).  Arriving late affects you and other patients whose appointments are after yours.  Also, if you miss three or more appointments without notifying the office, you may be dismissed from the clinic at the provider's discretion.      For prescription refill requests, have your pharmacy contact our office and allow 72 hours for refills to be completed.    Today you received the following chemotherapy and/or immunotherapy agents: Gazyva      To help prevent nausea and vomiting after your treatment, we encourage you to take your nausea medication as directed.  BELOW ARE SYMPTOMS THAT SHOULD BE REPORTED IMMEDIATELY: *FEVER GREATER THAN 100.4 F (38 C) OR HIGHER *CHILLS OR SWEATING *NAUSEA AND VOMITING THAT IS NOT CONTROLLED WITH YOUR NAUSEA MEDICATION *UNUSUAL SHORTNESS OF BREATH *UNUSUAL BRUISING OR BLEEDING *URINARY PROBLEMS (pain or burning when urinating, or frequent urination) *BOWEL PROBLEMS (unusual diarrhea, constipation, pain near the anus) TENDERNESS IN MOUTH AND THROAT WITH OR WITHOUT PRESENCE OF ULCERS (sore throat, sores in mouth, or a toothache) UNUSUAL RASH, SWELLING OR PAIN  UNUSUAL VAGINAL DISCHARGE OR ITCHING   Items with * indicate a potential emergency and should be followed up as soon as possible or go to the Emergency Department if any problems should occur.  Please show the CHEMOTHERAPY ALERT CARD or IMMUNOTHERAPY ALERT CARD at check-in to the  Emergency Department and triage nurse.  Should you have questions after your visit or need to cancel or reschedule your appointment, please contact [facility]  Dept: [phone]  and follow the prompts.  Office hours are 8:00 a.m. to 4:30 p.m. Monday - Friday. Please note that voicemails left after 4:00 p.m. may not be returned until the following business day.  We are closed weekends and major holidays. You have access to a nurse at all times for urgent questions. Please call the main number to the clinic Dept: [phone] and follow the prompts.   For any non-urgent questions, you may also contact your provider using MyChart. We now offer e-Visits for anyone 75 and older to request care online for non-urgent symptoms. For details visit [email or web address].   Also download the MyChart app! Go to the app store, search "MyChart", open the app, select [facility], and log in with your MyChart username and password.

## 2022-01-27 ENCOUNTER — Other Ambulatory Visit: Payer: Self-pay

## 2022-01-28 ENCOUNTER — Other Ambulatory Visit: Payer: Self-pay

## 2022-02-02 ENCOUNTER — Other Ambulatory Visit: Payer: Self-pay

## 2022-02-02 ENCOUNTER — Other Ambulatory Visit (HOSPITAL_COMMUNITY): Payer: Self-pay

## 2022-02-03 ENCOUNTER — Other Ambulatory Visit: Payer: Self-pay

## 2022-02-03 ENCOUNTER — Other Ambulatory Visit (HOSPITAL_COMMUNITY): Payer: Self-pay

## 2022-02-06 ENCOUNTER — Other Ambulatory Visit (HOSPITAL_COMMUNITY): Payer: Self-pay

## 2022-02-07 ENCOUNTER — Telehealth: Payer: Self-pay | Admitting: Pharmacy Technician

## 2022-02-07 ENCOUNTER — Encounter: Payer: Self-pay | Admitting: Internal Medicine

## 2022-02-07 ENCOUNTER — Other Ambulatory Visit (HOSPITAL_COMMUNITY): Payer: Self-pay

## 2022-02-07 NOTE — Telephone Encounter (Signed)
Oral Oncology Patient Advocate Encounter   Was successful in securing patient an $3,250 grant from [facility]) to provide copayment coverage for Venclexta.  This will keep the out of pocket expense at $0.     I have spoken with the patient.    The billing information is as follows and has been shared with [facility].   Member ID: [id number] Group ID: [id number] RxBin: [id number] Dates of Eligibility: 11/09/21 through 02/07/23  Fund:  [place], CPhT-Adv Oncology Pharmacy Patient [place] Direct Number: [phone]  Fax: [phone]

## 2022-02-15 ENCOUNTER — Other Ambulatory Visit: Payer: Self-pay | Admitting: Internal Medicine

## 2022-02-15 ENCOUNTER — Other Ambulatory Visit (HOSPITAL_COMMUNITY): Payer: Self-pay

## 2022-02-15 MED ORDER — VENETOCLAX 100 MG PO TABS
400.0000 mg | ORAL_TABLET | Freq: Every day | ORAL | 3 refills | Status: DC
  Filled 2022-02-15: qty 120, 30d supply, fill #0
  Filled 2022-03-14: qty 120, 30d supply, fill #1
  Filled 2022-05-31: qty 120, 30d supply, fill #2
  Filled 2022-06-16: qty 120, 30d supply, fill #3
  Filled 2022-06-16: qty 112, 28d supply, fill #3

## 2022-02-20 ENCOUNTER — Other Ambulatory Visit (HOSPITAL_COMMUNITY): Payer: Self-pay

## 2022-02-20 NOTE — Progress Notes (Unsigned)
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     PRIOR THERAPY:  1) A tapered dose of prednisone but his anemia and thrombocytopenia were refractory to the steroids.   2) Systemic treatment with obinutuzumab every 4 weeks for 6 months.  First dose starting 09/07/21. Completed 01/26/22  CURRENT THERAPY: venetoclax initially with a starting dose to be increased gradually to the standard dose of 400 mg p.o. daily for a total of 1 year. started 09/28/21   INTERVAL HISTORY: Joseph Moyer 82 y.o. male returns to the clinic today for a follow-up visit.  The patient was found to have progressive small lymphocytic lymphoma about 6 months ago started treatment with obinutuzumab and venetoclax. He completed his 6 months of Obinutuzmab on 01/26/22. He tolerated this well.   He is currently on 400 mg of venetoclax and tolerating well.  The patient denies any abnormal bleeding or bruising. His energy is good. He denies shortness of breath. He mentions he needs a refill of what sounds like is albuterol, although I do not see this on his medication list. He denies any fever, chills, or night sweats.  Denies any lymphadenopathy.  Denies any nausea, vomiting, diarrhea, constipation, abdominal pain, abdominal fullness, early satiety.  Denies any signs or symptoms of infection including sore throat, or skin infections. However, he mentions sometimes he has malodorous urine. Denies history of UTI. Denies peripheral neuropathy.  He recently had a restaging CT scan performed.  He is here today for evaluation and to review his scan results.   MEDICAL HISTORY: Past Medical History:  Diagnosis Date   Allergy    Arthritis    CAD in native artery    COPD (chronic  obstructive pulmonary disease) (HCC)    Frequent PVCs    GERD (gastroesophageal reflux disease)    Hyperlipidemia    Hypertension    Mild carotid artery disease (HCC)    Small lymphocytic lymphoma (HCC)     ALLERGIES:  is allergic to obinutuzumab, oxycodone, ramipril, and rosuvastatin.  MEDICATIONS:  Current Outpatient Medications  Medication Sig Dispense Refill   allopurinol (ZYLOPRIM) 300 MG tablet Take 1 tablet (300 mg total) by mouth 2 (two) times daily. 60 tablet 0   amLODipine (NORVASC) 5 MG tablet Take 5 mg by mouth daily.     atorvastatin (LIPITOR) 40 MG tablet Take 1 tablet (40 mg total) by mouth daily. 90 tablet 1   diphenhydrAMINE HCl, Sleep, 50 MG CAPS Take 50 mg by mouth at bedtime.     FeFum-FePoly-FA-B Cmp-C-Biot (INTEGRA PLUS) CAPS Take 1 capsule by mouth daily. 30 capsule 3   finasteride (PROSCAR) 5 MG tablet Take 5 mg by mouth daily.     lidocaine-prilocaine (EMLA) cream Apply 1 Application topically as needed. 1-2 hours prior to treatment, apply a tsp of the numbing cream to the skin over the port site and cover with cling wrap or other wrap. Do not rub in the cream. Do not use on the area until the port site is healed . 30 g 0   losartan (COZAAR) 25 MG tablet Take 1 tablet (25 mg total) by mouth daily. 90 tablet 1   meloxicam (MOBIC) 15 MG tablet Take 15 mg by mouth daily as needed.     methocarbamol (ROBAXIN) 750 MG tablet  Take 750 mg by mouth every 6 (six) hours as needed.     metoprolol succinate (TOPROL-XL) 25 MG 24 hr tablet TAKE 1 TABLET (25 MG TOTAL) BY MOUTH DAILY. 90 tablet 3   Naphazoline-Glycerin (CLEAR EYES REDNESS RELIEF OP) Place 1 drop into both eyes as needed.     omeprazole (PRILOSEC) 20 MG capsule Take 1 capsule (20 mg total) by mouth daily. 90 capsule 0   prochlorperazine (COMPAZINE) 10 MG tablet TAKE 1 TABLET BY MOUTH EVERY 6 HOURS AS NEEDED FOR NAUSEA OR VOMITING. 30 tablet 0   umeclidinium bromide (INCRUSE ELLIPTA) 62.5 MCG/INH AEPB Inhale 1 puff  into the lungs daily. 60 each 5   venetoclax (VENCLEXTA) 100 MG tablet Take 4 tablets (400 mg total) by mouth daily. Tablets should be swallowed whole with a meal and a full glass of water. 120 tablet 3   No current facility-administered medications for this visit.    SURGICAL HISTORY:  Past Surgical History:  Procedure Laterality Date   BRONCHIAL NEEDLE ASPIRATION BIOPSY  03/12/2020   Procedure: BRONCHIAL NEEDLE ASPIRATION BIOPSIES;  Surgeon: Garner Nash, DO;  Location: [facility];  Service: Pulmonary;;   CIRCUMCISION     COLONOSCOPY WITH PROPOFOL N/A 05/10/2015   Procedure: COLONOSCOPY WITH PROPOFOL;  Surgeon: Garlan Fair, MD;  Location: [facility];  Service: Endoscopy;  Laterality: N/A;   DECOMPRESSIVE LUMBAR LAMINECTOMY LEVEL 1 N/A 06/15/2018   Procedure: Gordy Levan decompression L1-L2/ complete inferior facetectomyof L1. Posterior lateral arthrodesiswith autograft bone L1-L2.;  Surgeon: Melina Schools, MD;  Location: [facility];  Service: Orthopedics;  Laterality: N/A;   HEMORRHOID SURGERY     IR IMAGING GUIDED PORT INSERTION  09/29/2021   VIDEO BRONCHOSCOPY WITH ENDOBRONCHIAL ULTRASOUND N/A 03/12/2020   Procedure: VIDEO BRONCHOSCOPY WITH ENDOBRONCHIAL ULTRASOUND;  Surgeon: Garner Nash, DO;  Location: [facility];  Service: Pulmonary;  Laterality: N/A;    REVIEW OF SYSTEMS:   Review of Systems  Constitutional: Negative for appetite change, chills, fatigue, fever and unexpected weight change.  HENT: Negative for mouth sores, nosebleeds, sore throat and trouble swallowing.   Eyes: Negative for eye problems and icterus.  Respiratory: Negative for cough, hemoptysis, shortness of breath and wheezing.   Cardiovascular: Negative for chest pain and leg swelling.  Gastrointestinal: Negative for abdominal pain, constipation, diarrhea, nausea and vomiting.  Genitourinary: Positive for occasional malodorous urine.  Negative for bladder incontinence, difficulty urinating, dysuria, frequency  and hematuria.   Musculoskeletal: Negative for back pain, gait problem, neck pain and neck stiffness.  Skin: Negative for itching and rash.  Neurological: Negative for dizziness, extremity weakness, gait problem, headaches, light-headedness and seizures.  Hematological: Negative for adenopathy. Does not bruise/bleed easily.  Psychiatric/Behavioral: Negative for confusion, depression and sleep disturbance. The patient is not nervous/anxious.     PHYSICAL EXAMINATION:  There were no vitals taken for this visit.  ECOG PERFORMANCE STATUS: 1  Physical Exam  Constitutional: Oriented to person, place, and time and well-developed, well-nourished, and in no distress.  HENT:  Head: Normocephalic and atraumatic.  Mouth/Throat: Oropharynx is clear and moist. No oropharyngeal exudate.  Eyes: Conjunctivae are normal. Right eye exhibits no discharge. Left eye exhibits no discharge. No scleral icterus.  Neck: Normal range of motion. Neck supple.  Cardiovascular: Normal rate, regular rhythm, normal heart sounds and intact distal pulses.   Pulmonary/Chest: Effort normal and breath sounds normal. No respiratory distress. No wheezes. No rales.  Abdominal: Soft. Bowel sounds are normal. Exhibits no distension and no mass. There is no  tenderness.  Musculoskeletal: Normal range of motion. Exhibits no edema.  Lymphadenopathy:    No cervical adenopathy.  Neurological: Alert and oriented to person, place, and time. Exhibits normal muscle tone. Gait normal. Coordination normal.  Skin: Skin is warm and dry. No rash noted. Not diaphoretic. No erythema. No pallor.  Psychiatric: Mood, memory and judgment normal.  Vitals reviewed.  LABORATORY DATA: Lab Results  Component Value Date   WBC 4.6 01/26/2022   HGB 13.0 01/26/2022   HCT 39.1 01/26/2022   MCV 85.9 01/26/2022   PLT 255 01/26/2022      Chemistry      Component Value Date/Time   NA 139 01/26/2022 0808   NA 139 12/05/2021 1007   K 4.1 01/26/2022  0808   CL 105 01/26/2022 0808   CO2 28 01/26/2022 0808   BUN 19 01/26/2022 0808   BUN 16 12/05/2021 1007   CREATININE 1.01 01/26/2022 0808      Component Value Date/Time   CALCIUM 9.3 01/26/2022 0808   ALKPHOS 59 01/26/2022 0808   AST 14 (L) 01/26/2022 0808   ALT 17 01/26/2022 0808   BILITOT 0.4 01/26/2022 0808       RADIOGRAPHIC STUDIES:  No results found.   ASSESSMENT/PLAN:  This is a very pleasant 82 years old African-American male diagnosed with a small lymphocytic lymphoma with generalized lymphadenopathy in the neck, chest, axilla, abdomen and pelvis. The core biopsy of the left inguinal lymph node was consistent with the above diagnosis.   Patient recently developed worsening anemia and thrombocytopenia. His imaging studies showed mildly progressive lymphadenopathy in the right axillary, porta hepatis, retroperitoneum, and pelvis.  The patient received a tapering dose of prednisone without any improvement in his thrombocytopenia.   Dr. Julien Nordmann recommended starting treatment with  obinutuzumab for 6 months and venetoclax for the duration of 1 year.       He is status post 6 cycles of obinutuzumab. 6 months of treatment was completed on 01/26/22. He is currently on venetoclax 400 mg daily. He started his first dose of venetoclax on 09/28/21.   The patient recently had a restaging CT scan performed.  Dr. Julien Nordmann personally independently reviewed the scan discussed results with the patient today.  The scan showed positive response to treatment with enlarged lymph nodes in the neck, chest, abdomen, and pelvis.  Scan had several incidental findings.  There is a stable enlarged nodule near the thyroid that was not hypermetabolic on PET scan.  We will continue to monitor this closely.  Patient scan also noted some colonic diverticulosis with some subtle inflammatory changes.  Findings could be early or mild acute diverticulitis.  The patient denies any symptoms today.  Denies any  fever, abdominal pain, blood in the stool, or changes in his bowel habits.  I printed the patient out information about diverticulitis.  If he develops any signs or symptoms he was advised to be evaluated immediately.  I also printed him a handout of clear liquid diet and encouraged him to have a liquid diet for the next 3 days or so just to be on the safe side.  The patient scan also noted mild bladder wall thickening.  The patient reports occasional malodorous urine that is cloudy.  We will check a UA and culture today to rule out infection.  Dr. Julien Nordmann recommends he continue on venetoclax 400 mg daily.   We will see the patient back for follow-up visit in 4 weeks for evaluation and repeat blood work. His CMP  is pending. If there are any concerning findings, then I will call for recommendations.    The patient was advised to call immediately if he has any concerning symptoms in the interval. The patient voices understanding of current disease status and treatment options and is in agreement with the current care plan. All questions were answered. The patient knows to call the clinic with any problems, questions or concerns. We can certainly see the patient much sooner if necessary      No orders of the defined types were placed in this encounter.   Adrionna Delcid L Nehemiah Mcfarren, PA-C 02/20/22   ADDENDUM: Hematology/Oncology Attending: I had the rest of his encounter with the patient today.  I reviewed his record, lab, scan and recommended his care plan.  This is a very pleasant 82 years old African-American male with a small lymphocytic lymphoma presented with generalized lymphadenopathy in the neck, mediastinum, axilla, abdomen as well as inguinal area in February 2022.  He was treated with a tapered dose of prednisone initially for his anemia and thrombocytopenia but was refractory to the steroids.  The patient started systemic therapy with obinutuzumab every 4 weeks for 6 cycles concurrent  with venetoclax status post 6 months of treatment.  He is currently on treatment with oral venetoclax 400 mg p.o. daily.  He has been tolerating this treatment well with no concerning adverse effect except for mild fatigue. He had repeat CT scan of the chest, abdomen and pelvis performed recently.  I personally and independently reviewed the scans and discussed the result with the patient today. His scan showed significant improvement of his disease. I recommended for the patient to continue his current treatment with venetoclax. He will come back for follow-up visit in 1 months for evaluation and repeat blood work. For the bladder wall thickening seen on the scan and the patient is complaining of mild change in his urine with bad smell,  will check his urinalysis and treat him if needed. The patient was advised to call immediately if he has any other concerning symptoms in the interval. The total time spent in the appointment was 30 minutes. Disclaimer: This note was dictated with voice recognition software. Similar sounding words can inadvertently be transcribed and may be missed upon review. Eilleen Kempf, MD

## 2022-02-21 ENCOUNTER — Ambulatory Visit (HOSPITAL_COMMUNITY)
Admission: RE | Admit: 2022-02-21 | Discharge: 2022-02-21 | Disposition: A | Discharge status: Home / Self Care | Payer: Medicare HMO | Source: Ambulatory Visit | Attending: Physician Assistant | Admitting: Physician Assistant

## 2022-02-21 DIAGNOSIS — I2510 Atherosclerotic heart disease of native coronary artery without angina pectoris: Secondary | ICD-10-CM | POA: Diagnosis not present

## 2022-02-21 DIAGNOSIS — K8020 Calculus of gallbladder without cholecystitis without obstruction: Secondary | ICD-10-CM | POA: Diagnosis not present

## 2022-02-21 DIAGNOSIS — R591 Generalized enlarged lymph nodes: Secondary | ICD-10-CM | POA: Diagnosis not present

## 2022-02-21 DIAGNOSIS — C8300 Small cell B-cell lymphoma, unspecified site: Secondary | ICD-10-CM | POA: Diagnosis not present

## 2022-02-21 DIAGNOSIS — K5730 Diverticulosis of large intestine without perforation or abscess without bleeding: Secondary | ICD-10-CM | POA: Diagnosis not present

## 2022-02-21 DIAGNOSIS — N400 Enlarged prostate without lower urinary tract symptoms: Secondary | ICD-10-CM | POA: Diagnosis not present

## 2022-02-21 DIAGNOSIS — R599 Enlarged lymph nodes, unspecified: Secondary | ICD-10-CM | POA: Diagnosis not present

## 2022-02-21 DIAGNOSIS — C9590 Leukemia, unspecified not having achieved remission: Secondary | ICD-10-CM | POA: Diagnosis not present

## 2022-02-21 DIAGNOSIS — N3289 Other specified disorders of bladder: Secondary | ICD-10-CM | POA: Diagnosis not present

## 2022-02-21 DIAGNOSIS — I700 Atherosclerosis of aorta: Secondary | ICD-10-CM | POA: Diagnosis not present

## 2022-02-21 MED ORDER — SODIUM CHLORIDE (PF) 0.9 % IJ SOLN
INTRAMUSCULAR | Status: AC
  Filled 2022-02-21: qty 50

## 2022-02-21 MED ORDER — IOHEXOL 300 MG/ML  SOLN
100.0000 mL | Freq: Once | INTRAMUSCULAR | Status: AC | PRN
  Administered 2022-02-21: 100 mL via INTRAVENOUS

## 2022-02-21 MED ORDER — IOHEXOL 9 MG/ML PO SOLN
ORAL | Status: AC
  Filled 2022-02-21: qty 1000

## 2022-02-21 MED ORDER — IOHEXOL 9 MG/ML PO SOLN
500.0000 mL | ORAL | Status: AC
  Administered 2022-02-21 (×2): 500 mL via ORAL

## 2022-02-21 MED ORDER — HEPARIN SOD (PORK) LOCK FLUSH 100 UNIT/ML IV SOLN
INTRAVENOUS | Status: AC
  Administered 2022-02-21: 500 [IU]
  Filled 2022-02-21: qty 5

## 2022-02-22 ENCOUNTER — Inpatient Hospital Stay: Payer: Medicare HMO | Attending: Physician Assistant | Admitting: Physician Assistant

## 2022-02-22 ENCOUNTER — Other Ambulatory Visit: Payer: Self-pay

## 2022-02-22 ENCOUNTER — Inpatient Hospital Stay: Payer: Medicare HMO

## 2022-02-22 VITALS — BP 126/68 | HR 68 | Temp 98.0°F | Resp 18 | Ht 71.0 in | Wt 216.6 lb

## 2022-02-22 DIAGNOSIS — Z95828 Presence of other vascular implants and grafts: Secondary | ICD-10-CM

## 2022-02-22 DIAGNOSIS — R300 Dysuria: Secondary | ICD-10-CM | POA: Diagnosis not present

## 2022-02-22 DIAGNOSIS — C8300 Small cell B-cell lymphoma, unspecified site: Secondary | ICD-10-CM | POA: Diagnosis not present

## 2022-02-22 DIAGNOSIS — R82998 Other abnormal findings in urine: Secondary | ICD-10-CM | POA: Insufficient documentation

## 2022-02-22 DIAGNOSIS — D649 Anemia, unspecified: Secondary | ICD-10-CM | POA: Diagnosis not present

## 2022-02-22 DIAGNOSIS — D696 Thrombocytopenia, unspecified: Secondary | ICD-10-CM | POA: Insufficient documentation

## 2022-02-22 DIAGNOSIS — T451X5A Adverse effect of antineoplastic and immunosuppressive drugs, initial encounter: Secondary | ICD-10-CM

## 2022-02-22 DIAGNOSIS — C8308 Small cell B-cell lymphoma, lymph nodes of multiple sites: Secondary | ICD-10-CM | POA: Insufficient documentation

## 2022-02-22 LAB — CMP (CANCER CENTER ONLY)
ALT: 20 U/L (ref 0–44)
AST: 21 U/L (ref 15–41)
Albumin: 4.2 g/dL (ref 3.5–5.0)
Alkaline Phosphatase: 64 U/L (ref 38–126)
Anion gap: 8 (ref 5–15)
BUN: 17 mg/dL (ref 8–23)
CO2: 27 mmol/L (ref 22–32)
Calcium: 9 mg/dL (ref 8.9–10.3)
Chloride: 104 mmol/L (ref 98–111)
Creatinine: 1.01 mg/dL (ref 0.61–1.24)
GFR, Estimated: >60 mL/min (ref >60)
Glucose, Bld: 108 mg/dL — ABNORMAL HIGH (ref 70–99)
Potassium: 3.9 mmol/L (ref 3.5–5.1)
Sodium: 139 mmol/L (ref 135–145)
Total Bilirubin: 0.3 mg/dL (ref 0.3–1.2)
Total Protein: 7.4 g/dL (ref 6.5–8.1)

## 2022-02-22 LAB — URINALYSIS, COMPLETE (UACMP) WITH MICROSCOPIC
Bacteria, UA: NONE SEEN
Bilirubin Urine: NEGATIVE
Glucose, UA: NEGATIVE mg/dL
Hgb urine dipstick: NEGATIVE
Ketones, ur: NEGATIVE mg/dL
Leukocytes,Ua: NEGATIVE
Nitrite: NEGATIVE
Protein, ur: NEGATIVE mg/dL
Specific Gravity, Urine: 1.024 (ref 1.005–1.030)
pH: 5 (ref 5.0–8.0)

## 2022-02-22 LAB — CBC WITH DIFFERENTIAL (CANCER CENTER ONLY)
Abs Immature Granulocytes: 0.01 10*3/uL (ref 0.00–0.07)
Basophils Absolute: 0 10*3/uL (ref 0.0–0.1)
Basophils Relative: 1 %
Eosinophils Absolute: 0.3 10*3/uL (ref 0.0–0.5)
Eosinophils Relative: 7 %
HCT: 39.9 % (ref 39.0–52.0)
Hemoglobin: 13.3 g/dL (ref 13.0–17.0)
Immature Granulocytes: 0 %
Lymphocytes Relative: 46 %
Lymphs Abs: 2.3 10*3/uL (ref 0.7–4.0)
MCH: 27.8 pg (ref 26.0–34.0)
MCHC: 33.3 g/dL (ref 30.0–36.0)
MCV: 83.3 fL (ref 80.0–100.0)
Monocytes Absolute: 0.7 10*3/uL (ref 0.1–1.0)
Monocytes Relative: 15 %
Neutro Abs: 1.5 10*3/uL — ABNORMAL LOW (ref 1.7–7.7)
Neutrophils Relative %: 31 %
Platelet Count: 246 10*3/uL (ref 150–400)
RBC: 4.79 MIL/uL (ref 4.22–5.81)
RDW: 14.6 % (ref 11.5–15.5)
WBC Count: 4.8 10*3/uL (ref 4.0–10.5)
nRBC: 0 % (ref 0.0–0.2)

## 2022-02-22 LAB — LACTATE DEHYDROGENASE: LDH: 203 U/L — ABNORMAL HIGH (ref 98–192)

## 2022-02-22 MED ORDER — SODIUM CHLORIDE 0.9% FLUSH
10.0000 mL | Freq: Once | INTRAVENOUS | Status: AC
  Administered 2022-02-22: 10 mL

## 2022-02-22 MED ORDER — HEPARIN SOD (PORK) LOCK FLUSH 100 UNIT/ML IV SOLN
500.0000 [IU] | Freq: Once | INTRAVENOUS | Status: AC
  Administered 2022-02-22: 500 [IU]

## 2022-02-23 ENCOUNTER — Other Ambulatory Visit: Payer: Self-pay

## 2022-02-23 LAB — URINE CULTURE: Culture: NO GROWTH

## 2022-02-24 ENCOUNTER — Telehealth: Payer: Self-pay | Admitting: Physician Assistant

## 2022-02-24 NOTE — Telephone Encounter (Signed)
Called patient to confirm upcoming appointment. Patient notified.

## 2022-02-25 ENCOUNTER — Other Ambulatory Visit: Payer: Self-pay | Admitting: Internal Medicine

## 2022-03-07 ENCOUNTER — Telehealth: Payer: Self-pay | Admitting: Internal Medicine

## 2022-03-07 NOTE — Telephone Encounter (Signed)
Called patient regarding upcoming February appointments, left a voicemail.

## 2022-03-10 ENCOUNTER — Other Ambulatory Visit: Payer: Self-pay

## 2022-03-14 ENCOUNTER — Other Ambulatory Visit (HOSPITAL_COMMUNITY): Payer: Self-pay

## 2022-03-17 ENCOUNTER — Other Ambulatory Visit: Payer: Self-pay

## 2022-03-19 ENCOUNTER — Other Ambulatory Visit: Payer: Self-pay | Admitting: Physician Assistant

## 2022-03-19 DIAGNOSIS — C8300 Small cell B-cell lymphoma, unspecified site: Secondary | ICD-10-CM

## 2022-03-20 NOTE — Progress Notes (Unsigned)
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      PRIOR THERAPY: 1) A tapered dose of prednisone but his anemia and thrombocytopenia were refractory to the steroids.   2) Systemic treatment with obinutuzumab every 4 weeks for 6 months.  First dose starting 09/07/21. Completed 01/26/22    CURRENT THERAPY: venetoclax initially with a starting dose to be increased gradually to the standard dose of 400 mg p.o. daily for a total of 1 year. started 09/28/21    INTERVAL HISTORY: Joseph Moyer 82 y.o. male returns to the clinic today for a follow-up visit.  The patient was found to have progressive small lymphocytic lymphoma about 6 months ago started treatment with obinutuzumab and venetoclax. He completed his 6 months of Obinutuzmab on 01/26/22. He tolerated this well.  He is currently on 400 mg of venetoclax and tolerating well.  Today, the patient denies any new concerns except for a pruritic rash on his right lateral chest and dry skin on his legs bilaterally. He denies new lotions, medications, soaps, or detergents. The patient denies any abnormal bleeding or bruising. His energy is good. He denies shortness of breath.  He denies any fever, chills, or night sweats.  Denies any lymphadenopathy.  Denies any nausea, vomiting, diarrhea, constipation, abdominal pain, abdominal fullness, early satiety.  Denies any signs or symptoms of infection including sore throat, nasal congestion, or dysuria. Denies peripheral neuropathy.  He is here today for evaluation and repeat blood work.   MEDICAL HISTORY: Past Medical History:  Diagnosis Date   Allergy    Arthritis    CAD in native artery    COPD (chronic obstructive pulmonary disease) (HCC)    Frequent PVCs     GERD (gastroesophageal reflux disease)    Hyperlipidemia    Hypertension    Mild carotid artery disease (HCC)    Small lymphocytic lymphoma (HCC)     ALLERGIES:  is allergic to obinutuzumab, oxycodone, ramipril, and rosuvastatin.  MEDICATIONS:  Current Outpatient Medications  Medication Sig Dispense Refill   triamcinolone cream (KENALOG) 0.1 % Apply 1 Application topically 2 (two) times daily. 45 g 0   allopurinol (ZYLOPRIM) 300 MG tablet Take 1 tablet (300 mg total) by mouth 2 (two) times daily. 60 tablet 0   amLODipine (NORVASC) 5 MG tablet Take 5 mg by mouth daily.     atorvastatin (LIPITOR) 40 MG tablet Take 1 tablet (40 mg total) by mouth daily. 90 tablet 1   diphenhydrAMINE HCl, Sleep, 50 MG CAPS Take 50 mg by mouth at bedtime.     FeFum-FePoly-FA-B Cmp-C-Biot (INTEGRA PLUS) CAPS Take 1 capsule by mouth daily. 30 capsule 3   finasteride (PROSCAR) 5 MG tablet Take 5 mg by mouth daily.     lidocaine-prilocaine (EMLA) cream Apply 1 Application topically as needed. 1-2 hours prior to treatment, apply a tsp of the numbing cream to the skin over the port site and cover with cling wrap or other wrap. Do not rub in the cream. Do not use on the area until the port site is healed . 30 g 0   losartan (COZAAR) 25 MG tablet Take 1 tablet (25 mg total) by mouth daily. 90 tablet 1   meloxicam (MOBIC) 15 MG tablet Take 15 mg by mouth daily as  needed.     methocarbamol (ROBAXIN) 750 MG tablet Take 750 mg by mouth every 6 (six) hours as needed.     metoprolol succinate (TOPROL-XL) 25 MG 24 hr tablet TAKE 1 TABLET (25 MG TOTAL) BY MOUTH DAILY. 90 tablet 3   Naphazoline-Glycerin (CLEAR EYES REDNESS RELIEF OP) Place 1 drop into both eyes as needed.     omeprazole (PRILOSEC) 20 MG capsule TAKE 1 CAPSULE BY MOUTH EVERY DAY 90 capsule 0   prochlorperazine (COMPAZINE) 10 MG tablet TAKE 1 TABLET BY MOUTH EVERY 6 HOURS AS NEEDED FOR NAUSEA OR VOMITING. 30 tablet 0   umeclidinium bromide (INCRUSE ELLIPTA)  62.5 MCG/INH AEPB Inhale 1 puff into the lungs daily. 60 each 5   venetoclax (VENCLEXTA) 100 MG tablet Take 4 tablets (400 mg total) by mouth daily. Tablets should be swallowed whole with a meal and a full glass of water. 120 tablet 3   No current facility-administered medications for this visit.    SURGICAL HISTORY:  Past Surgical History:  Procedure Laterality Date   BRONCHIAL NEEDLE ASPIRATION BIOPSY  03/12/2020   Procedure: BRONCHIAL NEEDLE ASPIRATION BIOPSIES;  Surgeon: Garner Nash, DO;  Location: [facility];  Service: Pulmonary;;   CIRCUMCISION     COLONOSCOPY WITH PROPOFOL N/A 05/10/2015   Procedure: COLONOSCOPY WITH PROPOFOL;  Surgeon: Garlan Fair, MD;  Location: [facility];  Service: Endoscopy;  Laterality: N/A;   DECOMPRESSIVE LUMBAR LAMINECTOMY LEVEL 1 N/A 06/15/2018   Procedure: Gordy Levan decompression L1-L2/ complete inferior facetectomyof L1. Posterior lateral arthrodesiswith autograft bone L1-L2.;  Surgeon: Melina Schools, MD;  Location: [facility];  Service: Orthopedics;  Laterality: N/A;   HEMORRHOID SURGERY     IR IMAGING GUIDED PORT INSERTION  09/29/2021   VIDEO BRONCHOSCOPY WITH ENDOBRONCHIAL ULTRASOUND N/A 03/12/2020   Procedure: VIDEO BRONCHOSCOPY WITH ENDOBRONCHIAL ULTRASOUND;  Surgeon: Garner Nash, DO;  Location: [facility];  Service: Pulmonary;  Laterality: N/A;    REVIEW OF SYSTEMS:   Review of Systems  Constitutional: Negative for appetite change, chills, fatigue, fever and unexpected weight change.  HENT:   Negative for mouth sores, nosebleeds, sore throat and trouble swallowing.   Eyes: Negative for eye problems and icterus.  Respiratory: Negative for cough, hemoptysis, shortness of breath and wheezing.   Cardiovascular: Negative for chest pain and leg swelling.  Gastrointestinal: Negative for abdominal pain, constipation, diarrhea, nausea and vomiting.  Genitourinary: Negative for bladder incontinence, difficulty urinating, dysuria, frequency and  hematuria.   Musculoskeletal: Negative for back pain, gait problem, neck pain and neck stiffness.  Skin: Positive for dry skin and rash on anterior chest wall.  Neurological: Negative for dizziness, extremity weakness, gait problem, headaches, light-headedness and seizures.  Hematological: Negative for adenopathy. Does not bruise/bleed easily.  Psychiatric/Behavioral: Negative for confusion, depression and sleep disturbance. The patient is not nervous/anxious.     PHYSICAL EXAMINATION:  Blood pressure (!) 145/71, pulse 74, temperature 98.5 F (36.9 C), temperature source Tympanic, resp. rate 19, weight 218 lb 3.2 oz (99 kg), SpO2 99 %.  ECOG PERFORMANCE STATUS: 1  Physical Exam  Constitutional: Oriented to person, place, and time and well-developed, well-nourished, and in no distress.  HENT:  Head: Normocephalic and atraumatic.  Mouth/Throat: Oropharynx is clear and moist. No oropharyngeal exudate.  Eyes: Conjunctivae are normal. Right eye exhibits no discharge. Left eye exhibits no discharge. No scleral icterus.  Neck: Normal range of motion. Neck supple.  Cardiovascular: Normal rate, regular rhythm, normal heart sounds and intact distal pulses.   Pulmonary/Chest: Effort  normal and breath sounds normal. No respiratory distress. No wheezes. No rales.  Abdominal: Soft. Bowel sounds are normal. Exhibits no distension and no mass. There is no tenderness.  Musculoskeletal: Normal range of motion. Exhibits no edema.  Lymphadenopathy:    No cervical adenopathy.  Neurological: Alert and oriented to person, place, and time. Exhibits normal muscle tone. Gait normal. Coordination normal.  Skin: Skin is warm and dry. Scaly well circumscribed rash/patch on anterior chest.  Not diaphoretic. No erythema. No pallor.  Psychiatric: Mood, memory and judgment normal.  Vitals reviewed.  LABORATORY DATA: Lab Results  Component Value Date   WBC 5.7 03/22/2022   HGB 12.9 (L) 03/22/2022   HCT 38.8 (L)  03/22/2022   MCV 82.7 03/22/2022   PLT 181 03/22/2022      Chemistry      Component Value Date/Time   NA 139 02/22/2022 1425   NA 139 12/05/2021 1007   K 3.9 02/22/2022 1425   CL 104 02/22/2022 1425   CO2 27 02/22/2022 1425   BUN 17 02/22/2022 1425   BUN 16 12/05/2021 1007   CREATININE 1.01 02/22/2022 1425      Component Value Date/Time   CALCIUM 9.0 02/22/2022 1425   ALKPHOS 64 02/22/2022 1425   AST 21 02/22/2022 1425   ALT 20 02/22/2022 1425   BILITOT 0.3 02/22/2022 1425       RADIOGRAPHIC STUDIES:  CT Soft Tissue Neck W Contrast  Result Date: 02/22/2022 CLINICAL DATA:  Hematologic malignancy.  Leukemia. EXAM: CT NECK WITH CONTRAST TECHNIQUE: Multidetector CT imaging of the neck was performed using the standard protocol following the bolus administration of intravenous contrast. RADIATION DOSE REDUCTION: This exam was performed according to the departmental dose-optimization program which includes automated exposure control, adjustment of the mA and/or kV according to patient size and/or use of iterative reconstruction technique. CONTRAST:  136m OMNIPAQUE IOHEXOL 300 MG/ML  SOLN COMPARISON:  PET-CT 03/08/2020, cervical spine MRI 05/19/2015 FINDINGS: Pharynx and larynx: The nasal cavity and nasopharynx are unremarkable. The oral cavity and oropharynx are unremarkable. The parapharyngeal spaces are clear. The hypopharynx and larynx are unremarkable. The vocal folds are normal in appearance. There is no retropharyngeal fluid collection.  The airway is patent. Salivary glands: The parotid and submandibular glands are unremarkable. Thyroid: The thyroid itself is unremarkable. Along the left superolateral margin of the thyroid, there is a 4.8 cm x 2.6 cm by 3.7 cm soft tissue density structure which displaces the internal jugular vein posterolaterally. This structure abuts the thyroid cartilage without evidence of destruction. This structure was likely present on the prior PET-CT from  2022 and did not demonstrate FDG avidity. This structure was also likely present on this MRI cervical spine from 2017 and appeared cystic on that study. Lymph nodes: Previously seen enlarged lymph nodes in the bilateral neck on the PET-CT from 03/08/2020 are overall significantly decreased in size. Residual lymph nodes are as follows: *4 mm right level IIB node, decreased from 8 mm (11-63). *7 mm right level IV node, decreased from 11 mm (11-84). *4 mm bilateral level IB lymph nodes, decreased in size from 8 mm (11-65). *6 mm left level IIA node, decreased in size from 8 mm (11-65). *5 mm left posterior triangle lymph node, decreased from 11 mm (11-69). *5 mm left level 3 node, decreased from 10 mm (11-74). *There are no new or enlarging lymph nodes in the neck. Vascular: There is mild calcified plaque of the carotid bifurcations. Right chest wall port is noted,  incompletely imaged. Limited intracranial: The imaged portions of the intracranial compartment are unremarkable. Visualized orbits: The globes and orbits are unremarkable. Mastoids and visualized paranasal sinuses: Clear. Skeleton: There is no acute osseous abnormality or suspicious osseous lesion. Upper chest: Assessed on the separately dictated CT chest. Other: None. IMPRESSION: 1. Overall significant interval decrease in size of the previously seen enlarged lymph nodes in the bilateral neck on the PET-CT from 03/08/2020 consistent with treatment response. No new or enlarging lymph nodes. 2. 4.8 cm x 2.6 cm x 3.7 cm soft tissue density structure along the left superolateral margin of the thyroid which displaces the internal jugular vein posterolaterally. This structure was likely present on the prior PET-CT from 2022 without FDG avidity, and appeared cystic on this MRI cervical spine from 2017. This is favored to be benign and may arise from the thyroid, or possibly reflect a lymphatic malformation. Recommend attnetion on follow up studies. Target  ultrasound may also be considered to evaluate for solid or cystic components. Electronically Signed   By: Valetta Mole M.D.   On: 02/22/2022 13:13   CT CHEST ABDOMEN PELVIS W CONTRAST  Result Date: 02/22/2022 CLINICAL DATA:  82 year old male with history of CLL. Follow-up study. * Tracking Code: BO * EXAM: CT CHEST, ABDOMEN, AND PELVIS WITH CONTRAST TECHNIQUE: Multidetector CT imaging of the chest, abdomen and pelvis was performed following the standard protocol during bolus administration of intravenous contrast. RADIATION DOSE REDUCTION: This exam was performed according to the departmental dose-optimization program which includes automated exposure control, adjustment of the mA and/or kV according to patient size and/or use of iterative reconstruction technique. CONTRAST:  152m OMNIPAQUE IOHEXOL 300 MG/ML  SOLN COMPARISON:  CT of the chest, abdomen and pelvis 07/21/2021. FINDINGS: CT CHEST FINDINGS Cardiovascular: Heart size is normal. There is no significant pericardial fluid, thickening or pericardial calcification. There is aortic atherosclerosis, as well as atherosclerosis of the great vessels of the mediastinum and the coronary arteries, including calcified atherosclerotic plaque in the left main, left anterior descending, left circumflex and right coronary arteries. Calcifications of the aortic valve. Right internal jugular single-lumen Port-A-Cath with tip terminating in the right atrium. Mediastinum/Nodes: Multiple prominent mediastinal lymph nodes have clearly regressed compared to prior examinations, with the largest residual lymph node noted in the subcarinal nodal station measuring 1 cm in short axis (previously 1.7 cm). No hilar lymphadenopathy. Esophagus is unremarkable in appearance. Bilateral axillary lymphadenopathy seen on prior examinations has also regressed, now with prominent but nonenlarged lymph nodes in the axillary regions bilaterally, demonstrating normal-appearing fatty hila.  Lungs/Pleura: There is some mild post infectious or inflammatory scarring in the medial segment of the right middle lobe. No acute consolidative airspace disease. No pleural effusions. No definite suspicious appearing pulmonary nodules or masses are noted. Musculoskeletal: There are no aggressive appearing lytic or blastic lesions noted in the visualized portions of the skeleton. CT ABDOMEN PELVIS FINDINGS Hepatobiliary: No suspicious cystic or solid hepatic lesions. No intra or extrahepatic biliary ductal dilatation. 1.7 cm calcified gallstone in the lumen of the gallbladder. Gallbladder is not distended. Gallbladder wall thickness is normal. No pericholecystic fluid or surrounding inflammatory changes. Pancreas: No pancreatic mass. No pancreatic ductal dilatation. No pancreatic or peripancreatic fluid collections or inflammatory changes. Spleen: Unremarkable. Adrenals/Urinary Tract: Bilateral kidneys and adrenal glands are unremarkable in appearance. No hydroureteronephrosis. Urinary bladder wall appears diffusely thickened, without discrete bladder wall mass. Stomach/Bowel: The appearance of the stomach is normal. There is no pathologic dilatation of small bowel or colon.  Numerous colonic diverticuli are noted. There is some soft tissue stranding in the region of the sigmoid mesocolon, which could suggest a mild acute diverticulitis. Normal appendix. Vascular/Lymphatic: Aortic atherosclerosis, without evidence of aneurysm or dissection in the abdominal or pelvic vasculature. There are several prominent but nonenlarged retroperitoneal lymph nodes, all of which appear decreased in size compared to prior examinations. No new lymphadenopathy is noted elsewhere in the abdomen or pelvis. Reproductive: Median lobe hypertrophy in the prostate gland. Other: No significant volume of ascites.  No pneumoperitoneum. Musculoskeletal: Status post PLIF at L1-L2. There are no aggressive appearing lytic or blastic lesions noted in  the visualized portions of the skeleton. IMPRESSION: 1. Today's study demonstrates a positive response to therapy with regression of previously noted lymphadenopathy in the chest, abdomen and pelvis, as above. No new lymphadenopathy is noted. 2. Colonic diverticulosis with subtle inflammatory changes adjacent to the sigmoid colon. Findings are concerning for early or mild acute diverticulitis. Clinical evaluation is suggested. No diverticular abscess or signs of frank perforation are noted at this time. 3. In addition, there is mild circumferential bladder wall thickening, without a discrete bladder wall mass. Clinical correlation for signs and symptoms of cystitis is suggested. 4. Cholelithiasis without evidence of acute cholecystitis. 5. Aortic atherosclerosis, in addition to left main and three-vessel coronary artery disease. Assessment for potential risk factor modification, dietary therapy or pharmacologic therapy may be warranted, if clinically indicated. 6. There are calcifications of the aortic valve. Echocardiographic correlation for evaluation of potential valvular dysfunction may be warranted if clinically indicated. 7. Additional incidental findings, as above. Electronically Signed   By: Vinnie Langton M.D.   On: 02/22/2022 10:22     ASSESSMENT/PLAN:  This is a very pleasant 82 years old African-American male diagnosed with a small lymphocytic lymphoma with generalized lymphadenopathy in the neck, chest, axilla, abdomen and pelvis. The core biopsy of the left inguinal lymph node was consistent with the above diagnosis.   Patient recently developed worsening anemia and thrombocytopenia. His imaging studies showed mildly progressive lymphadenopathy in the right axillary, porta hepatis, retroperitoneum, and pelvis.  The patient received a tapering dose of prednisone without any improvement in his thrombocytopenia.    Dr. Julien Nordmann recommended starting treatment with  obinutuzumab for 6 months and  venetoclax for the duration of 1 year.     He is status post 6 cycles of obinutuzumab. 6 months of treatment was completed on 01/26/22. He is currently on venetoclax 400 mg daily. He started his first dose of venetoclax on 09/28/21.    Labs were reviewed. He continue on venetoclax 400 mg daily.    We will see the patient back for follow-up visit in 4 weeks for evaluation and repeat blood work. His CMP is pending. If there are any concerning findings, then I will call for recommendations.    For his legs, recommended that he use lotion for his dry skin. For his rash, I will send him some kenalog cream for the itching.   He has a port. We will make sure his labs are drawn off his port-a-cath at his future appointments.   The patient was advised to call immediately if he has any concerning symptoms in the interval. The patient voices understanding of current disease status and treatment options and is in agreement with the current care plan. All questions were answered. The patient knows to call the clinic with any problems, questions or concerns. We can certainly see the patient much sooner if necessary  No orders of the defined types were placed in this encounter.    The total time spent in the appointment was 20-29 minutes. Tobe Sos Raushanah Osmundson, PA-C 03/22/22

## 2022-03-22 ENCOUNTER — Other Ambulatory Visit: Payer: Self-pay

## 2022-03-22 ENCOUNTER — Inpatient Hospital Stay: Payer: Medicare HMO | Admitting: Physician Assistant

## 2022-03-22 ENCOUNTER — Inpatient Hospital Stay: Payer: Medicare HMO | Attending: Physician Assistant

## 2022-03-22 VITALS — BP 145/71 | HR 74 | Temp 98.5°F | Resp 19 | Wt 218.2 lb

## 2022-03-22 DIAGNOSIS — C8308 Small cell B-cell lymphoma, lymph nodes of multiple sites: Secondary | ICD-10-CM | POA: Insufficient documentation

## 2022-03-22 DIAGNOSIS — C8300 Small cell B-cell lymphoma, unspecified site: Secondary | ICD-10-CM

## 2022-03-22 DIAGNOSIS — R21 Rash and other nonspecific skin eruption: Secondary | ICD-10-CM | POA: Diagnosis not present

## 2022-03-22 DIAGNOSIS — D649 Anemia, unspecified: Secondary | ICD-10-CM | POA: Diagnosis not present

## 2022-03-22 DIAGNOSIS — D696 Thrombocytopenia, unspecified: Secondary | ICD-10-CM | POA: Diagnosis not present

## 2022-03-22 DIAGNOSIS — R300 Dysuria: Secondary | ICD-10-CM

## 2022-03-22 LAB — CMP (CANCER CENTER ONLY)
ALT: 29 U/L (ref 0–44)
AST: 24 U/L (ref 15–41)
Albumin: 4.2 g/dL (ref 3.5–5.0)
Alkaline Phosphatase: 51 U/L (ref 38–126)
Anion gap: 8 (ref 5–15)
BUN: 18 mg/dL (ref 8–23)
CO2: 26 mmol/L (ref 22–32)
Calcium: 9.2 mg/dL (ref 8.9–10.3)
Chloride: 106 mmol/L (ref 98–111)
Creatinine: 1.04 mg/dL (ref 0.61–1.24)
GFR, Estimated: >60 mL/min (ref >60)
Glucose, Bld: 118 mg/dL — ABNORMAL HIGH (ref 70–99)
Potassium: 4 mmol/L (ref 3.5–5.1)
Sodium: 140 mmol/L (ref 135–145)
Total Bilirubin: 0.5 mg/dL (ref 0.3–1.2)
Total Protein: 7 g/dL (ref 6.5–8.1)

## 2022-03-22 LAB — CBC WITH DIFFERENTIAL (CANCER CENTER ONLY)
Abs Immature Granulocytes: 0.02 10*3/uL (ref 0.00–0.07)
Basophils Absolute: 0 10*3/uL (ref 0.0–0.1)
Basophils Relative: 0 %
Eosinophils Absolute: 0.1 10*3/uL (ref 0.0–0.5)
Eosinophils Relative: 2 %
HCT: 38.8 % — ABNORMAL LOW (ref 39.0–52.0)
Hemoglobin: 12.9 g/dL — ABNORMAL LOW (ref 13.0–17.0)
Immature Granulocytes: 0 %
Lymphocytes Relative: 49 %
Lymphs Abs: 2.7 10*3/uL (ref 0.7–4.0)
MCH: 27.5 pg (ref 26.0–34.0)
MCHC: 33.2 g/dL (ref 30.0–36.0)
MCV: 82.7 fL (ref 80.0–100.0)
Monocytes Absolute: 0.5 10*3/uL (ref 0.1–1.0)
Monocytes Relative: 9 %
Neutro Abs: 2.3 10*3/uL (ref 1.7–7.7)
Neutrophils Relative %: 40 %
Platelet Count: 181 10*3/uL (ref 150–400)
RBC: 4.69 MIL/uL (ref 4.22–5.81)
RDW: 15.9 % — ABNORMAL HIGH (ref 11.5–15.5)
WBC Count: 5.7 10*3/uL (ref 4.0–10.5)
nRBC: 0 % (ref 0.0–0.2)

## 2022-03-22 LAB — LACTATE DEHYDROGENASE: LDH: 216 U/L — ABNORMAL HIGH (ref 98–192)

## 2022-03-22 MED ORDER — TRIAMCINOLONE ACETONIDE 0.1 % EX CREA: 1.0000 | TOPICAL_CREAM | Freq: Two times a day (BID) | CUTANEOUS | 0 refills | Status: DC

## 2022-03-23 ENCOUNTER — Other Ambulatory Visit: Payer: Self-pay

## 2022-03-24 ENCOUNTER — Other Ambulatory Visit: Payer: Self-pay

## 2022-04-01 ENCOUNTER — Other Ambulatory Visit: Payer: Self-pay | Admitting: Internal Medicine

## 2022-04-02 ENCOUNTER — Other Ambulatory Visit: Payer: Self-pay | Admitting: Physician Assistant

## 2022-04-03 ENCOUNTER — Telehealth: Payer: Self-pay | Admitting: Internal Medicine

## 2022-04-03 NOTE — Telephone Encounter (Signed)
Called patient regarding March appointments, patient is notified.

## 2022-04-13 ENCOUNTER — Other Ambulatory Visit (HOSPITAL_COMMUNITY): Payer: Self-pay

## 2022-04-15 NOTE — Progress Notes (Unsigned)
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      PRIOR THERAPY:  1) A tapered dose of prednisone but his anemia and thrombocytopenia were refractory to the steroids.   2) Systemic treatment with obinutuzumab every 4 weeks for 6 months.  First dose starting 09/07/21. Completed 01/26/22  CURRENT THERAPY: venetoclax initially with a starting dose to be increased gradually to the standard dose of 400 mg p.o. daily for a total of 1 year. started 09/28/21    INTERVAL HISTORY: Joseph Moyer 82 y.o. male returns to the clinic today for a follow-up visit.  The patient was found to have progressive small lymphocytic lymphoma in August 2023 and started treatment with obinutuzumab and venetoclax. He completed his 6 months of Obinutuzmab on 01/26/22. He tolerated this well.  He is currently on 400 mg of venetoclax and tolerating well.  Today, the patient denies any new concerns. At his last appointment he had a pruritic rash on his right lateral chest and dry skin on his legs bilaterally and was given kenalog cream which helped somewhat and he states it is not as bad as it was. He denies new lotions, medications, soaps, or detergents. The patient denies any abnormal bleeding or bruising. His energy is good. He denies shortness of breath.  He denies any fever, chills, or night sweats.  Denies any lymphadenopathy.  Denies any nausea, vomiting, diarrhea, constipation, abdominal pain, abdominal fullness, early satiety.  Denies any signs or symptoms of infection including sore throat, nasal congestion, or dysuria. Denies peripheral neuropathy.  He is here today for evaluation and repeat blood work.   MEDICAL HISTORY: Past Medical History:  Diagnosis Date   Allergy     Arthritis    CAD in native artery    COPD (chronic obstructive pulmonary disease) (HCC)    Frequent PVCs    GERD (gastroesophageal reflux disease)    Hyperlipidemia    Hypertension    Mild carotid artery disease (HCC)    Small lymphocytic lymphoma (HCC)     ALLERGIES:  is allergic to obinutuzumab, oxycodone, ramipril, and rosuvastatin.  MEDICATIONS:  Current Outpatient Medications  Medication Sig Dispense Refill   allopurinol (ZYLOPRIM) 300 MG tablet Take 1 tablet (300 mg total) by mouth 2 (two) times daily. 60 tablet 0   amLODipine (NORVASC) 5 MG tablet Take 5 mg by mouth daily.     atorvastatin (LIPITOR) 40 MG tablet TAKE 1 TABLET BY MOUTH EVERY DAY 90 tablet 2   diphenhydrAMINE HCl, Sleep, 50 MG CAPS Take 50 mg by mouth at bedtime.     FeFum-FePoly-FA-B Cmp-C-Biot (INTEGRA PLUS) CAPS Take 1 capsule by mouth daily. 30 capsule 3   finasteride (PROSCAR) 5 MG tablet Take 5 mg by mouth daily.     lidocaine-prilocaine (EMLA) cream Apply 1 Application topically as needed. 1-2 hours prior to treatment, apply a tsp of the numbing cream to the skin over the port site and cover with cling wrap or other wrap. Do not rub in the cream. Do not use on the area until the port site is healed . 30 g 0   losartan (COZAAR) 25 MG tablet Take 1 tablet (25 mg total) by mouth daily. 90 tablet 1   meloxicam (MOBIC) 15 MG tablet Take 15 mg by mouth  daily as needed.     methocarbamol (ROBAXIN) 750 MG tablet Take 750 mg by mouth every 6 (six) hours as needed.     metoprolol succinate (TOPROL-XL) 25 MG 24 hr tablet TAKE 1 TABLET (25 MG TOTAL) BY MOUTH DAILY. 90 tablet 3   Naphazoline-Glycerin (CLEAR EYES REDNESS RELIEF OP) Place 1 drop into both eyes as needed.     omeprazole (PRILOSEC) 20 MG capsule TAKE 1 CAPSULE BY MOUTH EVERY DAY 90 capsule 0   prochlorperazine (COMPAZINE) 10 MG tablet TAKE 1 TABLET BY MOUTH EVERY 6 HOURS AS NEEDED FOR NAUSEA OR VOMITING. 30 tablet 0   triamcinolone cream (KENALOG) 0.1 %  Apply 1 Application topically 2 (two) times daily. 45 g 0   umeclidinium bromide (INCRUSE ELLIPTA) 62.5 MCG/INH AEPB Inhale 1 puff into the lungs daily. 60 each 5   venetoclax (VENCLEXTA) 100 MG tablet Take 4 tablets (400 mg total) by mouth daily. Tablets should be swallowed whole with a meal and a full glass of water. 120 tablet 3   No current facility-administered medications for this visit.    SURGICAL HISTORY:  Past Surgical History:  Procedure Laterality Date   BRONCHIAL NEEDLE ASPIRATION BIOPSY  03/12/2020   Procedure: BRONCHIAL NEEDLE ASPIRATION BIOPSIES;  Surgeon: Garner Nash, DO;  Location: [facility];  Service: Pulmonary;;   CIRCUMCISION     COLONOSCOPY WITH PROPOFOL N/A 05/10/2015   Procedure: COLONOSCOPY WITH PROPOFOL;  Surgeon: Garlan Fair, MD;  Location: [facility];  Service: Endoscopy;  Laterality: N/A;   DECOMPRESSIVE LUMBAR LAMINECTOMY LEVEL 1 N/A 06/15/2018   Procedure: Gordy Levan decompression L1-L2/ complete inferior facetectomyof L1. Posterior lateral arthrodesiswith autograft bone L1-L2.;  Surgeon: Melina Schools, MD;  Location: [facility];  Service: Orthopedics;  Laterality: N/A;   HEMORRHOID SURGERY     IR IMAGING GUIDED PORT INSERTION  09/29/2021   VIDEO BRONCHOSCOPY WITH ENDOBRONCHIAL ULTRASOUND N/A 03/12/2020   Procedure: VIDEO BRONCHOSCOPY WITH ENDOBRONCHIAL ULTRASOUND;  Surgeon: Garner Nash, DO;  Location: [facility];  Service: Pulmonary;  Laterality: N/A;    REVIEW OF SYSTEMS:   Review of Systems  Constitutional: Negative for appetite change, chills, fatigue, fever and unexpected weight change.  HENT:   Negative for mouth sores, nosebleeds, sore throat and trouble swallowing.   Eyes: Negative for eye problems and icterus.  Respiratory: Negative for cough, hemoptysis, shortness of breath and wheezing.   Cardiovascular: Negative for chest pain and leg swelling.  Gastrointestinal: Negative for abdominal pain, constipation, diarrhea, nausea and vomiting.   Genitourinary: Negative for bladder incontinence, difficulty urinating, dysuria, frequency and hematuria.   Musculoskeletal: Negative for back pain, gait problem, neck pain and neck stiffness.  Skin: Positive for dry skin and rash on anterior chest wall.  Neurological: Negative for dizziness, extremity weakness, gait problem, headaches, light-headedness and seizures.  Hematological: Negative for adenopathy. Does not bruise/bleed easily.  Psychiatric/Behavioral: Negative for confusion, depression and sleep disturbance. The patient is not nervous/anxious.     PHYSICAL EXAMINATION:  Blood pressure (!) 143/74, pulse 72, temperature 98 F (36.7 C), temperature source Temporal, resp. rate 17, height 5\' 11"  (1.803 m), weight 220 lb 12.8 oz (100.2 kg), SpO2 100 %.  ECOG PERFORMANCE STATUS: 0  Physical Exam  Constitutional: Oriented to person, place, and time and well-developed, well-nourished, and in no distress.  HENT:  Head: Normocephalic and atraumatic.  Mouth/Throat: Oropharynx is clear and moist. No oropharyngeal exudate.  Eyes: Conjunctivae are normal. Right eye exhibits no discharge. Left eye exhibits no discharge. No scleral icterus.  Neck: Normal range of motion. Neck supple.  Cardiovascular: Normal rate, regular rhythm, normal heart sounds and intact distal pulses.   Pulmonary/Chest: Effort normal and breath sounds normal. No respiratory distress. No wheezes. No rales.  Abdominal: Soft. Bowel sounds are normal. Exhibits no distension and no mass. There is no tenderness.  Musculoskeletal: Normal range of motion. Exhibits no edema.  Lymphadenopathy:    No cervical adenopathy.  Neurological: Alert and oriented to person, place, and time. Exhibits normal muscle tone. Gait normal. Coordination normal.  Skin: Skin is warm and dry. Scaly well circumscribed rash/patch on anterior chest.  Not diaphoretic. No erythema. No pallor.  Psychiatric: Mood, memory and judgment normal.  Vitals  reviewed.  LABORATORY DATA: Lab Results  Component Value Date   WBC 5.1 04/19/2022   HGB 12.7 (L) 04/19/2022   HCT 37.6 (L) 04/19/2022   MCV 83.0 04/19/2022   PLT 239 04/19/2022      Chemistry      Component Value Date/Time   NA 138 04/19/2022 1454   NA 139 12/05/2021 1007   K 3.5 04/19/2022 1454   CL 104 04/19/2022 1454   CO2 26 04/19/2022 1454   BUN 16 04/19/2022 1454   BUN 16 12/05/2021 1007   CREATININE 0.77 04/19/2022 1454      Component Value Date/Time   CALCIUM 9.0 04/19/2022 1454   ALKPHOS 59 04/19/2022 1454   AST 19 04/19/2022 1454   ALT 20 04/19/2022 1454   BILITOT 0.8 04/19/2022 1454       RADIOGRAPHIC STUDIES:  No results found.   ASSESSMENT/PLAN:  This is a very pleasant 82 years old African-American male diagnosed with a small lymphocytic lymphoma with generalized lymphadenopathy in the neck, chest, axilla, abdomen and pelvis. The core biopsy of the left inguinal lymph node was consistent with the above diagnosis.   Patient recently developed worsening anemia and thrombocytopenia. His imaging studies showed mildly progressive lymphadenopathy in the right axillary, porta hepatis, retroperitoneum, and pelvis.   The patient received a tapering dose of prednisone without any improvement in his thrombocytopenia.    Dr. Julien Nordmann recommended starting treatment with  obinutuzumab for 6 months and venetoclax for the duration of 1 year.      He is status post 6 cycles of obinutuzumab. 6 months of treatment was completed on 01/26/22. He is currently on venetoclax 400 mg daily. He started his first dose of venetoclax on 09/28/21.     The patient was seen with Dr. Julien Nordmann. Labs were reviewed. Recommend that he continue on venetoclax 400 mg daily  We will see the patient back for follow-up visit in 4 weeks for evaluation and repeat blood work. His CMP is pending. If there are any concerning findings, then I will call for recommendations.     He has a port. We will  make sure his labs are drawn off his port-a-cath at his future appointments.   The patient was advised to call immediately if he has any concerning symptoms in the interval. The patient voices understanding of current disease status and treatment options and is in agreement with the current care plan. All questions were answered. The patient knows to call the clinic with any problems, questions or concerns. We can certainly see the patient much sooner if necessary     Orders Placed This Encounter  Procedures   CBC with Differential ([facility] Only)    Standing Status:   Future    Standing Expiration Date:   04/19/2023   CMP (  [facility] only)    Standing Status:   Future    Standing Expiration Date:   04/19/2023   Lactate dehydrogenase (LDH)    Standing Status:   Future    Standing Expiration Date:   04/19/2023      Tobe Sos Dorse Locy, PA-C 04/19/22  ADDENDUM: Hematology/Oncology Attending:  I had a face-to-face encounter with the patient today.  I reviewed his records, lab and recommended his care plan.  This is a very pleasant 82 years old African-American male with a small lymphocytic lymphoma with generalized lymphadenopathy in the neck, mediastinum, axilla, abdomen as well as inguinal area with severe anemia and thrombocytopenia diagnosed initially in February 2022.  His condition required treatment and August 2023 started treatment with obinutuzumab as well as venetoclax.  He completed 6 cycles of the obinutuzumab infusion every 4 weeks.  He is currently on venetoclax 400 mg p.o. daily and expected to complete this course of treatment in August 2024. The patient has been tolerating his treatment well with no concerning adverse effects. Repeat CBC today showed mild anemia but normal total white blood count and platelets count.  Comprehensive metabolic panel is unremarkable except for the mildly elevated blood glucose. I recommended for the patient to continue his current  treatment with venetoclax with the same dose. He will come back for follow-up visit in 1 months for evaluation and repeat blood work. He was advised to call immediately if he has any other concerning symptoms in the interval.  Disclaimer: This note was dictated with voice recognition software. Similar sounding words can inadvertently be transcribed and may be missed upon review. Eilleen Kempf, MD

## 2022-04-17 ENCOUNTER — Other Ambulatory Visit (HOSPITAL_COMMUNITY): Payer: Self-pay

## 2022-04-18 ENCOUNTER — Other Ambulatory Visit (HOSPITAL_COMMUNITY): Payer: Self-pay

## 2022-04-19 ENCOUNTER — Inpatient Hospital Stay: Payer: Medicare HMO | Attending: Physician Assistant

## 2022-04-19 ENCOUNTER — Telehealth: Payer: Self-pay

## 2022-04-19 ENCOUNTER — Inpatient Hospital Stay (HOSPITAL_BASED_OUTPATIENT_CLINIC_OR_DEPARTMENT_OTHER): Payer: Medicare HMO | Admitting: Physician Assistant

## 2022-04-19 ENCOUNTER — Other Ambulatory Visit: Payer: Self-pay

## 2022-04-19 VITALS — BP 143/74 | HR 72 | Temp 98.0°F | Resp 17 | Ht 71.0 in | Wt 220.8 lb

## 2022-04-19 DIAGNOSIS — C8300 Small cell B-cell lymphoma, unspecified site: Secondary | ICD-10-CM | POA: Diagnosis not present

## 2022-04-19 DIAGNOSIS — C8308 Small cell B-cell lymphoma, lymph nodes of multiple sites: Secondary | ICD-10-CM | POA: Diagnosis not present

## 2022-04-19 DIAGNOSIS — D696 Thrombocytopenia, unspecified: Secondary | ICD-10-CM | POA: Insufficient documentation

## 2022-04-19 DIAGNOSIS — T451X5A Adverse effect of antineoplastic and immunosuppressive drugs, initial encounter: Secondary | ICD-10-CM

## 2022-04-19 DIAGNOSIS — D649 Anemia, unspecified: Secondary | ICD-10-CM | POA: Insufficient documentation

## 2022-04-19 DIAGNOSIS — Z95828 Presence of other vascular implants and grafts: Secondary | ICD-10-CM

## 2022-04-19 LAB — CMP (CANCER CENTER ONLY)
ALT: 20 U/L (ref 0–44)
AST: 19 U/L (ref 15–41)
Albumin: 4.4 g/dL (ref 3.5–5.0)
Alkaline Phosphatase: 59 U/L (ref 38–126)
Anion gap: 8 (ref 5–15)
BUN: 16 mg/dL (ref 8–23)
CO2: 26 mmol/L (ref 22–32)
Calcium: 9 mg/dL (ref 8.9–10.3)
Chloride: 104 mmol/L (ref 98–111)
Creatinine: 0.77 mg/dL (ref 0.61–1.24)
GFR, Estimated: >60 mL/min (ref >60)
Glucose, Bld: 120 mg/dL — ABNORMAL HIGH (ref 70–99)
Potassium: 3.5 mmol/L (ref 3.5–5.1)
Sodium: 138 mmol/L (ref 135–145)
Total Bilirubin: 0.8 mg/dL (ref 0.3–1.2)
Total Protein: 7.4 g/dL (ref 6.5–8.1)

## 2022-04-19 LAB — CBC WITH DIFFERENTIAL (CANCER CENTER ONLY)
Abs Immature Granulocytes: 0.02 10*3/uL (ref 0.00–0.07)
Basophils Absolute: 0 10*3/uL (ref 0.0–0.1)
Basophils Relative: 0 %
Eosinophils Absolute: 0.1 10*3/uL (ref 0.0–0.5)
Eosinophils Relative: 2 %
HCT: 37.6 % — ABNORMAL LOW (ref 39.0–52.0)
Hemoglobin: 12.7 g/dL — ABNORMAL LOW (ref 13.0–17.0)
Immature Granulocytes: 0 %
Lymphocytes Relative: 49 %
Lymphs Abs: 2.5 10*3/uL (ref 0.7–4.0)
MCH: 28 pg (ref 26.0–34.0)
MCHC: 33.8 g/dL (ref 30.0–36.0)
MCV: 83 fL (ref 80.0–100.0)
Monocytes Absolute: 0.6 10*3/uL (ref 0.1–1.0)
Monocytes Relative: 12 %
Neutro Abs: 1.9 10*3/uL (ref 1.7–7.7)
Neutrophils Relative %: 37 %
Platelet Count: 239 10*3/uL (ref 150–400)
RBC: 4.53 MIL/uL (ref 4.22–5.81)
RDW: 16.4 % — ABNORMAL HIGH (ref 11.5–15.5)
WBC Count: 5.1 10*3/uL (ref 4.0–10.5)
nRBC: 0 % (ref 0.0–0.2)

## 2022-04-19 MED ORDER — SODIUM CHLORIDE 0.9% FLUSH
10.0000 mL | Freq: Once | INTRAVENOUS | Status: AC
  Administered 2022-04-19: 10 mL

## 2022-04-19 MED ORDER — HEPARIN SOD (PORK) LOCK FLUSH 100 UNIT/ML IV SOLN
500.0000 [IU] | Freq: Once | INTRAVENOUS | Status: AC
  Administered 2022-04-19: 500 [IU]

## 2022-04-19 NOTE — Patient Instructions (Signed)
Visit Information  Thank you for taking time to visit with me today. Please don't hesitate to contact me if I can be of assistance to you.   Following are the goals we discussed today:   Goals Addressed             This Visit's Progress    COMPLETED: Care Coordination Activities- No Follow Up Required       Interventions Today    Flowsheet Row Most Recent Value  Chronic Disease   Chronic disease during today's visit Hypertension (HTN)  General Interventions   General Interventions Discussed/Reviewed General Interventions Discussed, Doctor Visits, Durable Medical Equipment (DME)  [decllines any needing further RNCM follow up]  Doctor Visits Discussed/Reviewed Doctor Visits Discussed, Annual Wellness Visits, PCP  Durable Medical Equipment (DME) BP Cuff  PCP/Specialist Visits Compliance with follow-up visit  Education Interventions   Education Provided Provided Education  Provided Verbal Education On Other  [caew coordination services]               If you are experiencing a Mental Health or [place] or need someone to talk to, please call the Suicide and Crisis Lifeline: 988 call the [place] National Suicide Prevention Lifeline: 406 874 4786 or TTY: (212)300-9347 TTY (443)643-9574) to talk to a trained counselor call 1-800-273-TALK (toll free, 24 hour hotline) go to [facility] [place] [phone]) call 911   Patient verbalizes understanding of instructions and care plan provided today and agrees to view in [place]. Active MyChart status and patient understanding of how to access instructions and care plan via MyChart confirmed with patient.     No further follow up required:    [place], Jackquline Denmark, [place] Management [phone]

## 2022-04-19 NOTE — Patient Outreach (Signed)
  Care Coordination   Initial Visit Note   04/19/2022 Name: Joseph Moyer MRN: [id number] DOB: Jun 16, 1940  Joseph Moyer is a 82 y.o. year old male who sees [name], MD for primary care. I spoke with  [name] by phone today.  What matters to the patients health and wellness today?  No concerns today    Goals Addressed             This Visit's Progress    COMPLETED: Care Coordination Activities- No Follow Up Required       Interventions Today    Flowsheet Row Most Recent Value  Chronic Disease   Chronic disease during today's visit Hypertension (HTN)  General Interventions   General Interventions Discussed/Reviewed General Interventions Discussed, Doctor Visits, Durable Medical Equipment (DME)  [decllines any needing further RNCM follow up]  Doctor Visits Discussed/Reviewed Doctor Visits Discussed, Annual Wellness Visits, PCP  Durable Medical Equipment (DME) BP Cuff  PCP/Specialist Visits Compliance with follow-up visit  Education Interventions   Education Provided Provided Education  Provided Verbal Education On Other  [caew coordination services]              SDOH assessments and interventions completed:  Yes  SDOH Interventions Today    Flowsheet Row Most Recent Value  SDOH Interventions   Food Insecurity Interventions Intervention Not Indicated  Housing Interventions Intervention Not Indicated  Transportation Interventions Intervention Not Indicated  Utilities Interventions Intervention Not Indicated        Care Coordination Interventions:  Yes, provided   Follow up plan: No further intervention required.   Encounter Outcome:  Pt. Visit Completed  Peter Garter RN, BSN,CCM, CDE Care Management Coordinator [facility] [phone]

## 2022-04-26 DIAGNOSIS — E291 Testicular hypofunction: Secondary | ICD-10-CM | POA: Diagnosis not present

## 2022-04-26 DIAGNOSIS — I10 Essential (primary) hypertension: Secondary | ICD-10-CM | POA: Diagnosis not present

## 2022-04-26 DIAGNOSIS — Z1331 Encounter for screening for depression: Secondary | ICD-10-CM | POA: Diagnosis not present

## 2022-04-26 DIAGNOSIS — Z0000 Encounter for general adult medical examination without abnormal findings: Secondary | ICD-10-CM | POA: Diagnosis not present

## 2022-04-26 DIAGNOSIS — C8300 Small cell B-cell lymphoma, unspecified site: Secondary | ICD-10-CM | POA: Diagnosis not present

## 2022-04-26 DIAGNOSIS — E7800 Pure hypercholesterolemia, unspecified: Secondary | ICD-10-CM | POA: Diagnosis not present

## 2022-04-26 DIAGNOSIS — R739 Hyperglycemia, unspecified: Secondary | ICD-10-CM | POA: Diagnosis not present

## 2022-05-03 ENCOUNTER — Other Ambulatory Visit: Payer: Self-pay | Admitting: Physician Assistant

## 2022-05-03 ENCOUNTER — Other Ambulatory Visit: Payer: Self-pay

## 2022-05-10 DIAGNOSIS — E119 Type 2 diabetes mellitus without complications: Secondary | ICD-10-CM | POA: Diagnosis not present

## 2022-05-10 DIAGNOSIS — C8300 Small cell B-cell lymphoma, unspecified site: Secondary | ICD-10-CM | POA: Diagnosis not present

## 2022-05-11 ENCOUNTER — Other Ambulatory Visit: Payer: Self-pay

## 2022-05-11 ENCOUNTER — Other Ambulatory Visit (HOSPITAL_COMMUNITY): Payer: Self-pay

## 2022-05-24 ENCOUNTER — Inpatient Hospital Stay: Payer: Medicare HMO | Attending: Physician Assistant | Admitting: Internal Medicine

## 2022-05-24 ENCOUNTER — Inpatient Hospital Stay: Payer: Medicare HMO

## 2022-05-24 VITALS — BP 144/70 | HR 68 | Temp 97.7°F | Resp 15 | Wt 219.2 lb

## 2022-05-24 DIAGNOSIS — D72819 Decreased white blood cell count, unspecified: Secondary | ICD-10-CM | POA: Insufficient documentation

## 2022-05-24 DIAGNOSIS — D649 Anemia, unspecified: Secondary | ICD-10-CM | POA: Diagnosis not present

## 2022-05-24 DIAGNOSIS — C8308 Small cell B-cell lymphoma, lymph nodes of multiple sites: Secondary | ICD-10-CM | POA: Insufficient documentation

## 2022-05-24 DIAGNOSIS — D701 Agranulocytosis secondary to cancer chemotherapy: Secondary | ICD-10-CM

## 2022-05-24 DIAGNOSIS — C8300 Small cell B-cell lymphoma, unspecified site: Secondary | ICD-10-CM

## 2022-05-24 DIAGNOSIS — Z95828 Presence of other vascular implants and grafts: Secondary | ICD-10-CM

## 2022-05-24 DIAGNOSIS — Z7969 Long term (current) use of other immunomodulators and immunosuppressants: Secondary | ICD-10-CM | POA: Diagnosis not present

## 2022-05-24 LAB — CMP (CANCER CENTER ONLY)
ALT: 17 U/L (ref 0–44)
AST: 16 U/L (ref 15–41)
Albumin: 4.3 g/dL (ref 3.5–5.0)
Alkaline Phosphatase: 63 U/L (ref 38–126)
Anion gap: 5 (ref 5–15)
BUN: 14 mg/dL (ref 8–23)
CO2: 30 mmol/L (ref 22–32)
Calcium: 9.2 mg/dL (ref 8.9–10.3)
Chloride: 103 mmol/L (ref 98–111)
Creatinine: 0.98 mg/dL (ref 0.61–1.24)
GFR, Estimated: >60 mL/min (ref >60)
Glucose, Bld: 134 mg/dL — ABNORMAL HIGH (ref 70–99)
Potassium: 3.8 mmol/L (ref 3.5–5.1)
Sodium: 138 mmol/L (ref 135–145)
Total Bilirubin: 0.4 mg/dL (ref 0.3–1.2)
Total Protein: 7.3 g/dL (ref 6.5–8.1)

## 2022-05-24 LAB — CBC WITH DIFFERENTIAL (CANCER CENTER ONLY)
Abs Immature Granulocytes: 0.01 10*3/uL (ref 0.00–0.07)
Basophils Absolute: 0 10*3/uL (ref 0.0–0.1)
Basophils Relative: 0 %
Eosinophils Absolute: 0.1 10*3/uL (ref 0.0–0.5)
Eosinophils Relative: 4 %
HCT: 37.5 % — ABNORMAL LOW (ref 39.0–52.0)
Hemoglobin: 12.3 g/dL — ABNORMAL LOW (ref 13.0–17.0)
Immature Granulocytes: 0 %
Lymphocytes Relative: 54 %
Lymphs Abs: 1.8 10*3/uL (ref 0.7–4.0)
MCH: 28 pg (ref 26.0–34.0)
MCHC: 32.8 g/dL (ref 30.0–36.0)
MCV: 85.2 fL (ref 80.0–100.0)
Monocytes Absolute: 0.5 10*3/uL (ref 0.1–1.0)
Monocytes Relative: 17 %
Neutro Abs: 0.8 10*3/uL — ABNORMAL LOW (ref 1.7–7.7)
Neutrophils Relative %: 25 %
Platelet Count: 258 10*3/uL (ref 150–400)
RBC: 4.4 MIL/uL (ref 4.22–5.81)
RDW: 16.3 % — ABNORMAL HIGH (ref 11.5–15.5)
WBC Count: 3.3 10*3/uL — ABNORMAL LOW (ref 4.0–10.5)
nRBC: 0 % (ref 0.0–0.2)

## 2022-05-24 LAB — LACTATE DEHYDROGENASE: LDH: 154 U/L (ref 98–192)

## 2022-05-24 MED ORDER — HEPARIN SOD (PORK) LOCK FLUSH 100 UNIT/ML IV SOLN
500.0000 [IU] | Freq: Once | INTRAVENOUS | Status: AC
  Administered 2022-05-24: 500 [IU]

## 2022-05-24 MED ORDER — SODIUM CHLORIDE 0.9% FLUSH
10.0000 mL | Freq: Once | INTRAVENOUS | Status: AC
  Administered 2022-05-24: 10 mL

## 2022-05-24 NOTE — Progress Notes (Signed)
[facility] [phone]   [phone]  OFFICE PROGRESS NOTE  Joseph Dills, MD 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  PRIOR THERAPY: A tapered dose of prednisone but his anemia and thrombocytopenia were refractory to the steroids.  CURRENT THERAPY: Systemic treatment with obinutuzumab every 4 weeks for 6 months.  In addition the patient will be treatment with venetoclax initially with a starting dose to be increased gradually to the standard dose of 400 mg p.o. daily for a total of 1 year.  He is status post 9 cycles.  Last dose of obinutuzumab was given on January 26, 2022.  INTERVAL HISTORY: Joseph Moyer 82 y.o. male returns to the clinic today for follow-up visit.  The patient is feeling fine today with no concerning complaints except for erectile dysfunction.  He denied having any chest pain, shortness of breath, cough or hemoptysis.  He has no nausea, vomiting, diarrhea or constipation.  He has no headache or visual changes.  He denied having any significant weight loss or night sweats.  He has been relating his treatment with venetoclax fairly well.  He is here today for evaluation and repeat blood work.  MEDICAL HISTORY: Past Medical History:  Diagnosis Date   Allergy    Arthritis    CAD in native artery    COPD (chronic obstructive pulmonary disease) (HCC)    Frequent PVCs    GERD (gastroesophageal reflux disease)    Hyperlipidemia    Hypertension    Mild carotid artery disease (HCC)    Small lymphocytic lymphoma (HCC)     ALLERGIES:  is allergic to obinutuzumab, oxycodone, ramipril, and rosuvastatin.  MEDICATIONS:  Current Outpatient Medications  Medication Sig Dispense Refill   allopurinol (ZYLOPRIM) 300 MG tablet Take 1 tablet (300 mg  total) by mouth 2 (two) times daily. 60 tablet 0   amLODipine (NORVASC) 5 MG tablet Take 5 mg by mouth daily.     atorvastatin (LIPITOR) 40 MG tablet TAKE 1 TABLET BY MOUTH EVERY DAY 90 tablet 2   diphenhydrAMINE HCl, Sleep, 50 MG CAPS Take 50 mg by mouth at bedtime.     FeFum-FePoly-FA-B Cmp-C-Biot (INTEGRA PLUS) CAPS Take 1 capsule by mouth daily. 30 capsule 3   finasteride (PROSCAR) 5 MG tablet Take 5 mg by mouth daily.     lidocaine-prilocaine (EMLA) cream Apply 1 Application topically as needed. 1-2 hours prior to treatment, apply a tsp of the numbing cream to the skin over the port site and cover with cling wrap or other wrap. Do not rub in the cream. Do not use on the area until the port site is healed . 30 g 0   losartan (COZAAR) 25 MG tablet Take 1 tablet (25 mg total) by mouth daily. 90 tablet 1   meloxicam (MOBIC) 15 MG tablet Take 15 mg by mouth daily as needed.     methocarbamol (ROBAXIN) 750 MG tablet Take 750 mg by mouth every 6 (six) hours as needed.     metoprolol succinate (TOPROL-XL) 25 MG 24 hr tablet TAKE 1 TABLET (25 MG TOTAL) BY MOUTH DAILY. 90 tablet 3   Naphazoline-Glycerin (CLEAR EYES REDNESS RELIEF OP) Place 1 drop into both eyes as needed.     omeprazole (PRILOSEC) 20 MG capsule TAKE 1 CAPSULE BY MOUTH EVERY DAY 90 capsule  0   prochlorperazine (COMPAZINE) 10 MG tablet TAKE 1 TABLET BY MOUTH EVERY 6 HOURS AS NEEDED FOR NAUSEA OR VOMITING. 30 tablet 0   triamcinolone cream (KENALOG) 0.1 % Apply 1 Application topically 2 (two) times daily. 45 g 0   umeclidinium bromide (INCRUSE ELLIPTA) 62.5 MCG/INH AEPB Inhale 1 puff into the lungs daily. 60 each 5   venetoclax (VENCLEXTA) 100 MG tablet Take 4 tablets (400 mg total) by mouth daily. Tablets should be swallowed whole with a meal and a full glass of water. 120 tablet 3   No current facility-administered medications for this visit.    SURGICAL HISTORY:  Past Surgical History:  Procedure Laterality Date   BRONCHIAL NEEDLE  ASPIRATION BIOPSY  03/12/2020   Procedure: BRONCHIAL NEEDLE ASPIRATION BIOPSIES;  Surgeon: Josephine Igo, DO;  Location: [facility];  Service: Pulmonary;;   CIRCUMCISION     COLONOSCOPY WITH PROPOFOL N/A 05/10/2015   Procedure: COLONOSCOPY WITH PROPOFOL;  Surgeon: Charolett Bumpers, MD;  Location: [facility];  Service: Endoscopy;  Laterality: N/A;   DECOMPRESSIVE LUMBAR LAMINECTOMY LEVEL 1 N/A 06/15/2018   Procedure: Delane Ginger decompression L1-L2/ complete inferior facetectomyof L1. Posterior lateral arthrodesiswith autograft bone L1-L2.;  Surgeon: Venita Lick, MD;  Location: [facility];  Service: Orthopedics;  Laterality: N/A;   HEMORRHOID SURGERY     IR IMAGING GUIDED PORT INSERTION  09/29/2021   VIDEO BRONCHOSCOPY WITH ENDOBRONCHIAL ULTRASOUND N/A 03/12/2020   Procedure: VIDEO BRONCHOSCOPY WITH ENDOBRONCHIAL ULTRASOUND;  Surgeon: Josephine Igo, DO;  Location: [facility];  Service: Pulmonary;  Laterality: N/A;    REVIEW OF SYSTEMS:  A comprehensive review of systems was negative.   PHYSICAL EXAMINATION: General appearance: alert, cooperative, and no distress Head: Normocephalic, without obvious abnormality, atraumatic Neck: no adenopathy, no JVD, supple, symmetrical, trachea midline, and thyroid not enlarged, symmetric, no tenderness/mass/nodules Lymph nodes: Cervical, supraclavicular, and axillary nodes normal. Resp: clear to auscultation bilaterally Back: symmetric, no curvature. ROM normal. No CVA tenderness. Cardio: regular rate and rhythm, S1, S2 normal, no murmur, click, rub or gallop GI: soft, non-tender; bowel sounds normal; no masses,  no organomegaly Extremities: extremities normal, atraumatic, no cyanosis or edema  ECOG PERFORMANCE STATUS: 1 - Symptomatic but completely ambulatory  Blood pressure (!) 144/70, pulse 68, temperature 97.7 F (36.5 C), temperature source Temporal, resp. rate 15, weight 219 lb 3.2 oz (99.4 kg), SpO2 99 %.  LABORATORY DATA: Lab Results  Component  Value Date   WBC 5.1 04/19/2022   HGB 12.7 (L) 04/19/2022   HCT 37.6 (L) 04/19/2022   MCV 83.0 04/19/2022   PLT 239 04/19/2022      Chemistry      Component Value Date/Time   NA 138 04/19/2022 1454   NA 139 12/05/2021 1007   K 3.5 04/19/2022 1454   CL 104 04/19/2022 1454   CO2 26 04/19/2022 1454   BUN 16 04/19/2022 1454   BUN 16 12/05/2021 1007   CREATININE 0.77 04/19/2022 1454      Component Value Date/Time   CALCIUM 9.0 04/19/2022 1454   ALKPHOS 59 04/19/2022 1454   AST 19 04/19/2022 1454   ALT 20 04/19/2022 1454   BILITOT 0.8 04/19/2022 1454       RADIOGRAPHIC STUDIES: No results found.   ASSESSMENT AND PLAN: This is a very pleasant 82 years old African-American male recently diagnosed with a small lymphocytic lymphoma with generalized lymphadenopathy in the neck, chest, axilla, abdomen and pelvis. The core biopsy of the left inguinal lymph node was consistent  with the above diagnosis. The patient was seen recently with worsening anemia and thrombocytopenia.  He has imaging studies that showed mildly progressive lymphadenopathy in the right axilla, porta hepatis, retroperitoneum and pelvis but not the mediastinal lymphadenopathy. The patient was treated with a tapered dose of prednisone with no improvement in his anemia and thrombocytopenia. The patient definitely has indication now for treatment of his condition especially with the persistent refractory anemia and thrombocytopenia. The patient is currently on treatment with obinutuzumab and venetoclax.  He is status post 6 doses of obinutuzumab, last dose was giving January 26, 2022.  He is currently on venetoclax 400 mg p.o. daily starting today. The patient has been tolerating his treatment with venetoclax fairly well. Repeat CBC today showed mild leukocytopenia and anemia.  I recommended for the patient to continue his current treatment with venetoclax with the same dose. I will see him back for follow-up visit in 1  months for evaluation and repeat blood work. For the erectile dysfunction, he will discuss with his primary care physician starting treatment for this condition and I do not see any contraindication. He was advised to call immediately if he has any other concerning symptoms in the interval. The patient voices understanding of current disease status and treatment options and is in agreement with the current care plan.  All questions were answered. The patient knows to call the clinic with any problems, questions or concerns. We can certainly see the patient much sooner if necessary.  The total time spent in the appointment was 20 minutes.  Disclaimer: This note was dictated with voice recognition software. Similar sounding words can inadvertently be transcribed and may not be corrected upon review.

## 2022-05-29 ENCOUNTER — Other Ambulatory Visit: Payer: Self-pay | Admitting: Physician Assistant

## 2022-05-31 ENCOUNTER — Other Ambulatory Visit: Payer: Self-pay

## 2022-05-31 ENCOUNTER — Other Ambulatory Visit (HOSPITAL_COMMUNITY): Payer: Self-pay

## 2022-06-13 ENCOUNTER — Other Ambulatory Visit: Payer: Self-pay | Admitting: Physician Assistant

## 2022-06-13 DIAGNOSIS — R21 Rash and other nonspecific skin eruption: Secondary | ICD-10-CM

## 2022-06-14 ENCOUNTER — Other Ambulatory Visit: Payer: Self-pay | Admitting: Physician Assistant

## 2022-06-14 DIAGNOSIS — C8300 Small cell B-cell lymphoma, unspecified site: Secondary | ICD-10-CM

## 2022-06-16 ENCOUNTER — Other Ambulatory Visit (HOSPITAL_COMMUNITY): Payer: Self-pay

## 2022-06-23 ENCOUNTER — Other Ambulatory Visit (HOSPITAL_COMMUNITY): Payer: Self-pay

## 2022-07-07 ENCOUNTER — Other Ambulatory Visit: Payer: Self-pay | Admitting: Physician Assistant

## 2022-07-14 ENCOUNTER — Other Ambulatory Visit (HOSPITAL_COMMUNITY): Payer: Self-pay

## 2022-07-17 ENCOUNTER — Other Ambulatory Visit (HOSPITAL_COMMUNITY): Payer: Self-pay

## 2022-07-18 ENCOUNTER — Telehealth: Payer: Self-pay | Admitting: Internal Medicine

## 2022-07-19 ENCOUNTER — Other Ambulatory Visit (HOSPITAL_COMMUNITY): Payer: Self-pay

## 2022-07-21 ENCOUNTER — Other Ambulatory Visit: Payer: Self-pay

## 2022-07-21 ENCOUNTER — Inpatient Hospital Stay: Payer: Medicare HMO | Attending: Physician Assistant

## 2022-07-21 DIAGNOSIS — C8300 Small cell B-cell lymphoma, unspecified site: Secondary | ICD-10-CM

## 2022-07-21 DIAGNOSIS — C8308 Small cell B-cell lymphoma, lymph nodes of multiple sites: Secondary | ICD-10-CM | POA: Diagnosis not present

## 2022-07-21 DIAGNOSIS — Z95828 Presence of other vascular implants and grafts: Secondary | ICD-10-CM

## 2022-07-21 DIAGNOSIS — D701 Agranulocytosis secondary to cancer chemotherapy: Secondary | ICD-10-CM

## 2022-07-21 LAB — CBC WITH DIFFERENTIAL (CANCER CENTER ONLY)
Abs Immature Granulocytes: 0.02 10*3/uL (ref 0.00–0.07)
Basophils Absolute: 0 10*3/uL (ref 0.0–0.1)
Basophils Relative: 1 %
Eosinophils Absolute: 0.2 10*3/uL (ref 0.0–0.5)
Eosinophils Relative: 5 %
HCT: 35.5 % — ABNORMAL LOW (ref 39.0–52.0)
Hemoglobin: 11.7 g/dL — ABNORMAL LOW (ref 13.0–17.0)
Immature Granulocytes: 1 %
Lymphocytes Relative: 35 %
Lymphs Abs: 1.6 10*3/uL (ref 0.7–4.0)
MCH: 28.5 pg (ref 26.0–34.0)
MCHC: 33 g/dL (ref 30.0–36.0)
MCV: 86.4 fL (ref 80.0–100.0)
Monocytes Absolute: 0.6 10*3/uL (ref 0.1–1.0)
Monocytes Relative: 13 %
Neutro Abs: 2 10*3/uL (ref 1.7–7.7)
Neutrophils Relative %: 45 %
Platelet Count: 272 10*3/uL (ref 150–400)
RBC: 4.11 MIL/uL — ABNORMAL LOW (ref 4.22–5.81)
RDW: 15.2 % (ref 11.5–15.5)
WBC Count: 4.4 10*3/uL (ref 4.0–10.5)
nRBC: 0 % (ref 0.0–0.2)

## 2022-07-21 LAB — CMP (CANCER CENTER ONLY)
ALT: 19 U/L (ref 0–44)
AST: 16 U/L (ref 15–41)
Albumin: 4 g/dL (ref 3.5–5.0)
Alkaline Phosphatase: 65 U/L (ref 38–126)
Anion gap: 6 (ref 5–15)
BUN: 15 mg/dL (ref 8–23)
CO2: 28 mmol/L (ref 22–32)
Calcium: 9 mg/dL (ref 8.9–10.3)
Chloride: 104 mmol/L (ref 98–111)
Creatinine: 0.93 mg/dL (ref 0.61–1.24)
GFR, Estimated: >60 mL/min (ref >60)
Glucose, Bld: 112 mg/dL — ABNORMAL HIGH (ref 70–99)
Potassium: 3.9 mmol/L (ref 3.5–5.1)
Sodium: 138 mmol/L (ref 135–145)
Total Bilirubin: 0.3 mg/dL (ref 0.3–1.2)
Total Protein: 6.6 g/dL (ref 6.5–8.1)

## 2022-07-21 MED ORDER — SODIUM CHLORIDE 0.9% FLUSH
10.0000 mL | Freq: Once | INTRAVENOUS | Status: AC
  Administered 2022-07-21: 10 mL

## 2022-07-21 MED ORDER — HEPARIN SOD (PORK) LOCK FLUSH 100 UNIT/ML IV SOLN
500.0000 [IU] | Freq: Once | INTRAVENOUS | Status: AC
  Administered 2022-07-21: 500 [IU]

## 2022-07-26 ENCOUNTER — Other Ambulatory Visit (HOSPITAL_COMMUNITY): Payer: Self-pay

## 2022-08-09 ENCOUNTER — Inpatient Hospital Stay: Payer: Medicare HMO | Attending: Physician Assistant

## 2022-08-09 ENCOUNTER — Other Ambulatory Visit: Payer: Self-pay

## 2022-08-09 ENCOUNTER — Other Ambulatory Visit: Payer: Self-pay | Admitting: Physician Assistant

## 2022-08-09 ENCOUNTER — Inpatient Hospital Stay: Payer: Medicare HMO | Admitting: Internal Medicine

## 2022-08-09 VITALS — BP 125/64 | HR 68 | Temp 98.1°F | Resp 17 | Ht 71.0 in | Wt 209.3 lb

## 2022-08-09 DIAGNOSIS — C8300 Small cell B-cell lymphoma, unspecified site: Secondary | ICD-10-CM

## 2022-08-09 DIAGNOSIS — C8308 Small cell B-cell lymphoma, lymph nodes of multiple sites: Secondary | ICD-10-CM | POA: Diagnosis not present

## 2022-08-09 DIAGNOSIS — T451X5A Adverse effect of antineoplastic and immunosuppressive drugs, initial encounter: Secondary | ICD-10-CM

## 2022-08-09 DIAGNOSIS — Z95828 Presence of other vascular implants and grafts: Secondary | ICD-10-CM

## 2022-08-09 LAB — CBC WITH DIFFERENTIAL (CANCER CENTER ONLY)
Abs Immature Granulocytes: 0.02 10*3/uL (ref 0.00–0.07)
Basophils Absolute: 0 10*3/uL (ref 0.0–0.1)
Basophils Relative: 1 %
Eosinophils Absolute: 0.2 10*3/uL (ref 0.0–0.5)
Eosinophils Relative: 5 %
HCT: 39.6 % (ref 39.0–52.0)
Hemoglobin: 12.9 g/dL — ABNORMAL LOW (ref 13.0–17.0)
Immature Granulocytes: 1 %
Lymphocytes Relative: 47 %
Lymphs Abs: 2.1 10*3/uL (ref 0.7–4.0)
MCH: 28.4 pg (ref 26.0–34.0)
MCHC: 32.6 g/dL (ref 30.0–36.0)
MCV: 87 fL (ref 80.0–100.0)
Monocytes Absolute: 0.7 10*3/uL (ref 0.1–1.0)
Monocytes Relative: 16 %
Neutro Abs: 1.3 10*3/uL — ABNORMAL LOW (ref 1.7–7.7)
Neutrophils Relative %: 30 %
Platelet Count: 234 10*3/uL (ref 150–400)
RBC: 4.55 MIL/uL (ref 4.22–5.81)
RDW: 15.3 % (ref 11.5–15.5)
WBC Count: 4.3 10*3/uL (ref 4.0–10.5)
nRBC: 0 % (ref 0.0–0.2)

## 2022-08-09 LAB — CMP (CANCER CENTER ONLY)
ALT: 15 U/L (ref 0–44)
AST: 15 U/L (ref 15–41)
Albumin: 4 g/dL (ref 3.5–5.0)
Alkaline Phosphatase: 66 U/L (ref 38–126)
Anion gap: 6 (ref 5–15)
BUN: 17 mg/dL (ref 8–23)
CO2: 29 mmol/L (ref 22–32)
Calcium: 9.2 mg/dL (ref 8.9–10.3)
Chloride: 103 mmol/L (ref 98–111)
Creatinine: 0.97 mg/dL (ref 0.61–1.24)
GFR, Estimated: >60 mL/min (ref >60)
Glucose, Bld: 100 mg/dL — ABNORMAL HIGH (ref 70–99)
Potassium: 4.3 mmol/L (ref 3.5–5.1)
Sodium: 138 mmol/L (ref 135–145)
Total Bilirubin: 0.4 mg/dL (ref 0.3–1.2)
Total Protein: 7.3 g/dL (ref 6.5–8.1)

## 2022-08-09 MED ORDER — HEPARIN SOD (PORK) LOCK FLUSH 100 UNIT/ML IV SOLN
500.0000 [IU] | Freq: Once | INTRAVENOUS | Status: AC
  Administered 2022-08-09: 500 [IU]

## 2022-08-09 MED ORDER — SODIUM CHLORIDE 0.9% FLUSH
10.0000 mL | Freq: Once | INTRAVENOUS | Status: AC
  Administered 2022-08-09: 10 mL

## 2022-08-09 NOTE — Progress Notes (Signed)
[facility] [phone]   [phone]  OFFICE PROGRESS NOTE  Renford Dills, MD 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  PRIOR THERAPY: A tapered dose of prednisone but his anemia and thrombocytopenia were refractory to the steroids.  CURRENT THERAPY: Systemic treatment with obinutuzumab every 4 weeks for 6 months.  In addition the patient will be treatment with venetoclax initially with a starting dose to be increased gradually to the standard dose of 400 mg p.o. daily for a total of 1 year.  He is status post 12 cycles.  Last dose of obinutuzumab was given on January 26, 2022.  INTERVAL HISTORY: Joseph Moyer 82 y.o. male returns to the clinic today for follow-up visit.  The patient is feeling fine today with no concerning complaints.  He denied having any current chest pain, shortness of breath, cough or hemoptysis.  He has no nausea, vomiting, diarrhea or constipation.  He denied having any headache or visual changes.  He has no recent weight loss or night sweats.  He continues to tolerate his treatment with venetoclax fairly well.  The patient is here today for evaluation and repeat blood work.   MEDICAL HISTORY: Past Medical History:  Diagnosis Date   Allergy    Arthritis    CAD in native artery    COPD (chronic obstructive pulmonary disease) (HCC)    Frequent PVCs    GERD (gastroesophageal reflux disease)    Hyperlipidemia    Hypertension    Mild carotid artery disease (HCC)    Small lymphocytic lymphoma (HCC)     ALLERGIES:  is allergic to obinutuzumab, oxycodone, ramipril, and rosuvastatin.  MEDICATIONS:  Current Outpatient Medications  Medication Sig Dispense Refill   allopurinol (ZYLOPRIM) 300 MG tablet Take 1 tablet (300 mg total) by  mouth 2 (two) times daily. 60 tablet 0   amLODipine (NORVASC) 5 MG tablet Take 5 mg by mouth daily.     atorvastatin (LIPITOR) 40 MG tablet TAKE 1 TABLET BY MOUTH EVERY DAY 90 tablet 2   diphenhydrAMINE HCl, Sleep, 50 MG CAPS Take 50 mg by mouth at bedtime.     FeFum-FePoly-FA-B Cmp-C-Biot (INTEGRA PLUS) CAPS Take 1 capsule by mouth daily. 30 capsule 3   finasteride (PROSCAR) 5 MG tablet Take 5 mg by mouth daily.     lidocaine-prilocaine (EMLA) cream Apply 1 Application topically as needed. 1-2 hours prior to treatment, apply a tsp of the numbing cream to the skin over the port site and cover with cling wrap or other wrap. Do not rub in the cream. Do not use on the area until the port site is healed . 30 g 0   losartan (COZAAR) 25 MG tablet Take 1 tablet (25 mg total) by mouth daily. 90 tablet 1   meloxicam (MOBIC) 15 MG tablet Take 15 mg by mouth daily as needed.     methocarbamol (ROBAXIN) 750 MG tablet Take 750 mg by mouth every 6 (six) hours as needed.     metoprolol succinate (TOPROL-XL) 25 MG 24 hr tablet TAKE 1 TABLET (25 MG TOTAL) BY MOUTH DAILY. 90 tablet 3   Naphazoline-Glycerin (CLEAR EYES REDNESS RELIEF OP) Place 1 drop into both eyes as needed.     omeprazole (PRILOSEC) 20 MG capsule TAKE 1 CAPSULE BY MOUTH EVERY DAY 90 capsule 0  prochlorperazine (COMPAZINE) 10 MG tablet TAKE 1 TABLET BY MOUTH EVERY 6 HOURS AS NEEDED FOR NAUSEA OR VOMITING. 30 tablet 0   triamcinolone cream (KENALOG) 0.1 % Apply 1 Application topically 2 (two) times daily. 45 g 0   umeclidinium bromide (INCRUSE ELLIPTA) 62.5 MCG/INH AEPB Inhale 1 puff into the lungs daily. 60 each 5   venetoclax (VENCLEXTA) 100 MG tablet Take 4 tablets (400 mg total) by mouth daily. Tablets should be swallowed whole with a meal and a full glass of water. 120 tablet 3   No current facility-administered medications for this visit.    SURGICAL HISTORY:  Past Surgical History:  Procedure Laterality Date   BRONCHIAL NEEDLE  ASPIRATION BIOPSY  03/12/2020   Procedure: BRONCHIAL NEEDLE ASPIRATION BIOPSIES;  Surgeon: Josephine Igo, DO;  Location: [facility];  Service: Pulmonary;;   CIRCUMCISION     COLONOSCOPY WITH PROPOFOL N/A 05/10/2015   Procedure: COLONOSCOPY WITH PROPOFOL;  Surgeon: Charolett Bumpers, MD;  Location: [facility];  Service: Endoscopy;  Laterality: N/A;   DECOMPRESSIVE LUMBAR LAMINECTOMY LEVEL 1 N/A 06/15/2018   Procedure: Delane Ginger decompression L1-L2/ complete inferior facetectomyof L1. Posterior lateral arthrodesiswith autograft bone L1-L2.;  Surgeon: Venita Lick, MD;  Location: [facility];  Service: Orthopedics;  Laterality: N/A;   HEMORRHOID SURGERY     IR IMAGING GUIDED PORT INSERTION  09/29/2021   VIDEO BRONCHOSCOPY WITH ENDOBRONCHIAL ULTRASOUND N/A 03/12/2020   Procedure: VIDEO BRONCHOSCOPY WITH ENDOBRONCHIAL ULTRASOUND;  Surgeon: Josephine Igo, DO;  Location: [facility];  Service: Pulmonary;  Laterality: N/A;    REVIEW OF SYSTEMS:  A comprehensive review of systems was negative.   PHYSICAL EXAMINATION: General appearance: alert, cooperative, and no distress Head: Normocephalic, without obvious abnormality, atraumatic Neck: no adenopathy, no JVD, supple, symmetrical, trachea midline, and thyroid not enlarged, symmetric, no tenderness/mass/nodules Lymph nodes: Cervical, supraclavicular, and axillary nodes normal. Resp: clear to auscultation bilaterally Back: symmetric, no curvature. ROM normal. No CVA tenderness. Cardio: regular rate and rhythm, S1, S2 normal, no murmur, click, rub or gallop GI: soft, non-tender; bowel sounds normal; no masses,  no organomegaly Extremities: extremities normal, atraumatic, no cyanosis or edema  ECOG PERFORMANCE STATUS: 0 - Asymptomatic  Blood pressure (!) 144/70, pulse 68, temperature 97.7 F (36.5 C), temperature source Temporal, resp. rate 15, weight 219 lb 3.2 oz (99.4 kg), SpO2 99 %.  LABORATORY DATA: Lab Results  Component Value Date   WBC 5.1  04/19/2022   HGB 12.7 (L) 04/19/2022   HCT 37.6 (L) 04/19/2022   MCV 83.0 04/19/2022   PLT 239 04/19/2022      Chemistry      Component Value Date/Time   NA 138 04/19/2022 1454   NA 139 12/05/2021 1007   K 3.5 04/19/2022 1454   CL 104 04/19/2022 1454   CO2 26 04/19/2022 1454   BUN 16 04/19/2022 1454   BUN 16 12/05/2021 1007   CREATININE 0.77 04/19/2022 1454      Component Value Date/Time   CALCIUM 9.0 04/19/2022 1454   ALKPHOS 59 04/19/2022 1454   AST 19 04/19/2022 1454   ALT 20 04/19/2022 1454   BILITOT 0.8 04/19/2022 1454       RADIOGRAPHIC STUDIES: No results found.   ASSESSMENT AND PLAN: This is a very pleasant 82 years old African-American male recently diagnosed with a small lymphocytic lymphoma with generalized lymphadenopathy in the neck, chest, axilla, abdomen and pelvis. The core biopsy of the left inguinal lymph node was consistent with the above diagnosis. The patient  was seen recently with worsening anemia and thrombocytopenia.  He has imaging studies that showed mildly progressive lymphadenopathy in the right axilla, porta hepatis, retroperitoneum and pelvis but not the mediastinal lymphadenopathy. The patient was treated with a tapered dose of prednisone with no improvement in his anemia and thrombocytopenia. The patient definitely has indication now for treatment of his condition especially with the persistent refractory anemia and thrombocytopenia. The patient is currently on treatment with obinutuzumab and venetoclax.  He is status post 6 doses of obinutuzumab, last dose was given January 26, 2022.  He is currently on venetoclax 400 mg p.o. daily starting today. He is expected to complete 1 year of treatment by the end of this months. I recommended for the patient to continue his current treatment with venetoclax for now. I will see him back for follow-up visit in around 3 weeks with repeat PET scan and blood work. He was advised to call immediately if he  has any other concerning symptoms in the interval. The patient voices understanding of current disease status and treatment options and is in agreement with the current care plan.  All questions were answered. The patient knows to call the clinic with any problems, questions or concerns. We can certainly see the patient much sooner if necessary.  The total time spent in the appointment was 20 minutes.  Disclaimer: This note was dictated with voice recognition software. Similar sounding words can inadvertently be transcribed and may not be corrected upon review.

## 2022-08-10 ENCOUNTER — Telehealth: Payer: Self-pay | Admitting: Internal Medicine

## 2022-08-10 NOTE — Telephone Encounter (Signed)
Scheduled per 07/10 los, patient has been called and notified. 

## 2022-08-18 ENCOUNTER — Other Ambulatory Visit: Payer: Self-pay

## 2022-08-23 ENCOUNTER — Other Ambulatory Visit: Payer: Self-pay

## 2022-08-23 ENCOUNTER — Inpatient Hospital Stay: Payer: Medicare HMO

## 2022-08-23 ENCOUNTER — Ambulatory Visit (HOSPITAL_COMMUNITY)
Admission: RE | Admit: 2022-08-23 | Discharge: 2022-08-23 | Disposition: A | Discharge status: Home / Self Care | Payer: Medicare HMO | Source: Ambulatory Visit | Attending: Internal Medicine | Admitting: Internal Medicine

## 2022-08-23 DIAGNOSIS — C9110 Chronic lymphocytic leukemia of B-cell type not having achieved remission: Secondary | ICD-10-CM | POA: Diagnosis not present

## 2022-08-23 DIAGNOSIS — C8300 Small cell B-cell lymphoma, unspecified site: Secondary | ICD-10-CM | POA: Diagnosis not present

## 2022-08-23 LAB — CMP (CANCER CENTER ONLY)
ALT: 13 U/L (ref 0–44)
AST: 13 U/L — ABNORMAL LOW (ref 15–41)
Albumin: 4.2 g/dL (ref 3.5–5.0)
Alkaline Phosphatase: 63 U/L (ref 38–126)
Anion gap: 6 (ref 5–15)
BUN: 14 mg/dL (ref 8–23)
CO2: 29 mmol/L (ref 22–32)
Calcium: 9.3 mg/dL (ref 8.9–10.3)
Chloride: 104 mmol/L (ref 98–111)
Creatinine: 0.85 mg/dL (ref 0.61–1.24)
GFR, Estimated: >60 mL/min (ref >60)
Glucose, Bld: 82 mg/dL (ref 70–99)
Potassium: 4 mmol/L (ref 3.5–5.1)
Sodium: 139 mmol/L (ref 135–145)
Total Bilirubin: 0.3 mg/dL (ref 0.3–1.2)
Total Protein: 6.9 g/dL (ref 6.5–8.1)

## 2022-08-23 LAB — CBC WITH DIFFERENTIAL (CANCER CENTER ONLY)
Abs Immature Granulocytes: 0.02 10*3/uL (ref 0.00–0.07)
Basophils Absolute: 0 10*3/uL (ref 0.0–0.1)
Basophils Relative: 1 %
Eosinophils Absolute: 0.2 10*3/uL (ref 0.0–0.5)
Eosinophils Relative: 2 %
HCT: 39.5 % (ref 39.0–52.0)
Hemoglobin: 13.1 g/dL (ref 13.0–17.0)
Immature Granulocytes: 0 %
Lymphocytes Relative: 39 %
Lymphs Abs: 2.7 10*3/uL (ref 0.7–4.0)
MCH: 28.5 pg (ref 26.0–34.0)
MCHC: 33.2 g/dL (ref 30.0–36.0)
MCV: 86.1 fL (ref 80.0–100.0)
Monocytes Absolute: 0.6 10*3/uL (ref 0.1–1.0)
Monocytes Relative: 9 %
Neutro Abs: 3.4 10*3/uL (ref 1.7–7.7)
Neutrophils Relative %: 49 %
Platelet Count: 230 10*3/uL (ref 150–400)
RBC: 4.59 MIL/uL (ref 4.22–5.81)
RDW: 15.7 % — ABNORMAL HIGH (ref 11.5–15.5)
WBC Count: 6.9 10*3/uL (ref 4.0–10.5)
nRBC: 0 % (ref 0.0–0.2)

## 2022-08-23 LAB — URIC ACID: Uric Acid, Serum: 4.9 mg/dL (ref 3.7–8.6)

## 2022-08-23 LAB — LACTATE DEHYDROGENASE: LDH: 150 U/L (ref 98–192)

## 2022-08-23 LAB — GLUCOSE, CAPILLARY: Glucose-Capillary: 85 mg/dL (ref 70–99)

## 2022-08-23 LAB — MAGNESIUM: Magnesium: 1.7 mg/dL (ref 1.7–2.4)

## 2022-08-23 LAB — PHOSPHORUS: Phosphorus: 3.6 mg/dL (ref 2.5–4.6)

## 2022-08-23 MED ORDER — FLUDEOXYGLUCOSE F - 18 (FDG) INJECTION
9.9800 | Freq: Once | INTRAVENOUS | Status: AC
  Administered 2022-08-23: 9.98 via INTRAVENOUS

## 2022-08-25 NOTE — Progress Notes (Unsigned)
[facility] OFFICE PROGRESS NOTE  Joseph Dills, MD 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    PRIOR THERAPY: 1) A tapered dose of prednisone but his anemia and thrombocytopenia were refractory to the steroids.   2) Systemic treatment with obinutuzumab every 4 weeks for 6 months.  First dose starting 09/07/21. Completed 01/26/22  CURRENT THERAPY:  venetoclax initially with a starting dose to be increased gradually to the standard dose of 400 mg p.o. daily for a total of 1 year. started 09/28/21    INTERVAL HISTORY: Joseph Moyer 82 y.o. male returns to the clinic today for a follow-up visit. The patient was found to have progressive small lymphocytic lymphoma in August 2023 and started treatment with obinutuzumab and venetoclax. He completed his 6 months of Obinutuzmab on 01/26/22. He tolerated this well.  He is currently on 400 mg of venetoclax and tolerating well.   Today, the patient denies any new concerns today except he mentions that he may have a cough secondary to postnasal drainage.  He denies any indigestion.  His most recent scan did not show any signs of infection.  He denies any signs and symptoms of infection such as sore throat, sick contacts, shortness of breath, skin infections, or dysuria.  The patient denies any abnormal bleeding or bruising. His energy is "pretty good". He denies shortness of breath.  He denies any fever, chills, or night sweats.  Denies any lymphadenopathy.  Denies any nausea, vomiting, diarrhea, constipation, abdominal pain, abdominal fullness, early satiety. Denies peripheral neuropathy.  Apparently has not been taking allopurinol for several months.  The patient believes his port was last flushed on 08/09/2022. He recently had a PET scan performed.  He is  here today for evaluation and to review his scan results and discuss the next steps in his care    MEDICAL HISTORY: Past Medical History:  Diagnosis Date   Allergy    Arthritis    CAD in native artery    COPD (chronic obstructive pulmonary disease) (HCC)    Frequent PVCs    GERD (gastroesophageal reflux disease)    Hyperlipidemia    Hypertension    Mild carotid artery disease (HCC)    Small lymphocytic lymphoma (HCC)     ALLERGIES:  is allergic to obinutuzumab, oxycodone, ramipril, and rosuvastatin.  MEDICATIONS:  Current Outpatient Medications  Medication Sig Dispense Refill   amLODipine (NORVASC) 5 MG tablet Take 5 mg by mouth daily.     atorvastatin (LIPITOR) 40 MG tablet TAKE 1 TABLET BY MOUTH EVERY DAY 90 tablet 2   diphenhydrAMINE HCl, Sleep, 50 MG CAPS Take 50 mg by mouth at bedtime.     FeFum-FePoly-FA-B Cmp-C-Biot (INTEGRA PLUS) CAPS Take 1 capsule by mouth daily. 30 capsule 3   finasteride (PROSCAR) 5 MG tablet Take 5 mg by mouth daily.     lidocaine-prilocaine (EMLA) cream Apply 1 Application topically as needed. 1-2 hours prior to treatment, apply a tsp of the numbing cream to the skin over the port site and cover with cling wrap or other wrap. Do not rub in the cream. Do not use on the area until the port site is healed . 30 g 0   losartan (COZAAR) 25 MG tablet Take 1 tablet (25 mg total) by mouth daily. 90 tablet 1   meloxicam (MOBIC) 15 MG  tablet Take 15 mg by mouth daily as needed.     methocarbamol (ROBAXIN) 750 MG tablet Take 750 mg by mouth every 6 (six) hours as needed.     metoprolol succinate (TOPROL-XL) 25 MG 24 hr tablet TAKE 1 TABLET (25 MG TOTAL) BY MOUTH DAILY. 90 tablet 3   Naphazoline-Glycerin (CLEAR EYES REDNESS RELIEF OP) Place 1 drop into both eyes as needed.     omeprazole (PRILOSEC) 20 MG capsule TAKE 1 CAPSULE BY MOUTH EVERY DAY 90 capsule 0   prochlorperazine (COMPAZINE) 10 MG tablet TAKE 1 TABLET BY MOUTH EVERY 6 HOURS AS NEEDED FOR NAUSEA OR  VOMITING. 30 tablet 0   triamcinolone cream (KENALOG) 0.1 % APPLY TO AFFECTED AREA TWICE A DAY 30 g 2   umeclidinium bromide (INCRUSE ELLIPTA) 62.5 MCG/INH AEPB Inhale 1 puff into the lungs daily. 60 each 5   No current facility-administered medications for this visit.    SURGICAL HISTORY:  Past Surgical History:  Procedure Laterality Date   BRONCHIAL NEEDLE ASPIRATION BIOPSY  03/12/2020   Procedure: BRONCHIAL NEEDLE ASPIRATION BIOPSIES;  Surgeon: Josephine Igo, DO;  Location: [facility]COPY;  Service: Pulmonary;;   CIRCUMCISION     COLONOSCOPY WITH PROPOFOL N/A 05/10/2015   Procedure: COLONOSCOPY WITH PROPOFOL;  Surgeon: Charolett Bumpers, MD;  Location: [facility] ENDOSCOPY;  Service: Endoscopy;  Laterality: N/A;   DECOMPRESSIVE LUMBAR LAMINECTOMY LEVEL 1 N/A 06/15/2018   Procedure: Delane Ginger decompression L1-L2/ complete inferior facetectomyof L1. Posterior lateral arthrodesiswith autograft bone L1-L2.;  Surgeon: Venita Lick, MD;  Location: [facility] OR;  Service: Orthopedics;  Laterality: N/A;   HEMORRHOID SURGERY     IR IMAGING GUIDED PORT INSERTION  09/29/2021   VIDEO BRONCHOSCOPY WITH ENDOBRONCHIAL ULTRASOUND N/A 03/12/2020   Procedure: VIDEO BRONCHOSCOPY WITH ENDOBRONCHIAL ULTRASOUND;  Surgeon: Josephine Igo, DO;  Location: [facility];  Service: Pulmonary;  Laterality: N/A;    REVIEW OF SYSTEMS:   Constitutional: Negative for appetite change, chills, fatigue, fever and unexpected weight change.  HENT: Negative for mouth sores, nosebleeds, sore throat and trouble swallowing.   Eyes: Negative for eye problems and icterus.  Respiratory: Positive for cough at night. Negative for hemoptysis, shortness of breath and wheezing.   Cardiovascular: Negative for chest pain and leg swelling.  Gastrointestinal: Negative for abdominal pain, constipation, diarrhea, nausea and vomiting.  Genitourinary: Negative for bladder incontinence, difficulty urinating, dysuria, frequency and hematuria.   Musculoskeletal:  Negative for back pain, gait problem, neck pain and neck stiffness.  Skin: Positive for dry skin and rash on anterior chest wall.  Neurological: Negative for dizziness, extremity weakness, gait problem, headaches, light-headedness and seizures.  Hematological: Negative for adenopathy. Does not bruise/bleed easily.  Psychiatric/Behavioral: Negative for confusion, depression and sleep disturbance. The patient is not nervous/anxious.     PHYSICAL EXAMINATION:  Blood pressure 127/67, pulse 72, temperature (!) 97.3 F (36.3 C), resp. rate 20, weight 209 lb 4.8 oz (94.9 kg), SpO2 100%.  ECOG PERFORMANCE STATUS: 1  Physical Exam  Constitutional: Oriented to person, place, and time and well-developed, well-nourished, and in no distress.  HENT:  Head: Normocephalic and atraumatic.  Mouth/Throat: Oropharynx is clear and moist. No oropharyngeal exudate.  Eyes: Conjunctivae are normal. Right eye exhibits no discharge. Left eye exhibits no discharge. No scleral icterus.  Neck: Normal range of motion. Neck supple.  Cardiovascular: Normal rate, regular rhythm, normal heart sounds and intact distal pulses.   Pulmonary/Chest: Effort normal and breath sounds normal. No respiratory distress. No wheezes. No rales.  Abdominal:  Soft. Bowel sounds are normal. Exhibits no distension and no mass. There is no tenderness.  Musculoskeletal: Normal range of motion. Exhibits no edema.  Lymphadenopathy:    No cervical adenopathy.  Neurological: Alert and oriented to person, place, and time. Exhibits normal muscle tone. Gait normal. Coordination normal.  Skin: Skin is warm and dry. Scaly well circumscribed rash/patch on anterior chest.  Not diaphoretic. No erythema. No pallor.  Psychiatric: Mood, memory and judgment normal.  Vitals reviewed.  LABORATORY DATA: Lab Results  Component Value Date   WBC 6.9 08/23/2022   HGB 13.1 08/23/2022   HCT 39.5 08/23/2022   MCV 86.1 08/23/2022   PLT 230 08/23/2022       Chemistry      Component Value Date/Time   NA 139 08/23/2022 1400   NA 139 12/05/2021 1007   K 4.0 08/23/2022 1400   CL 104 08/23/2022 1400   CO2 29 08/23/2022 1400   BUN 14 08/23/2022 1400   BUN 16 12/05/2021 1007   CREATININE 0.85 08/23/2022 1400      Component Value Date/Time   CALCIUM 9.3 08/23/2022 1400   ALKPHOS 63 08/23/2022 1400   AST 13 (L) 08/23/2022 1400   ALT 13 08/23/2022 1400   BILITOT 0.3 08/23/2022 1400       RADIOGRAPHIC STUDIES:  NM PET Image Restage (PS) Skull Base to Thigh (F-18 FDG)  Result Date: 08/29/2022 CLINICAL DATA:  Subsequent treatment strategy for CLL. EXAM: NUCLEAR MEDICINE PET SKULL BASE TO THIGH TECHNIQUE: 10.0 mCi F-18 FDG was injected intravenously. Full-ring PET imaging was performed from the skull base to thigh after the radiotracer. CT data was obtained and used for attenuation correction and anatomic localization. Fasting blood glucose:  mg/dl COMPARISON:  Chest item post CT 02/21/2022.  PET-CT 03/08/2020 FINDINGS: Mediastinal blood pool activity: SUV max 2.5 Liver activity: SUV max 3.7 NECK: No hypermetabolic lymph nodes in the neck. Incidental CT findings: None. CHEST: No hypermetabolic mediastinal or hilar nodes. Index left axillary node seen to be hypermetabolic on previous PET imaging now show SUV max = 0.8. Incidental CT findings: Right Port-A-Cath tip is positioned in the right atrium Mild atherosclerotic calcification is noted in the wall of the thoracic aorta. Coronary artery calcification is evident. No suspicious pulmonary nodule or mass. No focal airspace consolidation. No evidence for pleural effusion. ABDOMEN/PELVIS: No abnormal hypermetabolic activity within the liver, pancreas, adrenal glands, or spleen. No hypermetabolic lymph nodes in the abdomen or pelvis. 1.3 cm short axis index left para-aortic lymph node which demonstrated SUV max = 3.2 previously now measures 0.5 cm short axis on image 137/4 with SUV max = 1.0 Incidental CT  findings: Calcified gallstone evident. There is mild atherosclerotic calcification of the abdominal aorta without aneurysm. Left colonic diverticulosis evident without diverticulitis. Mild circumferential bladder wall thickening suspected despite underdistention. Prostate gland is enlarged. SKELETON: No focal hypermetabolic activity to suggest skeletal metastasis. Incidental CT findings: No worrisome lytic or sclerotic osseous abnormality. IMPRESSION: 1. Interval resolution of hypermetabolic adenopathy in the chest abdomen and pelvis. No new or progressive hypermetabolic adenopathy on today's study. 2. Cholelithiasis. 3. Left colonic diverticulosis without diverticulitis. 4.  Aortic Atherosclerosis (ICD10-I70.0). Electronically Signed   By: Kennith Center M.D.   On: 08/29/2022 09:17     ASSESSMENT/PLAN:  This is a very pleasant 82 year old African-American male diagnosed with a small lymphocytic lymphoma with generalized lymphadenopathy in the neck, chest, axilla, abdomen and pelvis.  The core biopsy of the left inguinal lymph node was  consistent with the above diagnosis.  He developed worsening anemia and thrombocytopenia. His imaging studies showed mildly progressive lymphadenopathy in the right axillary, porta hepatis, retroperitoneum, and pelvis. The patient received a tapering dose of prednisone without any improvement in his thrombocytopenia.   Dr. Arbutus Ped recommended starting treatment with  obinutuzumab for 6 months and venetoclax for the duration of 1 year.    He is status post 6 cycles of obinutuzumab. 6 months of treatment was completed on 01/26/22. He is currently on venetoclax 400 mg daily. He started his first dose of venetoclax on 09/28/21.  He has completed about 1 year.   The patient recently had a PET scan performed.  The patient was seen with Dr. Si Gaul personally and independently reviewed the scan showed interval resolution of the hypermetabolic adenopathy in the chest,  abdomen, pelvis.  There is no evidence of progressive hypermetabolic adenopathy.  Dr. Arbutus Ped recommends that the patient continue on observation.  I let him know and crossed off on his medicine list that he does not need to take venetoclax anymore.  He also does not need to take allopurinol, but apparently he has not been taking this.  We will see him back for follow-up visit with lab work in 6 months.  Of course if the patient has any worsening symptoms he is advised to call us sooner for evaluation.  The patient's port was last flushed around 08/09/2022.  I will make a flush appointment every 6 to 8 weeks. The patient was advised to call immediately if he has any concerning symptoms in the interval. The patient voices understanding of current disease status and treatment options and is in agreement with the current care plan. All questions were answered. The patient knows to call the clinic with any problems, questions or concerns. We can certainly see the patient much sooner if necessary  Orders Placed This Encounter  Procedures   CBC with Differential (Cancer Center Only)    Standing Status:   Future    Standing Expiration Date:   08/30/2023   CMP (Cancer Center only)    Standing Status:   Future    Standing Expiration Date:   08/30/2023   Lactate dehydrogenase (LDH)    Standing Status:   Future    Standing Expiration Date:   08/30/2023      Johnette Abraham Franki Stemen, PA-C 08/30/22  ADDENDUM: Hematology/Oncology Attending: I had a face to face encounter with the patient today.  I reviewed his record, lab, scan and recommended his care plan.  This is a very pleasant 82 years old African-American male with small lymphocytic lymphoma presented with generalized lymphadenopathy in the neck, mediastinum, axilla, abdomen as well as inguinal area diagnosed initially in February 2022 with hemolytic anemia and thrombocytopenia requiring treatment in July 2023.  The patient started treatment  with obinutuzumab for 6 months in addition to venetoclax for 1 year.  He tolerated this treatment fairly well with significant improvement of his disease. He had a PET scan performed recently.  I personally and independently reviewed the PET scan and discussed the result with the patient today. His PET scan showed significant improvement of his disease with resolution of the hypermetabolic adenopathy in the chest, abdomen and pelvis and no new or progressive hypermetabolic adenopathy. I recommended for the patient to continue on observation with repeat blood work in 6 months. He was advised to call immediately if he has any other concerning symptoms in the interval. The total time spent in the appointment  was 30 minutes. Disclaimer: This note was dictated with voice recognition software. Similar sounding words can inadvertently be transcribed and may be missed upon review. Lajuana Matte, MD

## 2022-08-29 ENCOUNTER — Ambulatory Visit: Payer: Medicare HMO | Admitting: Physician Assistant

## 2022-08-30 ENCOUNTER — Other Ambulatory Visit: Payer: Self-pay

## 2022-08-30 ENCOUNTER — Inpatient Hospital Stay: Payer: Medicare HMO | Admitting: Physician Assistant

## 2022-08-30 VITALS — BP 127/67 | HR 72 | Temp 97.3°F | Resp 20 | Wt 209.3 lb

## 2022-08-30 DIAGNOSIS — C8300 Small cell B-cell lymphoma, unspecified site: Secondary | ICD-10-CM | POA: Diagnosis not present

## 2022-08-30 DIAGNOSIS — C8308 Small cell B-cell lymphoma, lymph nodes of multiple sites: Secondary | ICD-10-CM | POA: Diagnosis not present

## 2022-09-05 ENCOUNTER — Other Ambulatory Visit (HOSPITAL_COMMUNITY): Payer: Self-pay

## 2022-09-05 ENCOUNTER — Other Ambulatory Visit: Payer: Self-pay

## 2022-09-09 ENCOUNTER — Other Ambulatory Visit: Payer: Self-pay | Admitting: Physician Assistant

## 2022-09-13 ENCOUNTER — Other Ambulatory Visit: Payer: Self-pay | Admitting: Physician Assistant

## 2022-09-13 DIAGNOSIS — C8300 Small cell B-cell lymphoma, unspecified site: Secondary | ICD-10-CM

## 2022-09-16 ENCOUNTER — Encounter: Payer: Self-pay | Admitting: Internal Medicine

## 2022-10-04 ENCOUNTER — Encounter: Payer: Self-pay | Admitting: Internal Medicine

## 2022-10-11 ENCOUNTER — Inpatient Hospital Stay: Payer: No Typology Code available for payment source | Attending: Physician Assistant

## 2022-10-11 DIAGNOSIS — Z95828 Presence of other vascular implants and grafts: Secondary | ICD-10-CM

## 2022-10-11 DIAGNOSIS — Z452 Encounter for adjustment and management of vascular access device: Secondary | ICD-10-CM | POA: Diagnosis not present

## 2022-10-11 DIAGNOSIS — T451X5A Adverse effect of antineoplastic and immunosuppressive drugs, initial encounter: Secondary | ICD-10-CM

## 2022-10-11 DIAGNOSIS — C8308 Small cell B-cell lymphoma, lymph nodes of multiple sites: Secondary | ICD-10-CM | POA: Diagnosis not present

## 2022-10-11 MED ORDER — HEPARIN SOD (PORK) LOCK FLUSH 100 UNIT/ML IV SOLN
500.0000 [IU] | Freq: Once | INTRAVENOUS | Status: AC
  Administered 2022-10-11: 500 [IU]

## 2022-10-11 MED ORDER — SODIUM CHLORIDE 0.9% FLUSH
10.0000 mL | Freq: Once | INTRAVENOUS | Status: AC
  Administered 2022-10-11: 10 mL

## 2022-10-16 ENCOUNTER — Other Ambulatory Visit: Payer: Self-pay | Admitting: Physician Assistant

## 2022-10-18 ENCOUNTER — Other Ambulatory Visit: Payer: Self-pay | Admitting: Physician Assistant

## 2022-10-18 ENCOUNTER — Other Ambulatory Visit: Payer: Self-pay

## 2022-10-18 DIAGNOSIS — C8300 Small cell B-cell lymphoma, unspecified site: Secondary | ICD-10-CM

## 2022-10-18 MED ORDER — METOPROLOL SUCCINATE ER 25 MG PO TB24: 25.0000 mg | ORAL_TABLET | Freq: Every day | ORAL | 0 refills | Status: DC

## 2022-11-03 IMAGING — CT CT HEART MORP W/ CTA COR W/ SCORE W/ CA W/CM &/OR W/O CM
4 of 7 series · 8 of 20 positions shown, 9 images · IV contrast (APPLIED)
Comparison: Abdominal CT 09/02/2014
COMPARISON: Abdominal CT 09/02/2014

Addendum:
EXAM:
OVER-READ INTERPRETATION  CT CHEST

The following report is an over-read performed by radiologist Dr.
Sally Theis [[email or web address]] on 02/10/2020. This over-read
does not include interpretation of cardiac or coronary anatomy or
pathology. The coronary calcium score/coronary CTA interpretation by
the cardiologist is attached.
CLINICAL DATA: 79M with hypertension, carotid stenosis,
hyperlipidemia, and exertional dyspnea.
Cardiac/Coronary  CT
TECHNIQUE: The patient was scanned on a [employer] Force scanner.

[Series 6: best diast 72 % · axial · 0.39mm/px · z∈[-430,-389]mm · 2 of 308 slices shown, 3 images]
[im 103/308  vessel]
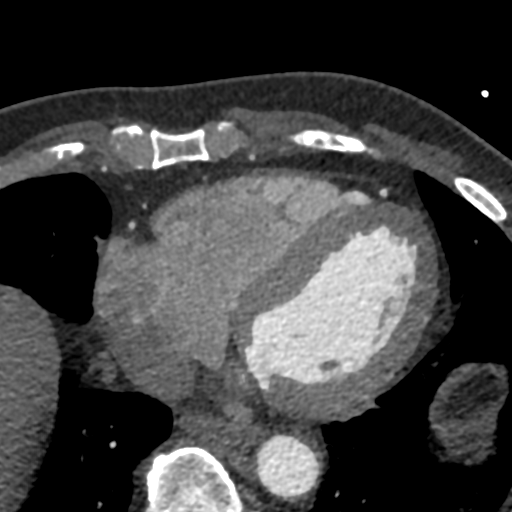
[im 103/308  lung]
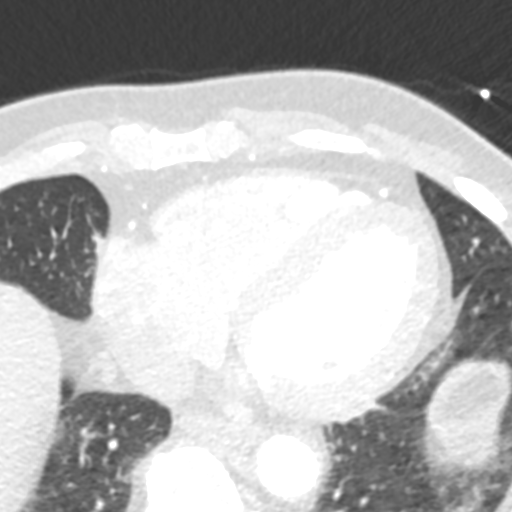
[im 205/308  vessel]
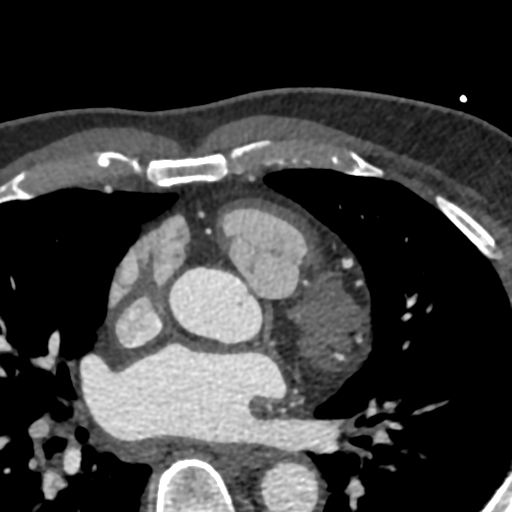

[Series 7: best syst 32 % · axial · 0.39mm/px · z∈[-430,-389]mm · 2 of 308 slices shown]
[im 103/308  vessel]
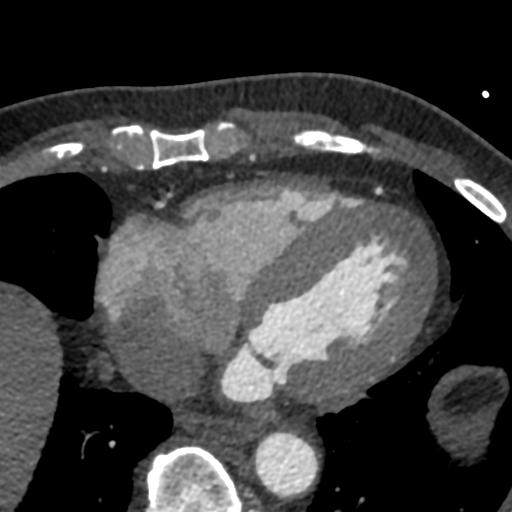
[im 205/308  vessel]
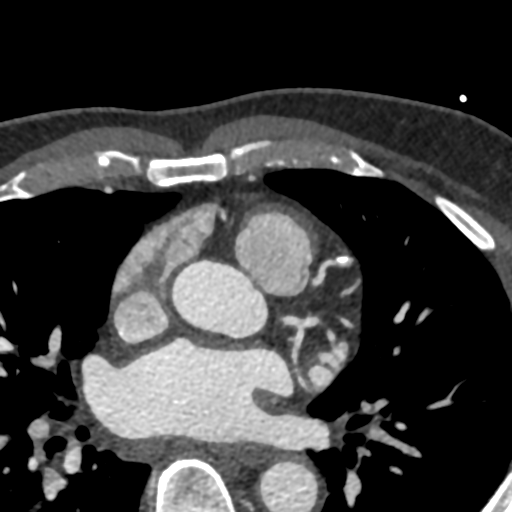

[Series 8: ts diast sharp 72 % · axial · 0.39mm/px · z∈[-430,-389]mm · 2 of 308 slices shown]
[im 103/308  lung]
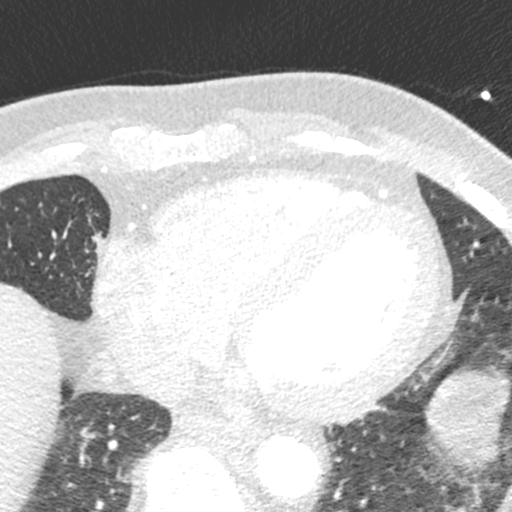
[im 205/308  lung]
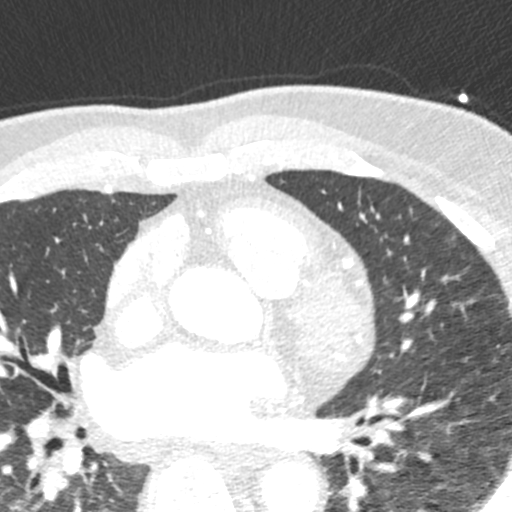

[Series 9: ts syst sharp 32 % · axial · 0.39mm/px · z∈[-430,-389]mm · 2 of 308 slices shown]
[im 103/308  lung]
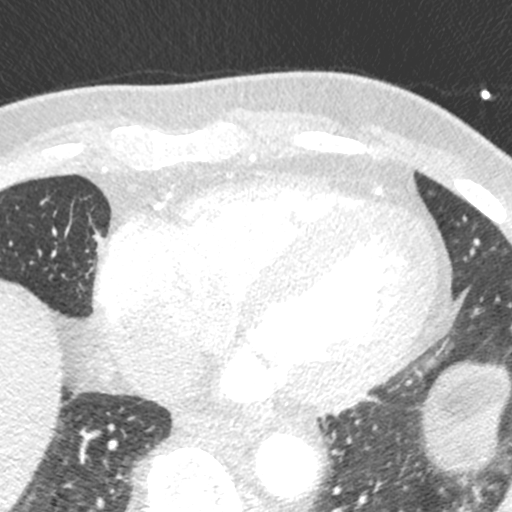
[im 205/308  lung]
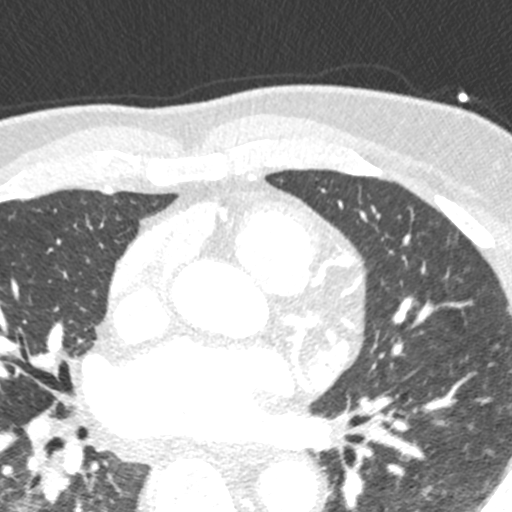

[8 of 20 positions shown; findings below may reference images not displayed]

FINDINGS: Vascular: Mild atherosclerotic disease in the descending thoracic
aorta. Visualized main pulmonary arteries are patent.

Mediastinum/Nodes: Lymph node or nodule anterior to the SVC on
sequence 11, image 1 measures 1.2 cm in the short axis. Enlarged
soft tissue in the right subcarinal region measuring 2 cm in the
short axis on sequence 11, image 10. Prominent right hilar tissue on
sequence 11, image 6 measures 1.9 cm in the short axis. Small hiatal
hernia.

Lungs/Pleura: Visualized lungs are clear. No large pleural
effusions.

Upper Abdomen: There may be a tiny hypodensity at the hepatic dome
on image 47 but this is too small to definitively characterize.

Musculoskeletal: Disc space narrowing with Schmorl's nodes in the
thoracic spine. Findings are compatible with degenerative changes.
IMPRESSION: 1. Evidence for mediastinal and hilar lymphadenopathy. Findings are
nonspecific and mediastinum is incompletely imaged. Recommend
further evaluation with a chest CT with IV contrast.
2. Small hiatal hernia.
3.  Aortic Atherosclerosis (75DJZ-XDE.E).

These results will be called to the ordering clinician or
representative by the Radiologist Assistant, and communication
documented in the PACS or [REDACTED].
FINDINGS: A 120 kV prospective scan was triggered in the descending thoracic
aorta at 111 HU's. Axial non-contrast 3 mm slices were carried out
through the heart. The data set was analyzed on a dedicated work
station and scored using the Agatson method. Gantry rotation speed
was 250 msecs and collimation was .6 mm. No beta blockade and 0.8 mg
of sl NTG was given. The 3D data set was reconstructed in 5%
intervals of the 67-82 % of the R-R cycle. Diastolic phases were
analyzed on a dedicated work station using MPR, MIP and VRT modes.
The patient received 80 cc of contrast.

Aorta: Normal size. Ascending aorta 3.0 cm. Mild calcifications. No
dissection.

Aortic Valve:  Trileaflet.  Mild calcification.

Coronary Arteries:  Normal coronary origin.  Right dominance.

RCA is a large dominant artery that gives rise to PDA and PLVB.
There is minimal (<25%) calcified plaque proximally and mild
(25-49%) calcified plaque in the mid RCA.

Left main is a large artery that gives rise to LAD and LCX arteries.

LAD is a large vessel. There is mild (25-49%) mixed plaque at the
ostium and proximal LAD. There is moderate (50-69%) mixed plaque in
the mid LAD. There is a small diagonal with mild (25-49%) calcified
plaque at the ostium.

LCX is a non-dominant artery that gives rise to one large OM1
branch. There is minimal (<25%) calcified plaque proximally. There
is a large, branching OM1

Other findings:

Normal pulmonary vein drainage into the left atrium.

Normal let atrial appendage without a thrombus.

Normal size of the pulmonary artery.
IMPRESSION: 1. Coronary calcium score of 257. This was 67th percentile for age
and sex matched control.

2.  Normal coronary origin with right dominance.

3. Moderate plaque in the mid LAD, mild plaque in the proximal LAD
and D1. CAD RADS 3.

4.  Mild calcification of the aorta.

5.  Mild calcification of the aortic valve.

6. Will send study for FFRct to assess for obstructive disease in
the LAD.

*** End of Addendum ***
EXAM:
OVER-READ INTERPRETATION  CT CHEST

The following report is an over-read performed by radiologist Dr.
Sally Theis [[email or web address]] on 02/10/2020. This over-read
does not include interpretation of cardiac or coronary anatomy or
pathology. The coronary calcium score/coronary CTA interpretation by
the cardiologist is attached.
FINDINGS: Vascular: Mild atherosclerotic disease in the descending thoracic
aorta. Visualized main pulmonary arteries are patent.

Mediastinum/Nodes: Lymph node or nodule anterior to the SVC on
sequence 11, image 1 measures 1.2 cm in the short axis. Enlarged
soft tissue in the right subcarinal region measuring 2 cm in the
short axis on sequence 11, image 10. Prominent right hilar tissue on
sequence 11, image 6 measures 1.9 cm in the short axis. Small hiatal
hernia.

Lungs/Pleura: Visualized lungs are clear. No large pleural
effusions.

Upper Abdomen: There may be a tiny hypodensity at the hepatic dome
on image 47 but this is too small to definitively characterize.

Musculoskeletal: Disc space narrowing with Schmorl's nodes in the
thoracic spine. Findings are compatible with degenerative changes.
IMPRESSION: 1. Evidence for mediastinal and hilar lymphadenopathy. Findings are
nonspecific and mediastinum is incompletely imaged. Recommend
further evaluation with a chest CT with IV contrast.
2. Small hiatal hernia.
3.  Aortic Atherosclerosis (75DJZ-XDE.E).

These results will be called to the ordering clinician or
representative by the Radiologist Assistant, and communication
documented in the PACS or [REDACTED].

## 2022-11-13 DIAGNOSIS — J439 Emphysema, unspecified: Secondary | ICD-10-CM | POA: Diagnosis not present

## 2022-11-13 DIAGNOSIS — I779 Disorder of arteries and arterioles, unspecified: Secondary | ICD-10-CM | POA: Diagnosis not present

## 2022-11-13 DIAGNOSIS — E7800 Pure hypercholesterolemia, unspecified: Secondary | ICD-10-CM | POA: Diagnosis not present

## 2022-11-13 DIAGNOSIS — C8300 Small cell B-cell lymphoma, unspecified site: Secondary | ICD-10-CM | POA: Diagnosis not present

## 2022-11-13 DIAGNOSIS — M25511 Pain in right shoulder: Secondary | ICD-10-CM | POA: Diagnosis not present

## 2022-11-13 DIAGNOSIS — E1169 Type 2 diabetes mellitus with other specified complication: Secondary | ICD-10-CM | POA: Diagnosis not present

## 2022-11-13 DIAGNOSIS — E119 Type 2 diabetes mellitus without complications: Secondary | ICD-10-CM | POA: Diagnosis not present

## 2022-11-13 DIAGNOSIS — I10 Essential (primary) hypertension: Secondary | ICD-10-CM | POA: Diagnosis not present

## 2022-11-14 ENCOUNTER — Other Ambulatory Visit: Payer: Self-pay | Admitting: Internal Medicine

## 2022-11-14 ENCOUNTER — Ambulatory Visit
Admission: RE | Admit: 2022-11-14 | Discharge: 2022-11-14 | Disposition: A | Discharge status: Home / Self Care | Payer: No Typology Code available for payment source | Source: Ambulatory Visit | Attending: Internal Medicine | Admitting: Internal Medicine

## 2022-11-14 DIAGNOSIS — M25511 Pain in right shoulder: Secondary | ICD-10-CM

## 2022-11-14 DIAGNOSIS — E1169 Type 2 diabetes mellitus with other specified complication: Secondary | ICD-10-CM | POA: Diagnosis not present

## 2022-11-14 DIAGNOSIS — M19011 Primary osteoarthritis, right shoulder: Secondary | ICD-10-CM | POA: Diagnosis not present

## 2022-11-20 ENCOUNTER — Other Ambulatory Visit: Payer: Self-pay | Admitting: Physician Assistant

## 2022-11-20 ENCOUNTER — Other Ambulatory Visit: Payer: Self-pay | Admitting: *Deleted

## 2022-11-20 DIAGNOSIS — C8300 Small cell B-cell lymphoma, unspecified site: Secondary | ICD-10-CM

## 2022-11-22 ENCOUNTER — Other Ambulatory Visit: Payer: Self-pay

## 2022-11-22 ENCOUNTER — Inpatient Hospital Stay: Payer: No Typology Code available for payment source | Attending: Physician Assistant

## 2022-11-22 DIAGNOSIS — Z95828 Presence of other vascular implants and grafts: Secondary | ICD-10-CM

## 2022-11-22 DIAGNOSIS — Z452 Encounter for adjustment and management of vascular access device: Secondary | ICD-10-CM | POA: Diagnosis not present

## 2022-11-22 DIAGNOSIS — T451X5A Adverse effect of antineoplastic and immunosuppressive drugs, initial encounter: Secondary | ICD-10-CM

## 2022-11-22 DIAGNOSIS — C8308 Small cell B-cell lymphoma, lymph nodes of multiple sites: Secondary | ICD-10-CM | POA: Diagnosis not present

## 2022-11-22 MED ORDER — HEPARIN SOD (PORK) LOCK FLUSH 100 UNIT/ML IV SOLN
500.0000 [IU] | Freq: Once | INTRAVENOUS | Status: AC
  Administered 2022-11-22: 500 [IU]

## 2022-11-22 MED ORDER — SODIUM CHLORIDE 0.9% FLUSH
10.0000 mL | Freq: Once | INTRAVENOUS | Status: AC
  Administered 2022-11-22: 10 mL

## 2022-12-29 ENCOUNTER — Other Ambulatory Visit: Payer: Self-pay | Admitting: Physician Assistant

## 2023-01-03 ENCOUNTER — Inpatient Hospital Stay: Payer: No Typology Code available for payment source

## 2023-01-15 ENCOUNTER — Other Ambulatory Visit: Payer: Self-pay | Admitting: Physician Assistant

## 2023-01-19 ENCOUNTER — Other Ambulatory Visit: Payer: Self-pay | Admitting: Physician Assistant

## 2023-01-26 ENCOUNTER — Other Ambulatory Visit: Payer: Self-pay | Admitting: Physician Assistant

## 2023-01-26 DIAGNOSIS — C8300 Small cell B-cell lymphoma, unspecified site: Secondary | ICD-10-CM

## 2023-02-03 ENCOUNTER — Other Ambulatory Visit: Payer: Self-pay | Admitting: Physician Assistant

## 2023-02-12 ENCOUNTER — Other Ambulatory Visit: Payer: Self-pay | Admitting: Physician Assistant

## 2023-02-14 ENCOUNTER — Encounter: Payer: Self-pay | Admitting: Internal Medicine

## 2023-02-14 ENCOUNTER — Inpatient Hospital Stay: Payer: No Typology Code available for payment source | Attending: Physician Assistant

## 2023-02-14 DIAGNOSIS — C8308 Small cell B-cell lymphoma, lymph nodes of multiple sites: Secondary | ICD-10-CM | POA: Diagnosis not present

## 2023-02-14 DIAGNOSIS — Z452 Encounter for adjustment and management of vascular access device: Secondary | ICD-10-CM | POA: Insufficient documentation

## 2023-02-17 ENCOUNTER — Other Ambulatory Visit: Payer: Self-pay | Admitting: Physician Assistant

## 2023-02-26 ENCOUNTER — Other Ambulatory Visit: Payer: Self-pay | Admitting: Physician Assistant

## 2023-02-28 ENCOUNTER — Inpatient Hospital Stay (HOSPITAL_BASED_OUTPATIENT_CLINIC_OR_DEPARTMENT_OTHER): Payer: No Typology Code available for payment source | Admitting: Internal Medicine

## 2023-02-28 ENCOUNTER — Inpatient Hospital Stay: Payer: No Typology Code available for payment source

## 2023-02-28 VITALS — BP 143/79 | HR 69 | Temp 98.0°F | Resp 17 | Ht 71.0 in | Wt 210.9 lb

## 2023-02-28 DIAGNOSIS — C8300 Small cell B-cell lymphoma, unspecified site: Secondary | ICD-10-CM | POA: Diagnosis not present

## 2023-02-28 DIAGNOSIS — Z95828 Presence of other vascular implants and grafts: Secondary | ICD-10-CM

## 2023-02-28 DIAGNOSIS — D701 Agranulocytosis secondary to cancer chemotherapy: Secondary | ICD-10-CM

## 2023-02-28 DIAGNOSIS — C8308 Small cell B-cell lymphoma, lymph nodes of multiple sites: Secondary | ICD-10-CM | POA: Diagnosis not present

## 2023-02-28 LAB — CMP (CANCER CENTER ONLY)
ALT: 33 U/L (ref 0–44)
AST: 19 U/L (ref 15–41)
Albumin: 4.2 g/dL (ref 3.5–5.0)
Alkaline Phosphatase: 68 U/L (ref 38–126)
Anion gap: 5 (ref 5–15)
BUN: 16 mg/dL (ref 8–23)
CO2: 31 mmol/L (ref 22–32)
Calcium: 9.3 mg/dL (ref 8.9–10.3)
Chloride: 105 mmol/L (ref 98–111)
Creatinine: 0.79 mg/dL (ref 0.61–1.24)
GFR, Estimated: >60 mL/min (ref >60)
Glucose, Bld: 107 mg/dL — ABNORMAL HIGH (ref 70–99)
Potassium: 4 mmol/L (ref 3.5–5.1)
Sodium: 141 mmol/L (ref 135–145)
Total Bilirubin: 0.4 mg/dL (ref 0.0–1.2)
Total Protein: 7.1 g/dL (ref 6.5–8.1)

## 2023-02-28 LAB — CBC WITH DIFFERENTIAL (CANCER CENTER ONLY)
Abs Immature Granulocytes: 0.02 10*3/uL (ref 0.00–0.07)
Basophils Absolute: 0 10*3/uL (ref 0.0–0.1)
Basophils Relative: 1 %
Eosinophils Absolute: 0.2 10*3/uL (ref 0.0–0.5)
Eosinophils Relative: 3 %
HCT: 38.2 % — ABNORMAL LOW (ref 39.0–52.0)
Hemoglobin: 12.5 g/dL — ABNORMAL LOW (ref 13.0–17.0)
Immature Granulocytes: 0 %
Lymphocytes Relative: 42 %
Lymphs Abs: 2.7 10*3/uL (ref 0.7–4.0)
MCH: 27.7 pg (ref 26.0–34.0)
MCHC: 32.7 g/dL (ref 30.0–36.0)
MCV: 84.5 fL (ref 80.0–100.0)
Monocytes Absolute: 0.4 10*3/uL (ref 0.1–1.0)
Monocytes Relative: 6 %
Neutro Abs: 3.2 10*3/uL (ref 1.7–7.7)
Neutrophils Relative %: 48 %
Platelet Count: 258 10*3/uL (ref 150–400)
RBC: 4.52 MIL/uL (ref 4.22–5.81)
RDW: 14.9 % (ref 11.5–15.5)
WBC Count: 6.5 10*3/uL (ref 4.0–10.5)
nRBC: 0 % (ref 0.0–0.2)

## 2023-02-28 LAB — LACTATE DEHYDROGENASE: LDH: 162 U/L (ref 98–192)

## 2023-02-28 MED ORDER — HEPARIN SOD (PORK) LOCK FLUSH 100 UNIT/ML IV SOLN
500.0000 [IU] | Freq: Once | INTRAVENOUS | Status: AC
  Administered 2023-02-28: 500 [IU]

## 2023-02-28 MED ORDER — SODIUM CHLORIDE 0.9% FLUSH
10.0000 mL | Freq: Once | INTRAVENOUS | Status: AC
  Administered 2023-02-28: 10 mL

## 2023-02-28 NOTE — Progress Notes (Signed)
[facility] [phone]   [phone]  OFFICE PROGRESS NOTE  Joseph Dills, MD 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  PRIOR THERAPY:  1) A tapered dose of prednisone but his anemia and thrombocytopenia were refractory to the steroids. 2) Systemic treatment with obinutuzumab every 4 weeks for 6 months.  In addition the patient will be treatment with venetoclax initially with a starting dose to be increased gradually to the standard dose of 400 mg p.o. daily for a total of 1 year.  He is status post 12 cycles.  Last dose of obinutuzumab was given on January 26, 2022.  CURRENT THERAPY: Observation.  INTERVAL HISTORY: Joseph Moyer 83 y.o. male returns to the clinic today for 21-month follow-up visit accompanied by his wife.  Discussed the use of AI scribe software for clinical note transcription with the patient, who gave verbal consent to proceed.  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 He is accompanied by his wife.  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 Since the last visit, he has been feeling well with no significant symptoms such as chest pain, breathing issues, nausea, vomiting, diarrhea, or new swollen lymph nodes. There is no significant weight loss, and any lost weight has been regained.  He experienced a recent episode of coughing, described as 'cold symptoms,' for which he is taking over-the-counter Mucinex. There is no fever or significant shortness of  breath associated with this cough.      MEDICAL HISTORY: Past Medical History:  Diagnosis Date   Allergy    Arthritis    CAD in native artery    COPD (chronic obstructive pulmonary disease) (HCC)    Frequent PVCs    GERD (gastroesophageal reflux disease)    Hyperlipidemia    Hypertension    Mild carotid artery disease (HCC)    Small lymphocytic lymphoma (HCC)     ALLERGIES:  is allergic to obinutuzumab, oxycodone, ramipril, and rosuvastatin.  MEDICATIONS:  Current Outpatient Medications  Medication Sig Dispense Refill   amLODipine (NORVASC) 5 MG tablet Take 5 mg by mouth daily.     atorvastatin (LIPITOR) 40 MG tablet TAKE 1 TABLET BY MOUTH EVERY DAY 15 tablet 0   diphenhydrAMINE HCl, Sleep, 50 MG CAPS Take 50 mg by mouth at bedtime.     FeFum-FePoly-FA-B Cmp-C-Biot (INTEGRA PLUS) CAPS Take 1 capsule by mouth daily. 30 capsule 3   finasteride (PROSCAR) 5 MG tablet Take 5 mg by mouth daily.     lidocaine-prilocaine (EMLA) cream Apply 1 Application topically as needed. 1-2 hours prior to treatment, apply a tsp of the numbing cream to the skin over the port site and cover with cling wrap or other wrap. Do not rub in the cream. Do not use on the area until the port site is healed . 30 g 0   losartan (COZAAR) 25 MG tablet Take 1 tablet (25 mg total) by mouth daily. 90 tablet 1   meloxicam (MOBIC) 15 MG tablet Take 15 mg by mouth daily as needed.     methocarbamol (ROBAXIN) 750  MG tablet Take 750 mg by mouth every 6 (six) hours as needed.     metoprolol succinate (TOPROL-XL) 25 MG 24 hr tablet TAKE 1 TABLET (25 MG TOTAL) BY MOUTH DAILY. 15 tablet 0   Naphazoline-Glycerin (CLEAR EYES REDNESS RELIEF OP) Place 1 drop into both eyes as needed.     omeprazole (PRILOSEC) 20 MG capsule TAKE 1 CAPSULE BY MOUTH EVERY DAY 90 capsule 0   prochlorperazine (COMPAZINE) 10 MG tablet TAKE 1 TABLET BY MOUTH EVERY 6 HOURS AS NEEDED FOR NAUSEA OR VOMITING. 30 tablet 0   triamcinolone cream (KENALOG) 0.1 %  APPLY TO AFFECTED AREA TWICE A DAY 30 g 2   umeclidinium bromide (INCRUSE ELLIPTA) 62.5 MCG/INH AEPB Inhale 1 puff into the lungs daily. 60 each 5   No current facility-administered medications for this visit.    SURGICAL HISTORY:  Past Surgical History:  Procedure Laterality Date   BRONCHIAL NEEDLE ASPIRATION BIOPSY  03/12/2020   Procedure: BRONCHIAL NEEDLE ASPIRATION BIOPSIES;  Surgeon: Josephine Igo, DO;  Location: [facility];  Service: Pulmonary;;   CIRCUMCISION     COLONOSCOPY WITH PROPOFOL N/A 05/10/2015   Procedure: COLONOSCOPY WITH PROPOFOL;  Surgeon: Charolett Bumpers, MD;  Location: [facility];  Service: Endoscopy;  Laterality: N/A;   DECOMPRESSIVE LUMBAR LAMINECTOMY LEVEL 1 N/A 06/15/2018   Procedure: Delane Ginger decompression L1-L2/ complete inferior facetectomyof L1. Posterior lateral arthrodesiswith autograft bone L1-L2.;  Surgeon: Venita Lick, MD;  Location: [facility];  Service: Orthopedics;  Laterality: N/A;   HEMORRHOID SURGERY     IR IMAGING GUIDED PORT INSERTION  09/29/2021   VIDEO BRONCHOSCOPY WITH ENDOBRONCHIAL ULTRASOUND N/A 03/12/2020   Procedure: VIDEO BRONCHOSCOPY WITH ENDOBRONCHIAL ULTRASOUND;  Surgeon: Josephine Igo, DO;  Location: [facility];  Service: Pulmonary;  Laterality: N/A;    REVIEW OF SYSTEMS:  A comprehensive review of systems was negative except for: Respiratory: positive for cough   PHYSICAL EXAMINATION: General appearance: alert, cooperative, and no distress Head: Normocephalic, without obvious abnormality, atraumatic Neck: no adenopathy, no JVD, supple, symmetrical, trachea midline, and thyroid not enlarged, symmetric, no tenderness/mass/nodules Lymph nodes: Cervical, supraclavicular, and axillary nodes normal. Resp: clear to auscultation bilaterally Back: symmetric, no curvature. ROM normal. No CVA tenderness. Cardio: regular rate and rhythm, S1, S2 normal, no murmur, click, rub or gallop GI: soft, non-tender; bowel sounds normal; no masses,  no  organomegaly Extremities: extremities normal, atraumatic, no cyanosis or edema  ECOG PERFORMANCE STATUS: 0 - Asymptomatic  Blood pressure (!) 143/79, pulse 69, temperature 98 F (36.7 C), temperature source Temporal, resp. rate 17, height 5\' 11"  (1.803 m), weight 210 lb 14.4 oz (95.7 kg), SpO2 99%.  LABORATORY DATA: Lab Results  Component Value Date   WBC 6.9 08/23/2022   HGB 13.1 08/23/2022   HCT 39.5 08/23/2022   MCV 86.1 08/23/2022   PLT 230 08/23/2022      Chemistry      Component Value Date/Time   NA 139 08/23/2022 1400   NA 139 12/05/2021 1007   K 4.0 08/23/2022 1400   CL 104 08/23/2022 1400   CO2 29 08/23/2022 1400   BUN 14 08/23/2022 1400   BUN 16 12/05/2021 1007   CREATININE 0.85 08/23/2022 1400      Component Value Date/Time   CALCIUM 9.3 08/23/2022 1400   ALKPHOS 63 08/23/2022 1400   AST 13 (L) 08/23/2022 1400   ALT 13 08/23/2022 1400   BILITOT 0.3 08/23/2022 1400       RADIOGRAPHIC STUDIES: No results found.  ASSESSMENT AND PLAN: This is a very pleasant 83 years old African-American male recently diagnosed with a small lymphocytic lymphoma with generalized lymphadenopathy in the neck, chest, axilla, abdomen and pelvis. The core biopsy of the left inguinal lymph node was consistent with the above diagnosis. The patient was seen recently with worsening anemia and thrombocytopenia.  He has imaging studies that showed mildly progressive lymphadenopathy in the right axilla, porta hepatis, retroperitoneum and pelvis but not the mediastinal lymphadenopathy. The patient was treated with a tapered dose of prednisone with no improvement in his anemia and thrombocytopenia. The patient definitely has indication now for treatment of his condition especially with the persistent refractory anemia and thrombocytopenia. The patient is currently on treatment with obinutuzumab and venetoclax.  He is status post 6 doses of obinutuzumab, last dose was given January 26, 2022.   He is currently on venetoclax 400 mg p.o. daily starting today. He is expected to complete 1 year of treatment by the end of this months.  Chronic Lymphocytic Leukemia (CLL) and Small Lymphocytic Lymphoma (SLL) Diagnosed in February 2022. Completed one year of obinutuzumab and venetoclax. Currently asymptomatic with no new lymphadenopathy. Awaiting today's blood work results. Plan to alternate between lab-only and lab with scan for future visits. Instructed to report new symptoms immediately. - Order blood work results - Schedule follow-up in six months - Order lab and scan one week before next visit - Alternate between lab-only and lab with scan for future visits - Instruct to report new symptoms immediately  Cold Symptoms Reports recent cold symptoms with cough. Using Mucinex OTC. No fever or significant dyspnea. - Continue Mucinex as needed - Monitor for fever or significant dyspnea.   The patient was advised to call immediately if he has any other concerning symptoms in the interval.  The patient voices understanding of current disease status and treatment options and is in agreement with the current care plan.  All questions were answered. The patient knows to call the clinic with any problems, questions or concerns. We can certainly see the patient much sooner if necessary.  The total time spent in the appointment was 20 minutes.  Disclaimer: This note was dictated with voice recognition software. Similar sounding words can inadvertently be transcribed and may not be corrected upon review.

## 2023-03-03 ENCOUNTER — Other Ambulatory Visit: Payer: Self-pay | Admitting: Physician Assistant

## 2023-03-11 ENCOUNTER — Other Ambulatory Visit: Payer: Self-pay | Admitting: Physician Assistant

## 2023-03-22 ENCOUNTER — Other Ambulatory Visit: Payer: Self-pay | Admitting: Physician Assistant

## 2023-03-28 ENCOUNTER — Inpatient Hospital Stay: Payer: No Typology Code available for payment source | Attending: Physician Assistant

## 2023-03-28 VITALS — BP 131/70 | HR 70 | Temp 98.9°F | Resp 18

## 2023-03-28 DIAGNOSIS — Z452 Encounter for adjustment and management of vascular access device: Secondary | ICD-10-CM | POA: Insufficient documentation

## 2023-03-28 DIAGNOSIS — C8308 Small cell B-cell lymphoma, lymph nodes of multiple sites: Secondary | ICD-10-CM | POA: Diagnosis not present

## 2023-03-28 DIAGNOSIS — Z95828 Presence of other vascular implants and grafts: Secondary | ICD-10-CM

## 2023-03-28 DIAGNOSIS — T451X5A Adverse effect of antineoplastic and immunosuppressive drugs, initial encounter: Secondary | ICD-10-CM

## 2023-03-28 MED ORDER — SODIUM CHLORIDE 0.9% FLUSH
10.0000 mL | Freq: Once | INTRAVENOUS | Status: AC
  Administered 2023-03-28: 10 mL

## 2023-03-28 MED ORDER — HEPARIN SOD (PORK) LOCK FLUSH 100 UNIT/ML IV SOLN
500.0000 [IU] | Freq: Once | INTRAVENOUS | Status: AC
Start: 2023-03-28 — End: 2023-03-28
  Administered 2023-03-28: 500 [IU]

## 2023-04-20 ENCOUNTER — Other Ambulatory Visit: Payer: Self-pay | Admitting: Physician Assistant

## 2023-04-22 ENCOUNTER — Other Ambulatory Visit: Payer: Self-pay | Admitting: Physician Assistant

## 2023-05-06 ENCOUNTER — Other Ambulatory Visit: Payer: Self-pay | Admitting: Physician Assistant

## 2023-05-09 DIAGNOSIS — R059 Cough, unspecified: Secondary | ICD-10-CM | POA: Diagnosis not present

## 2023-05-16 ENCOUNTER — Inpatient Hospital Stay: Payer: No Typology Code available for payment source | Attending: Physician Assistant

## 2023-05-16 VITALS — BP 138/69 | HR 71 | Temp 98.5°F | Resp 18

## 2023-05-16 DIAGNOSIS — Z95828 Presence of other vascular implants and grafts: Secondary | ICD-10-CM

## 2023-05-16 DIAGNOSIS — Z452 Encounter for adjustment and management of vascular access device: Secondary | ICD-10-CM | POA: Diagnosis not present

## 2023-05-16 DIAGNOSIS — T451X5A Adverse effect of antineoplastic and immunosuppressive drugs, initial encounter: Secondary | ICD-10-CM

## 2023-05-16 DIAGNOSIS — C8308 Small cell B-cell lymphoma, lymph nodes of multiple sites: Secondary | ICD-10-CM | POA: Diagnosis not present

## 2023-05-16 DIAGNOSIS — H2513 Age-related nuclear cataract, bilateral: Secondary | ICD-10-CM | POA: Diagnosis not present

## 2023-05-16 MED ORDER — SODIUM CHLORIDE 0.9% FLUSH
10.0000 mL | Freq: Once | INTRAVENOUS | Status: AC
  Administered 2023-05-16: 10 mL

## 2023-05-16 MED ORDER — HEPARIN SOD (PORK) LOCK FLUSH 100 UNIT/ML IV SOLN
500.0000 [IU] | Freq: Once | INTRAVENOUS | Status: AC
Start: 2023-05-16 — End: 2023-05-16
  Administered 2023-05-16: 500 [IU]

## 2023-05-18 ENCOUNTER — Other Ambulatory Visit: Payer: Self-pay | Admitting: Physician Assistant

## 2023-06-09 ENCOUNTER — Other Ambulatory Visit: Payer: Self-pay | Admitting: Physician Assistant

## 2023-06-09 DIAGNOSIS — C8300 Small cell B-cell lymphoma, unspecified site: Secondary | ICD-10-CM

## 2023-06-15 ENCOUNTER — Other Ambulatory Visit: Payer: Self-pay | Admitting: Physician Assistant

## 2023-06-27 ENCOUNTER — Inpatient Hospital Stay: Payer: No Typology Code available for payment source | Attending: Physician Assistant

## 2023-06-27 VITALS — BP 125/76 | HR 73 | Temp 98.3°F | Resp 17

## 2023-06-27 DIAGNOSIS — T451X5A Adverse effect of antineoplastic and immunosuppressive drugs, initial encounter: Secondary | ICD-10-CM

## 2023-06-27 DIAGNOSIS — C8308 Small cell B-cell lymphoma, lymph nodes of multiple sites: Secondary | ICD-10-CM | POA: Insufficient documentation

## 2023-06-27 DIAGNOSIS — Z95828 Presence of other vascular implants and grafts: Secondary | ICD-10-CM

## 2023-06-27 MED ORDER — HEPARIN SOD (PORK) LOCK FLUSH 100 UNIT/ML IV SOLN
500.0000 [IU] | Freq: Once | INTRAVENOUS | Status: AC
  Administered 2023-06-27: 500 [IU]

## 2023-06-27 MED ORDER — SODIUM CHLORIDE 0.9% FLUSH
10.0000 mL | Freq: Once | INTRAVENOUS | Status: AC
  Administered 2023-06-27: 10 mL

## 2023-06-28 ENCOUNTER — Other Ambulatory Visit: Payer: Self-pay | Admitting: Physician Assistant

## 2023-07-07 ENCOUNTER — Other Ambulatory Visit: Payer: Self-pay | Admitting: Physician Assistant

## 2023-07-07 DIAGNOSIS — C8300 Small cell B-cell lymphoma, unspecified site: Secondary | ICD-10-CM

## 2023-07-23 DIAGNOSIS — J439 Emphysema, unspecified: Secondary | ICD-10-CM | POA: Diagnosis not present

## 2023-07-23 DIAGNOSIS — C8308 Small cell B-cell lymphoma, lymph nodes of multiple sites: Secondary | ICD-10-CM | POA: Diagnosis not present

## 2023-07-23 DIAGNOSIS — Z008 Encounter for other general examination: Secondary | ICD-10-CM | POA: Diagnosis not present

## 2023-07-23 DIAGNOSIS — K219 Gastro-esophageal reflux disease without esophagitis: Secondary | ICD-10-CM | POA: Diagnosis not present

## 2023-07-23 DIAGNOSIS — Z6828 Body mass index (BMI) 28.0-28.9, adult: Secondary | ICD-10-CM | POA: Diagnosis not present

## 2023-07-23 DIAGNOSIS — N400 Enlarged prostate without lower urinary tract symptoms: Secondary | ICD-10-CM | POA: Diagnosis not present

## 2023-07-23 DIAGNOSIS — E785 Hyperlipidemia, unspecified: Secondary | ICD-10-CM | POA: Diagnosis not present

## 2023-07-23 DIAGNOSIS — I10 Essential (primary) hypertension: Secondary | ICD-10-CM | POA: Diagnosis not present

## 2023-07-23 DIAGNOSIS — E1169 Type 2 diabetes mellitus with other specified complication: Secondary | ICD-10-CM | POA: Diagnosis not present

## 2023-07-23 DIAGNOSIS — F17211 Nicotine dependence, cigarettes, in remission: Secondary | ICD-10-CM | POA: Diagnosis not present

## 2023-07-23 DIAGNOSIS — E663 Overweight: Secondary | ICD-10-CM | POA: Diagnosis not present

## 2023-07-25 ENCOUNTER — Other Ambulatory Visit: Payer: Self-pay | Admitting: Physician Assistant

## 2023-08-22 ENCOUNTER — Inpatient Hospital Stay: Payer: No Typology Code available for payment source | Attending: Physician Assistant

## 2023-08-22 ENCOUNTER — Ambulatory Visit (HOSPITAL_COMMUNITY)
Admission: RE | Admit: 2023-08-22 | Discharge: 2023-08-22 | Disposition: A | Discharge status: Home / Self Care | Source: Ambulatory Visit | Attending: Internal Medicine | Admitting: Internal Medicine

## 2023-08-22 ENCOUNTER — Other Ambulatory Visit: Payer: Self-pay | Admitting: Internal Medicine

## 2023-08-22 DIAGNOSIS — D696 Thrombocytopenia, unspecified: Secondary | ICD-10-CM | POA: Insufficient documentation

## 2023-08-22 DIAGNOSIS — C8300 Small cell B-cell lymphoma, unspecified site: Secondary | ICD-10-CM | POA: Insufficient documentation

## 2023-08-22 DIAGNOSIS — R059 Cough, unspecified: Secondary | ICD-10-CM | POA: Insufficient documentation

## 2023-08-22 DIAGNOSIS — C8308 Small cell B-cell lymphoma, lymph nodes of multiple sites: Secondary | ICD-10-CM | POA: Insufficient documentation

## 2023-08-22 DIAGNOSIS — D649 Anemia, unspecified: Secondary | ICD-10-CM | POA: Diagnosis not present

## 2023-08-22 DIAGNOSIS — Z95828 Presence of other vascular implants and grafts: Secondary | ICD-10-CM | POA: Diagnosis not present

## 2023-08-22 DIAGNOSIS — D701 Agranulocytosis secondary to cancer chemotherapy: Secondary | ICD-10-CM

## 2023-08-22 DIAGNOSIS — K8020 Calculus of gallbladder without cholecystitis without obstruction: Secondary | ICD-10-CM | POA: Diagnosis not present

## 2023-08-22 LAB — CMP (CANCER CENTER ONLY)
ALT: 29 U/L (ref 0–44)
AST: 19 U/L (ref 15–41)
Albumin: 4.2 g/dL (ref 3.5–5.0)
Alkaline Phosphatase: 49 U/L (ref 38–126)
Anion gap: 6 (ref 5–15)
BUN: 15 mg/dL (ref 8–23)
CO2: 29 mmol/L (ref 22–32)
Calcium: 9.1 mg/dL (ref 8.9–10.3)
Chloride: 104 mmol/L (ref 98–111)
Creatinine: 0.87 mg/dL (ref 0.61–1.24)
GFR, Estimated: >60 mL/min (ref >60)
Glucose, Bld: 105 mg/dL — ABNORMAL HIGH (ref 70–99)
Potassium: 3.9 mmol/L (ref 3.5–5.1)
Sodium: 139 mmol/L (ref 135–145)
Total Bilirubin: 0.4 mg/dL (ref 0.0–1.2)
Total Protein: 7.2 g/dL (ref 6.5–8.1)

## 2023-08-22 LAB — CBC WITH DIFFERENTIAL (CANCER CENTER ONLY)
Abs Immature Granulocytes: 0.01 K/uL (ref 0.00–0.07)
Basophils Absolute: 0 K/uL (ref 0.0–0.1)
Basophils Relative: 1 %
Eosinophils Absolute: 0.2 K/uL (ref 0.0–0.5)
Eosinophils Relative: 4 %
HCT: 40.8 % (ref 39.0–52.0)
Hemoglobin: 13.5 g/dL (ref 13.0–17.0)
Immature Granulocytes: 0 %
Lymphocytes Relative: 46 %
Lymphs Abs: 2.8 K/uL (ref 0.7–4.0)
MCH: 28.1 pg (ref 26.0–34.0)
MCHC: 33.1 g/dL (ref 30.0–36.0)
MCV: 84.8 fL (ref 80.0–100.0)
Monocytes Absolute: 0.4 K/uL (ref 0.1–1.0)
Monocytes Relative: 7 %
Neutro Abs: 2.5 K/uL (ref 1.7–7.7)
Neutrophils Relative %: 42 %
Platelet Count: 216 K/uL (ref 150–400)
RBC: 4.81 MIL/uL (ref 4.22–5.81)
RDW: 14.6 % (ref 11.5–15.5)
WBC Count: 6 K/uL (ref 4.0–10.5)
nRBC: 0 % (ref 0.0–0.2)

## 2023-08-22 LAB — LACTATE DEHYDROGENASE: LDH: 159 U/L (ref 98–192)

## 2023-08-22 MED ORDER — HEPARIN SOD (PORK) LOCK FLUSH 100 UNIT/ML IV SOLN
INTRAVENOUS | Status: AC
  Filled 2023-08-22: qty 5

## 2023-08-22 MED ORDER — IOHEXOL 9 MG/ML PO SOLN
1000.0000 mL | ORAL | Status: AC
  Administered 2023-08-22: 1000 mL via ORAL

## 2023-08-22 MED ORDER — HEPARIN SOD (PORK) LOCK FLUSH 100 UNIT/ML IV SOLN
500.0000 [IU] | Freq: Once | INTRAVENOUS | Status: AC
  Administered 2023-08-22: 500 [IU] via INTRAVENOUS

## 2023-08-22 MED ORDER — IOHEXOL 300 MG/ML  SOLN
100.0000 mL | Freq: Once | INTRAMUSCULAR | Status: AC | PRN
  Administered 2023-08-22: 100 mL via INTRAVENOUS

## 2023-08-22 MED ORDER — SODIUM CHLORIDE 0.9% FLUSH
10.0000 mL | Freq: Once | INTRAVENOUS | Status: AC
Start: 2023-08-22 — End: 2023-08-22
  Administered 2023-08-22: 10 mL

## 2023-08-22 MED ORDER — IOHEXOL 9 MG/ML PO SOLN
ORAL | Status: AC
  Filled 2023-08-22: qty 1000

## 2023-08-29 ENCOUNTER — Inpatient Hospital Stay (HOSPITAL_BASED_OUTPATIENT_CLINIC_OR_DEPARTMENT_OTHER): Payer: No Typology Code available for payment source | Admitting: Internal Medicine

## 2023-08-29 VITALS — BP 137/71 | HR 71 | Temp 97.2°F | Resp 16 | Ht 71.0 in | Wt 205.0 lb

## 2023-08-29 DIAGNOSIS — C8300 Small cell B-cell lymphoma, unspecified site: Secondary | ICD-10-CM | POA: Diagnosis not present

## 2023-08-29 DIAGNOSIS — C8308 Small cell B-cell lymphoma, lymph nodes of multiple sites: Secondary | ICD-10-CM | POA: Diagnosis not present

## 2023-08-29 NOTE — Progress Notes (Signed)
 [facility] [phone]   [phone]  OFFICE PROGRESS NOTE  Rexanne Ingle, MD 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  PRIOR THERAPY:  1) A tapered dose of prednisone  but his anemia and thrombocytopenia were refractory to the steroids. 2) Systemic treatment with obinutuzumab  every 4 weeks for 6 months.  In addition the patient will be treatment with venetoclax  initially with a starting dose to be increased gradually to the standard dose of 400 mg p.o. daily for a total of 1 year.  He is status post 12 cycles.  Last dose of obinutuzumab  was given on January 26, 2022.  CURRENT THERAPY: Observation.  INTERVAL HISTORY: Joseph Moyer 83 y.o. male returns to the clinic today for 75-month follow-up visit accompanied by his wife. Discussed the use of AI scribe software for clinical note transcription with the patient, who gave verbal consent to proceed.  History of Present Illness Joseph Moyer is an 83 year old male with small lymphocytic lymphoma who presents for evaluation with repeat CT scan for restaging of his disease.  He was diagnosed with small lymphocytic lymphoma in February 2022. He completed systemic treatment with obinutuzumab  for six months, with the last dose administered in December 2023, and venetoclax  for one month, with the last treatment completed in June 2024. Since then, he has been under observation.  He experiences a cough, particularly at night, and coughs up mucus. No chest pain, breathing issues, nausea, vomiting, diarrhea, or weight loss.       MEDICAL HISTORY: Past Medical History:  Diagnosis Date   Allergy    Arthritis    CAD in native artery    COPD (chronic obstructive pulmonary disease) (HCC)    Frequent PVCs    GERD  (gastroesophageal reflux disease)    Hyperlipidemia    Hypertension    Mild carotid artery disease (HCC)    Small lymphocytic lymphoma (HCC)     ALLERGIES:  is allergic to obinutuzumab , oxycodone , ramipril, and rosuvastatin .  MEDICATIONS:  Current Outpatient Medications  Medication Sig Dispense Refill   amLODipine  (NORVASC ) 5 MG tablet Take 5 mg by mouth daily.     atorvastatin  (LIPITOR) 40 MG tablet TAKE 1 TABLET BY MOUTH EVERY DAY 15 tablet 0   diphenhydrAMINE  HCl, Sleep, 50 MG CAPS Take 50 mg by mouth at bedtime.     FeFum-FePoly-FA-B Cmp-C-Biot (INTEGRA PLUS ) CAPS Take 1 capsule by mouth daily. 30 capsule 3   finasteride (PROSCAR) 5 MG tablet Take 5 mg by mouth daily.     lidocaine -prilocaine  (EMLA ) cream Apply 1 Application topically as needed. 1-2 hours prior to treatment, apply a tsp of the numbing cream to the skin over the port site and cover with cling wrap or other wrap. Do not rub in the cream. Do not use on the area until the port site is healed . 30 g 0   losartan  (COZAAR ) 25 MG tablet Take 1 tablet (25 mg total) by mouth daily. 90 tablet 1   meloxicam  (MOBIC ) 15 MG tablet Take 15 mg by mouth daily as needed.     methocarbamol  (ROBAXIN ) 750 MG tablet Take 750 mg by mouth every 6 (six) hours as needed.     metoprolol  succinate (TOPROL -XL) 25 MG 24 hr tablet Take 1 tablet (25 mg total) by mouth daily. PT.  MUST MAKE AN APPOINTMENT IN ORDER TO RECEIVE FUTURE REFILLS. THIRD AND FINAL ATTEMPT. 7 tablet 0   Naphazoline-Glycerin (CLEAR EYES REDNESS RELIEF OP) Place 1 drop into both eyes as needed.     omeprazole  (PRILOSEC) 20 MG capsule TAKE 1 CAPSULE BY MOUTH EVERY DAY 90 capsule 0   prochlorperazine  (COMPAZINE ) 10 MG tablet TAKE 1 TABLET BY MOUTH EVERY 6 HOURS AS NEEDED FOR NAUSEA OR VOMITING. 30 tablet 0   triamcinolone  cream (KENALOG ) 0.1 % APPLY TO AFFECTED AREA TWICE A DAY 30 g 2   umeclidinium bromide  (INCRUSE ELLIPTA ) 62.5 MCG/INH AEPB Inhale 1 puff into the lungs daily. 60  each 5   No current facility-administered medications for this visit.    SURGICAL HISTORY:  Past Surgical History:  Procedure Laterality Date   BRONCHIAL NEEDLE ASPIRATION BIOPSY  03/12/2020   Procedure: BRONCHIAL NEEDLE ASPIRATION BIOPSIES;  Surgeon: Brenna Adine CROME, DO;  Location: [facility];  Service: Pulmonary;;   CIRCUMCISION     COLONOSCOPY WITH PROPOFOL  N/A 05/10/2015   Procedure: COLONOSCOPY WITH PROPOFOL ;  Surgeon: Gladis MARLA Louder, MD;  Location: [facility];  Service: Endoscopy;  Laterality: N/A;   DECOMPRESSIVE LUMBAR LAMINECTOMY LEVEL 1 N/A 06/15/2018   Procedure: Marvis decompression L1-L2/ complete inferior facetectomyof L1. Posterior lateral arthrodesiswith autograft bone L1-L2.;  Surgeon: Burnetta Aures, MD;  Location: [facility];  Service: Orthopedics;  Laterality: N/A;   HEMORRHOID SURGERY     IR IMAGING GUIDED PORT INSERTION  09/29/2021   VIDEO BRONCHOSCOPY WITH ENDOBRONCHIAL ULTRASOUND N/A 03/12/2020   Procedure: VIDEO BRONCHOSCOPY WITH ENDOBRONCHIAL ULTRASOUND;  Surgeon: Brenna Adine CROME, DO;  Location: [facility];  Service: Pulmonary;  Laterality: N/A;    REVIEW OF SYSTEMS:  Constitutional: negative Eyes: negative Ears, nose, mouth, throat, and face: negative Respiratory: positive for cough Cardiovascular: negative Gastrointestinal: negative Genitourinary:negative Integument/breast: negative Hematologic/lymphatic: negative Musculoskeletal:negative Neurological: negative Behavioral/Psych: negative Endocrine: negative Allergic/Immunologic: negative   PHYSICAL EXAMINATION: General appearance: alert, cooperative, and no distress Head: Normocephalic, without obvious abnormality, atraumatic Neck: no adenopathy, no JVD, supple, symmetrical, trachea midline, and thyroid  not enlarged, symmetric, no tenderness/mass/nodules Lymph nodes: Cervical, supraclavicular, and axillary nodes normal. Resp: clear to auscultation bilaterally Back: symmetric, no curvature. ROM  normal. No CVA tenderness. Cardio: regular rate and rhythm, S1, S2 normal, no murmur, click, rub or gallop GI: soft, non-tender; bowel sounds normal; no masses,  no organomegaly Extremities: extremities normal, atraumatic, no cyanosis or edema Neurologic: Alert and oriented X 3, normal strength and tone. Normal symmetric reflexes. Normal coordination and gait  ECOG PERFORMANCE STATUS: 0 - Asymptomatic  Blood pressure 137/71, pulse 71, temperature (!) 97.2 F (36.2 C), temperature source Temporal, resp. rate 16, height 5' 11 (1.803 m), weight 205 lb (93 kg), SpO2 100%.  LABORATORY DATA: Lab Results  Component Value Date   WBC 6.0 08/22/2023   HGB 13.5 08/22/2023   HCT 40.8 08/22/2023   MCV 84.8 08/22/2023   PLT 216 08/22/2023      Chemistry      Component Value Date/Time   NA 139 08/22/2023 0752   NA 139 12/05/2021 1007   K 3.9 08/22/2023 0752   CL 104 08/22/2023 0752   CO2 29 08/22/2023 0752   BUN 15 08/22/2023 0752   BUN 16 12/05/2021 1007   CREATININE 0.87 08/22/2023 0752      Component Value Date/Time   CALCIUM  9.1 08/22/2023 0752   ALKPHOS 49 08/22/2023 0752   AST 19 08/22/2023 0752   ALT 29 08/22/2023 0752   BILITOT 0.4  08/22/2023 0752       RADIOGRAPHIC STUDIES: CT CHEST ABDOMEN PELVIS W CONTRAST Result Date: 08/25/2023 CLINICAL DATA:  Small lymphocytic lymphoma restaging * Tracking Code: BO * EXAM: CT CHEST, ABDOMEN, AND PELVIS WITH CONTRAST TECHNIQUE: Multidetector CT imaging of the chest, abdomen and pelvis was performed following the standard protocol during bolus administration of intravenous contrast. RADIATION DOSE REDUCTION: This exam was performed according to the departmental dose-optimization program which includes automated exposure control, adjustment of the mA and/or kV according to patient size and/or use of iterative reconstruction technique. CONTRAST:  OMNIPAQUE  IOHEXOL  300 MG/ML  SOLN COMPARISON:  PET-CT 08/23/2022 FINDINGS: CT CHEST FINDINGS  Cardiovascular: Right Port-A-Cath tip: Cavoatrial junction. Coronary, aortic arch, and branch vessel atherosclerotic vascular disease. Mediastinum/Nodes: Stable masslike prominence to the left of the cricoid cartilage for example on image 4 series 2 in the vicinity of the cricothyroid and sternothyroid musculature potentially also involving the sternohyoid musculature, unchanged from prior exams including prior neck CT from 02/21/2022, no previous hypermetabolic activity, presumably representing a benign underlying lesion along the musculature. If this warrants further investigation, MRI of the neck with and without contrast could be utilized to further characterize the finding. 1.0 cm right lower paratracheal node on image 29 series 2, stable in size and not previously hypermetabolic on 08/23/2022. Small type 1 hiatal hernia.  Minimal gynecomastia. Lungs/Pleura: Unremarkable Musculoskeletal: Thoracic spondylosis. CT ABDOMEN PELVIS FINDINGS Hepatobiliary: 1.3 cm gallstone in the gallbladder. No appreciable gallbladder wall thickening or biliary dilatation. Pancreas: Unremarkable Spleen: Unremarkable Adrenals/Urinary Tract: Diffuse urinary bladder wall thickening is nonspecific may partially be due to nondistention, cystitis is not excluded. The kidneys and adrenal glands appear unremarkable. Stomach/Bowel: Descending and sigmoid colon diverticulosis. Vascular/Lymphatic: Atherosclerosis is present, including aortoiliac atherosclerotic disease. No pathologically enlarged adenopathy observed. Reproductive: Prostatomegaly. Prominent median lobe protrudes into the urinary bladder base. Other: No supplemental non-categorized findings. Musculoskeletal: Distal right iliopsoas lipoma, image 123 series 2. Postoperative findings at L1-2 without complicating feature. Lower lumbar spondylosis and degenerative disc disease contributing to multilevel impingement. Moderate degenerative left hip arthropathy. IMPRESSION: 1. No findings  of active lymphoma in the chest, abdomen, or pelvis. 2. Stable masslike prominence to the left of the cricoid cartilage in the vicinity of the cricothyroid and sternothyroid musculature potentially also involving the sternohyoid musculature, unchanged from prior exams including prior neck CT from 02/21/2022, no previous hypermetabolic activity, presumably representing a benign underlying lesion along the musculature. If this warrants further investigation, MRI of the neck with and without contrast could be utilized to further characterize the finding. 3. Cholelithiasis. 4. Diffuse urinary bladder wall thickening is nonspecific and may partially be due to nondistention, cystitis is not excluded. 5. Prostatomegaly. 6. Descending and sigmoid colon diverticulosis. 7. Small type 1 hiatal hernia. 8. Minimal gynecomastia. 9. Lower lumbar spondylosis and degenerative disc disease contributing to multilevel impingement. 10.  Aortic Atherosclerosis (ICD10-I70.0). Electronically Signed   By: Ryan Salvage M.D.   On: 08/25/2023 11:05     ASSESSMENT AND PLAN: This is a very pleasant 83 years old African-American male recently diagnosed with a small lymphocytic lymphoma with generalized lymphadenopathy in the neck, chest, axilla, abdomen and pelvis. The core biopsy of the left inguinal lymph node was consistent with the above diagnosis. The patient was seen recently with worsening anemia and thrombocytopenia.  He has imaging studies that showed mildly progressive lymphadenopathy in the right axilla, porta hepatis, retroperitoneum and pelvis but not the mediastinal lymphadenopathy. The patient was treated with a tapered dose of  prednisone  with no improvement in his anemia and thrombocytopenia. The patient definitely has indication now for treatment of his condition especially with the persistent refractory anemia and thrombocytopenia. The patient is currently on treatment with obinutuzumab  and venetoclax .  He is  status post 6 doses of obinutuzumab , last dose was given January 26, 2022.  He completed 1 year of treatment with venetoclax  400 mg p.o. daily in June 2024.  He has been in observation since that time. He had repeat CT scan of the chest, abdomen and pelvis performed recently.  I personally and independently reviewed the scan and discussed the result with the patient today.  His scan showed no concerning findings for disease recurrence. Assessment and Plan Assessment & Plan Small lymphocytic lymphoma, post-treatment, under surveillance Small lymphocytic lymphoma remains under control post-treatment. Last systemic treatment with obinutuzumab  ended in December 2023 and venetoclax  in June 2024. Currently under observation with no evidence of disease progression on recent CT scan of the chest, abdomen, and pelvis. Blood work is also stable. - Continue surveillance with blood work in six months.  Cough with nocturnal productive symptoms Reports nocturnal cough with mucus production. No associated chest pain, breathing issues, nausea, vomiting, or diarrhea. No clear seasonal allergy trigger identified.  Presence of implanted vascular access device (port), maintenance Implanted port in place. Requires regular maintenance to prevent complications. - Ensure port is flushed every two months. The patient was advised to call immediately if he has any other concerning symptoms in the interval. The patient voices understanding of current disease status and treatment options and is in agreement with the current care plan.  All questions were answered. The patient knows to call the clinic with any problems, questions or concerns. We can certainly see the patient much sooner if necessary.  The total time spent in the appointment was 30 minutes.  Disclaimer: This note was dictated with voice recognition software. Similar sounding words can inadvertently be transcribed and may not be corrected upon review.

## 2023-09-08 ENCOUNTER — Other Ambulatory Visit: Payer: Self-pay | Admitting: Physician Assistant

## 2023-09-11 DIAGNOSIS — J40 Bronchitis, not specified as acute or chronic: Secondary | ICD-10-CM | POA: Diagnosis not present

## 2023-09-11 DIAGNOSIS — Z03818 Encounter for observation for suspected exposure to other biological agents ruled out: Secondary | ICD-10-CM | POA: Diagnosis not present

## 2023-09-11 DIAGNOSIS — R059 Cough, unspecified: Secondary | ICD-10-CM | POA: Diagnosis not present

## 2023-10-23 ENCOUNTER — Other Ambulatory Visit: Payer: Self-pay | Admitting: Internal Medicine

## 2023-10-24 ENCOUNTER — Inpatient Hospital Stay: Attending: Physician Assistant

## 2023-10-24 DIAGNOSIS — Z452 Encounter for adjustment and management of vascular access device: Secondary | ICD-10-CM | POA: Insufficient documentation

## 2023-10-24 DIAGNOSIS — C8308 Small cell B-cell lymphoma, lymph nodes of multiple sites: Secondary | ICD-10-CM | POA: Diagnosis not present

## 2023-11-02 ENCOUNTER — Other Ambulatory Visit: Payer: Self-pay | Admitting: Physician Assistant

## 2023-11-02 DIAGNOSIS — E1169 Type 2 diabetes mellitus with other specified complication: Secondary | ICD-10-CM | POA: Diagnosis not present

## 2023-11-02 DIAGNOSIS — E7849 Other hyperlipidemia: Secondary | ICD-10-CM | POA: Diagnosis not present

## 2023-11-02 DIAGNOSIS — E785 Hyperlipidemia, unspecified: Secondary | ICD-10-CM | POA: Diagnosis not present

## 2023-11-02 DIAGNOSIS — J439 Emphysema, unspecified: Secondary | ICD-10-CM | POA: Diagnosis not present

## 2023-11-02 DIAGNOSIS — Z008 Encounter for other general examination: Secondary | ICD-10-CM | POA: Diagnosis not present

## 2023-11-02 DIAGNOSIS — I10 Essential (primary) hypertension: Secondary | ICD-10-CM | POA: Diagnosis not present

## 2023-11-02 DIAGNOSIS — I2510 Atherosclerotic heart disease of native coronary artery without angina pectoris: Secondary | ICD-10-CM | POA: Diagnosis not present

## 2023-12-03 ENCOUNTER — Other Ambulatory Visit: Payer: Self-pay | Admitting: Internal Medicine

## 2023-12-06 ENCOUNTER — Other Ambulatory Visit: Payer: Self-pay | Admitting: Physician Assistant

## 2023-12-06 DIAGNOSIS — C8300 Small cell B-cell lymphoma, unspecified site: Secondary | ICD-10-CM

## 2023-12-24 ENCOUNTER — Inpatient Hospital Stay: Attending: Physician Assistant

## 2024-02-13 ENCOUNTER — Encounter: Payer: Self-pay | Admitting: Internal Medicine

## 2024-02-20 ENCOUNTER — Inpatient Hospital Stay: Admitting: Internal Medicine

## 2024-02-20 ENCOUNTER — Inpatient Hospital Stay: Attending: Physician Assistant

## 2024-02-20 VITALS — BP 121/66 | HR 63 | Temp 98.2°F | Resp 17 | Ht 71.0 in | Wt 208.0 lb

## 2024-02-20 DIAGNOSIS — C8300 Small cell B-cell lymphoma, unspecified site: Secondary | ICD-10-CM

## 2024-02-20 LAB — CBC WITH DIFFERENTIAL (CANCER CENTER ONLY)
Abs Immature Granulocytes: 0.02 K/uL (ref 0.00–0.07)
Basophils Absolute: 0.1 K/uL (ref 0.0–0.1)
Basophils Relative: 1 %
Eosinophils Absolute: 0.3 K/uL (ref 0.0–0.5)
Eosinophils Relative: 5 %
HCT: 42 % (ref 39.0–52.0)
Hemoglobin: 14 g/dL (ref 13.0–17.0)
Immature Granulocytes: 0 %
Lymphocytes Relative: 48 %
Lymphs Abs: 3.5 K/uL (ref 0.7–4.0)
MCH: 28.4 pg (ref 26.0–34.0)
MCHC: 33.3 g/dL (ref 30.0–36.0)
MCV: 85.2 fL (ref 80.0–100.0)
Monocytes Absolute: 0.5 K/uL (ref 0.1–1.0)
Monocytes Relative: 7 %
Neutro Abs: 2.7 K/uL (ref 1.7–7.7)
Neutrophils Relative %: 39 %
Platelet Count: 246 K/uL (ref 150–400)
RBC: 4.93 MIL/uL (ref 4.22–5.81)
RDW: 14.5 % (ref 11.5–15.5)
WBC Count: 7.1 K/uL (ref 4.0–10.5)
nRBC: 0 % (ref 0.0–0.2)

## 2024-02-20 LAB — CMP (CANCER CENTER ONLY)
ALT: 30 U/L (ref 0–44)
AST: 20 U/L (ref 15–41)
Albumin: 4.2 g/dL (ref 3.5–5.0)
Alkaline Phosphatase: 66 U/L (ref 38–126)
Anion gap: 10 (ref 5–15)
BUN: 20 mg/dL (ref 8–23)
CO2: 26 mmol/L (ref 22–32)
Calcium: 9.2 mg/dL (ref 8.9–10.3)
Chloride: 104 mmol/L (ref 98–111)
Creatinine: 0.93 mg/dL (ref 0.61–1.24)
GFR, Estimated: >60 mL/min
Glucose, Bld: 113 mg/dL — ABNORMAL HIGH (ref 70–99)
Potassium: 4.2 mmol/L (ref 3.5–5.1)
Sodium: 140 mmol/L (ref 135–145)
Total Bilirubin: 0.3 mg/dL (ref 0.0–1.2)
Total Protein: 7.4 g/dL (ref 6.5–8.1)

## 2024-02-20 LAB — LACTATE DEHYDROGENASE: LDH: 190 U/L (ref 105–235)

## 2024-02-20 NOTE — Progress Notes (Signed)
 "     [facility] Telephone:([phone]) [phone]   [phone]) [phone]  OFFICE PROGRESS NOTE  Joseph Ingle, MD 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  PRIOR THERAPY:  1) A tapered dose of prednisone  but his anemia and thrombocytopenia were refractory to the steroids. 2) Systemic treatment with obinutuzumab  every 4 weeks for 6 months.  In addition the patient will be treatment with venetoclax  initially with a starting dose to be increased gradually to the standard dose of 400 mg p.o. daily for a total of 1 year.  He is status post 12 cycles.  Last dose of obinutuzumab  was given on January 26, 2022.  CURRENT THERAPY: Observation.  INTERVAL HISTORY: Joseph Moyer 84 y.o. male returns to the clinic today for 34-month follow-up visit. Discussed the use of AI scribe software for clinical note transcription with the patient, who gave verbal consent to proceed.  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  He completed a fixed dose course of venetoclax  for small lymphocytic lymphoma in June 2024 and has been under observation since.  Over the past six months, he has not experienced new symptoms. He denies weight loss, chest pain, dyspnea, headache, or visual changes. He endorses occasional night sweats, describing them as occurring intermittently.  He has not required blood transfusions. No new concerns or symptoms were discussed during the visit.      MEDICAL HISTORY: Past Medical History:  Diagnosis Date   Allergy    Arthritis    CAD in native artery    COPD (chronic obstructive pulmonary disease) (HCC)    Frequent  PVCs    GERD (gastroesophageal reflux disease)    Hyperlipidemia    Hypertension    Mild carotid artery disease    Small lymphocytic lymphoma (HCC)     ALLERGIES:  is allergic to obinutuzumab , oxycodone , ramipril, and rosuvastatin .  MEDICATIONS:  Current Outpatient Medications  Medication Sig Dispense Refill   amLODipine  (NORVASC ) 5 MG tablet Take 5 mg by mouth daily.     atorvastatin  (LIPITOR) 40 MG tablet TAKE 1 TABLET BY MOUTH EVERY DAY 15 tablet 0   diphenhydrAMINE  HCl, Sleep, 50 MG CAPS Take 50 mg by mouth at bedtime.     FeFum-FePoly-FA-B Cmp-C-Biot (INTEGRA PLUS ) CAPS Take 1 capsule by mouth daily. 30 capsule 3   finasteride (PROSCAR) 5 MG tablet Take 5 mg by mouth daily.     lidocaine -prilocaine  (EMLA ) cream Apply 1 Application topically as needed. 1-2 hours prior to treatment, apply a tsp of the numbing cream to the skin over the port site and cover with cling wrap or other wrap. Do not rub in the cream. Do not use on the area until the port site is healed . 30 g 0   losartan  (COZAAR ) 25 MG tablet Take 1 tablet (25 mg total) by mouth daily. 90 tablet 1   meloxicam  (MOBIC ) 15 MG tablet Take 15 mg by mouth daily as needed.     methocarbamol  (ROBAXIN ) 750 MG tablet Take 750 mg by mouth every 6 (six) hours as needed.     metoprolol  succinate (TOPROL -XL) 25 MG 24 hr tablet Take 1 tablet (25 mg total) by mouth daily. PT. MUST MAKE AN APPOINTMENT  IN ORDER TO RECEIVE FUTURE REFILLS. THIRD AND FINAL ATTEMPT. 7 tablet 0   Naphazoline-Glycerin (CLEAR EYES REDNESS RELIEF OP) Place 1 drop into both eyes as needed.     omeprazole  (PRILOSEC) 20 MG capsule TAKE 1 CAPSULE BY MOUTH EVERY DAY 90 capsule 0   prochlorperazine  (COMPAZINE ) 10 MG tablet TAKE 1 TABLET BY MOUTH EVERY 6 HOURS AS NEEDED FOR NAUSEA OR VOMITING. 30 tablet 0   triamcinolone  cream (KENALOG ) 0.1 % APPLY TO AFFECTED AREA TWICE A DAY 30 g 2   umeclidinium bromide  (INCRUSE ELLIPTA ) 62.5 MCG/INH AEPB Inhale 1 puff into the lungs  daily. 60 each 5   No current facility-administered medications for this visit.    SURGICAL HISTORY:  Past Surgical History:  Procedure Laterality Date   BRONCHIAL NEEDLE ASPIRATION BIOPSY  03/12/2020   Procedure: BRONCHIAL NEEDLE ASPIRATION BIOPSIES;  Surgeon: Joseph Adine CROME, DO;  Location: [facility];  Service: Pulmonary;;   CIRCUMCISION     COLONOSCOPY WITH PROPOFOL  N/A 05/10/2015   Procedure: COLONOSCOPY WITH PROPOFOL ;  Surgeon: Joseph MARLA Louder, MD;  Location: [facility];  Service: Endoscopy;  Laterality: N/A;   DECOMPRESSIVE LUMBAR LAMINECTOMY LEVEL 1 N/A 06/15/2018   Procedure: Joseph Moyer decompression L1-L2/ complete inferior facetectomyof L1. Posterior lateral arthrodesiswith autograft bone L1-L2.;  Surgeon: Joseph Aures, MD;  Location: [facility] OR;  Service: Orthopedics;  Laterality: N/A;   HEMORRHOID SURGERY     IR IMAGING GUIDED PORT INSERTION  09/29/2021   VIDEO BRONCHOSCOPY WITH ENDOBRONCHIAL ULTRASOUND N/A 03/12/2020   Procedure: VIDEO BRONCHOSCOPY WITH ENDOBRONCHIAL ULTRASOUND;  Surgeon: Joseph Adine CROME, DO;  Location: [facility];  Service: Pulmonary;  Laterality: N/A;    REVIEW OF SYSTEMS:  A comprehensive review of systems was negative.   PHYSICAL EXAMINATION: General appearance: alert, cooperative, and no distress Head: Normocephalic, without obvious abnormality, atraumatic Neck: no adenopathy, no JVD, supple, symmetrical, trachea midline, and thyroid  not enlarged, symmetric, no tenderness/mass/nodules Lymph nodes: Cervical, supraclavicular, and axillary nodes normal. Resp: clear to auscultation bilaterally Back: symmetric, no curvature. ROM normal. No CVA tenderness. Cardio: regular rate and rhythm, S1, S2 normal, no murmur, click, rub or gallop GI: soft, non-tender; bowel sounds normal; no masses,  no organomegaly Extremities: extremities normal, atraumatic, no cyanosis or edema  ECOG PERFORMANCE STATUS: 0 - Asymptomatic  Blood pressure 121/66, pulse 63, temperature  98.2 F (36.8 C), temperature source Temporal, resp. rate 17, height 5' 11 (1.803 m), weight 208 lb (94.3 kg), SpO2 100%.  LABORATORY DATA: Lab Results  Component Value Date   WBC 7.1 02/20/2024   HGB 14.0 02/20/2024   HCT 42.0 02/20/2024   MCV 85.2 02/20/2024   PLT 246 02/20/2024      Chemistry      Component Value Date/Time   NA 140 02/20/2024 0837   NA 139 12/05/2021 1007   K 4.2 02/20/2024 0837   CL 104 02/20/2024 0837   CO2 26 02/20/2024 0837   BUN 20 02/20/2024 0837   BUN 16 12/05/2021 1007   CREATININE 0.93 02/20/2024 0837      Component Value Date/Time   CALCIUM  9.2 02/20/2024 0837   ALKPHOS 66 02/20/2024 0837   AST 20 02/20/2024 0837   ALT 30 02/20/2024 0837   BILITOT 0.3 02/20/2024 0837       RADIOGRAPHIC STUDIES: No results found.    ASSESSMENT AND PLAN: This is a very pleasant 84 years old African-American male recently diagnosed with a small lymphocytic lymphoma with generalized lymphadenopathy in the neck, chest, axilla, abdomen and pelvis. The  core biopsy of the left inguinal lymph node was consistent with the above diagnosis. The patient was seen recently with worsening anemia and thrombocytopenia.  He has imaging studies that showed mildly progressive lymphadenopathy in the right axilla, porta hepatis, retroperitoneum and pelvis but not the mediastinal lymphadenopathy. The patient was treated with a tapered dose of prednisone  with no improvement in his anemia and thrombocytopenia. The patient definitely has indication now for treatment of his condition especially with the persistent refractory anemia and thrombocytopenia. The patient is currently on treatment with obinutuzumab  and venetoclax .  He is status post 6 doses of obinutuzumab , last dose was given January 26, 2022.  He completed 1 year of treatment with venetoclax  400 mg p.o. daily in June 2024.  He has been in observation since that time. He had repeat CBC, comprehensive metabolic panel and  LDH performed earlier today.  No concerning findings on his blood work. Assessment and Plan Assessment & Plan Small lymphocytic lymphoma (chronic lymphocytic leukemia) He has completed fixed-dose venetoclax  in June 2024 and is under ongoing surveillance. He is asymptomatic except for occasional night sweats, without weight loss, chest pain, or other B symptoms. Recent laboratory studies are unremarkable. - Continued observation with follow-up every six months. - Ordered laboratory studies and imaging one week prior to next visit. - Scheduled next follow-up in six months. He was advised to call immediately if he has any other concerning symptoms in the interval.  The patient voices understanding of current disease status and treatment options and is in agreement with the current care plan.  All questions were answered. The patient knows to call the clinic with any problems, questions or concerns. We can certainly see the patient much sooner if necessary.  The total time spent in the appointment was 20 minutes.  Disclaimer: This note was dictated with voice recognition software. Similar sounding words can inadvertently be transcribed and may not be corrected upon review.       "

## 2024-08-11 ENCOUNTER — Inpatient Hospital Stay: Attending: Physician Assistant

## 2024-08-19 ENCOUNTER — Inpatient Hospital Stay: Admitting: Internal Medicine
